# Patient Record
Sex: Male | Born: 1945
Health system: Southern US, Community
[De-identification: ages and names within clinical notes are randomized; demographics above are authoritative.]

## PROBLEM LIST (undated history)

## (undated) DIAGNOSIS — E785 Hyperlipidemia, unspecified: Secondary | ICD-10-CM

## (undated) DIAGNOSIS — W19XXXA Unspecified fall, initial encounter: Secondary | ICD-10-CM

## (undated) DIAGNOSIS — K922 Gastrointestinal hemorrhage, unspecified: Secondary | ICD-10-CM

## (undated) DIAGNOSIS — Z972 Presence of dental prosthetic device (complete) (partial): Secondary | ICD-10-CM

## (undated) DIAGNOSIS — J449 Chronic obstructive pulmonary disease, unspecified: Secondary | ICD-10-CM

## (undated) DIAGNOSIS — I5189 Other ill-defined heart diseases: Secondary | ICD-10-CM

## (undated) DIAGNOSIS — S2509XA Other specified injury of thoracic aorta, initial encounter: Secondary | ICD-10-CM

## (undated) DIAGNOSIS — K851 Biliary acute pancreatitis without necrosis or infection: Secondary | ICD-10-CM

## (undated) DIAGNOSIS — F329 Major depressive disorder, single episode, unspecified: Secondary | ICD-10-CM

## (undated) DIAGNOSIS — C4491 Basal cell carcinoma of skin, unspecified: Secondary | ICD-10-CM

## (undated) DIAGNOSIS — N529 Male erectile dysfunction, unspecified: Secondary | ICD-10-CM

## (undated) DIAGNOSIS — E669 Obesity, unspecified: Secondary | ICD-10-CM

## (undated) DIAGNOSIS — E119 Type 2 diabetes mellitus without complications: Secondary | ICD-10-CM

## (undated) DIAGNOSIS — I10 Essential (primary) hypertension: Secondary | ICD-10-CM

## (undated) DIAGNOSIS — S52132A Displaced fracture of neck of left radius, initial encounter for closed fracture: Secondary | ICD-10-CM

## (undated) DIAGNOSIS — I2510 Atherosclerotic heart disease of native coronary artery without angina pectoris: Secondary | ICD-10-CM

## (undated) DIAGNOSIS — I739 Peripheral vascular disease, unspecified: Secondary | ICD-10-CM

## (undated) DIAGNOSIS — S72002A Fracture of unspecified part of neck of left femur, initial encounter for closed fracture: Secondary | ICD-10-CM

## (undated) DIAGNOSIS — Z87448 Personal history of other diseases of urinary system: Secondary | ICD-10-CM

## (undated) DIAGNOSIS — K259 Gastric ulcer, unspecified as acute or chronic, without hemorrhage or perforation: Secondary | ICD-10-CM

## (undated) DIAGNOSIS — F32A Depression, unspecified: Secondary | ICD-10-CM

## (undated) HISTORY — DX: Basal cell carcinoma of skin, unspecified: C44.91

## (undated) HISTORY — DX: Gastrointestinal hemorrhage, unspecified: K92.2

## (undated) HISTORY — DX: Depression, unspecified: F32.A

## (undated) HISTORY — DX: Male erectile dysfunction, unspecified: N52.9

## (undated) HISTORY — DX: Obesity, unspecified: E66.9

## (undated) HISTORY — DX: Essential (primary) hypertension: I10

## (undated) HISTORY — DX: Peripheral vascular disease, unspecified: I73.9

## (undated) HISTORY — DX: Other specified injury of thoracic aorta, initial encounter: S25.09XA

## (undated) HISTORY — PX: CHOLECYSTECTOMY: SHX55

## (undated) HISTORY — PX: CARDIOVASCULAR SURGERY: SHX460

## (undated) HISTORY — DX: Biliary acute pancreatitis without necrosis or infection: K85.10

## (undated) HISTORY — DX: Displaced fracture of neck of left radius, initial encounter for closed fracture: S52.132A

## (undated) HISTORY — DX: Major depressive disorder, single episode, unspecified: F32.9

## (undated) HISTORY — DX: Hyperlipidemia, unspecified: E78.5

## (undated) HISTORY — PX: TRACHEOSTOMY: SUR1362

## (undated) HISTORY — DX: Fracture of unspecified part of neck of left femur, initial encounter for closed fracture: S72.002A

## (undated) HISTORY — DX: Unspecified fall, initial encounter: W19.XXXA

## (undated) HISTORY — DX: Other ill-defined heart diseases: I51.89

## (undated) HISTORY — DX: Personal history of other diseases of urinary system: Z87.448

## (undated) HISTORY — PX: KNEE SURGERY: SHX244

## (undated) HISTORY — DX: Type 2 diabetes mellitus without complications: E11.9

## (undated) HISTORY — DX: Atherosclerotic heart disease of native coronary artery without angina pectoris: I25.10

## (undated) HISTORY — PX: THORACOTOMY: SUR1349

---

## 1990-11-30 DIAGNOSIS — I2510 Atherosclerotic heart disease of native coronary artery without angina pectoris: Secondary | ICD-10-CM | POA: Diagnosis present

## 1990-11-30 HISTORY — DX: Atherosclerotic heart disease of native coronary artery without angina pectoris: I25.10

## 1991-08-31 HISTORY — PX: CARDIAC CATHETERIZATION: SHX172

## 2003-12-01 DIAGNOSIS — W19XXXA Unspecified fall, initial encounter: Secondary | ICD-10-CM

## 2003-12-01 DIAGNOSIS — S2509XA Other specified injury of thoracic aorta, initial encounter: Secondary | ICD-10-CM

## 2003-12-01 HISTORY — DX: Unspecified fall, initial encounter: W19.XXXA

## 2003-12-01 HISTORY — DX: Other specified injury of thoracic aorta, initial encounter: S25.09XA

## 2005-10-02 ENCOUNTER — Encounter: Payer: Self-pay | Admitting: Family Medicine

## 2005-10-07 ENCOUNTER — Encounter: Payer: Self-pay | Admitting: Family Medicine

## 2005-11-18 ENCOUNTER — Encounter: Payer: Self-pay | Admitting: Family Medicine

## 2008-05-12 ENCOUNTER — Other Ambulatory Visit: Payer: Self-pay

## 2008-05-12 ENCOUNTER — Inpatient Hospital Stay: Payer: Self-pay | Admitting: Vascular Surgery

## 2008-08-16 ENCOUNTER — Ambulatory Visit: Payer: Self-pay | Admitting: Family Medicine

## 2008-08-16 DIAGNOSIS — I2510 Atherosclerotic heart disease of native coronary artery without angina pectoris: Secondary | ICD-10-CM | POA: Insufficient documentation

## 2008-08-16 DIAGNOSIS — E785 Hyperlipidemia, unspecified: Secondary | ICD-10-CM | POA: Insufficient documentation

## 2008-08-16 DIAGNOSIS — F3340 Major depressive disorder, recurrent, in remission, unspecified: Secondary | ICD-10-CM | POA: Insufficient documentation

## 2008-08-16 DIAGNOSIS — F528 Other sexual dysfunction not due to a substance or known physiological condition: Secondary | ICD-10-CM | POA: Insufficient documentation

## 2008-08-16 DIAGNOSIS — I10 Essential (primary) hypertension: Secondary | ICD-10-CM | POA: Insufficient documentation

## 2008-08-17 LAB — CONVERTED CEMR LAB
ALT: 23 U/L
AST: 20 U/L
Albumin: 4.7 g/dL
Alkaline Phosphatase: 66 U/L
BUN: 13 mg/dL
Bilirubin, Direct: 0.2 mg/dL
CO2: 31 meq/L
Calcium: 9.8 mg/dL
Chloride: 102 meq/L
Cholesterol: 240 mg/dL
Creatinine, Ser: 1.1 mg/dL
Direct LDL: 181.7 mg/dL
GFR calc Af Amer: 88 mL/min
GFR calc non Af Amer: 72 mL/min
Glucose, Bld: 131 mg/dL — ABNORMAL HIGH
HDL: 40.8 mg/dL
PSA: 1.32 ng/mL
Potassium: 4.3 meq/L
Sodium: 139 meq/L
Total Bilirubin: 1.6 mg/dL — ABNORMAL HIGH
Total CHOL/HDL Ratio: 5.9
Total Protein: 7.8 g/dL
Triglycerides: 174 mg/dL — ABNORMAL HIGH
VLDL: 35 mg/dL

## 2008-09-14 ENCOUNTER — Telehealth (INDEPENDENT_AMBULATORY_CARE_PROVIDER_SITE_OTHER): Payer: Self-pay | Admitting: *Deleted

## 2010-01-23 ENCOUNTER — Telehealth (INDEPENDENT_AMBULATORY_CARE_PROVIDER_SITE_OTHER): Payer: Self-pay | Admitting: *Deleted

## 2010-02-07 ENCOUNTER — Ambulatory Visit: Payer: Self-pay | Admitting: Family Medicine

## 2010-02-10 LAB — CONVERTED CEMR LAB
ALT: 29 U/L
AST: 22 U/L
Albumin: 4.8 g/dL
Alkaline Phosphatase: 69 U/L
BUN: 18 mg/dL
Basophils Absolute: 0 10*3/uL
Basophils Relative: 0.7 %
Bilirubin, Direct: 0.1 mg/dL
CO2: 30 meq/L
Calcium: 9.8 mg/dL
Chloride: 106 meq/L
Cholesterol: 212 mg/dL — ABNORMAL HIGH
Creatinine, Ser: 1 mg/dL
Direct LDL: 160.4 mg/dL
Eosinophils Absolute: 0.1 10*3/uL
Eosinophils Relative: 2.5 %
GFR calc non Af Amer: 80.29 mL/min
Glucose, Bld: 168 mg/dL — ABNORMAL HIGH
HCT: 50.1 %
HDL: 37.6 mg/dL — ABNORMAL LOW
Hemoglobin: 17.1 g/dL — ABNORMAL HIGH
Lymphocytes Relative: 25.9 %
Lymphs Abs: 1.3 10*3/uL
MCHC: 34.2 g/dL
MCV: 89.5 fL
Monocytes Absolute: 0.5 10*3/uL
Monocytes Relative: 9.1 %
Neutro Abs: 3.2 10*3/uL
Neutrophils Relative %: 61.8 %
PSA: 1.17 ng/mL
Platelets: 207 10*3/uL
Potassium: 4.3 meq/L
RBC: 5.6 M/uL
RDW: 12.2 %
Sodium: 141 meq/L
Total Bilirubin: 1.2 mg/dL
Total CHOL/HDL Ratio: 6
Total Protein: 8 g/dL
Triglycerides: 92 mg/dL
VLDL: 18.4 mg/dL
WBC: 5.1 10*3/uL

## 2010-02-13 ENCOUNTER — Ambulatory Visit: Payer: Self-pay | Admitting: Family Medicine

## 2010-02-13 DIAGNOSIS — E1151 Type 2 diabetes mellitus with diabetic peripheral angiopathy without gangrene: Secondary | ICD-10-CM | POA: Insufficient documentation

## 2010-02-17 ENCOUNTER — Encounter: Payer: Self-pay | Admitting: Family Medicine

## 2010-02-20 ENCOUNTER — Encounter (INDEPENDENT_AMBULATORY_CARE_PROVIDER_SITE_OTHER): Payer: Self-pay | Admitting: *Deleted

## 2010-03-17 ENCOUNTER — Ambulatory Visit: Payer: Self-pay | Admitting: Family Medicine

## 2010-03-17 ENCOUNTER — Telehealth: Payer: Self-pay | Admitting: Family Medicine

## 2010-12-30 NOTE — Letter (Signed)
Summary: [facility]: Immunoassay Fecal Occult Blood (iFOB) Order Form  [facility]  [address] [place], [place] [address]   Phone: [phone]  Fax: [phone]      [facility]: Immunoassay Fecal Occult Blood (iFOB) Order Form   February 07, 2010 MRN: [id number]   [facility] Feb 15, 1946   Physicican Name:________Spencer Copland_________________  Diagnosis Code:________v76.49__________________      Hannah Beat MD

## 2010-12-30 NOTE — Assessment & Plan Note (Signed)
Summary: 30 MINUTE F/U PER DR. Nikelle Malatesta/NT   Vital Signs:  Patient profile:   65 year old male Height:      68.25 inches Weight:      230 pounds BMI:     34.84 Temp:     98 degrees F oral Pulse rate:   76 / minute Pulse rhythm:   regular BP sitting:   130 / 86 Cuff size:   regular  History of Present Illness: Chief complaint Diabetes.Heather M Woodard CMA  LDL, 160: on gem  DM: wife has type two diabetes. New onset DM, reviewed with him his recent labs.  Diabetes Management History:      He has not been enrolled in the "Diabetic Education Program".  He states understanding of dietary principles but he is not following the appropriate diet.  No sensory loss is reported.  Self foot exams are not being performed.  He is checking home blood sugars.  He says that he is not exercising regularly.        Hypoglycemic symptoms are not occurring.  No hyperglycemic symptoms are reported.        The following changes have been made to his treatment plan since last visit: diet changes, medication changes, and exercise program.  Treatment plan changes were initiated by patient and initiated by MD.    Current Problems (verified): 1)  Diabetes Mellitus, Type II  (ICD-250.00) 2)  Health Maintenance Exam  (ICD-V70.0) 3)  Special Screening Malignant Neoplasm of Prostate  (ICD-V76.44) 4)  Erectile Dysfunction  (ICD-302.72) 5)  Special Screening Malignant Neoplasm of Prostate  (ICD-V76.44) 6)  Encounter For Long-term Use of Other Medications  (ICD-V58.69) 7)  Family History Diabetes 1st Degree Relative  (ICD-V18.0) 8)  Hypertension  (ICD-401.9) 9)  Hyperlipidemia  (ICD-272.4) 10)  Depression  (ICD-311) 11)  Coronary Artery Disease  (ICD-414.00)  Allergies: 1)  ! Penicillin  Past History:  Past medical, surgical, family and social histories (including risk factors) reviewed, and no changes noted (except as noted below).  Past Medical History: Coronary artery disease, MI 1992, Acute anterior  MI, thrombolytic therapy. 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 Cholecystectomy, emergently Aorta, Cardiovascular Surgery with ruptured Aorta, Dr. Meyer Russel, [facility] R knee surgery s/p L Thoracotomy, thoracic aorta repair s/p Tracheostomy  Cardiac Catheterization, 08/1991, 50% mid-LAD stenosis with clot, 25-50% second marginal. Further stress tests, cardiolites neg. Stress Echo 09/2005, negative for ischemia [facility])   Dr. Corena Herter new partner did Gallbladder  Family History: Reviewed history from 08/16/2008 and no changes required. Family History Diabetes 1st degree relative Family History High cholesterol Family History Hypertension  Distanct CAD  Mom - 30, dementia Father, MI, 59  Brother, d/c 28, DM Brother, has had an MI  Social History: Reviewed history from 08/16/2008 and no changes required. 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 GI: No n/v/d Pulm: No SOB, cough, wheezing Interactive and getting along well at home.  Otherwise, ROS is as per the HPI.   Physical Exam  General:  Well-developed,well-nourished,in no acute distress; alert,appropriate and cooperative throughout examination Head:  Normocephalic and atraumatic without obvious abnormalities. No apparent alopecia or balding. Mouth:  Oral mucosa and oropharynx without lesions or exudates.   Teeth in good repair. Lungs:  Normal respiratory effort, chest expands symmetrically. Lungs are clear to auscultation, no crackles or wheezes. Heart:  Normal rate and regular rhythm. S1 and S2 normal without gallop, murmur, click, rub or other extra sounds. Psych:  Cognition and judgment appear intact. Alert and cooperative with normal attention span and concentration. No apparent delusions, illusions, hallucinations   Impression & Recommendations:  Problem # 1:  DIABETES MELLITUS, TYPE II (ICD-250.00) Assessment New New onset DM No symptoms Cont with Metformin, reviewed DM in detail  The following medications were removed from the medication list:    Lisinopril-hydrochlorothiazide 20-12.5 Mg Tabs (Lisinopril-hydrochlorothiazide) .[name] Kitchen... 1 by mouth daily His updated medication list for this problem includes:    Adprin B 325 Mg Tabs (Aspirin buf(cacarb-mgcarb-mgo)) .[name] Kitchen... Take one tablet daily    Lisinopril-hydrochlorothiazide 20-12.5 Mg Tabs (Lisinopril-hydrochlorothiazide) .[name] Kitchen... 2 tabs by mouth daily    Metformin Hcl 500 Mg Xr24h-tab (Metformin hcl) .[name] Kitchen... 1 by mouth daily  Problem # 2:  HYPERLIPIDEMIA (ICD-272.4) Assessment: Deteriorated not at goal, rechallenge with Pravachol  His updated medication list for this problem includes:    Gemfibrozil 600 Mg Tabs (Gemfibrozil) .[name] Kitchen... Take one tablet two times a day    Pravastatin Sodium 40 Mg Tabs (Pravastatin sodium) .[name] Kitchen... 1 by mouth at bedtime  Labs Reviewed: SGOT: 22 (02/07/2010)   SGPT: 29 (02/07/2010)   HDL:37.60 (02/07/2010), 40.8 (08/16/2008)  LDL:DEL (08/16/2008)  Chol:212 (02/07/2010), 240 (08/16/2008)  Trig:92.0 (02/07/2010), 174 (08/16/2008)  Complete Medication List: 1)  Citalopram Hydrobromide 20 Mg Tabs (Citalopram hydrobromide) .... Take one tablet daily 2)  Amlodipine Besylate 10 Mg Tabs (Amlodipine besylate) .... Once daily 3)  Adprin B 325 Mg Tabs (Aspirin buf(cacarb-mgcarb-mgo)) .... Take one tablet daily 4)  Levitra  20 Mg Tabs (Vardenafil hcl) .[name] Kitchen.. 1 by mouth 30 minutes before intercourse 5)  Gemfibrozil 600 Mg Tabs (Gemfibrozil) .... Take one tablet two times a day 6)  Lisinopril-hydrochlorothiazide 20-12.5 Mg Tabs (Lisinopril-hydrochlorothiazide) .... 2 tabs by mouth daily 7)  Pravastatin Sodium 40 Mg Tabs (Pravastatin sodium) .[name] Kitchen.. 1 by mouth at bedtime 8)  Metformin Hcl 500 Mg Xr24h-tab (Metformin hcl) .[name] Kitchen.. 1 by mouth daily  Diabetes Management Assessment/Plan:      The following lipid goals have been established for the patient: Total cholesterol goal of 200; LDL cholesterol goal of 100; HDL cholesterol goal of 40; Triglyceride goal of 200.    Patient Instructions: 1)  f/u 3 months, labs before 2)  code = 250.00 for all 3)  Hepatic Panel prior to visit ICD-9:  4)  Lipid panel prior to visit ICD-9 :  5)  HgBA1c prior to visit  ICD-9:  6)  Urine Microalbumin prior to visit ICD-9 :  Prescriptions: PRAVASTATIN SODIUM 40 MG TABS (PRAVASTATIN SODIUM) 1 by mouth at bedtime  #30 x 11   Entered and Authorized by:   Hannah Beat MD   Signed by:   Hannah Beat MD on 02/13/2010   Method used:   Electronically to        [facility]* (retail)       [address]  [place], [place]  [address]       Ph: [phone]       Fax: [phone]   RxID:   [id number] LISINOPRIL-HYDROCHLOROTHIAZIDE 20-12.5 MG TABS (LISINOPRIL-HYDROCHLOROTHIAZIDE) 2 tabs by mouth daily  #60 x 3   Entered and Authorized by:   Hannah Beat MD   Signed by:   Hannah Beat MD on 02/13/2010   Method used:   Print then Give to Patient   RxID:   [id number]   Current Allergies (reviewed today): ! PENICILLIN

## 2010-12-30 NOTE — Progress Notes (Signed)
  Phone Note From Other Clinic   Caller: [facility] Call For: [name] Summary of Call: ifob test never received, test canceled. Initial call taken by: Mills Koller,  March 17, 2010 2:28 PM

## 2010-12-30 NOTE — Consult Note (Signed)
 Summary: [facility] - ANNUAL EXAM / DR. ANDREW LAMB  [facility] - ANNUAL EXAM / DR. ANDREW LAMB   Imported By: Rock Nottingham 08/16/2008 16:34:19  _____________________________________________________________________  External Attachment:    Type:   Image     Comment:   External Document

## 2010-12-30 NOTE — Progress Notes (Signed)
Summary: refill request for norvasc denied- needs office visit  Phone Note Outgoing Call   Summary of Call: Refill request for norvasc denied to [facility] pharmacy, pt has not been seen in over a year. Initial call taken by: Lowella Petties CMA,  January 23, 2010 10:13 AM

## 2010-12-30 NOTE — Miscellaneous (Signed)
  Clinical Lists Changes        Diabetic Eye Exam Date:  02/20/2010 Diabetes Eye Exam Due:  1 yr

## 2010-12-30 NOTE — Letter (Signed)
[facility]   Imported By: Lanelle Bal 02/20/2010 12:50:11  _____________________________________________________________________  External Attachment:    Type:   Image     Comment:   External Document  Appended Document: [facility] document DM eye exam today  Appended Document: [facility] done and to repeat in 1 year

## 2010-12-30 NOTE — Assessment & Plan Note (Signed)
 Summary: TO EST/CPX/HEA   Vital Signs:  Patient Profile:   65 Years Old Male Height:     70 inches (177.80 cm) Weight:      541.38 pounds (246.08 kg) Temp:     98.3 degrees F (36.83 degrees C) oral Pulse rate:   60 / minute Pulse rhythm:   regular BP sitting:   150 / 90  (left arm) Cuff size:   large  Vitals Entered By: Roxanne Ruth (August 16, 2008 9:00 AM)                 Chief Complaint:  New pt/physical.  History of Present Illness: To establish Care today in our office. He has not had a physical in 2-3 years.  1. HTN: 150/90 today. Taking a full dose aspirin . medications are listed below. He has been on them since approximately mid 1990s. He reports compliance and no complications.  2. ED: patient does have difficulty keeping and maintaining an erection. He is not able to achieve penetration. He does occasionally have erections, but these are not maintained or satisfactory.  3. Hyperlipidemia, not currently on meds, statins have caused joint pains in the past.  He does not have a problem with his stream or urinary difficulties at this time.  4. Colon CA screening, No colonoscopy,  patient has not had one, and has declined in the past due to financial issues.  5. Screening, prostate CA: check PSA, no h/o elevations, no h/o CA or known BPH   Dr. Baruch from [facility] has been his prior physician.    Current Allergies: ! PENICILLIN  Past Medical History:    Coronary artery disease, ? MI 1992 - will review records, no stent, no CABG    Depression    Hyperlipidemia, stating with joint pains    Hypertension    Blood clot, main artery in heart, 1992, placed on heparin , etc.    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 Camellia Larsen, [facility]            Dr. 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   Eyes:     pupils equal, pupils round,  pupils reactive to light, pupils react to accomodation, no injection, no iris abnormalities, no optic disk abnormalities, and no nystagmus.   Ears:     External ear exam shows no significant lesions or deformities.  Otoscopic examination reveals clear canals, tympanic membranes are intact bilaterally without bulging, retraction, inflammation or discharge. Hearing is grossly normal bilaterally. Nose:     no external deformity.   Mouth:     Oral mucosa and oropharynx without lesions or exudates.  Teeth in good repair. Neck:     No deformities, masses, or tenderness noted. Lungs:     Normal respiratory effort, chest expands symmetrically. Lungs are clear to auscultation, no crackles or wheezes. Heart:     Normal rate and regular rhythm. S1 and S2 normal without gallop, murmur, click, rub or other extra sounds. Abdomen:     Bowel sounds positive,abdomen soft and non-tender without masses, organomegaly or hernias noted. Obesity. Rectal:     No external abnormalities noted. Normal sphincter tone. No rectal masses or tenderness. Genitalia:     Testes bilaterally descended without nodularity, tenderness or masses. No  scrotal masses or lesions. No penis lesions or urethral discharge. uncircumcised.   Prostate:     Prostate gland firm and smooth, no enlargement, nodularity, tenderness, mass, asymmetry or induration. Msk:     normal ROM.   Pulses:     R and Ldorsalis pedis and posterior tibial pulses are full and equal bilaterally Extremities:     No clubbing, cyanosis, edema, or deformity noted with normal full range of motion of all joints.   Neurologic:     alert & oriented X3 and gait normal.   Skin:     no rashes.   Cervical Nodes:     No lymphadenopathy noted Psych:     Cognition and judgment appear intact. Alert and cooperative with normal attention span and concentration. No apparent delusions, illusions, hallucinations    Impression & Recommendations:  Problem # 1:  HYPERTENSION (ICD-401.9) Assessment: New Increase Norvasc , recheck in 1 month.  I recommended a colonoscopy, but the patient declined.  His updated medication list for this problem includes:    Amlodipine  Besylate 10 Mg Tabs (Amlodipine  besylate) ..... Once daily    Hydrochlorothiazide  25 Mg Tabs (Hydrochlorothiazide ) .SABRA... Take one tablet daily   Problem # 2:  HYPERLIPIDEMIA (ICD-272.4) Assessment: New  Orders: Venipuncture (63584) TLB-Lipid Panel (80061-LIPID)   Problem # 3:  DEPRESSION (ICD-311) Assessment: New Stable  His updated medication list for this problem includes:    Citalopram  Hydrobromide 20 Mg Tabs (Citalopram  hydrobromide) .SABRA... Take one tablet daily   Problem # 4:  ERECTILE DYSFUNCTION (ICD-302.72) Assessment: New f/u 1 month  His updated medication list for this problem includes:    Viagra 50 Mg Tabs (Sildenafil citrate) .SABRA... 1 by mouth 1 hour prior to intercourse   Problem # 5:  SPECIAL SCREENING MALIGNANT NEOPLASM OF PROSTATE (ICD-V76.44) Assessment: New DRE WNL  Orders: Venipuncture (63584) TLB-PSA (Prostate Specific Antigen) (84153-PSA)   Problem # 6:  ENCOUNTER FOR  LONG-TERM USE OF OTHER MEDICATIONS (ICD-V58.69) Assessment: New  Orders: Venipuncture (63584) TLB-BMP (Basic Metabolic Panel-BMET) (80048-METABOL) TLB-Hepatic/Liver Function Pnl (80076-HEPATIC)   Complete Medication List: 1)  Citalopram  Hydrobromide 20 Mg Tabs (Citalopram  hydrobromide) .... Take one tablet daily 2)  Amlodipine  Besylate 10 Mg Tabs (Amlodipine  besylate) .... Once daily 3)  Hydrochlorothiazide  25 Mg Tabs (Hydrochlorothiazide ) .... Take one tablet daily 4)  Adprin B 325 Mg Tabs (Aspirin  buf(cacarb-mgcarb-mgo)) .... Take one tablet daily 5)  Viagra 50 Mg Tabs (Sildenafil citrate) .SABRA.. 1 by mouth 1 hour prior to intercourse  Other Orders: Flu Vaccine 60yrs + (09341) Admin 1st Vaccine (09528)   Patient Instructions: 1)  Blood pressure medicine changed and called in to [facility] 2)  Please schedule a follow-up appointment in 1 month. 3)  Go to lab for bloodwork   Prescriptions: VIAGRA 50 MG TABS (SILDENAFIL CITRATE) 1 by mouth 1 hour prior to intercourse  #30 x 5   Entered and Authorized by:   Jacques Schroeder MD   Signed by:   Jacques Schroeder MD on 08/16/2008   Method used:   Electronically to        [facility]* (retail)       [address]       [place], [place]  [address]       Ph: [phone]       Fax: [phone]   RxID:   [id number] AMLODIPINE  BESYLATE 10 MG  TABS (AMLODIPINE  BESYLATE) once daily  #30 x 6   Entered and Authorized by:   Jacques Schroeder MD   Signed by:   Jacques Schroeder MD on 08/16/2008   Method used:   Electronically to        [facility]* (retail)       [address]       [place], [place]  [address]       Ph: [phone]       Fax: [phone]   RxID:   [id number]  ]  Influenza Vaccine    Vaccine Type: Fluvax 3+    Site: right deltoid    Mfr: GlaxoSmithKline    Dose: 0.5 ml    Route: IM    Given by: Roxanne Ruth    Exp. Date: 05/29/2009    Lot #: [id number]     VIS given: 06/23/07 version given August 16, 2008.  Flu Vaccine Consent Questions    Do you have a history of severe allergic reactions to this vaccine? no    Any prior history of allergic reactions to egg and/or gelatin? no    Do you have a sensitivity to the preservative Thimersol? no    Do you have a past history of Guillan-Barre Syndrome? no    Do you currently have an acute febrile illness? no    Have you ever had a severe reaction to latex? no    Vaccine information given and explained to patient? yes Review of  Systems General: denies fatigue, malaise, fever, weight loss Eyes: denies blurring, diplopia, irritation, discharge Ear/Nose/Throat: denies ear pain or discharge, nasal obstruction or discharge, sore throat Cardiovascular: denies chest pain, palpitations, paroxysmal nocturnal dyspnea, orthopnea, edema Respiratory: denies coughing, wheezing, dyspnea, hemoptysis Gastrointestinal: denies abdominal pain, dysphagia, nausea, vomiting, diarrhea, constipation, melena, BRBPR Genitourinary: denies hematuria, frequency, urgency, dysuria, discharge, incontinence, c/o impotence Musculoskeletal: denies back pain, joint swelling, joint stiffness, joint pain Skin: denies rashes, itching, lumps, sores, lesions, color change Neurologic: denies syncope, seizures, transient paralysis, weakness, paresthesias Psychiatric: denies depression, anxiety, mental disturbance, difficulty sleeping, suicidal ideation, hallucinations, paranoia - depression, mood swings doing well. Endocrine: denies polyuria, polydipsia, polyphagia, weight change, heat or cold intolerance Heme/Lymphatic: denies easy or excessive bruising, history of blood transfusions, anemia, bleeding disorders, adenopathy, chills, sweats Allergic/Immunologic: denies urticaria, hay fever, frequent UTIs; denies HIV high risk behaviors

## 2010-12-30 NOTE — Progress Notes (Signed)
 Summary: zetia too expensive  Phone Note Call from Patient Call back at [phone]   Caller: Patient Call For: dr copland Summary of Call: Pt was prescribed zetia, he states this is too expensive, asks if something generic can be called to [facility] pharmacy. Initial call taken by: Mitzie Amabile,  September 14, 2008 9:14 AM  Follow-up for Phone Call        Call in for him  Gemfibrozil  600 mg, 1 by mouth two times a day, #60, 5 refills  We should see him back in 2 months to cbeck his liver and see if he is tolerating OK. Sorry it took a few days to get back to him. Follow-up by: Jacques Schroeder MD,  September 19, 2008 8:25 AM    New/Updated Medications: GEMFIBROZIL  600 MG TABS (GEMFIBROZIL ) take one tablet two times a day   Prescriptions: GEMFIBROZIL  600 MG TABS (GEMFIBROZIL ) take one tablet two times a day  #60 x 5   Entered by:   Roxanne Ruth   Authorized by:   Jacques Schroeder MD   Signed by:   Roxanne Ruth on 09/19/2008   Method used:   Telephoned to ...       [facility]* (retail)       [address]       [place], [place]  [address]       Ph: [phone]       Fax: [phone]   RxID:   [id number]   Current Allergies (reviewed today): ! PENICILLIN

## 2010-12-30 NOTE — Consult Note (Signed)
 Summary: [facility] - STRESS ECHOCARDIOGRAM / DR. ANDREW LAMB  [facility] - STRESS ECHOCARDIOGRAM / DR. ANDREW LAMB   Imported By: Rock Nottingham 08/16/2008 16:35:30  _____________________________________________________________________  External Attachment:    Type:   Image     Comment:   External Document
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
## 2010-12-30 NOTE — Consult Note (Signed)
 Summary: [facility] - RECHECK / DR. ANDREW LAMB  [facility] - RECHECK / DR. ANDREW LAMB   Imported By: Rock Nottingham 08/16/2008 16:36:28  _____________________________________________________________________  External Attachment:    Type:   Image     Comment:   External Document

## 2011-02-23 ENCOUNTER — Other Ambulatory Visit: Payer: Self-pay | Admitting: Family Medicine

## 2011-02-26 ENCOUNTER — Other Ambulatory Visit (INDEPENDENT_AMBULATORY_CARE_PROVIDER_SITE_OTHER): Payer: Self-pay | Admitting: Family Medicine

## 2011-02-26 ENCOUNTER — Other Ambulatory Visit: Payer: Self-pay | Admitting: Family Medicine

## 2011-02-26 DIAGNOSIS — E785 Hyperlipidemia, unspecified: Secondary | ICD-10-CM

## 2011-02-26 DIAGNOSIS — Z125 Encounter for screening for malignant neoplasm of prostate: Secondary | ICD-10-CM

## 2011-02-26 DIAGNOSIS — E119 Type 2 diabetes mellitus without complications: Secondary | ICD-10-CM

## 2011-02-26 DIAGNOSIS — Z79899 Other long term (current) drug therapy: Secondary | ICD-10-CM

## 2011-02-26 LAB — BASIC METABOLIC PANEL WITH GFR
BUN: 13 mg/dL (ref 6–23)
CO2: 27 meq/L (ref 19–32)
Calcium: 9.4 mg/dL (ref 8.4–10.5)
Chloride: 107 meq/L (ref 96–112)
Creatinine, Ser: 1.1 mg/dL (ref 0.4–1.5)
GFR: 74.81 mL/min
Glucose, Bld: 109 mg/dL — ABNORMAL HIGH (ref 70–99)
Potassium: 5.4 meq/L — ABNORMAL HIGH (ref 3.5–5.1)
Sodium: 139 meq/L (ref 135–145)

## 2011-02-26 LAB — CBC WITH DIFFERENTIAL/PLATELET
Basophils Absolute: 0.1 10*3/uL (ref 0.0–0.1)
Basophils Relative: 1 % (ref 0.0–3.0)
Eosinophils Absolute: 0.1 10*3/uL (ref 0.0–0.7)
Eosinophils Relative: 2.4 % (ref 0.0–5.0)
HCT: 45.5 % (ref 39.0–52.0)
Hemoglobin: 15.8 g/dL (ref 13.0–17.0)
Lymphocytes Relative: 28 % (ref 12.0–46.0)
Lymphs Abs: 1.7 10*3/uL (ref 0.7–4.0)
MCHC: 34.7 g/dL (ref 30.0–36.0)
MCV: 87.7 fl (ref 78.0–100.0)
Monocytes Absolute: 0.4 10*3/uL (ref 0.1–1.0)
Monocytes Relative: 7.2 % (ref 3.0–12.0)
Neutro Abs: 3.6 10*3/uL (ref 1.4–7.7)
Neutrophils Relative %: 61.4 % (ref 43.0–77.0)
Platelets: 209 10*3/uL (ref 150.0–400.0)
RBC: 5.19 Mil/uL (ref 4.22–5.81)
RDW: 13.5 % (ref 11.5–14.6)
WBC: 5.9 10*3/uL (ref 4.5–10.5)

## 2011-02-26 LAB — MICROALBUMIN / CREATININE URINE RATIO
Creatinine,U: 154.7 mg/dL
Microalb Creat Ratio: 3.7 mg/g (ref 0.0–30.0)

## 2011-02-26 LAB — LIPID PANEL
Cholesterol: 242 mg/dL — ABNORMAL HIGH (ref 0–200)
HDL: 40.8 mg/dL
Total CHOL/HDL Ratio: 6
Triglycerides: 119 mg/dL (ref 0.0–149.0)
VLDL: 23.8 mg/dL (ref 0.0–40.0)

## 2011-02-26 LAB — HEPATIC FUNCTION PANEL
ALT: 20 U/L (ref 0–53)
AST: 18 U/L (ref 0–37)
Albumin: 4.7 g/dL (ref 3.5–5.2)
Alkaline Phosphatase: 72 U/L (ref 39–117)
Bilirubin, Direct: 0.2 mg/dL (ref 0.0–0.3)
Total Bilirubin: 1.2 mg/dL (ref 0.3–1.2)
Total Protein: 7.3 g/dL (ref 6.0–8.3)

## 2011-02-26 LAB — HEMOGLOBIN A1C: Hgb A1c MFr Bld: 5.8 % (ref 4.6–6.5)

## 2011-02-26 LAB — LDL CHOLESTEROL, DIRECT: Direct LDL: 185.4 mg/dL

## 2011-02-26 LAB — PSA: PSA: 1.71 ng/mL (ref 0.10–4.00)

## 2011-03-03 ENCOUNTER — Encounter: Payer: Self-pay | Admitting: *Deleted

## 2011-03-04 ENCOUNTER — Ambulatory Visit (INDEPENDENT_AMBULATORY_CARE_PROVIDER_SITE_OTHER): Payer: Self-pay | Admitting: Family Medicine

## 2011-03-04 ENCOUNTER — Encounter: Payer: Self-pay | Admitting: Family Medicine

## 2011-03-04 VITALS — BP 150/80 | HR 72 | Temp 98.6°F | Ht 70.0 in | Wt 229.8 lb

## 2011-03-04 DIAGNOSIS — Z1211 Encounter for screening for malignant neoplasm of colon: Secondary | ICD-10-CM

## 2011-03-04 DIAGNOSIS — Z0000 Encounter for general adult medical examination without abnormal findings: Secondary | ICD-10-CM | POA: Insufficient documentation

## 2011-03-04 MED ORDER — VARDENAFIL HCL 20 MG PO TABS: 20.0000 mg | ORAL_TABLET | Freq: Every day | ORAL | Status: DC | PRN

## 2011-03-04 MED ORDER — CITALOPRAM HYDROBROMIDE 20 MG PO TABS: 20.0000 mg | ORAL_TABLET | Freq: Every day | ORAL | Status: DC

## 2011-03-04 MED ORDER — AMLODIPINE BESYLATE 10 MG PO TABS: 10.0000 mg | ORAL_TABLET | Freq: Every day | ORAL | Status: DC

## 2011-03-04 MED ORDER — METFORMIN HCL ER (OSM) 500 MG PO TB24: 500.0000 mg | ORAL_TABLET | Freq: Every day | ORAL | Status: DC

## 2011-03-04 MED ORDER — GEMFIBROZIL 600 MG PO TABS: 600.0000 mg | ORAL_TABLET | Freq: Two times a day (BID) | ORAL | Status: DC

## 2011-03-04 MED ORDER — PRAVASTATIN SODIUM 40 MG PO TABS: 40.0000 mg | ORAL_TABLET | Freq: Every day | ORAL | Status: DC

## 2011-03-04 MED ORDER — LISINOPRIL-HYDROCHLOROTHIAZIDE 20-12.5 MG PO TABS: 2.0000 | ORAL_TABLET | Freq: Every day | ORAL | Status: DC
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
## 2011-04-22 ENCOUNTER — Inpatient Hospital Stay: Payer: Self-pay | Admitting: Internal Medicine

## 2011-04-27 LAB — PATHOLOGY REPORT

## 2011-04-30 ENCOUNTER — Encounter: Payer: Self-pay | Admitting: Family Medicine

## 2011-04-30 ENCOUNTER — Ambulatory Visit (INDEPENDENT_AMBULATORY_CARE_PROVIDER_SITE_OTHER): Payer: Self-pay | Admitting: Family Medicine

## 2011-04-30 VITALS — BP 140/72 | HR 75 | Temp 97.7°F | Ht 70.0 in | Wt 228.8 lb

## 2011-04-30 DIAGNOSIS — E119 Type 2 diabetes mellitus without complications: Secondary | ICD-10-CM

## 2011-04-30 DIAGNOSIS — K264 Chronic or unspecified duodenal ulcer with hemorrhage: Secondary | ICD-10-CM

## 2011-04-30 DIAGNOSIS — D62 Acute posthemorrhagic anemia: Secondary | ICD-10-CM | POA: Insufficient documentation

## 2011-04-30 MED ORDER — LISINOPRIL 20 MG PO TABS: 20.0000 mg | ORAL_TABLET | Freq: Every day | ORAL | Status: DC

## 2011-04-30 NOTE — Patient Instructions (Signed)
DO NOT TAKE ANY ASPIRIN OR ANTI-INFLAMMATORIES (ADVIL, ALLEVE) AT ALL  Check your blood pressure every other day and write them down on a piece of paper.  Recheck in 1 month
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
## 2011-05-01 LAB — CBC WITH DIFFERENTIAL/PLATELET
Basophils Absolute: 0 10*3/uL (ref 0.0–0.1)
Basophils Relative: 0.3 % (ref 0.0–3.0)
Eosinophils Absolute: 0.4 10*3/uL (ref 0.0–0.7)
Eosinophils Relative: 6.3 % — ABNORMAL HIGH (ref 0.0–5.0)
HCT: 26.3 % — ABNORMAL LOW (ref 39.0–52.0)
Hemoglobin: 9.1 g/dL — ABNORMAL LOW (ref 13.0–17.0)
Lymphocytes Relative: 28.9 % (ref 12.0–46.0)
Lymphs Abs: 1.7 10*3/uL (ref 0.7–4.0)
MCHC: 34.6 g/dL (ref 30.0–36.0)
MCV: 89.6 fl (ref 78.0–100.0)
Monocytes Absolute: 0.3 10*3/uL (ref 0.1–1.0)
Monocytes Relative: 4.4 % (ref 3.0–12.0)
Neutro Abs: 3.5 10*3/uL (ref 1.4–7.7)
Neutrophils Relative %: 60.1 % (ref 43.0–77.0)
Platelets: 395 10*3/uL (ref 150.0–400.0)
RBC: 2.94 Mil/uL — ABNORMAL LOW (ref 4.22–5.81)
RDW: 16 % — ABNORMAL HIGH (ref 11.5–14.6)
WBC: 5.8 10*3/uL (ref 4.5–10.5)

## 2011-05-26 ENCOUNTER — Other Ambulatory Visit: Payer: Self-pay

## 2011-05-27 ENCOUNTER — Encounter: Payer: Self-pay | Admitting: Family Medicine

## 2011-05-27 ENCOUNTER — Ambulatory Visit (INDEPENDENT_AMBULATORY_CARE_PROVIDER_SITE_OTHER): Payer: Self-pay | Admitting: Family Medicine

## 2011-05-27 VITALS — BP 140/90 | HR 96 | Temp 98.6°F | Ht 68.5 in | Wt 226.8 lb

## 2011-05-27 DIAGNOSIS — D62 Acute posthemorrhagic anemia: Secondary | ICD-10-CM

## 2011-05-27 LAB — CBC WITH DIFFERENTIAL/PLATELET
Basophils Absolute: 0 10*3/uL (ref 0.0–0.1)
Basophils Relative: 0.4 % (ref 0.0–3.0)
Eosinophils Absolute: 0.3 10*3/uL (ref 0.0–0.7)
Eosinophils Relative: 6.1 % — ABNORMAL HIGH (ref 0.0–5.0)
HCT: 43.4 % (ref 39.0–52.0)
Hemoglobin: 14.9 g/dL (ref 13.0–17.0)
Lymphocytes Relative: 30.7 % (ref 12.0–46.0)
Lymphs Abs: 1.5 10*3/uL (ref 0.7–4.0)
MCHC: 34.3 g/dL (ref 30.0–36.0)
MCV: 88 fl (ref 78.0–100.0)
Monocytes Absolute: 0.4 10*3/uL (ref 0.1–1.0)
Monocytes Relative: 8.5 % (ref 3.0–12.0)
Neutro Abs: 2.6 10*3/uL (ref 1.4–7.7)
Neutrophils Relative %: 54.3 % (ref 43.0–77.0)
Platelets: 159 10*3/uL (ref 150.0–400.0)
RBC: 4.93 Mil/uL (ref 4.22–5.81)
RDW: 14.5 % (ref 11.5–14.6)
WBC: 4.8 10*3/uL (ref 4.5–10.5)

## 2011-05-27 MED ORDER — LISINOPRIL 40 MG PO TABS: 40.0000 mg | ORAL_TABLET | Freq: Every day | ORAL | Status: DC
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
## 2011-05-27 NOTE — Patient Instructions (Signed)
Fu 6 mo

## 2011-06-08 ENCOUNTER — Telehealth: Payer: Self-pay | Admitting: Radiology

## 2011-06-08 NOTE — Telephone Encounter (Signed)
noted 

## 2011-06-08 NOTE — Telephone Encounter (Signed)
FYI [facility] cancelled ifob, no sample received. Patient was notified and said he would send in on 04/02/11. No sample was ever recieved

## 2011-11-26 ENCOUNTER — Ambulatory Visit: Payer: Self-pay | Admitting: Family Medicine

## 2012-03-22 ENCOUNTER — Other Ambulatory Visit: Payer: Self-pay | Admitting: Family Medicine

## 2012-04-26 ENCOUNTER — Other Ambulatory Visit: Payer: Self-pay | Admitting: *Deleted

## 2012-04-26 MED ORDER — METFORMIN HCL ER 500 MG PO TB24: ORAL_TABLET | ORAL | Status: DC

## 2012-04-26 NOTE — Telephone Encounter (Signed)
Patient has appt  05-26-2012

## 2012-05-23 ENCOUNTER — Telehealth: Payer: Self-pay | Admitting: Radiology

## 2012-05-23 ENCOUNTER — Ambulatory Visit (INDEPENDENT_AMBULATORY_CARE_PROVIDER_SITE_OTHER): Payer: Medicare Other | Admitting: Family Medicine

## 2012-05-23 ENCOUNTER — Encounter: Payer: Self-pay | Admitting: Family Medicine

## 2012-05-23 ENCOUNTER — Telehealth: Payer: Self-pay | Admitting: Family Medicine

## 2012-05-23 VITALS — BP 150/80 | HR 69 | Temp 98.4°F | Ht 68.5 in | Wt 231.8 lb

## 2012-05-23 DIAGNOSIS — R0602 Shortness of breath: Secondary | ICD-10-CM

## 2012-05-23 DIAGNOSIS — I2510 Atherosclerotic heart disease of native coronary artery without angina pectoris: Secondary | ICD-10-CM

## 2012-05-23 DIAGNOSIS — D62 Acute posthemorrhagic anemia: Secondary | ICD-10-CM

## 2012-05-23 DIAGNOSIS — R079 Chest pain, unspecified: Secondary | ICD-10-CM

## 2012-05-23 DIAGNOSIS — E785 Hyperlipidemia, unspecified: Secondary | ICD-10-CM

## 2012-05-23 DIAGNOSIS — Z79899 Other long term (current) drug therapy: Secondary | ICD-10-CM

## 2012-05-23 DIAGNOSIS — E119 Type 2 diabetes mellitus without complications: Secondary | ICD-10-CM

## 2012-05-23 LAB — BASIC METABOLIC PANEL WITH GFR
BUN: 17 mg/dL (ref 6–23)
CO2: 24 meq/L (ref 19–32)
Calcium: 9.7 mg/dL (ref 8.4–10.5)
Chloride: 107 meq/L (ref 96–112)
Creatinine, Ser: 1.1 mg/dL (ref 0.4–1.5)
GFR: 69.93 mL/min
Glucose, Bld: 114 mg/dL — ABNORMAL HIGH (ref 70–99)
Potassium: 6.1 meq/L (ref 3.5–5.1)
Sodium: 139 meq/L (ref 135–145)

## 2012-05-23 LAB — CBC WITH DIFFERENTIAL/PLATELET
Basophils Absolute: 0.1 10*3/uL (ref 0.0–0.1)
Basophils Relative: 1 % (ref 0.0–3.0)
Eosinophils Absolute: 0.1 10*3/uL (ref 0.0–0.7)
Eosinophils Relative: 1.9 % (ref 0.0–5.0)
HCT: 46.2 % (ref 39.0–52.0)
Hemoglobin: 15.8 g/dL (ref 13.0–17.0)
Lymphocytes Relative: 25.9 % (ref 12.0–46.0)
Lymphs Abs: 1.6 10*3/uL (ref 0.7–4.0)
MCHC: 34.3 g/dL (ref 30.0–36.0)
MCV: 88.2 fl (ref 78.0–100.0)
Monocytes Absolute: 0.4 10*3/uL (ref 0.1–1.0)
Monocytes Relative: 7.3 % (ref 3.0–12.0)
Neutro Abs: 3.9 10*3/uL (ref 1.4–7.7)
Neutrophils Relative %: 63.9 % (ref 43.0–77.0)
Platelets: 190 10*3/uL (ref 150.0–400.0)
RBC: 5.24 Mil/uL (ref 4.22–5.81)
RDW: 13 % (ref 11.5–14.6)
WBC: 6.2 10*3/uL (ref 4.5–10.5)

## 2012-05-23 LAB — HEPATIC FUNCTION PANEL
ALT: 20 U/L (ref 0–53)
AST: 21 U/L (ref 0–37)
Albumin: 4.9 g/dL (ref 3.5–5.2)
Alkaline Phosphatase: 54 U/L (ref 39–117)
Bilirubin, Direct: 0 mg/dL (ref 0.0–0.3)
Total Bilirubin: 1.1 mg/dL (ref 0.3–1.2)
Total Protein: 7.8 g/dL (ref 6.0–8.3)

## 2012-05-23 LAB — LDL CHOLESTEROL, DIRECT: Direct LDL: 173.5 mg/dL

## 2012-05-23 LAB — MICROALBUMIN / CREATININE URINE RATIO
Creatinine,U: 37.1 mg/dL
Microalb Creat Ratio: 4.8 mg/g (ref 0.0–30.0)

## 2012-05-23 LAB — HEMOGLOBIN A1C: Hgb A1c MFr Bld: 5.9 % (ref 4.6–6.5)

## 2012-05-23 MED ORDER — ALBUTEROL SULFATE (5 MG/ML) 0.5% IN NEBU
2.5000 mg | INHALATION_SOLUTION | Freq: Once | RESPIRATORY_TRACT | Status: AC
  Administered 2012-05-23: 2.5 mg via RESPIRATORY_TRACT

## 2012-05-23 MED ORDER — METFORMIN HCL ER 500 MG PO TB24: ORAL_TABLET | ORAL | Status: DC

## 2012-05-23 NOTE — Telephone Encounter (Signed)
[facility] called in

## 2012-05-23 NOTE — Telephone Encounter (Signed)
Caller: [name]/Patient; PCP: Hannah Beat T.; CB#: [phone]; ; ; Call regarding SOB;   Since  April 2013  has noticed feeling SOB with exertion. Activities  walking  up hill , washing car.  Never has chest pain, nor pressure. No problems with usual ADL's.  Pt denies symptoms during call.  Appt sched for  06/13/2012,   but wonders if he needs to be seen sooner. RN advised to be seen within 24 hrs for evaluation of breathing problems occuring with activity and relieves witrh stopping  - NO appts with Dr Patsy Lager for today , nor 05/24/2012 . RN offered to schedule appt for 1100  with Dr Para March today but he wanted to come in the AM -- Appt scheduled with Dr. Dayton Martes at 1045  on  05/24/2012 .
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
## 2012-05-23 NOTE — Telephone Encounter (Signed)
Have him come in today - 15 min  Known well

## 2012-05-23 NOTE — Telephone Encounter (Signed)
PATIENT COMING IN TODAY

## 2012-05-23 NOTE — Telephone Encounter (Signed)
[facility] called critical results, K+ 6.1, no hemolysis

## 2012-05-23 NOTE — Patient Instructions (Addendum)
REFERRAL: GO THE THE FRONT ROOM AT THE ENTRANCE OF OUR CLINIC, NEAR CHECK IN. ASK FOR [name]. SHE WILL HELP YOU SET UP YOUR REFERRAL. DATE: TIME:  

## 2012-05-24 ENCOUNTER — Other Ambulatory Visit: Payer: Self-pay

## 2012-05-24 ENCOUNTER — Ambulatory Visit: Payer: Self-pay | Admitting: Family Medicine

## 2012-05-24 ENCOUNTER — Ambulatory Visit (INDEPENDENT_AMBULATORY_CARE_PROVIDER_SITE_OTHER): Payer: Medicare Other | Admitting: Cardiovascular Disease

## 2012-05-24 ENCOUNTER — Encounter: Payer: Self-pay | Admitting: Cardiovascular Disease

## 2012-05-24 VITALS — BP 138/82 | HR 62 | Ht 68.0 in | Wt 231.2 lb

## 2012-05-24 DIAGNOSIS — E785 Hyperlipidemia, unspecified: Secondary | ICD-10-CM

## 2012-05-24 DIAGNOSIS — R0600 Dyspnea, unspecified: Secondary | ICD-10-CM | POA: Insufficient documentation

## 2012-05-24 DIAGNOSIS — I10 Essential (primary) hypertension: Secondary | ICD-10-CM

## 2012-05-24 DIAGNOSIS — R0602 Shortness of breath: Secondary | ICD-10-CM

## 2012-05-24 NOTE — Assessment & Plan Note (Signed)
The patient complains of recent onset exertional dyspnea without chest pain. He has no symptoms or signs suggestive of heart failure. His baseline ECG does not show any significant ischemia. His labs were unremarkable except for hyperkalemia. His symptoms are worrisome for possible coronary ischemia especially with his known history of coronary artery disease and multiple risk factors. Thus, I recommend evaluation with a treadmill nuclear stress test. The imaging part as needed given his previous history of myocardial infarction. I will also obtain an echocardiogram to evaluate diastolic function and pulmonary pressure. He does have also symptoms of sleep apnea which likely is worth investigating after his cardiac workup. He will followup with me after his cardiac evaluation.

## 2012-05-24 NOTE — Assessment & Plan Note (Signed)
His blood pressure is reasonably controlled. If he continues to have hyperkalemia, lisinopril might need to be decreased or changed

## 2012-05-24 NOTE — Assessment & Plan Note (Signed)
Lab Results  Component Value Date   CHOL 242* 02/26/2011   HDL 40.80 02/26/2011   LDLDIRECT 173.5 05/23/2012   TRIG 119.0 02/26/2011   CHOLHDL 6 02/26/2011   Given his known history of coronary artery disease, target LDL is less than 70. I will discuss with him switching to a more potent statin upon followup.

## 2012-05-24 NOTE — Patient Instructions (Addendum)
Your physician has requested that you have an echocardiogram. Echocardiography is a painless test that uses sound waves to create images of your heart. It provides your doctor with information about the size and shape of your heart and how well your heart's chambers and valves are working. This procedure takes approximately one hour. There are no restrictions for this procedure.  Your physician has requested that you have en exercise stress myoview. For further information please visit [email or web address] Please follow instruction sheet, as given.  Follow up after tests

## 2012-05-24 NOTE — Progress Notes (Signed)
HPI  [name] is a 66 y.o. very pleasant male patient who was referred by Dr. Patsy Lager for evaluation of exertional dyspnea. The patient has known history of coronary artery disease status post myocardial infarction in 1992 which was treated with thrombolytics at [facility]. Cardiac catheterization at that time showed no obstructive disease. No cardiac events since that time. He has other chronic conditions that include diabetes, and a approximate 20-pack-year smoking history, and a history of obesity as well as hyperlipidemia. In 2005 he fell off a house in [place] with significant trauma and aortic tear. He was flown to [facility] where he underwent aortic repair and other surgeries. He was hospitalized for at least 3 weeks at that time. The patient is here today for evaluation of exertional dyspnea which started about 2 months ago. He describes the symptoms as mild overall without associated chest pain. They happen with walking up a hill.  He denies orthopnea, PND or lower extremity edema. He thinks that he is out of shape. He started doing more walking recently and noticed that his dyspnea started improving. He has no history of sleep apnea. However, he has been told by his wife on multiple occasions that he snores loud in his breathing stops as well.  Quit smoking about 20 years ago. Smoked about 20 years.  He's not been seen in approximately a year, and we do not have any recent baseline laboratories.   Allergies  Allergen Reactions  . Dye Fdc Red (Red Dye)   . Penicillins      Current Outpatient Prescriptions on File Prior to Visit  Medication Sig Dispense Refill  . citalopram (CELEXA) 20 MG tablet Take 1 tablet (20 mg total) by mouth daily.  30 tablet  11  . ferrous sulfate 325 (65 FE) MG tablet Take 325 mg by mouth 2 (two) times daily.        [name] Kitchen gemfibrozil (LOPID) 600 MG tablet Take 1 tablet (600 mg total) by mouth 2 (two) times daily.  60 tablet  11  . lisinopril  (PRINIVIL,ZESTRIL) 40 MG tablet Take 1 tablet (40 mg total) by mouth daily.  30 tablet  11  . metFORMIN (GLUCOPHAGE-XR) 500 MG 24 hr tablet TAKE 1 TABLET EVERY DAY  30 tablet  0  . pravastatin (PRAVACHOL) 40 MG tablet Take 1 tablet (40 mg total) by mouth at bedtime.  30 tablet  11  . ranitidine (ZANTAC) 150 MG tablet Take 150 mg by mouth 2 (two) times daily.        . vardenafil (LEVITRA) 20 MG tablet Take 1 tablet (20 mg total) by mouth daily as needed for erectile dysfunction. 1 by mouth 30 min before intercourse  10 tablet  11   Current Facility-Administered Medications on File Prior to Visit  Medication Dose Route Frequency Provider Last Rate Last Dose  . albuterol (PROVENTIL) (5 MG/ML) 0.5% nebulizer solution 2.5 mg  2.5 mg Nebulization Once Hannah Beat, MD   2.5 mg at 05/23/12 1206     Past Medical History  Diagnosis Date  . CAD (coronary artery disease)   . MI (myocardial infarction) 1992    Acute anterior MI, thrombolytic therapy  . Diabetes mellitus     Type II  . Depression   . Hyperlipidemia     Statin with joint pain   . Hypertension   . Peripheral vascular disease     Atherosclerotic:R renal artery stenosis  . Fall 2005    fell off house: torn  aorta, clavicle fracture, rib fracture, vertebral fractures, lung contusion, coma x 2 weeks  . Erectile dysfunction   . Aortic transection 2005    Traumatic after a fall from a second floor. s/p repair at [facility]  . Gallstone pancreatitis   . History of bleeding ulcers      Past Surgical History  Procedure Date  . Cholecystectomy   . Cardiovascular surgery     with ruptured Aorta, Dr. Meyer Russel, [facility]   . Knee surgery     Right   . Thoracotomy     thoracic aorta repair   . Tracheostomy     s/p reversal  . Cardiac catheterization 08/1991    50 % mid-Lad stenosis with clot, 25-505 second marginal     Family History  Problem Relation Age of Onset  . Dementia Mother 3  . Heart attack Father 76  . Diabetes Brother  23  . Heart attack Brother      History   Social History  . Marital Status: Married    Spouse Name: N/A    Number of Children: N/A  . 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 Smoking status: Former Smoker -- 1.0 packs/day    Types: Cigarettes  . Smokeless tobacco: Not on file   Comment: quit post MI  . Alcohol Use: Yes     occassionally  . Drug Use: No  . Sexually Active: Not on file   Other Topics Concern  . Not on file   Social History Narrative   No regular exercise      ROS Constitutional: Negative for fever, chills, diaphoresis, activity change, appetite change.  HENT: Negative for hearing loss, nosebleeds, congestion, sore throat, facial swelling, drooling, trouble swallowing, neck pain, voice change, sinus pressure and tinnitus.  Eyes: Negative for photophobia, pain, discharge and visual disturbance.  Respiratory: Negative for apnea, cough, chest tightness and wheezing.  Cardiovascular: Negative for chest pain, palpitations and leg swelling.  Gastrointestinal: Negative for nausea, vomiting, abdominal pain, diarrhea, constipation, blood in stool and abdominal distention.  Genitourinary: Negative for dysuria, urgency, frequency, hematuria and decreased urine volume.  Musculoskeletal: Negative for myalgias, back pain, joint swelling, arthralgias and gait problem.  Skin: Negative for color change, pallor, rash and wound.  Neurological: Negative for dizziness, tremors, seizures, syncope, speech difficulty, weakness, light-headedness, numbness and headaches.  Psychiatric/Behavioral: Negative for suicidal ideas, hallucinations, behavioral problems and agitation. The patient is not nervous/anxious.     PHYSICAL EXAM   BP 138/82  Pulse 62  Ht 5\' 8"  (1.727 m)  Wt 231 lb 4 oz (104.894 kg)  BMI 35.16 kg/m2 Constitutional: He is oriented to person, place, and time. He appears well-developed and  well-nourished. No distress.  HENT: No nasal discharge.  Head: Normocephalic and atraumatic.  Eyes: Pupils are equal and round. Right eye exhibits no discharge. Left eye exhibits no discharge.  Neck: Normal range of motion. Neck supple. No JVD present. No thyromegaly present.  Cardiovascular: Normal rate, regular rhythm, normal heart sounds and. Exam reveals no gallop and no friction rub. No murmur heard.  Pulmonary/Chest: Effort normal and breath sounds normal. No stridor. No respiratory distress. He has no wheezes. He has no rales. He exhibits no tenderness.  Abdominal: Soft. Bowel sounds are normal. He exhibits no distension. There is no tenderness. There is no rebound and no guarding.  Musculoskeletal: Normal range of motion. He exhibits no edema and no  tenderness.  Neurological: He is alert and oriented to person, place, and time. Coordination normal.  Skin: Skin is warm and dry. No rash noted. He is not diaphoretic. No erythema. No pallor.  Psychiatric: He has a normal mood and affect. His behavior is normal. Judgment and thought content normal.       EKG: Normal sinus rhythm. isolated Q wave in lead 3. No significant ST or T wave changes.   ASSESSMENT AND PLAN

## 2012-05-26 ENCOUNTER — Encounter: Payer: Self-pay | Admitting: Family Medicine

## 2012-06-06 ENCOUNTER — Other Ambulatory Visit (INDEPENDENT_AMBULATORY_CARE_PROVIDER_SITE_OTHER): Payer: Medicare Other

## 2012-06-06 DIAGNOSIS — E875 Hyperkalemia: Secondary | ICD-10-CM

## 2012-06-06 LAB — BASIC METABOLIC PANEL WITH GFR
BUN: 21 mg/dL (ref 6–23)
CO2: 25 meq/L (ref 19–32)
Calcium: 9.5 mg/dL (ref 8.4–10.5)
Chloride: 108 meq/L (ref 96–112)
Creatinine, Ser: 1.1 mg/dL (ref 0.4–1.5)
GFR: 73.71 mL/min
Glucose, Bld: 110 mg/dL — ABNORMAL HIGH (ref 70–99)
Potassium: 5.6 meq/L — ABNORMAL HIGH (ref 3.5–5.1)
Sodium: 141 meq/L (ref 135–145)

## 2012-06-07 ENCOUNTER — Encounter: Payer: Self-pay | Admitting: *Deleted

## 2012-06-07 ENCOUNTER — Ambulatory Visit: Payer: Self-pay | Admitting: Cardiovascular Disease

## 2012-06-13 ENCOUNTER — Encounter: Payer: Self-pay | Admitting: Family Medicine

## 2012-06-13 ENCOUNTER — Encounter: Payer: Self-pay | Admitting: Gastroenterology

## 2012-06-13 ENCOUNTER — Ambulatory Visit (INDEPENDENT_AMBULATORY_CARE_PROVIDER_SITE_OTHER): Payer: Medicare Other | Admitting: Family Medicine

## 2012-06-13 VITALS — BP 140/70 | HR 78 | Temp 98.2°F | Ht 68.0 in | Wt 232.2 lb

## 2012-06-13 DIAGNOSIS — Z1211 Encounter for screening for malignant neoplasm of colon: Secondary | ICD-10-CM

## 2012-06-13 DIAGNOSIS — E785 Hyperlipidemia, unspecified: Secondary | ICD-10-CM

## 2012-06-13 DIAGNOSIS — L570 Actinic keratosis: Secondary | ICD-10-CM

## 2012-06-13 DIAGNOSIS — E119 Type 2 diabetes mellitus without complications: Secondary | ICD-10-CM

## 2012-06-13 DIAGNOSIS — I10 Essential (primary) hypertension: Secondary | ICD-10-CM

## 2012-06-13 DIAGNOSIS — Z23 Encounter for immunization: Secondary | ICD-10-CM

## 2012-06-13 DIAGNOSIS — Z0000 Encounter for general adult medical examination without abnormal findings: Secondary | ICD-10-CM

## 2012-06-13 MED ORDER — ATORVASTATIN CALCIUM 40 MG PO TABS: 40.0000 mg | ORAL_TABLET | Freq: Every day | ORAL | Status: DC

## 2012-06-13 NOTE — Patient Instructions (Addendum)
REFERRAL: GO THE THE FRONT ROOM AT THE ENTRANCE OF OUR CLINIC, NEAR CHECK IN. ASK FOR MARION. SHE WILL HELP YOU SET UP YOUR REFERRAL. DATE: TIME:  

## 2012-06-13 NOTE — Progress Notes (Signed)
Nature conservation officer at [facility] [address] [place] [address] Phone: [phone] Fax: [phone]  Date:  06/13/2012   Name:  [name]   DOB:  1946/01/26   MRN:  [id number]  PCP:  Colton Beat, MD    Chief Complaint: Annual Exam   History of Present Illness:  [name] is a 66 y.o. very pleasant male patient who presents with the following:  Medicare physical, wellness:  Diabetes Mellitus: Tolerating Medications: yes Foot problems: none Hypoglycemia: none No nausea, vomitting, blurred vision, polyuria.  Lab Results  Component Value Date   HGBA1C 5.9 05/23/2012    Wt Readings from Last 3 Encounters:  06/13/12 232 lb 4 oz (105.348 kg)  05/24/12 231 lb 4 oz (104.894 kg)  05/23/12 231 lb 12 oz (105.121 kg)    Body mass index is 35.31 kg/(m^2).   HTN: Tolerating all medications without side effects Stable and at goal No CP, no sob. No HA.  BP Readings from Last 3 Encounters:  06/13/12 140/70  05/24/12 138/82  05/23/12 150/80    Basic Metabolic Panel:    Component Value Date/Time   NA 141 06/06/2012 1212   K 5.6* 06/06/2012 1212   CL 108 06/06/2012 1212   CO2 25 06/06/2012 1212   BUN 21 06/06/2012 1212   CREATININE 1.1 06/06/2012 1212   GLUCOSE 110* 06/06/2012 1212   CALCIUM 9.5 06/06/2012 1212   Lipids:Tolerating meds fine with no SE. LDL 173 Panel reviewed with patient.  Lipids:    Component Value Date/Time   CHOL 242* 02/26/2011 1052   TRIG 119.0 02/26/2011 1052   HDL 40.80 02/26/2011 1052   LDLDIRECT 173.5 05/23/2012 1215   VLDL 23.8 02/26/2011 1052   CHOLHDL 6 02/26/2011 1052    Lab Results  Component Value Date   ALT 20 05/23/2012   AST 21 05/23/2012   ALKPHOS 54 05/23/2012   BILITOT 1.1 05/23/2012     Preventative Health Maintenance Visit:  Health Maintenance Summary Reviewed and updated, unless pt declines services.  Tobacco History Reviewed. Alcohol: No concerns, no excessive use Exercise Habits: trying to start walking STD  concerns: no risk or activity to increase risk Drug Use: None Encouraged self-testicular check  Health Maintenance  Topic Date Due  . Foot Exam  05/23/1957  . Influenza Vaccine  08/30/2012  . Hemoglobin A1c  11/22/2012  . Urine Microalbumin  05/23/2013  . Ophthalmology Exam  06/13/2013  . Colonoscopy  03/03/2021  . Tetanus/tdap  03/03/2021  . Pneumococcal Polysaccharide Vaccine Age 58 And Over  Completed  . Zostavax  Addressed    Labs reviewed with the patient.  Results for orders placed in visit on 06/06/12  BASIC METABOLIC PANEL      Component Value Range   Sodium 141  135 - 145 mEq/L   Potassium 5.6 (*) 3.5 - 5.1 mEq/L   Chloride 108  96 - 112 mEq/L   CO2 25  19 - 32 mEq/L   Glucose, Bld 110 (*) 70 - 99 mg/dL   BUN 21  6 - 23 mg/dL   Creatinine, Ser 1.1  0.4 - 1.5 mg/dL   Calcium 9.5  8.4 - 86.5 mg/dL   GFR 78.46  >96.29 mL/min     Patient Active Problem List  Diagnosis  . DIABETES MELLITUS, TYPE II  . HYPERLIPIDEMIA  . ERECTILE DYSFUNCTION  . DEPRESSION  . HYPERTENSION  . CORONARY ARTERY DISEASE  . Routine general medical examination at a health care facility  .  Anemia associated with acute blood loss  . Duodenal ulcer with hemorrhage  . Shortness of breath    Past Medical History  Diagnosis Date  . CAD (coronary artery disease)   . MI (myocardial infarction) 1992    Acute anterior MI, thrombolytic therapy  . Diabetes mellitus     Type II  . Depression   . Hyperlipidemia     Statin with joint pain   . Hypertension   . Peripheral vascular disease     Atherosclerotic:R renal artery stenosis  . Fall 2005    fell off house: torn aorta, clavicle fracture, rib fracture, vertebral fractures, lung contusion, coma x 2 weeks  . Erectile dysfunction   . Aortic transection 2005    Traumatic after a fall from a second floor. s/p repair at [facility]  . Gallstone pancreatitis   . History of bleeding ulcers     Past Surgical History  Procedure Date  . Cholecystectomy     . Cardiovascular surgery     with ruptured Aorta, Dr. Meyer Rhodes, [facility]   . Knee surgery     Right   . Thoracotomy     thoracic aorta repair   . Tracheostomy     s/p reversal  . Cardiac catheterization 08/1991    50 % mid-Lad stenosis with clot, 25-505 second marginal    History  Substance Use Topics  . Smoking status: Former Smoker -- 1.0 packs/day    Types: Cigarettes  . Smokeless tobacco: Not on file   Comment: quit post MI  . Alcohol Use: Yes     occassionally    Family History  Problem Relation Age of Onset  . Dementia Mother 82  . Heart attack Father 49  . Diabetes Brother 62  . Heart attack Brother     Allergies  Allergen Reactions  . Dye Fdc Red (Red Dye)   . Penicillins     Medication list has been reviewed and updated.  Current Outpatient Prescriptions on File Prior to Visit  Medication Sig Dispense Refill  . aspirin 81 MG tablet Take 81 mg by mouth daily.      . citalopram (CELEXA) 20 MG tablet Take 1 tablet (20 mg total) by mouth daily.  30 tablet  11  . ferrous sulfate 325 (65 FE) MG tablet Take 325 mg by mouth 2 (two) times daily.        [name] Kitchen gemfibrozil (LOPID) 600 MG tablet Take 1 tablet (600 mg total) by mouth 2 (two) times daily.  60 tablet  11  . metFORMIN (GLUCOPHAGE-XR) 500 MG 24 hr tablet TAKE 1 TABLET EVERY DAY  30 tablet  0  . pravastatin (PRAVACHOL) 40 MG tablet Take 1 tablet (40 mg total) by mouth at bedtime.  30 tablet  11  . ranitidine (ZANTAC) 150 MG tablet Take 150 mg by mouth 2 (two) times daily.        . vardenafil (LEVITRA) 20 MG tablet Take 1 tablet (20 mg total) by mouth daily as needed for erectile dysfunction. 1 by mouth 30 min before intercourse  10 tablet  11  . lisinopril (PRINIVIL,ZESTRIL) 40 MG tablet Take 1 tablet (40 mg total) by mouth daily.  30 tablet  11    Review of Systems:   General: Denies fever, chills, sweats. No significant weight loss. Eyes: Denies blurring,significant itching ENT: Denies earache, sore  throat, and hoarseness. Cardiovascular: [name] to have some mild dyspnea on exertion. He reports that his recent stress test was negative, but  I do not have those results in his chart yet.  Respiratory: Denies cough, dyspnea at rest,wheeezing Breast: no concerns about lumps GI: Denies nausea, vomiting, diarrhea, constipation, change in bowel habits, abdominal pain, melena, hematochezia GU: Denies penile discharge, ED, urinary flow / outflow problems. No STD concerns. Musculoskeletal: Denies back pain, joint pain Derm: Multiple skin lesions  Neuro: Denies  paresthesias, frequent falls, frequent headaches Psych: Denies depression, anxiety Endocrine: Denies cold intolerance, heat intolerance, polydipsia Heme: Denies enlarged lymph nodes Allergy: No hayfever   Physical Examination: Filed Vitals:   06/13/12 1359  BP: 140/70  Pulse: 78  Temp: 98.2 F (36.8 C)   Filed Vitals:   06/13/12 1359  Height: 5\' 8"  (1.727 m)  Weight: 232 lb 4 oz (105.348 kg)   Body mass index is 35.31 kg/(m^2). Ideal Body Weight: Weight in (lb) to have BMI = 25: 164.1    Wt Readings from Last 3 Encounters:  06/13/12 232 lb 4 oz (105.348 kg)  05/24/12 231 lb 4 oz (104.894 kg)  05/23/12 231 lb 12 oz (105.121 kg)    GEN: well developed, well nourished, no acute distress Eyes: conjunctiva and lids normal, PERRLA, EOMI ENT: TM clear, nares clear, oral exam WNL Neck: supple, no lymphadenopathy, no thyromegaly, no JVD Pulm: clear to auscultation and percussion, respiratory effort normal CV: regular rate and rhythm, S1-S2, no murmur, rub or gallop, no bruits, peripheral pulses normal and symmetric, no cyanosis, clubbing, edema or varicosities Chest: no scars, masses GI: soft, non-tender; no hepatosplenomegaly, masses; active bowel sounds all quadrants GU: no hernia, testicular mass, penile discharge, or prostate enlargement Lymph: no cervical, axillary or inguinal adenopathy MSK: gait normal, muscle tone and  strength WNL, no joint swelling, effusions, discoloration, crepitus  SKIN: Multiple friable appearing skin lesions with crusting on his arms. Some on his neck and face Neuro: normal mental status, normal strength, sensation, and motion Psych: alert; oriented to person, place and time, normally interactive and not anxious or depressed in appearance.   Assessment and Plan:  1. Routine general medical examination at a health care facility    2. Actinic keratosis  Ambulatory referral to Dermatology  3. Special screening for malignant neoplasm of colon  Ambulatory referral to Gastroenterology  4. Immunization due  Pneumococcal polysaccharide vaccine 23-valent greater than or equal to 2yo subcutaneous/IM  5. HYPERLIPIDEMIA : increase from prava 40 to lipitor 40 mg, not at goal.   6. DIABETES MELLITUS, TYPE II, reviewed all labs   7. HYPERTENSION : has been stable     I have personally reviewed the Medicare Annual Wellness questionnaire and have noted 1. The patient's medical and social history 2. Their use of alcohol, tobacco or illicit drugs 3. Their current medications and supplements 4. The patient's functional ability including ADL's, fall risks, home safety risks and hearing or visual             impairment. 5. Diet and physical activities 6. Evidence for depression or mood disorders  The patients weight, height, BMI and visual acuity have been recorded in the chart I have made referrals, counseling and provided education to the patient based review of the above and I have provided the pt with a written personalized care plan for preventive services.  I have provided the patient with a copy of your personalized plan for preventive services. Instructed to take the time to review along with their updated medication list.   Orders Today:  Orders Placed This Encounter  Procedures  . Pneumococcal  polysaccharide vaccine 23-valent greater than or equal to 2yo subcutaneous/IM  . Ambulatory  referral to Gastroenterology    Referral Priority:  Routine    Referral Type:  Consultation    Referral Reason:  Specialty Services Required    Requested Specialty:  Gastroenterology    Number of Visits Requested:  1  . Ambulatory referral to Dermatology    Referral Priority:  Routine    Referral Type:  Consultation    Referral Reason:  Specialty Services Required    Referred to Provider:  Floyce Stakes, MD    Requested Specialty:  Dermatology    Number of Visits Requested:  1    Medications Today: (Includes new updates added during medication reconciliation) Meds ordered this encounter  Medications  . atorvastatin (LIPITOR) 40 MG tablet    Sig: Take 1 tablet (40 mg total) by mouth daily.    Dispense:  30 tablet    Refill:  11      Colton Beat, MD

## 2012-06-20 ENCOUNTER — Other Ambulatory Visit: Payer: Self-pay | Admitting: Cardiovascular Disease

## 2012-06-20 ENCOUNTER — Other Ambulatory Visit: Payer: Self-pay | Admitting: *Deleted

## 2012-06-20 ENCOUNTER — Other Ambulatory Visit: Payer: Self-pay

## 2012-06-20 ENCOUNTER — Other Ambulatory Visit (INDEPENDENT_AMBULATORY_CARE_PROVIDER_SITE_OTHER): Payer: Medicare Other

## 2012-06-20 DIAGNOSIS — R0602 Shortness of breath: Secondary | ICD-10-CM

## 2012-06-20 DIAGNOSIS — R0609 Other forms of dyspnea: Secondary | ICD-10-CM

## 2012-06-20 DIAGNOSIS — R0989 Other specified symptoms and signs involving the circulatory and respiratory systems: Secondary | ICD-10-CM

## 2012-06-20 MED ORDER — LISINOPRIL 40 MG PO TABS: 40.0000 mg | ORAL_TABLET | Freq: Every day | ORAL | Status: DC

## 2012-06-23 ENCOUNTER — Ambulatory Visit (INDEPENDENT_AMBULATORY_CARE_PROVIDER_SITE_OTHER): Payer: Medicare Other | Admitting: Cardiovascular Disease

## 2012-06-23 ENCOUNTER — Encounter: Payer: Self-pay | Admitting: Cardiovascular Disease

## 2012-06-23 VITALS — BP 140/80 | HR 60 | Ht 69.0 in | Wt 230.2 lb

## 2012-06-23 DIAGNOSIS — I2510 Atherosclerotic heart disease of native coronary artery without angina pectoris: Secondary | ICD-10-CM

## 2012-06-23 DIAGNOSIS — E785 Hyperlipidemia, unspecified: Secondary | ICD-10-CM

## 2012-06-23 DIAGNOSIS — I10 Essential (primary) hypertension: Secondary | ICD-10-CM

## 2012-06-23 NOTE — Patient Instructions (Addendum)
Your heart tests were fine.  Start an exercise program.  Follow up in 1 year.

## 2012-06-24 ENCOUNTER — Other Ambulatory Visit: Payer: Self-pay | Admitting: Family Medicine

## 2012-06-24 ENCOUNTER — Ambulatory Visit: Payer: Medicare Other | Admitting: Cardiovascular Disease

## 2012-06-27 NOTE — Assessment & Plan Note (Signed)
Lab Results  Component Value Date   CHOL 242* 02/26/2011   HDL 40.80 02/26/2011   LDLDIRECT 173.5 05/23/2012   TRIG 119.0 02/26/2011   CHOLHDL 6 02/26/2011   his LDL was significantly elevated. However, it appears that he was switched to atorvastatin that time. He will be undergoing a repeat lipid profile in the near future. I recommend a target LDL of less than 70 if possible with current dose of atorvastatin.

## 2012-06-27 NOTE — Assessment & Plan Note (Signed)
The patient's recent cardiac workup was unremarkable. Stress test showed no evidence of ischemia or prior infarcts. Echocardiogram showed normal LV systolic function. Continue medical therapy. Suspect that his dyspnea is likely multifactorial due to some element of physical deconditioning and previous tobacco use.

## 2012-06-27 NOTE — Assessment & Plan Note (Signed)
His blood pressure is reasonably controlled. 

## 2012-06-27 NOTE — Progress Notes (Signed)
HPI  [name] is a 66 y.o. very pleasant male patient who is here today for a followup visit regarding exertional dyspnea. The patient has known history of coronary artery disease status post myocardial infarction in 1992 which was treated with thrombolytics at [facility]. Cardiac catheterization at that time showed no obstructive disease. No cardiac events since that time. He has other chronic conditions that include diabetes, and a approximate 20-pack-year smoking history, and a history of obesity as well as hyperlipidemia.  In 2005 he fell off a house in [place] with significant trauma and aortic tear. He was flown to [facility] where he underwent aortic repair and other surgeries. He was hospitalized for at least 3 weeks at that time.  Quit smoking about 20 years ago. Smoked about 20 years.  He underwent cardiac evaluation which included an echocardiogram. This showed normal LV systolic function with mild grade 1 diastolic dysfunction and no significant valvular abnormalities. He underwent a treadmill nuclear stress test which showed no evidence of ischemia or infarcts with normal ejection fraction.   Allergies  Allergen Reactions  . Dye Fdc Red (Red Dye)   . Penicillins      Current Outpatient Prescriptions on File Prior to Visit  Medication Sig Dispense Refill  . aspirin 81 MG tablet Take 81 mg by mouth daily.      [name] Kitchen atorvastatin (LIPITOR) 40 MG tablet Take 1 tablet (40 mg total) by mouth daily.  30 tablet  11  . citalopram (CELEXA) 20 MG tablet Take 1 tablet (20 mg total) by mouth daily.  30 tablet  11  . lisinopril (PRINIVIL,ZESTRIL) 40 MG tablet Take 1 tablet (40 mg total) by mouth daily.  30 tablet  11  . ranitidine (ZANTAC) 150 MG tablet Take 150 mg by mouth 2 (two) times daily.        . vardenafil (LEVITRA) 20 MG tablet Take 1 tablet (20 mg total) by mouth daily as needed for erectile dysfunction. 1 by mouth 30 min before intercourse  10 tablet  11  . metFORMIN  (GLUCOPHAGE-XR) 500 MG 24 hr tablet TAKE 1 TABLET EVERY DAY  30 tablet  5     Past Medical History  Diagnosis Date  . CAD (coronary artery disease)   . MI (myocardial infarction) 1992    Acute anterior MI, thrombolytic therapy  . Diabetes mellitus     Type II  . Depression   . Hyperlipidemia     Statin with joint pain   . Hypertension   . Peripheral vascular disease     Atherosclerotic:R renal artery stenosis  . Fall 2005    fell off house: torn aorta, clavicle fracture, rib fracture, vertebral fractures, lung contusion, coma x 2 weeks  . Erectile dysfunction   . Aortic transection 2005    Traumatic after a fall from a second floor. s/p repair at [facility]  . Gallstone pancreatitis   . History of bleeding ulcers      Past Surgical History  Procedure Date  . Cholecystectomy   . Cardiovascular surgery     with ruptured Aorta, Dr. Meyer Russel, [facility]   . Knee surgery     Right   . Thoracotomy     thoracic aorta repair   . Tracheostomy     s/p reversal  . Cardiac catheterization 08/1991    50 % mid-Lad stenosis with clot, 25-505 second marginal     Family History  Problem Relation Age of Onset  . Dementia Mother  88  . Heart attack Father 67  . Diabetes Brother 4  . Heart attack Brother      History   Social History  . Marital Status: Married    Spouse Name: N/A    Number of Children: N/A  . 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 Smoking status: Former Smoker -- 1.0 packs/day    Types: Cigarettes  . Smokeless tobacco: Not on file   Comment: quit post MI  . Alcohol Use: Yes     occassionally  . Drug Use: No  . Sexually Active: Not on file   Other Topics Concern  . Not on file   Social History Narrative   No regular exercise      PHYSICAL EXAM   BP 140/80  Pulse 60  Ht 5\' 9"  (1.753 m)  Wt 230 lb 4 oz (104.441 kg)  BMI 34.00 kg/m2  Constitutional: He is oriented to person, place,  and time. He appears well-developed and well-nourished. No distress.  HENT: No nasal discharge.  Head: Normocephalic and atraumatic.  Eyes: Pupils are equal and round. Right eye exhibits no discharge. Left eye exhibits no discharge.  Neck: Normal range of motion. Neck supple. No JVD present. No thyromegaly present.  Cardiovascular: Normal rate, regular rhythm, normal heart sounds and. Exam reveals no gallop and no friction rub. No murmur heard.  Pulmonary/Chest: Effort normal and breath sounds normal. No stridor. No respiratory distress. He has no wheezes. He has no rales. He exhibits no tenderness.  Abdominal: Soft. Bowel sounds are normal. He exhibits no distension. There is no tenderness. There is no rebound and no guarding.  Musculoskeletal: Normal range of motion. He exhibits no edema and no tenderness.  Neurological: He is alert and oriented to person, place, and time. Coordination normal.  Skin: Skin is warm and dry. No rash noted. He is not diaphoretic. No erythema. No pallor.  Psychiatric: He has a normal mood and affect. His behavior is normal. Judgment and thought content normal.        ASSESSMENT AND PLAN

## 2012-07-06 ENCOUNTER — Ambulatory Visit (AMBULATORY_SURGERY_CENTER): Payer: Medicare Other | Admitting: *Deleted

## 2012-07-06 VITALS — Ht 70.0 in | Wt 231.1 lb

## 2012-07-06 DIAGNOSIS — Z1211 Encounter for screening for malignant neoplasm of colon: Secondary | ICD-10-CM

## 2012-07-06 MED ORDER — MOVIPREP 100 G PO SOLR: ORAL | Status: DC

## 2012-07-07 ENCOUNTER — Encounter: Payer: Self-pay | Admitting: Gastroenterology

## 2012-07-20 ENCOUNTER — Encounter: Payer: Self-pay | Admitting: Gastroenterology

## 2012-07-20 ENCOUNTER — Ambulatory Visit (AMBULATORY_SURGERY_CENTER): Payer: Medicare Other | Admitting: Gastroenterology

## 2012-07-20 VITALS — BP 170/95 | HR 77 | Temp 98.9°F | Resp 22 | Ht 70.0 in | Wt 231.0 lb

## 2012-07-20 DIAGNOSIS — Z1211 Encounter for screening for malignant neoplasm of colon: Secondary | ICD-10-CM

## 2012-07-20 MED ORDER — SODIUM CHLORIDE 0.9 % IV SOLN: 500.0000 mL | INTRAVENOUS | Status: DC

## 2012-07-20 NOTE — Op Note (Signed)
[facility] [address] [place], [address]   COLONOSCOPY PROCEDURE REPORT  PATIENT: Colton, Rhodes  MR#: [id number] BIRTHDATE: 05/23/1946 , 66  yrs. old GENDER: Male ENDOSCOPIST: Mardella Layman, MD, Clementeen Graham REFERRED BY:  Juleen China, M.D. PROCEDURE DATE:  07/20/2012 PROCEDURE:   Colonoscopy, screening ASA CLASS:   Class II INDICATIONS:average risk patient for colon cancer. MEDICATIONS: propofol (Diprivan) 200mg  IV  DESCRIPTION OF PROCEDURE:   After the risks and benefits and of the procedure were explained, informed consent was obtained.  A digital rectal exam revealed no abnormalities of the rectum.    The LB [id number]-[id number]  endoscope was introduced through the anus and advanced to the cecum, which was identified by both the appendix and ileocecal valve .  The quality of the prep was excellent, using MoviPrep .  The instrument was then slowly withdrawn as the colon was fully examined.     COLON FINDINGS: Mild diverticulosis was noted in the descending colon and sigmoid colon.   The colonic mucosa appeared normal.   No polyps or cancer.     Retroflexed views revealed no abnormalities. The scope was then withdrawn from the patient and the procedure completed.  COMPLICATIONS: There were no complications. ENDOSCOPIC IMPRESSION: 1.   Mild diverticulosis was noted in the descending colon and sigmoid colon 2.   The colonic mucosa appeared normal 3.   No polyps or cancer  RECOMMENDATIONS: 1.  High fiber diet. 2.  Repeat Colonscopy in 10 years.   REPEAT EXAM:  cc:  _______________________________ eSignedMardella Layman, MD, [facility] 07/20/2012 11:25 AM

## 2012-07-20 NOTE — Patient Instructions (Signed)
YOU HAD AN ENDOSCOPIC PROCEDURE TODAY AT THE [facility]: Refer to the procedure report that was given to you for any specific questions about what was found during the examination.  If the procedure report does not answer your questions, please call your gastroenterologist to clarify.  If you requested that your care partner not be given the details of your procedure findings, then the procedure report has been included in a sealed envelope for you to review at your convenience later.  YOU SHOULD EXPECT: Some feelings of bloating in the abdomen. Passage of more gas than usual.  Walking can help get rid of the air that was put into your GI tract during the procedure and reduce the bloating. If you had a lower endoscopy (such as a colonoscopy or flexible sigmoidoscopy) you may notice spotting of blood in your stool or on the toilet paper. If you underwent a bowel prep for your procedure, then you may not have a normal bowel movement for a few days.  DIET: Your first meal following the procedure should be a light meal and then it is ok to progress to your normal diet.  A half-sandwich or bowl of soup is an example of a good first meal.  Heavy or fried foods are harder to digest and may make you feel nauseous or bloated.  Likewise meals heavy in dairy and vegetables can cause extra gas to form and this can also increase the bloating.  Drink plenty of fluids but you should avoid alcoholic beverages for 24 hours.  ACTIVITY: Your care partner should take you home directly after the procedure.  You should plan to take it easy, moving slowly for the rest of the day.  You can resume normal activity the day after the procedure however you should NOT DRIVE or use heavy machinery for 24 hours (because of the sedation medicines used during the test).    SYMPTOMS TO REPORT IMMEDIATELY: A gastroenterologist can be reached at any hour.  During normal business hours, 8:30 AM to 5:00 PM Monday through Friday,  call [phone].  After hours and on weekends, please call the GI answering service at [phone] who will take a message and have the physician on call contact you.   Following lower endoscopy (colonoscopy or flexible sigmoidoscopy):  Excessive amounts of blood in the stool  Significant tenderness or worsening of abdominal pains  Swelling of the abdomen that is new, acute  Fever of 100F or higher  Following upper endoscopy (EGD)  Vomiting of blood or coffee ground material  New chest pain or pain under the shoulder blades  Painful or persistently difficult swallowing  New shortness of breath  Fever of 100F or higher  Black, tarry-looking stools  FOLLOW UP: If any biopsies were taken you will be contacted by phone or by letter within the next 1-3 weeks.  Call your gastroenterologist if you have not heard about the biopsies in 3 weeks.  Our staff will call the home number listed on your records the next business day following your procedure to check on you and address any questions or concerns that you may have at that time regarding the information given to you following your procedure. This is a courtesy call and so if there is no answer at the home number and we have not heard from you through the emergency physician on call, we will assume that you have returned to your regular daily activities without incident.  SIGNATURES/CONFIDENTIALITY: You and/or your care   partner have signed paperwork which will be entered into your electronic medical record.  These signatures attest to the fact that that the information above on your After Visit Summary has been reviewed and is understood.  Full responsibility of the confidentiality of this discharge information lies with you and/or your care-partner.  

## 2012-07-20 NOTE — Progress Notes (Signed)
Patient did not experience any of the following events: a burn prior to discharge; a fall within the facility; wrong site/side/patient/procedure/implant event; or a hospital transfer or hospital admission upon discharge from the facility. (G8907) Patient did not have preoperative order for IV antibiotic SSI prophylaxis. (G8918)  

## 2012-07-21 ENCOUNTER — Telehealth: Payer: Self-pay | Admitting: *Deleted

## 2012-07-21 NOTE — Telephone Encounter (Signed)
  Follow up Call-  Call back number 07/20/2012  Post procedure Call Back phone  # [phone] hm  Permission to leave phone message Yes     Patient questions:  Do you have a fever, pain , or abdominal swelling? no Pain Score  0 *  Have you tolerated food without any problems? yes  Have you been able to return to your normal activities? yes  Do you have any questions about your discharge instructions: Diet   no Medications  no Follow up visit  no  Do you have questions or concerns about your Care? no  Actions: * If pain score is 4 or above: No action needed, pain <4.

## 2012-07-25 ENCOUNTER — Encounter: Payer: Self-pay | Admitting: Family Medicine

## 2012-07-25 ENCOUNTER — Ambulatory Visit (INDEPENDENT_AMBULATORY_CARE_PROVIDER_SITE_OTHER): Payer: Medicare Other | Admitting: Family Medicine

## 2012-07-25 VITALS — BP 146/80 | HR 92 | Temp 98.9°F | Wt 228.0 lb

## 2012-07-25 DIAGNOSIS — J029 Acute pharyngitis, unspecified: Secondary | ICD-10-CM

## 2012-07-25 DIAGNOSIS — J069 Acute upper respiratory infection, unspecified: Secondary | ICD-10-CM | POA: Insufficient documentation

## 2012-07-25 LAB — POCT RAPID STREP A (OFFICE): Rapid Strep A Screen: NEGATIVE

## 2012-07-25 MED ORDER — BENZONATATE 200 MG PO CAPS: 200.0000 mg | ORAL_CAPSULE | Freq: Three times a day (TID) | ORAL | Status: AC | PRN

## 2012-07-25 NOTE — Patient Instructions (Addendum)
Take tessalon for cough.  Drink plenty of fluids, take tylenol as needed, and gargle with warm salt water for your throat.  This should gradually improve.  Take care.  Let us know if you have other concerns.

## 2012-07-25 NOTE — Progress Notes (Signed)
1 week of ST.  Since then some facial pressure.  No fevers.  Voice change noted by patient.  Still stuffy. Cough, some sputum.  Does have some post nasal gtt.  Cough is worse at night.  He's not better or worse over the last few days overall. Cough drops help some, temporarily.  Sugar has been controlled.    Meds, vitals, and allergies reviewed.   ROS: See HPI.  Otherwise, noncontributory.  GEN: nad, alert and oriented HEENT: mucous membranes moist, tm w/o erythema, nasal exam w/o erythema, clear discharge noted,  OP with cobblestoning NECK: supple w/o LA CV: rrr.   PULM: ctab, no inc wob EXT: no edema SKIN: no acute rash

## 2012-07-25 NOTE — Assessment & Plan Note (Signed)
Likely viral.  Supportive tx.  Nontoxic, ddx d/ wpt.  He understood.  See instructions.

## 2012-08-08 ENCOUNTER — Other Ambulatory Visit: Payer: Self-pay

## 2012-08-08 NOTE — Telephone Encounter (Signed)
Pt wanting to get glucose test strips and lancets; pt has always paid out of pocket. None on med list. Pt does not know what glucose meter he has. Pt will ck and call back. Pt not sure when he will call back; will wait for the call.

## 2012-09-20 ENCOUNTER — Telehealth: Payer: Self-pay | Admitting: *Deleted

## 2012-09-20 NOTE — Telephone Encounter (Signed)
Patient left message that he needs glucose meter and strips, didn't leave what kind, uses [facility] pharmacy

## 2012-09-21 NOTE — Telephone Encounter (Signed)
Spoke to patient and was advised that he was told just get the office to send a generic script over. Patient is to contact the pharmacy and have them let us know which one they have that we need to order. Patient stated that he will have th pharmacy contact the office.

## 2012-10-04 ENCOUNTER — Ambulatory Visit (INDEPENDENT_AMBULATORY_CARE_PROVIDER_SITE_OTHER): Payer: Medicare Other

## 2012-10-04 DIAGNOSIS — Z23 Encounter for immunization: Secondary | ICD-10-CM

## 2012-10-05 ENCOUNTER — Other Ambulatory Visit: Payer: Self-pay | Admitting: *Deleted

## 2012-10-05 MED ORDER — GLUCOSE BLOOD VI STRP: ORAL_STRIP | Status: DC

## 2012-11-24 ENCOUNTER — Ambulatory Visit: Payer: Medicare Other | Admitting: Family Medicine

## 2012-11-24 ENCOUNTER — Encounter: Payer: Self-pay | Admitting: Family Medicine

## 2012-11-24 ENCOUNTER — Ambulatory Visit (INDEPENDENT_AMBULATORY_CARE_PROVIDER_SITE_OTHER): Payer: Medicare Other | Admitting: Family Medicine

## 2012-11-24 VITALS — BP 140/88 | HR 79 | Temp 98.1°F | Ht 70.0 in | Wt 236.8 lb

## 2012-11-24 DIAGNOSIS — N481 Balanitis: Secondary | ICD-10-CM

## 2012-11-24 DIAGNOSIS — N476 Balanoposthitis: Secondary | ICD-10-CM

## 2012-11-24 DIAGNOSIS — L259 Unspecified contact dermatitis, unspecified cause: Secondary | ICD-10-CM

## 2012-11-24 DIAGNOSIS — L239 Allergic contact dermatitis, unspecified cause: Secondary | ICD-10-CM

## 2012-11-24 DIAGNOSIS — R21 Rash and other nonspecific skin eruption: Secondary | ICD-10-CM

## 2012-11-24 MED ORDER — DEXAMETHASONE SODIUM PHOSPHATE 10 MG/ML IJ SOLN
10.0000 mg | Freq: Once | INTRAMUSCULAR | Status: AC
  Administered 2012-11-24: 10 mg via INTRAMUSCULAR
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
## 2012-11-24 NOTE — Patient Instructions (Addendum)
Lotrimin, twice a day to the head of penis, pull skin back and then pull back over

## 2013-02-16 ENCOUNTER — Other Ambulatory Visit: Payer: Self-pay | Admitting: *Deleted

## 2013-02-16 MED ORDER — METFORMIN HCL ER 500 MG PO TB24: ORAL_TABLET | ORAL | Status: DC

## 2013-04-12 ENCOUNTER — Other Ambulatory Visit: Payer: Self-pay | Admitting: *Deleted

## 2013-04-12 MED ORDER — CITALOPRAM HYDROBROMIDE 20 MG PO TABS: 20.0000 mg | ORAL_TABLET | Freq: Every day | ORAL | Status: DC

## 2013-06-08 ENCOUNTER — Other Ambulatory Visit: Payer: Self-pay

## 2013-06-14 ENCOUNTER — Other Ambulatory Visit: Payer: Self-pay

## 2013-06-14 MED ORDER — CITALOPRAM HYDROBROMIDE 20 MG PO TABS: 20.0000 mg | ORAL_TABLET | Freq: Every day | ORAL | Status: DC

## 2013-06-14 NOTE — Telephone Encounter (Signed)
Pt left v/m requesting refill citalopram to [facility] pharmacy. Last filled on 04/12/13 with note must be seen prior to more refills. Pt scheduled CPX 07/20/13.Please advise.

## 2013-07-13 ENCOUNTER — Other Ambulatory Visit (INDEPENDENT_AMBULATORY_CARE_PROVIDER_SITE_OTHER): Payer: Medicare Other

## 2013-07-13 DIAGNOSIS — I25810 Atherosclerosis of coronary artery bypass graft(s) without angina pectoris: Secondary | ICD-10-CM

## 2013-07-13 DIAGNOSIS — I10 Essential (primary) hypertension: Secondary | ICD-10-CM

## 2013-07-13 DIAGNOSIS — Z125 Encounter for screening for malignant neoplasm of prostate: Secondary | ICD-10-CM

## 2013-07-13 DIAGNOSIS — E7800 Pure hypercholesterolemia, unspecified: Secondary | ICD-10-CM

## 2013-07-13 DIAGNOSIS — E119 Type 2 diabetes mellitus without complications: Secondary | ICD-10-CM

## 2013-07-13 LAB — CBC WITH DIFFERENTIAL/PLATELET
Basophils Absolute: 0 10*3/uL (ref 0.0–0.1)
Basophils Relative: 0.7 % (ref 0.0–3.0)
Eosinophils Absolute: 0.2 10*3/uL (ref 0.0–0.7)
Eosinophils Relative: 3.7 % (ref 0.0–5.0)
HCT: 43.1 % (ref 39.0–52.0)
Hemoglobin: 14.7 g/dL (ref 13.0–17.0)
Lymphocytes Relative: 31 % (ref 12.0–46.0)
Lymphs Abs: 1.9 10*3/uL (ref 0.7–4.0)
MCHC: 34.2 g/dL (ref 30.0–36.0)
MCV: 86.7 fl (ref 78.0–100.0)
Monocytes Absolute: 0.4 10*3/uL (ref 0.1–1.0)
Monocytes Relative: 7.1 % (ref 3.0–12.0)
Neutro Abs: 3.5 10*3/uL (ref 1.4–7.7)
Neutrophils Relative %: 57.5 % (ref 43.0–77.0)
Platelets: 178 10*3/uL (ref 150.0–400.0)
RBC: 4.97 Mil/uL (ref 4.22–5.81)
RDW: 13.6 % (ref 11.5–14.6)
WBC: 6.1 10*3/uL (ref 4.5–10.5)

## 2013-07-13 LAB — COMPREHENSIVE METABOLIC PANEL WITH GFR
ALT: 26 U/L (ref 0–53)
AST: 22 U/L (ref 0–37)
Albumin: 4.7 g/dL (ref 3.5–5.2)
Alkaline Phosphatase: 64 U/L (ref 39–117)
BUN: 15 mg/dL (ref 6–23)
CO2: 28 meq/L (ref 19–32)
Calcium: 9.5 mg/dL (ref 8.4–10.5)
Chloride: 105 meq/L (ref 96–112)
Creatinine, Ser: 1 mg/dL (ref 0.4–1.5)
GFR: 76.76 mL/min
Glucose, Bld: 120 mg/dL — ABNORMAL HIGH (ref 70–99)
Potassium: 5.5 meq/L — ABNORMAL HIGH (ref 3.5–5.1)
Sodium: 136 meq/L (ref 135–145)
Total Bilirubin: 1.1 mg/dL (ref 0.3–1.2)
Total Protein: 7.2 g/dL (ref 6.0–8.3)

## 2013-07-13 LAB — TSH: TSH: 0.74 u[IU]/mL (ref 0.35–5.50)

## 2013-07-13 LAB — LIPID PANEL
Cholesterol: 148 mg/dL (ref 0–200)
HDL: 34.2 mg/dL — ABNORMAL LOW
LDL Cholesterol: 83 mg/dL (ref 0–99)
Total CHOL/HDL Ratio: 4
Triglycerides: 154 mg/dL — ABNORMAL HIGH (ref 0.0–149.0)
VLDL: 30.8 mg/dL (ref 0.0–40.0)

## 2013-07-13 LAB — PSA: PSA: 1.14 ng/mL (ref 0.10–4.00)

## 2013-07-13 LAB — MICROALBUMIN / CREATININE URINE RATIO
Creatinine,U: 91.7 mg/dL
Microalb, Ur: 1.4 mg/dL (ref 0.0–1.9)

## 2013-07-13 LAB — HEMOGLOBIN A1C: Hgb A1c MFr Bld: 6.4 % (ref 4.6–6.5)

## 2013-07-13 NOTE — Addendum Note (Signed)
Addended by: Alvina Chou on: 07/13/2013 02:11 PM   Modules accepted: Orders

## 2013-07-20 ENCOUNTER — Encounter: Payer: Self-pay | Admitting: Family Medicine

## 2013-07-20 ENCOUNTER — Ambulatory Visit (INDEPENDENT_AMBULATORY_CARE_PROVIDER_SITE_OTHER): Payer: Medicare Other | Admitting: Family Medicine

## 2013-07-20 VITALS — BP 142/72 | HR 86 | Temp 98.2°F | Ht 68.25 in | Wt 235.0 lb

## 2013-07-20 DIAGNOSIS — E119 Type 2 diabetes mellitus without complications: Secondary | ICD-10-CM

## 2013-07-20 DIAGNOSIS — I219 Acute myocardial infarction, unspecified: Secondary | ICD-10-CM

## 2013-07-20 DIAGNOSIS — S2509XD Other specified injury of thoracic aorta, subsequent encounter: Secondary | ICD-10-CM

## 2013-07-20 DIAGNOSIS — Z0000 Encounter for general adult medical examination without abnormal findings: Secondary | ICD-10-CM

## 2013-07-20 DIAGNOSIS — E785 Hyperlipidemia, unspecified: Secondary | ICD-10-CM

## 2013-07-20 DIAGNOSIS — I10 Essential (primary) hypertension: Secondary | ICD-10-CM

## 2013-07-20 DIAGNOSIS — Z5189 Encounter for other specified aftercare: Secondary | ICD-10-CM

## 2013-07-20 DIAGNOSIS — I2510 Atherosclerotic heart disease of native coronary artery without angina pectoris: Secondary | ICD-10-CM

## 2013-07-20 DIAGNOSIS — F528 Other sexual dysfunction not due to a substance or known physiological condition: Secondary | ICD-10-CM

## 2013-07-20 DIAGNOSIS — I739 Peripheral vascular disease, unspecified: Secondary | ICD-10-CM

## 2013-07-20 MED ORDER — CITALOPRAM HYDROBROMIDE 20 MG PO TABS: 20.0000 mg | ORAL_TABLET | Freq: Every day | ORAL | Status: DC

## 2013-07-20 MED ORDER — METFORMIN HCL ER 500 MG PO TB24: ORAL_TABLET | ORAL | Status: DC

## 2013-07-20 MED ORDER — LISINOPRIL 40 MG PO TABS: 40.0000 mg | ORAL_TABLET | Freq: Every day | ORAL | Status: DC

## 2013-07-20 MED ORDER — ATORVASTATIN CALCIUM 40 MG PO TABS: 40.0000 mg | ORAL_TABLET | Freq: Every day | ORAL | Status: DC

## 2013-07-20 MED ORDER — VARDENAFIL HCL 20 MG PO TABS: 20.0000 mg | ORAL_TABLET | Freq: Every day | ORAL | Status: DC | PRN

## 2013-07-20 NOTE — Progress Notes (Signed)
[facility] [address] [place] [address] Phone: [phone] Fax: [phone]  Date:  07/20/2013   Name:  [name]   DOB:  [birth year]   MRN:  [id number] Gender: male Age: 67 y.o.  Primary Physician:  Hannah Beat, MD  Evaluating MD: Hannah Beat, MD   Chief Complaint: Medicare wellness visit   History of Present Illness:  [name] is a 67 y.o. pleasant patient who presents with the following:  Medicare wellness:  Eye Exam Foot exam Zostavax (declines for now) utd on eye exam  Preventative Health Maintenance Visit:  Health Maintenance Summary Reviewed and updated, unless pt declines services.  Tobacco History Reviewed. Alcohol: No concerns, no excessive use Exercise Habits: Some activity, rec at least 30 mins 5 times a week STD concerns: no risk or activity to increase risk Drug Use: None Encouraged self-testicular check  Health Maintenance  Topic Date Due  . Influenza Vaccine  07/31/2013  . Hemoglobin A1c  01/13/2014  . Urine Microalbumin  07/13/2014  . Foot Exam  07/21/2014  . Ophthalmology Exam  07/21/2014  . Tetanus/tdap  03/03/2021  . Colonoscopy  07/20/2022  . Pneumococcal Polysaccharide Vaccine Age 49 And Over  Completed  . Zostavax  Addressed    Labs reviewed with the patient.  Results for orders placed in visit on 07/13/13  LIPID PANEL      Result Value Range   Cholesterol 148  0 - 200 mg/dL   Triglycerides 865.7 (*) 0.0 - 149.0 mg/dL   HDL 84.69 (*) >62.95 mg/dL   VLDL 28.4  0.0 - 13.2 mg/dL   LDL Cholesterol 83  0 - 99 mg/dL   Total CHOL/HDL Ratio 4    HEMOGLOBIN G4W      Result Value Range   Hemoglobin A1C 6.4  4.6 - 6.5 %  COMPREHENSIVE METABOLIC PANEL      Result Value Range   Sodium 136  135 - 145 mEq/L   Potassium 5.5 (*) 3.5 - 5.1 mEq/L   Chloride 105  96 - 112 mEq/L   CO2 28  19 - 32 mEq/L   Glucose, Bld 120 (*) 70 - 99 mg/dL   BUN 15  6 - 23 mg/dL   Creatinine, Ser 1.0  0.4 - 1.5  mg/dL   Total Bilirubin 1.1  0.3 - 1.2 mg/dL   Alkaline Phosphatase 64  39 - 117 U/L   AST 22  0 - 37 U/L   ALT 26  0 - 53 U/L   Total Protein 7.2  6.0 - 8.3 g/dL   Albumin 4.7  3.5 - 5.2 g/dL   Calcium 9.5  8.4 - 10.2 mg/dL   GFR 72.53  >66.44 mL/min  TSH      Result Value Range   TSH 0.74  0.35 - 5.50 uIU/mL  PSA      Result Value Range   PSA 1.14  0.10 - 4.00 ng/mL  CBC WITH DIFFERENTIAL      Result Value Range   WBC 6.1  4.5 - 10.5 K/uL   RBC 4.97  4.22 - 5.81 Mil/uL   Hemoglobin 14.7  13.0 - 17.0 g/dL   HCT 03.4  74.2 - 59.5 %   MCV 86.7  78.0 - 100.0 fl   MCHC 34.2  30.0 - 36.0 g/dL   RDW 63.8  75.6 - 43.3 %   Platelets 178.0  150.0 - 400.0 K/uL   Neutrophils Relative % 57.5  43.0 - 77.0 %  Lymphocytes Relative 31.0  12.0 - 46.0 %   Monocytes Relative 7.1  3.0 - 12.0 %   Eosinophils Relative 3.7  0.0 - 5.0 %   Basophils Relative 0.7  0.0 - 3.0 %   Neutro Abs 3.5  1.4 - 7.7 K/uL   Lymphs Abs 1.9  0.7 - 4.0 K/uL   Monocytes Absolute 0.4  0.1 - 1.0 K/uL   Eosinophils Absolute 0.2  0.0 - 0.7 K/uL   Basophils Absolute 0.0  0.0 - 0.1 K/uL  MICROALBUMIN / CREATININE URINE RATIO      Result Value Range   Microalb, Ur 1.4  0.0 - 1.9 mg/dL   Creatinine,U 16.1     Microalb Creat Ratio 1.5  0.0 - 30.0 mg/g   Diabetes Mellitus: Tolerating Medications: yes Compliance with diet: fair Exercise: fair Avg blood sugars at home: 90-130 Foot problems: none Hypoglycemia: none No nausea, vomitting, blurred vision, polyuria.  Lab Results  Component Value Date   HGBA1C 6.4 07/13/2013    Wt Readings from Last 3 Encounters:  07/20/13 235 lb (106.595 kg)  11/24/12 236 lb 12 oz (107.389 kg)  07/25/12 228 lb (103.42 kg)   HTN: Tolerating all medications without side effects Stable and at goal No CP, no sob. No HA.  BP Readings from Last 3 Encounters:  07/20/13 142/72  11/24/12 140/88  07/25/12 146/80    Basic Metabolic Panel:    Component Value Date/Time   NA 136  07/13/2013 1200   K 5.5* 07/13/2013 1200   CL 105 07/13/2013 1200   CO2 28 07/13/2013 1200   BUN 15 07/13/2013 1200   CREATININE 1.0 07/13/2013 1200   GLUCOSE 120* 07/13/2013 1200   CALCIUM 9.5 07/13/2013 1200     Lipids: Doing well, stable. Tolerating meds fine with no SE. Panel reviewed with patient.  Lipids:    Component Value Date/Time   CHOL 148 07/13/2013 1200   TRIG 154.0* 07/13/2013 1200   HDL 34.20* 07/13/2013 1200   LDLDIRECT 173.5 05/23/2012 1215   VLDL 30.8 07/13/2013 1200   CHOLHDL 4 07/13/2013 1200    Lab Results  Component Value Date   ALT 26 07/13/2013   AST 22 07/13/2013   ALKPHOS 64 07/13/2013   BILITOT 1.1 07/13/2013    Levitra is working well for ED  Multiple skin lesions taken off 6 mo ago at dermatologist  Body mass index is 35.45 kg/(m^2).   Patient Active Problem List   Diagnosis Date Noted  . Anemia associated with acute blood loss 04/30/2011  . Duodenal ulcer with hemorrhage 04/30/2011  . DIABETES MELLITUS, TYPE II 02/13/2010  . HYPERLIPIDEMIA 08/16/2008  . ERECTILE DYSFUNCTION 08/16/2008  . DEPRESSION 08/16/2008  . HYPERTENSION 08/16/2008  . CORONARY ARTERY DISEASE 08/16/2008    Past Medical History  Diagnosis Date  . CAD (coronary artery disease)   . MI (myocardial infarction) 1992    Acute anterior MI, thrombolytic therapy  . Diabetes mellitus     Type II  . Depression   . Hyperlipidemia     Statin with joint pain   . Hypertension   . Peripheral vascular disease     Atherosclerotic:R renal artery stenosis  . Fall 2005    fell off house: torn aorta, clavicle fracture, rib fracture, vertebral fractures, lung contusion, coma x 2 weeks  . Erectile dysfunction   . Aortic transection 2005    Traumatic after a fall from a second floor. s/p repair at [facility]  . Gallstone pancreatitis   .  History of bleeding ulcers     Past Surgical History  Procedure Laterality Date  . Cholecystectomy    . Cardiovascular surgery      with ruptured Aorta, Dr.  Meyer Russel, [facility]   . Knee surgery      Right   . Thoracotomy      thoracic aorta repair   . Tracheostomy      s/p reversal  . Cardiac catheterization  08/1991    50 % mid-Lad stenosis with clot, 25-505 second marginal    History   Social History  . Marital Status: Married    Spouse Name: N/A    Number of Children: N/A  . Years of Education: N/A   Occupational History  . Build in [place]    Social History Main Topics  . Smoking status: Former Smoker -- 1.00 packs/day    Types: Cigarettes  . Smokeless tobacco: Never Used     Comment: quit post MI  . Alcohol Use: Yes     Comment: occassionally  . Drug Use: No  . Sexual Activity: Not on file   Other Topics Concern  . Not on file   Social History Narrative   No regular exercise     Family History  Problem Relation Age of Onset  . Dementia Mother 85  . Heart attack Father 64  . Diabetes Brother 18  . Heart attack Brother   . Colon cancer Neg Hx   . Stomach cancer Neg Hx   . Esophageal cancer Neg Hx   . Rectal cancer Neg Hx     Allergies  Allergen Reactions  . Dye Fdc Red [Red Dye] Rash  . Penicillins Rash    Medication list has been reviewed and updated.  Outpatient Prescriptions Prior to Visit  Medication Sig Dispense Refill  . aspirin 81 MG tablet Take 81 mg by mouth daily.      [name] Kitchen atorvastatin (LIPITOR) 40 MG tablet Take 1 tablet (40 mg total) by mouth daily.  30 tablet  11  . citalopram (CELEXA) 20 MG tablet Take 1 tablet (20 mg total) by mouth daily.  30 tablet  2  . glucose blood test strip Use as instructed  100 each  12  . lisinopril (PRINIVIL,ZESTRIL) 40 MG tablet Take 1 tablet (40 mg total) by mouth daily.  30 tablet  11  . metFORMIN (GLUCOPHAGE-XR) 500 MG 24 hr tablet TAKE 1 TABLET EVERY DAY  30 tablet  5  . vardenafil (LEVITRA) 20 MG tablet Take 1 tablet (20 mg total) by mouth daily as needed for erectile dysfunction. 1 by mouth 30 min before intercourse  10 tablet  11  . ranitidine  (ZANTAC) 150 MG tablet Take 150 mg by mouth 2 (two) times daily.         No facility-administered medications prior to visit.    Review of Systems:   General: Denies fever, chills, sweats. No significant weight loss. Eyes: Denies blurring,significant itching ENT: Denies earache, sore throat, and hoarseness. Cardiovascular: Denies chest pains, palpitations, dyspnea on exertion Respiratory: Denies cough, dyspnea at rest,wheeezing Breast: no concerns about lumps GI: Denies nausea, vomiting, diarrhea, constipation, change in bowel habits, abdominal pain, melena, hematochezia GU: Denies penile discharge, some ED, no urinary flow / outflow problems. No STD concerns. Musculoskeletal: Denies back pain, joint pain Derm: multiple skin lesions and skin damage Neuro: Denies  paresthesias, frequent falls, frequent headaches Psych: Denies depression, anxiety Endocrine: Denies cold intolerance, heat intolerance, polydipsia Heme: Denies enlarged lymph nodes Allergy:  No hayfever   Physical Examination: BP 142/72  Pulse 86  Temp(Src) 98.2 F (36.8 C)  Ht 5' 8.25" (1.734 m)  Wt 235 lb (106.595 kg)  BMI 35.45 kg/m2  Ideal Body Weight: Weight in (lb) to have BMI = 25: 165.3   Wt Readings from Last 3 Encounters:  07/20/13 235 lb (106.595 kg)  11/24/12 236 lb 12 oz (107.389 kg)  07/25/12 228 lb (103.42 kg)    GEN: well developed, well nourished, no acute distress Eyes: conjunctiva and lids normal, PERRLA, EOMI ENT: TM clear, nares clear, oral exam WNL Neck: supple, no lymphadenopathy, no thyromegaly, no JVD Pulm: clear to auscultation and percussion, respiratory effort normal CV: regular rate and rhythm, S1-S2, no murmur, rub or gallop, no bruits, peripheral pulses normal and symmetric, no cyanosis, clubbing, edema or varicosities Chest: no scars, masses GI: soft, non-tender; no hepatosplenomegaly, masses; active bowel sounds all quadrants GU: no hernia, testicular mass, penile discharge,  or prostate enlargement Lymph: no cervical, axillary or inguinal adenopathy MSK: gait normal, muscle tone and strength WNL, no joint swelling, effusions, discoloration, crepitus  SKIN: GREAT DEAL OF SKIN DAMAGE, ARMS, FACE Neuro: normal mental status, normal strength, sensation, and motion Psych: alert; oriented to person, place and time, normally interactive and not anxious or depressed in appearance.   Assessment and Plan:  Routine general medical examination at a health care facility  DIABETES MELLITUS, TYPE II: stable, refill meds. Doing well with regards to his DM. Wrote out a script for new meter, strips, and lancets. Bid checking as needed  HYPERLIPIDEMIA, stable, refill meds  ERECTILE DYSFUNCTION: stable, refill levitra  HYPERTENSION: relatively stable  CORONARY ARTERY DISEASE  I have personally reviewed the Medicare Annual Wellness questionnaire and have noted 1. The patient's medical and social history 2. Their use of alcohol, tobacco or illicit drugs 3. Their current medications and supplements 4. The patient's functional ability including ADL's, fall risks, home safety risks and hearing or visual             impairment. 5. Diet and physical activities 6. Evidence for depression or mood disorders  The patients weight, height, BMI and visual acuity have been recorded in the chart I have made referrals, counseling and provided education to the patient based review of the above and I have provided the pt with a written personalized care plan for preventive services.  I have provided the patient with a copy of your personalized plan for preventive services. Instructed to take the time to review along with their updated medication list.   Orders Today:  No orders of the defined types were placed in this encounter.    Updated Medication List: (Includes new medications, updates to list, dose adjustments) Meds ordered this encounter  Medications  . atorvastatin (LIPITOR)  40 MG tablet    Sig: Take 1 tablet (40 mg total) by mouth daily.    Dispense:  30 tablet    Refill:  11  . citalopram (CELEXA) 20 MG tablet    Sig: Take 1 tablet (20 mg total) by mouth daily.    Dispense:  30 tablet    Refill:  11  . lisinopril (PRINIVIL,ZESTRIL) 40 MG tablet    Sig: Take 1 tablet (40 mg total) by mouth daily.    Dispense:  30 tablet    Refill:  11  . metFORMIN (GLUCOPHAGE-XR) 500 MG 24 hr tablet    Sig: TAKE 1 TABLET EVERY DAY    Dispense:  30 tablet  Refill:  11  . vardenafil (LEVITRA) 20 MG tablet    Sig: Take 1 tablet (20 mg total) by mouth daily as needed for erectile dysfunction. 1 by mouth 30 min before intercourse    Dispense:  10 tablet    Refill:  11    Medications Discontinued: Medications Discontinued During This Encounter  Medication Reason  . ranitidine (ZANTAC) 150 MG tablet Error  . atorvastatin (LIPITOR) 40 MG tablet Reorder  . citalopram (CELEXA) 20 MG tablet Reorder  . lisinopril (PRINIVIL,ZESTRIL) 40 MG tablet Reorder  . metFORMIN (GLUCOPHAGE-XR) 500 MG 24 hr tablet Reorder  . vardenafil (LEVITRA) 20 MG tablet Reorder      Signed, Karleen Hampshire T. Marchele Decock, MD 07/20/2013 2:54 PM

## 2013-07-20 NOTE — Patient Instructions (Signed)
zostavax (SHINGLES VACCINE) -- go to one of the pharmacies that has a sign that says "Shingles Vaccines" here. You can go there and they will check with your insurance and they can give it to you there. 

## 2013-07-21 ENCOUNTER — Encounter: Payer: Self-pay | Admitting: Family Medicine

## 2013-07-21 DIAGNOSIS — S2509XA Other specified injury of thoracic aorta, initial encounter: Secondary | ICD-10-CM | POA: Insufficient documentation

## 2013-07-21 DIAGNOSIS — I739 Peripheral vascular disease, unspecified: Secondary | ICD-10-CM | POA: Insufficient documentation

## 2013-07-21 DIAGNOSIS — I252 Old myocardial infarction: Secondary | ICD-10-CM | POA: Insufficient documentation

## 2013-08-31 ENCOUNTER — Ambulatory Visit: Payer: Medicare Other | Admitting: Cardiovascular Disease

## 2013-10-05 ENCOUNTER — Other Ambulatory Visit: Payer: Self-pay

## 2013-10-11 ENCOUNTER — Ambulatory Visit (INDEPENDENT_AMBULATORY_CARE_PROVIDER_SITE_OTHER): Payer: Medicare Other

## 2013-10-11 DIAGNOSIS — Z23 Encounter for immunization: Secondary | ICD-10-CM

## 2014-03-08 ENCOUNTER — Other Ambulatory Visit: Payer: Self-pay

## 2014-07-25 ENCOUNTER — Other Ambulatory Visit: Payer: Self-pay | Admitting: Family Medicine

## 2014-08-01 ENCOUNTER — Other Ambulatory Visit: Payer: Self-pay | Admitting: Family Medicine

## 2014-08-09 ENCOUNTER — Inpatient Hospital Stay: Payer: Self-pay | Admitting: Internal Medicine

## 2014-08-09 DIAGNOSIS — S52132A Displaced fracture of neck of left radius, initial encounter for closed fracture: Secondary | ICD-10-CM

## 2014-08-09 DIAGNOSIS — Z87448 Personal history of other diseases of urinary system: Secondary | ICD-10-CM

## 2014-08-09 DIAGNOSIS — S72002A Fracture of unspecified part of neck of left femur, initial encounter for closed fracture: Secondary | ICD-10-CM

## 2014-08-09 HISTORY — DX: Personal history of other diseases of urinary system: Z87.448

## 2014-08-09 HISTORY — DX: Displaced fracture of neck of left radius, initial encounter for closed fracture: S52.132A

## 2014-08-09 HISTORY — DX: Fracture of unspecified part of neck of left femur, initial encounter for closed fracture: S72.002A

## 2014-08-09 LAB — CBC WITH DIFFERENTIAL/PLATELET
Basophil #: 0.1 10*3/uL
Basophil %: 0.7 %
Eosinophil #: 0.1 10*3/uL
Eosinophil %: 1.1 %
HCT: 43.6 %
HGB: 14.9 g/dL
Lymphocyte %: 11.6 %
Lymphs Abs: 1 10*3/uL
MCH: 30.1 pg
MCHC: 34.2 g/dL
MCV: 88 fL
Monocyte #: 0.4 10*3/uL
Monocyte %: 5.3 %
Neutrophil #: 6.9 10*3/uL — ABNORMAL HIGH
Neutrophil %: 81.3 %
Platelet: 168 10*3/uL
RBC: 4.95 x10 6/mm 3
RDW: 13.4 %
WBC: 8.4 10*3/uL

## 2014-08-09 LAB — COMPREHENSIVE METABOLIC PANEL WITH GFR
Albumin: 4.2 g/dL
Alkaline Phosphatase: 75 U/L
Anion Gap: 5 — ABNORMAL LOW
BUN: 21 mg/dL — ABNORMAL HIGH
Bilirubin,Total: 0.7 mg/dL
Calcium, Total: 8.7 mg/dL
Chloride: 110 mmol/L — ABNORMAL HIGH
Co2: 24 mmol/L
Creatinine: 1.33 mg/dL — ABNORMAL HIGH
EGFR (African American): >60
EGFR (Non-African Amer.): 55 — ABNORMAL LOW
Glucose: 147 mg/dL — ABNORMAL HIGH
Osmolality: 283
Potassium: 5.5 mmol/L — ABNORMAL HIGH
SGOT(AST): 28 U/L
SGPT (ALT): 37 U/L
Sodium: 139 mmol/L
Total Protein: 7.5 g/dL

## 2014-08-09 LAB — POTASSIUM: Potassium: 6.3 mmol/L — ABNORMAL HIGH

## 2014-08-10 LAB — LIPID PANEL
Cholesterol: 120 mg/dL
HDL Cholesterol: 29 mg/dL — ABNORMAL LOW
Ldl Cholesterol, Calc: 58 mg/dL
Triglycerides: 164 mg/dL
VLDL Cholesterol, Calc: 33 mg/dL

## 2014-08-10 LAB — BASIC METABOLIC PANEL WITH GFR
Anion Gap: 8
Anion Gap: 7
Anion Gap: 8
BUN: 32 mg/dL — ABNORMAL HIGH
BUN: 35 mg/dL — ABNORMAL HIGH
BUN: 31 mg/dL — ABNORMAL HIGH
Calcium, Total: 8.4 mg/dL — ABNORMAL LOW
Calcium, Total: 7.9 mg/dL — ABNORMAL LOW
Calcium, Total: 8.2 mg/dL — ABNORMAL LOW
Chloride: 107 mmol/L
Chloride: 112 mmol/L — ABNORMAL HIGH
Chloride: 110 mmol/L — ABNORMAL HIGH
Co2: 23 mmol/L
Co2: 22 mmol/L
Co2: 22 mmol/L
Creatinine: 2.78 mg/dL — ABNORMAL HIGH
Creatinine: 3.55 mg/dL — ABNORMAL HIGH
Creatinine: 4.11 mg/dL — ABNORMAL HIGH
EGFR (African American): 26 — ABNORMAL LOW
EGFR (African American): 19 — ABNORMAL LOW
EGFR (African American): 16 — ABNORMAL LOW
EGFR (Non-African Amer.): 23 — ABNORMAL LOW
EGFR (Non-African Amer.): 17 — ABNORMAL LOW
EGFR (Non-African Amer.): 14 — ABNORMAL LOW
Glucose: 154 mg/dL — ABNORMAL HIGH
Glucose: 141 mg/dL — ABNORMAL HIGH
Glucose: 121 mg/dL — ABNORMAL HIGH
Osmolality: 286
Osmolality: 292
Osmolality: 287
Potassium: 6 mmol/L — ABNORMAL HIGH
Potassium: 6.1 mmol/L — ABNORMAL HIGH
Potassium: 5.6 mmol/L — ABNORMAL HIGH
Sodium: 138 mmol/L
Sodium: 141 mmol/L
Sodium: 140 mmol/L

## 2014-08-10 LAB — RENAL FUNCTION PANEL
Albumin: 3.7 g/dL
Anion Gap: 9
BUN: 38 mg/dL — ABNORMAL HIGH
Calcium, Total: 8 mg/dL — ABNORMAL LOW
Chloride: 111 mmol/L — ABNORMAL HIGH
Co2: 20 mmol/L — ABNORMAL LOW
Creatinine: 3.56 mg/dL — ABNORMAL HIGH
EGFR (African American): 19 — ABNORMAL LOW
EGFR (Non-African Amer.): 17 — ABNORMAL LOW
Glucose: 120 mg/dL — ABNORMAL HIGH
Osmolality: 290
Phosphorus: 5.4 mg/dL — ABNORMAL HIGH
Potassium: 5 mmol/L
Sodium: 140 mmol/L

## 2014-08-10 LAB — HEPATIC FUNCTION PANEL A (ARMC)
Albumin: 4.1 g/dL
Alkaline Phosphatase: 71 U/L
Bilirubin, Direct: 0.1 mg/dL
Bilirubin,Total: 0.7 mg/dL
SGOT(AST): 18 U/L
SGPT (ALT): 32 U/L
Total Protein: 6.9 g/dL

## 2014-08-10 LAB — CBC WITH DIFFERENTIAL/PLATELET
Basophil #: 0.1 10*3/uL
Basophil %: 0.5 %
Eosinophil #: 0.1 10*3/uL
Eosinophil %: 0.6 %
HCT: 41.5 %
HGB: 13.5 g/dL
Lymphocyte %: 12.4 %
Lymphs Abs: 1.3 10*3/uL
MCH: 29.5 pg
MCHC: 32.6 g/dL
MCV: 91 fL
Monocyte #: 1.1 10*3/uL — ABNORMAL HIGH
Monocyte %: 10.1 %
Neutrophil #: 8.1 10*3/uL — ABNORMAL HIGH
Neutrophil %: 76.4 %
Platelet: 180 10*3/uL
RBC: 4.59 x10 6/mm 3
RDW: 13.7 %
WBC: 10.5 10*3/uL

## 2014-08-10 LAB — URINALYSIS, COMPLETE
Bacteria: NONE SEEN
Bilirubin,UR: NEGATIVE
Blood: NEGATIVE
Glucose,UR: NEGATIVE mg/dL
Hyaline Cast: 1
Ketone: NEGATIVE
Leukocyte Esterase: NEGATIVE
Nitrite: NEGATIVE
Ph: 5
Protein: NEGATIVE
RBC,UR: 1 /HPF
Specific Gravity: 1.014
Squamous Epithelial: NONE SEEN
WBC UR: <1 /HPF

## 2014-08-10 LAB — CK: CK, Total: 255 U/L

## 2014-08-11 LAB — BASIC METABOLIC PANEL WITH GFR
Anion Gap: 10
Anion Gap: 6 — ABNORMAL LOW
BUN: 41 mg/dL — ABNORMAL HIGH
BUN: 27 mg/dL — ABNORMAL HIGH
Calcium, Total: 7.9 mg/dL — ABNORMAL LOW
Calcium, Total: 7.6 mg/dL — ABNORMAL LOW
Chloride: 107 mmol/L
Chloride: 106 mmol/L
Co2: 25 mmol/L
Co2: 27 mmol/L
Creatinine: 2.92 mg/dL — ABNORMAL HIGH
Creatinine: 1.97 mg/dL — ABNORMAL HIGH
EGFR (African American): 25 — ABNORMAL LOW
EGFR (African American): 40 — ABNORMAL LOW
EGFR (Non-African Amer.): 21 — ABNORMAL LOW
EGFR (Non-African Amer.): 34 — ABNORMAL LOW
Glucose: 141 mg/dL — ABNORMAL HIGH
Glucose: 125 mg/dL — ABNORMAL HIGH
Osmolality: 296
Osmolality: 284
Potassium: 4.8 mmol/L
Potassium: 4.3 mmol/L
Sodium: 142 mmol/L
Sodium: 139 mmol/L

## 2014-08-12 DIAGNOSIS — I517 Cardiomegaly: Secondary | ICD-10-CM

## 2014-08-12 HISTORY — PX: ORIF HIP FRACTURE: SHX2125

## 2014-08-12 LAB — BASIC METABOLIC PANEL WITH GFR
Anion Gap: 6 — ABNORMAL LOW
BUN: 38 mg/dL — ABNORMAL HIGH
Calcium, Total: 7.5 mg/dL — ABNORMAL LOW
Chloride: 106 mmol/L
Co2: 28 mmol/L
Creatinine: 2.19 mg/dL — ABNORMAL HIGH
EGFR (African American): 35 — ABNORMAL LOW
EGFR (Non-African Amer.): 30 — ABNORMAL LOW
Glucose: 132 mg/dL — ABNORMAL HIGH
Osmolality: 290
Potassium: 4.2 mmol/L
Sodium: 140 mmol/L

## 2014-08-12 LAB — CBC WITH DIFFERENTIAL/PLATELET
Basophil #: 0 10*3/uL
Basophil %: 0.2 %
Eosinophil #: 0.2 10*3/uL
Eosinophil %: 1.4 %
HCT: 34.4 % — ABNORMAL LOW
HGB: 11.7 g/dL — ABNORMAL LOW
Lymphocyte %: 7.4 %
Lymphs Abs: 1 10*3/uL
MCH: 30.6 pg
MCHC: 34.1 g/dL
MCV: 90 fL
Monocyte #: 0.9 10*3/uL
Monocyte %: 6.9 %
Neutrophil #: 11.6 10*3/uL — ABNORMAL HIGH
Neutrophil %: 84.1 %
Platelet: 106 10*3/uL — ABNORMAL LOW
RBC: 3.83 x10 6/mm 3 — ABNORMAL LOW
RDW: 13.2 %
WBC: 13.8 10*3/uL — ABNORMAL HIGH

## 2014-08-13 LAB — BASIC METABOLIC PANEL WITH GFR
Anion Gap: 10
BUN: 30 mg/dL — ABNORMAL HIGH
Calcium, Total: 7.4 mg/dL — ABNORMAL LOW
Chloride: 105 mmol/L
Co2: 27 mmol/L
Creatinine: 1.67 mg/dL — ABNORMAL HIGH
EGFR (African American): 48 — ABNORMAL LOW
EGFR (Non-African Amer.): 42 — ABNORMAL LOW
Glucose: 132 mg/dL — ABNORMAL HIGH
Osmolality: 291
Potassium: 3.9 mmol/L
Sodium: 142 mmol/L

## 2014-08-13 LAB — CBC WITH DIFFERENTIAL/PLATELET
Basophil #: 0 10*3/uL
Basophil %: 0.2 %
Eosinophil #: 0.2 10*3/uL
Eosinophil %: 2.1 %
HCT: 32.8 % — ABNORMAL LOW
HGB: 11.3 g/dL — ABNORMAL LOW
Lymphocyte %: 6.4 %
Lymphs Abs: 0.7 10*3/uL — ABNORMAL LOW
MCH: 30.7 pg
MCHC: 34.3 g/dL
MCV: 89 fL
Monocyte #: 0.8 10*3/uL
Monocyte %: 6.6 %
Neutrophil #: 9.7 10*3/uL — ABNORMAL HIGH
Neutrophil %: 84.7 %
Platelet: 110 10*3/uL — ABNORMAL LOW
RBC: 3.67 x10 6/mm 3 — ABNORMAL LOW
RDW: 13.3 %
WBC: 11.4 10*3/uL — ABNORMAL HIGH

## 2014-08-14 ENCOUNTER — Encounter: Payer: Self-pay | Admitting: Internal Medicine

## 2014-08-14 LAB — CBC WITH DIFFERENTIAL/PLATELET
Basophil #: 0 10*3/uL
Basophil %: 0.2 %
Eosinophil #: 0.4 10*3/uL
Eosinophil %: 3.6 %
HCT: 27 % — ABNORMAL LOW
HGB: 9.2 g/dL — ABNORMAL LOW
Lymphocyte %: 5.8 %
Lymphs Abs: 0.6 10*3/uL — ABNORMAL LOW
MCH: 30.3 pg
MCHC: 34.2 g/dL
MCV: 89 fL
Monocyte #: 0.6 10*3/uL
Monocyte %: 6.5 %
Neutrophil #: 8.1 10*3/uL — ABNORMAL HIGH
Neutrophil %: 83.9 %
Platelet: 108 10*3/uL — ABNORMAL LOW
RBC: 3.04 x10 6/mm 3 — ABNORMAL LOW
RDW: 13.1 %
WBC: 9.7 10*3/uL

## 2014-08-14 LAB — BASIC METABOLIC PANEL WITH GFR
Anion Gap: 5 — ABNORMAL LOW
BUN: 29 mg/dL — ABNORMAL HIGH
Calcium, Total: 7.2 mg/dL — ABNORMAL LOW
Chloride: 103 mmol/L
Co2: 30 mmol/L
Creatinine: 1.54 mg/dL — ABNORMAL HIGH
EGFR (African American): 53 — ABNORMAL LOW
EGFR (Non-African Amer.): 46 — ABNORMAL LOW
Glucose: 128 mg/dL — ABNORMAL HIGH
Osmolality: 283
Potassium: 3.2 mmol/L — ABNORMAL LOW
Sodium: 138 mmol/L

## 2014-08-14 LAB — UR PROT ELECTROPHORESIS, URINE RANDOM

## 2014-08-15 LAB — CBC WITH DIFFERENTIAL/PLATELET
Basophil #: 0 10*3/uL
Basophil %: 0.1 %
Eosinophil #: 0 10*3/uL
Eosinophil %: 0.5 %
HCT: 27 % — ABNORMAL LOW
HGB: 9.3 g/dL — ABNORMAL LOW
Lymphocyte %: 5.4 %
Lymphs Abs: 0.5 10*3/uL — ABNORMAL LOW
MCH: 30.5 pg
MCHC: 34.3 g/dL
MCV: 89 fL
Monocyte #: 0.6 10*3/uL
Monocyte %: 7.2 %
Neutrophil #: 7.3 10*3/uL — ABNORMAL HIGH
Neutrophil %: 86.8 %
Platelet: 146 10*3/uL — ABNORMAL LOW
RBC: 3.03 x10 6/mm 3 — ABNORMAL LOW
RDW: 12.9 %
WBC: 8.4 10*3/uL

## 2014-08-15 LAB — BASIC METABOLIC PANEL WITH GFR
Anion Gap: 5 — ABNORMAL LOW
BUN: 23 mg/dL — ABNORMAL HIGH
Calcium, Total: 7.9 mg/dL — ABNORMAL LOW
Chloride: 105 mmol/L
Co2: 31 mmol/L
Creatinine: 1.28 mg/dL
EGFR (African American): >60
EGFR (Non-African Amer.): 58 — ABNORMAL LOW
Glucose: 157 mg/dL — ABNORMAL HIGH
Osmolality: 288
Potassium: 3.5 mmol/L
Sodium: 141 mmol/L

## 2014-08-15 LAB — PROTEIN ELECTROPHORESIS(ARMC)

## 2014-08-21 LAB — COMPREHENSIVE METABOLIC PANEL WITH GFR
Albumin: 3.1 g/dL — ABNORMAL LOW
Alkaline Phosphatase: 69 U/L
Anion Gap: 6 — ABNORMAL LOW
BUN: 15 mg/dL
Bilirubin,Total: 1 mg/dL
Calcium, Total: 8 mg/dL — ABNORMAL LOW
Chloride: 107 mmol/L
Co2: 27 mmol/L
Creatinine: 1.06 mg/dL
EGFR (African American): >60
EGFR (Non-African Amer.): >60
Glucose: 127 mg/dL — ABNORMAL HIGH
Osmolality: 282
Potassium: 4.3 mmol/L
SGOT(AST): 39 U/L — ABNORMAL HIGH
SGPT (ALT): 49 U/L
Sodium: 140 mmol/L
Total Protein: 5.8 g/dL — ABNORMAL LOW

## 2014-08-21 LAB — CBC WITH DIFFERENTIAL/PLATELET
Basophil #: 0 10*3/uL
Basophil %: 0.4 %
Eosinophil #: 0.3 10*3/uL
Eosinophil %: 2.9 %
HCT: 31.2 % — ABNORMAL LOW
HGB: 10.2 g/dL — ABNORMAL LOW
Lymphocyte %: 11.6 %
Lymphs Abs: 1.2 10*3/uL
MCH: 30.1 pg
MCHC: 32.7 g/dL
MCV: 92 fL
Monocyte #: 0.4 10*3/uL
Monocyte %: 4 %
Neutrophil #: 8.4 10*3/uL — ABNORMAL HIGH
Neutrophil %: 81.1 %
Platelet: 348 10*3/uL
RBC: 3.4 x10 6/mm 3 — ABNORMAL LOW
RDW: 14 %
WBC: 10.4 10*3/uL

## 2014-08-23 ENCOUNTER — Telehealth: Payer: Self-pay | Admitting: Family Medicine

## 2014-08-23 DIAGNOSIS — R0600 Dyspnea, unspecified: Secondary | ICD-10-CM

## 2014-08-23 DIAGNOSIS — J441 Chronic obstructive pulmonary disease with (acute) exacerbation: Secondary | ICD-10-CM

## 2014-08-23 DIAGNOSIS — Z87891 Personal history of nicotine dependence: Secondary | ICD-10-CM

## 2014-08-23 DIAGNOSIS — J438 Other emphysema: Secondary | ICD-10-CM

## 2014-08-23 LAB — CBC WITH DIFFERENTIAL/PLATELET
Basophil #: 0 10*3/uL
Basophil %: 0.5 %
Eosinophil #: 0.3 10*3/uL
Eosinophil %: 4.1 %
HCT: 31.5 % — ABNORMAL LOW
HGB: 10.6 g/dL — ABNORMAL LOW
Lymphocyte %: 11.7 %
Lymphs Abs: 1 10*3/uL
MCH: 31 pg
MCHC: 33.7 g/dL
MCV: 92 fL
Monocyte #: 0.5 10*3/uL
Monocyte %: 5.6 %
Neutrophil #: 6.5 10*3/uL
Neutrophil %: 78.1 %
Platelet: 328 10*3/uL
RBC: 3.42 x10 6/mm 3 — ABNORMAL LOW
RDW: 14.3 %
WBC: 8.4 10*3/uL

## 2014-08-23 LAB — COMPREHENSIVE METABOLIC PANEL WITH GFR
Albumin: 3.2 g/dL — ABNORMAL LOW
Alkaline Phosphatase: 73 U/L
Anion Gap: 6 — ABNORMAL LOW
BUN: 14 mg/dL
Bilirubin,Total: 0.9 mg/dL
Calcium, Total: 8.2 mg/dL — ABNORMAL LOW
Chloride: 109 mmol/L — ABNORMAL HIGH
Co2: 25 mmol/L
Creatinine: 0.99 mg/dL
EGFR (African American): >60
EGFR (Non-African Amer.): >60
Glucose: 129 mg/dL — ABNORMAL HIGH
Osmolality: 282
Potassium: 4.7 mmol/L
SGOT(AST): 27 U/L
SGPT (ALT): 41 U/L
Sodium: 140 mmol/L
Total Protein: 6 g/dL — ABNORMAL LOW

## 2014-08-23 NOTE — Telephone Encounter (Signed)
Pt's wife calling stating pt is now at [facility] for broken hip that happened 08/09/2014. Pt was moved to [place] on 08/15/2014. Pt's wife states pt needs a referral to a Pulmonlogist. Pt having SOB daily, some better. [place] suggested a referral. Pt;s wife is overwhelmed and feels pt is slowly going down hill. Pt has suffered panic attacks. Please advise. Thank you

## 2014-08-23 NOTE — Telephone Encounter (Signed)
I had a long conversation with the patient's wife. He fell and sustained a hip fracture. While hospitalized, he developed acute renal failure and had to go on dialysis for 2 days. Since then his creatinine has improved, she reports that his creatinine is at approximately 2.  He also has had very significant shortness of breath, difficulty breathing, and he is now requiring 3 inhalers, and he is also on oxygen. No these are baseline.  The staff at his nursing home is requested that I make a pulmonology consult for outpatient Pulmonology.

## 2014-08-30 ENCOUNTER — Encounter: Payer: Self-pay | Admitting: Internal Medicine

## 2014-09-05 ENCOUNTER — Other Ambulatory Visit: Payer: Commercial Managed Care - HMO

## 2014-09-06 ENCOUNTER — Other Ambulatory Visit: Payer: Medicare Other

## 2014-09-06 ENCOUNTER — Telehealth: Payer: Self-pay | Admitting: *Deleted

## 2014-09-06 MED ORDER — TRAMADOL HCL 50 MG PO TABS: 50.0000 mg | ORAL_TABLET | Freq: Four times a day (QID) | ORAL | Status: DC | PRN

## 2014-09-06 NOTE — Telephone Encounter (Signed)
Tramadol 50 mg, 1 po q 6 hours prn pain, #50, 2 refills   Also, can you convert his upcoming CPX appt to a 30 minute hospital follow-up appointment. There is no [place] I can do all in 1 office visit. Recent complex hospitalization. We will need [facility] records, too.

## 2014-09-06 NOTE — Telephone Encounter (Signed)
Tramaol called in to [facility].  Appointment changed to Hospital Follow Up.  Records requested from [facility].

## 2014-09-06 NOTE — Telephone Encounter (Signed)
Received fax from [facility] stating patient was released form rehab and is requesting refill for Tramadol 50 mg.  Not on current medication list. Last office visit 07/20/2013.  Ok to refill?

## 2014-09-11 ENCOUNTER — Telehealth: Payer: Self-pay | Admitting: Family Medicine

## 2014-09-11 NOTE — Telephone Encounter (Signed)
Manus Rudd (pt's physical therapist) at [facility] would like to see if you could order L elbow x-ray for pt? 6 weeks ago pt fell and broke left hip and that is what she is primarily helping him with but [place] is complaining of sever left elbow pain. Pt is coming in tomorrow 09/12/14 for annual cpe.  Caryl Pina call back # [phone]

## 2014-09-12 ENCOUNTER — Encounter: Payer: Self-pay | Admitting: Family Medicine

## 2014-09-12 ENCOUNTER — Encounter: Payer: Self-pay | Admitting: Pulmonary Disease

## 2014-09-12 ENCOUNTER — Telehealth: Payer: Self-pay

## 2014-09-12 ENCOUNTER — Ambulatory Visit (INDEPENDENT_AMBULATORY_CARE_PROVIDER_SITE_OTHER): Payer: Commercial Managed Care - HMO | Admitting: Family Medicine

## 2014-09-12 ENCOUNTER — Ambulatory Visit (INDEPENDENT_AMBULATORY_CARE_PROVIDER_SITE_OTHER): Payer: Commercial Managed Care - HMO | Admitting: Pulmonary Disease

## 2014-09-12 ENCOUNTER — Ambulatory Visit (INDEPENDENT_AMBULATORY_CARE_PROVIDER_SITE_OTHER)
Admission: RE | Admit: 2014-09-12 | Discharge: 2014-09-12 | Disposition: A | Discharge status: Home / Self Care | Payer: Commercial Managed Care - HMO | Source: Ambulatory Visit | Attending: Family Medicine | Admitting: Family Medicine

## 2014-09-12 VITALS — BP 126/64 | HR 87 | Ht 69.0 in | Wt 216.0 lb

## 2014-09-12 VITALS — BP 112/62 | HR 83 | Temp 97.9°F | Ht 69.0 in | Wt 216.0 lb

## 2014-09-12 DIAGNOSIS — S72002A Fracture of unspecified part of neck of left femur, initial encounter for closed fracture: Secondary | ICD-10-CM

## 2014-09-12 DIAGNOSIS — R0600 Dyspnea, unspecified: Secondary | ICD-10-CM

## 2014-09-12 DIAGNOSIS — M25522 Pain in left elbow: Secondary | ICD-10-CM

## 2014-09-12 DIAGNOSIS — D509 Iron deficiency anemia, unspecified: Secondary | ICD-10-CM

## 2014-09-12 DIAGNOSIS — J432 Centrilobular emphysema: Secondary | ICD-10-CM | POA: Insufficient documentation

## 2014-09-12 DIAGNOSIS — N170 Acute kidney failure with tubular necrosis: Secondary | ICD-10-CM

## 2014-09-12 DIAGNOSIS — S52132A Displaced fracture of neck of left radius, initial encounter for closed fracture: Secondary | ICD-10-CM

## 2014-09-12 DIAGNOSIS — R29898 Other symptoms and signs involving the musculoskeletal system: Secondary | ICD-10-CM | POA: Insufficient documentation

## 2014-09-12 DIAGNOSIS — R0602 Shortness of breath: Secondary | ICD-10-CM

## 2014-09-12 DIAGNOSIS — K529 Noninfective gastroenteritis and colitis, unspecified: Secondary | ICD-10-CM

## 2014-09-12 DIAGNOSIS — S72142A Displaced intertrochanteric fracture of left femur, initial encounter for closed fracture: Secondary | ICD-10-CM

## 2014-09-12 MED ORDER — TRIAMCINOLONE ACETONIDE 0.1 % EX CREA: 1.0000 "application " | TOPICAL_CREAM | Freq: Two times a day (BID) | CUTANEOUS | Status: DC

## 2014-09-12 NOTE — Assessment & Plan Note (Signed)
I have been asked to evaluate [name] for shortness of breath. Though he smoked one and a half packs of cigarettes daily for 20 years he has never been told that he had a lung condition. Today on ambulation his oxygenation was normal, on physical exam his lung exam is normal, and a recent chest x-ray only showed some atelectasis and likely interstitial edema in the setting of renal failure. So at this point I do not see clear evidence of lung disease.  I'm greatly encouraged by the fact that his shortness of breath has improved significantly since he has lost much weight in the last month. His wife says that he is no longer snoring and he is getting around much better.  I suspect that his shortness of breath, which is largely resolved, was due to obesity and deconditioning.  Plan:  -we will complete the pulmonary workup with pulmonary function testing I suspect that will be normal -Continue aggressive physical therapy and attempts at rehabilitation -Followup as needed

## 2014-09-12 NOTE — Patient Instructions (Signed)
We will schedule a lung function test at [facility] If that test is abnormal we will see you again

## 2014-09-12 NOTE — Progress Notes (Signed)
Subjective:    Patient ID: [name], male    DOB: 11-11-1946, 69 y.o.   MRN: [id number]  HPI  [name] is here to see me because of shortness of breath.  He fell and broke his hip recently and when he was in the hospital he had worsening dyspnea.  He was short of breath for about 3-4 months prior to the hip surgery.  He used to weigh more than the does now and they always attributed his dyspnea to deconditioning and obesity.   In the last month he has lost 30-40 pounds with the last hospitalization and his breathing on exertion has imrpoved significnatly.  He no longer snores. Apparently during his hospitalization he had acute on chronic kidney failure and he was dialyzed twice during the hospitalization.  His kidney function recovered.  He has not followed up with nephrology since his hospitalization.  Apparently his kidney function has returned to baseline.   He says in the last week or two he has not had much dyspnea.  He has been walking with his son for exercise in the afternoons.  He has minimal dyspnea with this activity.   He smoked in years past, smoked 1.5 ppd for 20 years.   In 2005 he fell off a roof in 2005 and had a ruptured aorta requiring grafting.   Maintained on blood thinner postoperatively until he had a minor lower gi bleed at his skilled nursing facility and this was held. Though he has hip pain his leg swelling that was present when he had renal failure has completely resolved.  Past Medical History  Diagnosis Date  . CAD (coronary artery disease)   . MI (myocardial infarction) 1992    Acute anterior MI, thrombolytic therapy  . Diabetes mellitus     Type II  . Depression   . Hyperlipidemia     Statin with joint pain   . Hypertension   . Peripheral vascular disease     Atherosclerotic:R renal artery stenosis  . Fall 2005    fell off house: torn aorta, clavicle fracture, rib fracture, vertebral fractures, lung contusion, coma x 2 weeks  . Erectile dysfunction     . Aortic transection 2005    Traumatic after a fall from a second floor. s/p repair at [facility]  . Gallstone pancreatitis   . History of bleeding ulcers   . Basal cell carcinoma      Family History  Problem Relation Age of Onset  . Dementia Mother 45  . Heart attack Father 72  . Diabetes Brother 45  . Heart attack Brother   . Colon cancer Neg Hx   . Stomach cancer Neg Hx   . Esophageal cancer Neg Hx   . Rectal cancer Neg Hx      History   Social History  . Marital Status: Married    Spouse Name: N/A    Number of Children: N/A  . Years of Education: N/A   Occupational History  . 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 Smoking status: Former Smoker -- 1.50 packs/day for 20 years    Types: Cigarettes    Quit date: 09/13/1991  . Smokeless tobacco: Never Used     Comment: quit post MI  . Alcohol Use: Yes     Comment: occassionally  . Drug Use: No  . Sexual Activity: Not on file   Other Topics Concern  . Not on file   Social History Narrative  No regular exercise      Allergies  Allergen Reactions  . Lovenox [Enoxaparin Sodium]     Rash, blackness at injection site  . Dye Fdc Red [Red Dye] Rash  . Penicillins Rash     Outpatient Prescriptions Prior to Visit  Medication Sig Dispense Refill  . atorvastatin (LIPITOR) 40 MG tablet TAKE 1 TABLET EVERY DAY  30 tablet  1  . lisinopril (PRINIVIL,ZESTRIL) 40 MG tablet TAKE 1 TABLET EVERY DAY  30 tablet  1  . metFORMIN (GLUCOPHAGE-XR) 500 MG 24 hr tablet TAKE 1 TABLET EVERY DAY  30 tablet  1  . traMADol (ULTRAM) 50 MG tablet Take 1 tablet (50 mg total) by mouth every 6 (six) hours as needed.  50 tablet  2  . vardenafil (LEVITRA) 20 MG tablet Take 1 tablet (20 mg total) by mouth daily as needed for erectile dysfunction. 1 by mouth 30 min before intercourse  10 tablet  11  . citalopram (CELEXA) 20 MG tablet TAKE 1 TABLET EVERY DAY  30 tablet  2  . aspirin 81 MG tablet Take 81 mg by mouth daily.      [name] Kitchen glucose  blood test strip Use as instructed  100 each  12   No facility-administered medications prior to visit.      Review of Systems  Constitutional: Negative for fever and unexpected weight change.  HENT: Positive for sinus pressure. Negative for congestion, dental problem, ear pain, nosebleeds, postnasal drip, rhinorrhea, sneezing, sore throat and trouble swallowing.   Eyes: Negative for redness and itching.  Respiratory: Positive for shortness of breath. Negative for cough, chest tightness and wheezing.   Cardiovascular: Negative for palpitations and leg swelling.  Gastrointestinal: Negative for nausea and vomiting.  Genitourinary: Negative for dysuria.  Musculoskeletal: Negative for joint swelling.  Skin: Negative for rash.  Neurological: Negative for headaches.  Hematological: Does not bruise/bleed easily.  Psychiatric/Behavioral: Negative for dysphoric mood. The patient is not nervous/anxious.        Objective:   Physical Exam Filed Vitals:   09/12/14 1023  BP: 126/64  Pulse: 87  Height: 5\' 9"  (1.753 m)  Weight: 216 lb (97.977 kg)  SpO2: 96%  RA  Gen: Chronically ill appearing, no acute distress HEENT: NCAT, PERRL, EOMi, OP clear, neck supple without masses PULM: CTA B CV: RRR, no mgr, no JVD AB: BS+, soft, nontender, no hsm Ext: warm, no edema, no clubbing, no cyanosis Derm: no rash or skin breakdown Neuro: A&Ox4, CN II-XII intact, strength 5/5 upper extremities bilaterally and right lower extremity, he does have hip flexor weakness due to pain in the left leg  September 2015 chest x-ray reviewed> HD catheter in place, atelectasis and interstitial edema     Assessment & Plan:   Severe muscle deconditioning I have been asked to evaluate [name] for shortness of breath. Though he smoked one and a half packs of cigarettes daily for 20 years he has never been told that he had a lung condition. Today on ambulation his oxygenation was normal, on physical exam his lung exam  is normal, and a recent chest x-ray only showed some atelectasis and likely interstitial edema in the setting of renal failure. So at this point I do not see clear evidence of lung disease.  I'm greatly encouraged by the fact that his shortness of breath has improved significantly since he has lost much weight in the last month. His wife says that he is no longer snoring and he is getting  around much better.  I suspect that his shortness of breath, which is largely resolved, was due to obesity and deconditioning.  Plan:  -we will complete the pulmonary workup with pulmonary function testing I suspect that will be normal -Continue aggressive physical therapy and attempts at rehabilitation -Followup as needed  Dyspnea As above   Updated Medication List Outpatient Encounter Prescriptions as of 09/12/2014  Medication Sig  . amLODipine (NORVASC) 5 MG tablet Take 5 mg by mouth daily.  [name] Kitchen atorvastatin (LIPITOR) 40 MG tablet TAKE 1 TABLET EVERY DAY  . citalopram (CELEXA) 20 MG tablet .5 tablet daily  . Fluticasone-Salmeterol (ADVAIR) 250-50 MCG/DOSE AEPB Inhale 1 puff into the lungs 2 (two) times daily.  [name] Kitchen lisinopril (PRINIVIL,ZESTRIL) 40 MG tablet TAKE 1 TABLET EVERY DAY  . metFORMIN (GLUCOPHAGE-XR) 500 MG 24 hr tablet TAKE 1 TABLET EVERY DAY  . traMADol (ULTRAM) 50 MG tablet Take 1 tablet (50 mg total) by mouth every 6 (six) hours as needed.  . vardenafil (LEVITRA) 20 MG tablet Take 1 tablet (20 mg total) by mouth daily as needed for erectile dysfunction. 1 by mouth 30 min before intercourse  . [DISCONTINUED] citalopram (CELEXA) 20 MG tablet TAKE 1 TABLET EVERY DAY  . [DISCONTINUED] aspirin 81 MG tablet Take 81 mg by mouth daily.  . [DISCONTINUED] glucose blood test strip Use as instructed

## 2014-09-12 NOTE — Progress Notes (Signed)
Dr. Frederico Hamman T. Shaan Rhoads, MD, [facility] [place], [address] Phone: [phone] Fax: [phone]  09/12/2014  Patient: [name], MRN: [id number], DOB: [birth year], 68 y.o.  Primary Physician:  [name], MD  Chief Complaint: Hospitalization Follow-up  Subjective:   [name] is a 68 y.o. pleasant patient who presents with the following:  Complex hospitalization: [place]  Date of Admission: 08/09/2014 Date of Discharge: 08/15/2014  Consultants: Ortho: Dr. Marry Guan Nephrology: Dr. Candiss Norse and Dr. Juleen China Derm: Dr. Evorn Gong Vascular: Dr. Franchot Gallo for dialysis catheter placement  The patient fell while cutting a tree in his driveway and sustained a left hip fracture on the day of admission, and he at the same time went into acute renal failure due to ATN with Cr > 4 and hyperkalemia and oliguria in the hospital. He had an emergency dialysis catheter placed and required 2 days of dialysis in the hospital. Nephrology followed throughout and Cr was 1.28 at the time of discharge.   He was taken to the OR on 9/13 for ORIF of L hip fracture by Dr. Marry Guan.   The patient also complains to me of persistent L elbow pain since his fall. After d/c from inpatient therapy at [facility], his outpatient PT called me yesterday and asked to check his LEFT elbow. He has been ambulating with his walker and using L elbow with pain and some loss of supination and pronation.  He has had some post-operative anemia, Hgb 9.3 at time of discharge.  He also had colitis found on CT of abd / pelvis on admission and was placed on Cipro and Flagyl in the hospital.  Additionally, he also developed a rash, and dermatology was consulted. TAC ointment was used with improvement over time. Similar to a rash he had years ago.   Since d/c he has done well in [place] at rehab with some shortness of breath and left elbow pain ongoing. Creatinine continued to  improve, most recently 0.99. Hip rehab has continued to progress, and he is now outpatient PT and at home with supportive wife.    Patient Active Problem List   Diagnosis Date Noted  . Radial neck fracture 09/14/2014  . Fracture of femoral neck, left 09/14/2014  . Severe muscle deconditioning 09/12/2014  . Dyspnea 09/12/2014  . Aortic transection   . Peripheral vascular disease   . MI (myocardial infarction)   . Anemia associated with acute blood loss 04/30/2011  . Duodenal ulcer with hemorrhage 04/30/2011  . DIABETES MELLITUS, TYPE II 02/13/2010  . HYPERLIPIDEMIA 08/16/2008  . ERECTILE DYSFUNCTION 08/16/2008  . DEPRESSION 08/16/2008  . HYPERTENSION 08/16/2008  . CORONARY ARTERY DISEASE 08/16/2008   Past Medical History  Diagnosis Date  . CAD (coronary artery disease)   . MI (myocardial infarction) 1992    Acute anterior MI, thrombolytic therapy  . Diabetes mellitus     Type II  . Depression   . Hyperlipidemia     Statin with joint pain   . Hypertension   . Peripheral vascular disease     Atherosclerotic:R renal artery stenosis  . Fall 2005    fell off house: torn aorta, clavicle fracture, rib fracture, vertebral fractures, lung contusion, coma x 2 weeks  . Erectile dysfunction   . Aortic transection 2005    Traumatic after a fall from a second floor. s/p repair at [facility]  . Gallstone pancreatitis   . History of bleeding ulcers   . Basal  cell carcinoma   . Fracture of femoral neck, left 08/09/2014    ORIF, Dr. Marry Guan  . Fracture of radial neck, left, closed 08/09/2014  . History of ATN 08/09/2014    [facility], 2 days of dialysis (ARF)   Past Surgical History  Procedure Laterality Date  . Cholecystectomy    . Cardiovascular surgery      with ruptured Aorta, Dr. Camila Li, [facility]   . Knee surgery      Right   . Thoracotomy      thoracic aorta repair   . Tracheostomy      s/p reversal  . Cardiac catheterization  08/1991    50 % mid-Lad stenosis with clot, 25-505 second  marginal  . Orif hip fracture Left 08/12/2014    Dr. Marry Guan   History   Social History  . Marital Status: Married    Spouse Name: N/A    Number of Children: N/A  . Years of Education: N/A   Occupational History  . [place] in [place] History Main Topics  . Smoking status: Former Smoker -- 1.50 packs/day for 20 years    Types: Cigarettes    Quit date: 09/13/1991  . Smokeless tobacco: Never Used     Comment: quit post MI  . Alcohol Use: Yes     Comment: occassionally  . Drug Use: No  . Sexual Activity: Not on file   Other Topics Concern  . Not on file   Social History Narrative   No regular exercise    Family History  Problem Relation Age of Onset  . Dementia Mother 80  . Heart attack Father 35  . Diabetes Brother 66  . Heart attack Brother   . Colon cancer Neg Hx   . Stomach cancer Neg Hx   . Esophageal cancer Neg Hx   . Rectal cancer Neg Hx    Allergies  Allergen Reactions  . Lovenox [Enoxaparin Sodium]     Rash, blackness at injection site  . Dye Fdc Red [Red Dye] Rash  . Penicillins Rash   Medication list has been reviewed and updated.   GEN: complex hopsitalization, 30-40 pound weight loss GI: No n/v/d, Pulm: SOB improving Otherwise, the pertinent positives and negatives are listed above and in the HPI, otherwise a full review of systems has been reviewed and is negative unless noted positive.   Objective:   BP 112/62  Pulse 83  Temp(Src) 97.9 F (36.6 C) (Oral)  Ht 5\' 9"  (1.753 m)  Wt 216 lb (97.977 kg)  BMI 31.88 kg/m2   GEN: WDWN, NAD, Non-toxic, A & O x 3 HEENT: Atraumatic, Normocephalic. Neck supple. No masses, No LAD. Ears and Nose: No external deformity. CV: RRR, No M/G/R. No JVD. No thrill. No extra heart sounds. PULM: CTA B, no wheezes, crackles, rhonchi. No retractions. No resp. distress. No accessory muscle use. EXTR: No c/c/e NEURO using walker PSYCH: Normally interactive. Conversant. Not depressed or anxious  appearing.  Calm demeanor.   L elbow: NT along humerus, and ulna. Mild loss of ext and flexion, approx 20% loss of supination and pronation. Point of maximal tenderness that the patient points to is at the radial head.  Laboratory and Imaging Data:  Dg Elbow Complete Left  09/13/2014   CLINICAL DATA:  Left elbow pain after fall 6 weeks ago  EXAM: LEFT ELBOW - COMPLETE 3+ VIEW  COMPARISON:  None.  FINDINGS: There is a nondisplaced fracture of the left radial neck without  apparent articular surface involvement. There is no dislocation. There is no other fracture. There is a small joint effusion.  IMPRESSION: Nondisplaced fracture of the left radial neck without apparent articular surface involvement. No dislocation.   Electronically Signed   By: Kathreen Devoid   On: 09/13/2014 15:31    NOTE: image taken 09/12/2014 at time of office encounter, but our software had trouble transferring images to the radiologists' program until 09/13/2014.  The radiological images were independently reviewed by myself in the office and results were reviewed with the patient. My independent interpretation of images:  There is a non-displaced radial neck fracture on the left without extension into the joint. No dislocation. Appropriate near-anatomical position. Electronically Signed  By: [name], MD On: 09/12/2014  Results for orders placed in visit on 09/12/14  CBC WITH DIFFERENTIAL      Result Value Ref Range   WBC 7.0  4.0 - 10.5 K/uL   RBC 4.15 (*) 4.22 - 5.81 Mil/uL   Hemoglobin 12.5 (*) 13.0 - 17.0 g/dL   HCT 37.5 (*) 39.0 - 52.0 %   MCV 90.2  78.0 - 100.0 fl   MCHC 33.3  30.0 - 36.0 g/dL   RDW 14.4  11.5 - 15.5 %   Platelets 218.0  150.0 - 400.0 K/uL   Neutrophils Relative % 60.0  43.0 - 77.0 %   Lymphocytes Relative 25.9  12.0 - 46.0 %   Monocytes Relative 6.7  3.0 - 12.0 %   Eosinophils Relative 6.8 (*) 0.0 - 5.0 %   Basophils Relative 0.6  0.0 - 3.0 %   Neutro Abs 4.2  1.4 - 7.7 K/uL   Lymphs  Abs 1.8  0.7 - 4.0 K/uL   Monocytes Absolute 0.5  0.1 - 1.0 K/uL   Eosinophils Absolute 0.5  0.0 - 0.7 K/uL   Basophils Absolute 0.0  0.0 - 0.1 K/uL  BASIC METABOLIC PANEL      Result Value Ref Range   Sodium 138  135 - 145 mEq/L   Potassium 5.2 (*) 3.5 - 5.1 mEq/L   Chloride 104  96 - 112 mEq/L   CO2 25  19 - 32 mEq/L   Glucose, Bld 123 (*) 70 - 99 mg/dL   BUN 25 (*) 6 - 23 mg/dL   Creatinine, Ser 1.7 (*) 0.4 - 1.5 mg/dL   Calcium 9.8  8.4 - 10.5 mg/dL   GFR 42.90 (*) >60.00 mL/min     Assessment and Plan:   Acute renal failure with tubular necrosis - Plan: Basic metabolic panel: check BMP, increase in Cr to 1.7. I will call patient and need to repeat labs in about 1 week.  Intertrochanteric fracture of left hip, closed, initial encounter - Plan: Ambulatory referral to Orthopedic Surgery: Defer to Dr. Marry Guan, we confirmed follow-up which is in 2 weeks.  Left elbow pain - Plan: DG Elbow Complete Left: persistent elbow pain with DOI 08/09/2014, previously unknown non-displaced radial neck fracture. I tried to call Dr. Marry Guan myself, and he was not in the office. I spoke to his long-time PA, Vance Peper, about the case. He recommended that I place the patient in a posterior splint, have him use a platform walker, and they will assess at his follow-up in 2 weeks.   Anemia, iron deficiency - Plan: CBC with Differential. Hgb improving  Radial neck fracture, left, closed, initial encounter:  The patient was placed in a posterior splint on the LEFT without difficulty and given a sling.  Fracture of femoral neck, left, closed, initial encounter  Colitis: resolved.  >40 minutes spent in face to face time with patient, >50% spent in counselling or coordination of care: complex medical case. More than 60 pages of hospital documentation reviewed face to face with the patient and his wife in exam room going over the details of the story. Additionally, I called the managing orthopedic group on the  phone myself to discuss management and follow-up.   Follow-up: with ongoing renal question, may need earlier reassessment.  New Prescriptions   TRIAMCINOLONE CREAM (KENALOG) 0.1 %    Apply 1 application topically 2 (two) times daily.   Orders Placed This Encounter  Procedures  . DG Elbow Complete Left  . CBC with Differential  . Basic metabolic panel  . Ambulatory referral to Orthopedic Surgery    Signed,  Frederico Hamman T. Tresea Heine, MD   Patient's Medications  New Prescriptions   TRIAMCINOLONE CREAM (KENALOG) 0.1 %    Apply 1 application topically 2 (two) times daily.  Previous Medications   AMLODIPINE (NORVASC) 5 MG TABLET    Take 5 mg by mouth daily.   ATORVASTATIN (LIPITOR) 40 MG TABLET    TAKE 1 TABLET EVERY DAY   CITALOPRAM (CELEXA) 20 MG TABLET    .5 tablet daily   LISINOPRIL (PRINIVIL,ZESTRIL) 40 MG TABLET    TAKE 1 TABLET EVERY DAY   METFORMIN (GLUCOPHAGE-XR) 500 MG 24 HR TABLET    TAKE 1 TABLET EVERY DAY   TRAMADOL (ULTRAM) 50 MG TABLET    Take 1 tablet (50 mg total) by mouth every 6 (six) hours as needed.  Modified Medications   No medications on file  Discontinued Medications   FLUTICASONE-SALMETEROL (ADVAIR) 250-50 MCG/DOSE AEPB    Inhale 1 puff into the lungs 2 (two) times daily.   VARDENAFIL (LEVITRA) 20 MG TABLET    Take 1 tablet (20 mg total) by mouth daily as needed for erectile dysfunction. 1 by mouth 30 min before intercourse

## 2014-09-12 NOTE — Telephone Encounter (Signed)
i am happy to do this on his hospital follow-up

## 2014-09-12 NOTE — Progress Notes (Signed)
Pre visit review using our clinic review tool, if applicable. No additional management support is needed unless otherwise documented below in the visit note. 

## 2014-09-12 NOTE — Patient Instructions (Signed)

## 2014-09-12 NOTE — Assessment & Plan Note (Signed)
As above.

## 2014-09-12 NOTE — Telephone Encounter (Signed)
Per BQ, pt also needs an ono on his 30 in addition to his other recs reviewed at his ov today.  LMTCB X1 to make pt aware.  ono already ordered.

## 2014-09-12 NOTE — Telephone Encounter (Signed)
Pt returned call & will cb tomorrow.  Satira Anis

## 2014-09-13 LAB — BASIC METABOLIC PANEL WITH GFR
BUN: 25 mg/dL — ABNORMAL HIGH (ref 6–23)
CO2: 25 meq/L (ref 19–32)
Calcium: 9.8 mg/dL (ref 8.4–10.5)
Chloride: 104 meq/L (ref 96–112)
Creatinine, Ser: 1.7 mg/dL — ABNORMAL HIGH (ref 0.4–1.5)
GFR: 42.9 mL/min — ABNORMAL LOW
Glucose, Bld: 123 mg/dL — ABNORMAL HIGH (ref 70–99)
Potassium: 5.2 meq/L — ABNORMAL HIGH (ref 3.5–5.1)
Sodium: 138 meq/L (ref 135–145)

## 2014-09-13 LAB — CBC WITH DIFFERENTIAL/PLATELET
Basophils Absolute: 0 10*3/uL (ref 0.0–0.1)
Basophils Relative: 0.6 % (ref 0.0–3.0)
Eosinophils Absolute: 0.5 10*3/uL (ref 0.0–0.7)
Eosinophils Relative: 6.8 % — ABNORMAL HIGH (ref 0.0–5.0)
HCT: 37.5 % — ABNORMAL LOW (ref 39.0–52.0)
Hemoglobin: 12.5 g/dL — ABNORMAL LOW (ref 13.0–17.0)
Lymphocytes Relative: 25.9 % (ref 12.0–46.0)
Lymphs Abs: 1.8 10*3/uL (ref 0.7–4.0)
MCHC: 33.3 g/dL (ref 30.0–36.0)
MCV: 90.2 fl (ref 78.0–100.0)
Monocytes Absolute: 0.5 10*3/uL (ref 0.1–1.0)
Monocytes Relative: 6.7 % (ref 3.0–12.0)
Neutro Abs: 4.2 10*3/uL (ref 1.4–7.7)
Neutrophils Relative %: 60 % (ref 43.0–77.0)
Platelets: 218 10*3/uL (ref 150.0–400.0)
RBC: 4.15 Mil/uL — ABNORMAL LOW (ref 4.22–5.81)
RDW: 14.4 % (ref 11.5–15.5)
WBC: 7 10*3/uL (ref 4.0–10.5)

## 2014-09-13 NOTE — Telephone Encounter (Signed)
Yes of course

## 2014-09-13 NOTE — Telephone Encounter (Signed)
Please call [name] and let her know that [name] has a radial neck fracture. He is in a posterior splint now and will follow-up with Dr. Marry Guan.  Thanks so much for your input.

## 2014-09-13 NOTE — Telephone Encounter (Signed)
Caryl Pina, Physical Therapist at [facility] notified by telephone as instructed by Dr. Lorelei Pont.

## 2014-09-13 NOTE — Telephone Encounter (Signed)
Pt's spouse returned call Spoke with Mrs Jaquith, she reports that after pt left appt yesterday w/ BQ they discovered that pt's left arm was bothering him because it in fact is broken.  So pt now has a broken left arm AND hip.  Their son is trying to work with PT/OT to see if pt can receive his therapies all on one day since it is so difficult for him to be mobile.  Spouse is asking if there is any [place] that the ONO can be delayed for 2-3 weeks (this is the time-frame she was given for pt's arm).  Dr Lake Bells please advise, thank you.

## 2014-09-14 ENCOUNTER — Encounter: Payer: Self-pay | Admitting: Family Medicine

## 2014-09-14 DIAGNOSIS — S52133A Displaced fracture of neck of unspecified radius, initial encounter for closed fracture: Secondary | ICD-10-CM | POA: Insufficient documentation

## 2014-09-14 DIAGNOSIS — S72002A Fracture of unspecified part of neck of left femur, initial encounter for closed fracture: Secondary | ICD-10-CM | POA: Insufficient documentation

## 2014-09-17 ENCOUNTER — Ambulatory Visit: Payer: Self-pay | Admitting: Pulmonary Disease

## 2014-09-17 LAB — PULMONARY FUNCTION TEST

## 2014-09-26 ENCOUNTER — Other Ambulatory Visit: Payer: Self-pay | Admitting: Family Medicine

## 2014-09-26 ENCOUNTER — Ambulatory Visit (INDEPENDENT_AMBULATORY_CARE_PROVIDER_SITE_OTHER): Payer: Commercial Managed Care - HMO | Admitting: Family Medicine

## 2014-09-26 ENCOUNTER — Encounter: Payer: Self-pay | Admitting: Family Medicine

## 2014-09-26 ENCOUNTER — Ambulatory Visit (INDEPENDENT_AMBULATORY_CARE_PROVIDER_SITE_OTHER)
Admission: RE | Admit: 2014-09-26 | Discharge: 2014-09-26 | Disposition: A | Discharge status: Home / Self Care | Payer: Commercial Managed Care - HMO | Source: Ambulatory Visit | Attending: Family Medicine | Admitting: Family Medicine

## 2014-09-26 VITALS — BP 104/54 | HR 92 | Temp 98.1°F | Ht 69.0 in | Wt 216.5 lb

## 2014-09-26 DIAGNOSIS — I2510 Atherosclerotic heart disease of native coronary artery without angina pectoris: Secondary | ICD-10-CM

## 2014-09-26 DIAGNOSIS — S72002D Fracture of unspecified part of neck of left femur, subsequent encounter for closed fracture with routine healing: Secondary | ICD-10-CM

## 2014-09-26 DIAGNOSIS — R29898 Other symptoms and signs involving the musculoskeletal system: Secondary | ICD-10-CM

## 2014-09-26 DIAGNOSIS — I10 Essential (primary) hypertension: Secondary | ICD-10-CM

## 2014-09-26 DIAGNOSIS — S52131D Displaced fracture of neck of right radius, subsequent encounter for closed fracture with routine healing: Secondary | ICD-10-CM

## 2014-09-26 DIAGNOSIS — S52132D Displaced fracture of neck of left radius, subsequent encounter for closed fracture with routine healing: Secondary | ICD-10-CM

## 2014-09-26 DIAGNOSIS — E1159 Type 2 diabetes mellitus with other circulatory complications: Secondary | ICD-10-CM

## 2014-09-26 DIAGNOSIS — I739 Peripheral vascular disease, unspecified: Secondary | ICD-10-CM

## 2014-09-26 DIAGNOSIS — D62 Acute posthemorrhagic anemia: Secondary | ICD-10-CM

## 2014-09-26 DIAGNOSIS — R0600 Dyspnea, unspecified: Secondary | ICD-10-CM

## 2014-09-26 DIAGNOSIS — E785 Hyperlipidemia, unspecified: Secondary | ICD-10-CM

## 2014-09-26 DIAGNOSIS — I252 Old myocardial infarction: Secondary | ICD-10-CM

## 2014-09-26 DIAGNOSIS — E1151 Type 2 diabetes mellitus with diabetic peripheral angiopathy without gangrene: Secondary | ICD-10-CM

## 2014-09-26 LAB — BASIC METABOLIC PANEL WITH GFR
BUN: 19 mg/dL (ref 6–23)
CO2: 23 meq/L (ref 19–32)
Calcium: 9.7 mg/dL (ref 8.4–10.5)
Chloride: 104 meq/L (ref 96–112)
Creatinine, Ser: 1.2 mg/dL (ref 0.4–1.5)
GFR: 62.91 mL/min
Glucose, Bld: 97 mg/dL (ref 70–99)
Potassium: 5.5 meq/L — ABNORMAL HIGH (ref 3.5–5.1)
Sodium: 137 meq/L (ref 135–145)

## 2014-09-26 NOTE — Progress Notes (Signed)
Pre visit review using our clinic review tool, if applicable. No additional management support is needed unless otherwise documented below in the visit note. 

## 2014-09-26 NOTE — Progress Notes (Signed)
Dr. Frederico Hamman T. Lakia Gritton, MD, [facility] [place], [address] Phone: [phone] Fax: [phone]  09/26/2014  Patient: [name], MRN: [id number], DOB: [birth year], 68 y.o.  Primary Physician:  [name], MD  Chief Complaint: Follow-up  Subjective:   [name] is a 68 y.o. very pleasant male patient who presents with the following:  F/u OV from 2 weeks ago: the details of the patient's hospitalization or in that office note.  DOI 08/09/2014 Importantly, the patient is status post left-sided femoral neck fracture with ORIF treated by Dr. Marry Guan.  Last week, the patient and his wife were worried about his left sided  Elbow pain that had been ongoing since his initial fall.  At that time, we found that he had a nondisplaced radial neck fracture.  He was placed in a posterior splint and a sling without difficulty.  Repeat BMP. His creatinine also elevated again to 1.7.  In his BUNs and was slightly  Elevated.  This is as opposed to his most recent creatinine of 0.9.  BP high when on and at [facility] and laced on Norvasc.  Yesterday was at PT. Doing some PT yesterday. Then went to the next one and got dizzy. Sweating and pale at that time.   HR, Sugar. These were normal. BP was 112/60.  Baby coated aspirin.  D/c Norvasc.   F/u LEFT elbow fracture: radial neck. This is been feeling good and having somewhat less pain.   Past Medical History, Surgical History, Social History, Family History, Problem List, Medications, and Allergies have been reviewed and updated if relevant.   GEN: No acute illnesses, no fevers, chills. GI: No n/v/d, eating normally Pulm: No SOB Interactive and getting along well at home.  Otherwise, ROS is as per the HPI.  Objective:   BP 104/54  Pulse 92  Temp(Src) 98.1 F (36.7 C) (Oral)  Ht 5\' 9"  (1.753 m)  Wt 216 lb 8 oz (98.204 kg)  BMI 31.96 kg/m2  GEN: WDWN, NAD, Non-toxic,  A & O x 3 HEENT: Atraumatic, Normocephalic. Neck supple. No masses, No LAD. Ears and Nose: No external deformity. CV: RRR, No M/G/R. No JVD. No thrill. No extra heart sounds. PULM: CTA B, no wheezes, crackles, rhonchi. No retractions. No resp. distress. No accessory muscle use. EXTR: No c/c/e NEURO Normal gait.  PSYCH: Normally interactive. Conversant. Not depressed or anxious appearing.  Calm demeanor.   Left elbow: Decreased pain on palpation.  There is some stiffness, and he still lacks terminal extension as well as terminal pronation and supination.  Laboratory and Imaging Data: Results for orders placed in visit on 59/16/38  BASIC METABOLIC PANEL      Result Value Ref Range   Sodium 137  135 - 145 mEq/L   Potassium 5.5 (*) 3.5 - 5.1 mEq/L   Chloride 104  96 - 112 mEq/L   CO2 23  19 - 32 mEq/L   Glucose, Bld 97  70 - 99 mg/dL   BUN 19  6 - 23 mg/dL   Creatinine, Ser 1.2  0.4 - 1.5 mg/dL   Calcium 9.7  8.4 - 10.5 mg/dL   GFR 62.91  >60.00 mL/min    Dg Elbow Complete Left  09/26/2014   CLINICAL DATA:  Left radial neck fracture, subsequent encounter  EXAM: LEFT ELBOW - COMPLETE 3+ VIEW  COMPARISON:  09/12/2014  FINDINGS: Four views of left elbow submitted. Small joint effusion. Again  noted nondisplaced fracture of left radial neck with some healing response noted.  IMPRESSION: Abdomen noted nondisplaced fracture of the left radial neck with some healing response noted. Alignment is preserved.   Electronically Signed   By: Lahoma Crocker M.D.   On: 09/26/2014 15:22   Dg Elbow Complete Left  09/13/2014   CLINICAL DATA:  Left elbow pain after fall 6 weeks ago  EXAM: LEFT ELBOW - COMPLETE 3+ VIEW  COMPARISON:  None.  FINDINGS: There is a nondisplaced fracture of the left radial neck without apparent articular surface involvement. There is no dislocation. There is no other fracture. There is a small joint effusion.  IMPRESSION: Nondisplaced fracture of the left radial neck without apparent  articular surface involvement. No dislocation.   Electronically Signed   By: Kathreen Devoid   On: 09/13/2014 15:31    Assessment and Plan:   Radial neck fracture, right, closed, with routine healing, subsequent encounter - Plan: CANCELED: DG Elbow Complete Left  DM (diabetes mellitus) type II controlled peripheral vascular disorder - Plan: Basic metabolic panel  Coronary artery disease involving native coronary artery of native heart without angina pectoris  Anemia associated with acute blood loss  Dyspnea  Hyperlipidemia LDL goal <70  Essential hypertension  Fracture of femoral neck, left, closed, with routine healing, subsequent encounter  History of MI (myocardial infarction)  Peripheral vascular disease  Severe muscle deconditioning  With recent change from ICD-9 coding to ICD-10 coding, I took this time to go over the patient's problem list and clarify diagnoses with appropriate ICD-10 codes.  Elbow fracture is stable.  Evidence of good healing.  Discontinue posterior splint.  Discontinue sling.  Begin routine motion, flexion, extension, rotational movements.  Follow-up in 3 weeks to reassess. I had arranged for the patient to follow-up with the treating orthopedic surgeon in the case of his hip fracture, but the patient elected to not follow-up with him regarding his elbow.  And he returned to me today.  Stable, and should do well with conservative management.  I reviewed with him that he may not achieve baseline full range of motion.  If he is still having difficulty, we can have hand therapy get involved.  Blood pressure is slightly low.  Presyncopal episode.  Discontinue Norvasc.  Follow-up: Return in about 3 weeks (around 10/17/2014).  New Prescriptions   No medications on file   Orders Placed This Encounter  Procedures  . Basic metabolic panel    Signed,  Frederico Hamman T. Copper Basnett, MD   Patient's Medications  New Prescriptions   No medications on file  Previous  Medications   ATORVASTATIN (LIPITOR) 40 MG TABLET    TAKE 1 TABLET EVERY DAY   CITALOPRAM (CELEXA) 20 MG TABLET    .5 tablet daily   LISINOPRIL (PRINIVIL,ZESTRIL) 40 MG TABLET    TAKE 1 TABLET EVERY DAY   METFORMIN (GLUCOPHAGE-XR) 500 MG 24 HR TABLET    TAKE 1 TABLET EVERY DAY   TRAMADOL (ULTRAM) 50 MG TABLET    Take 1 tablet (50 mg total) by mouth every 6 (six) hours as needed.   TRIAMCINOLONE CREAM (KENALOG) 0.1 %    Apply 1 application topically 2 (two) times daily.  Modified Medications   No medications on file  Discontinued Medications   AMLODIPINE (NORVASC) 5 MG TABLET    Take 5 mg by mouth daily.

## 2014-09-30 ENCOUNTER — Encounter: Payer: Self-pay | Admitting: Internal Medicine

## 2014-10-02 ENCOUNTER — Telehealth: Payer: Self-pay | Admitting: *Deleted

## 2014-10-02 MED ORDER — LISINOPRIL 10 MG PO TABS: 10.0000 mg | ORAL_TABLET | Freq: Every day | ORAL | Status: DC

## 2014-10-02 MED ORDER — AMLODIPINE BESYLATE 5 MG PO TABS: 5.0000 mg | ORAL_TABLET | Freq: Every day | ORAL | Status: DC

## 2014-10-02 NOTE — Telephone Encounter (Signed)
Mr. Loewe notified as instructed by telephone.  Prescriptions sent to [facility].

## 2014-10-02 NOTE — Telephone Encounter (Signed)
-----   Message from [name], MD sent at 10/02/2014  6:01 PM EST ----- Call:  Kidney function back down to perfectly normal.  Potassium is a little high. I think we should lower his lisinopril dose - that probably is doing it.   D/c lisinopril 40 mg  Change to lisinopril 10 mg, 1 po daily. Add Norvasc 5 mg, 1 po daily  For both:  Electronically prescribe to the pharmacy of their choice. (May call in if pharmacy does not participate in electronic prescriptions) Call in #30, 11 refills. OR if they prefer a 90 day supply, #90 with 3 refills is OK, too  We can double check potassium level at his elbow follow-up

## 2014-10-17 ENCOUNTER — Other Ambulatory Visit: Payer: Self-pay | Admitting: Family Medicine

## 2014-10-17 ENCOUNTER — Ambulatory Visit (INDEPENDENT_AMBULATORY_CARE_PROVIDER_SITE_OTHER): Payer: Commercial Managed Care - HMO | Admitting: Family Medicine

## 2014-10-17 ENCOUNTER — Ambulatory Visit (INDEPENDENT_AMBULATORY_CARE_PROVIDER_SITE_OTHER)
Admission: RE | Admit: 2014-10-17 | Discharge: 2014-10-17 | Disposition: A | Discharge status: Home / Self Care | Payer: Commercial Managed Care - HMO | Source: Ambulatory Visit | Attending: Family Medicine | Admitting: Family Medicine

## 2014-10-17 ENCOUNTER — Encounter: Payer: Self-pay | Admitting: Family Medicine

## 2014-10-17 VITALS — BP 120/64 | HR 92 | Temp 98.4°F | Ht 69.0 in | Wt 212.5 lb

## 2014-10-17 DIAGNOSIS — S52132D Displaced fracture of neck of left radius, subsequent encounter for closed fracture with routine healing: Secondary | ICD-10-CM

## 2014-10-17 DIAGNOSIS — S72002D Fracture of unspecified part of neck of left femur, subsequent encounter for closed fracture with routine healing: Secondary | ICD-10-CM

## 2014-10-17 DIAGNOSIS — E785 Hyperlipidemia, unspecified: Secondary | ICD-10-CM

## 2014-10-17 NOTE — Telephone Encounter (Signed)
[name] has appointment today with Dr. Lorelei Pont.  Last lipid & A1c in chart 07/13/2013.  Ok to refill?

## 2014-10-17 NOTE — Progress Notes (Signed)
Pre visit review using our clinic review tool, if applicable. No additional management support is needed unless otherwise documented below in the visit note. 

## 2014-10-17 NOTE — Progress Notes (Signed)
Dr. Frederico Hamman T. Trachelle Low, MD, [facility] [place], [address] Phone: [phone] Fax: [phone]  10/17/2014  Patient: [name], MRN: [id number], DOB: [birth year], 68 y.o.  Primary Physician:  [name], MD  Chief Complaint: Follow-up  Subjective:   [name] is a 68 y.o. very pleasant male patient who presents with the following:  F/u radial neck fx right: He is now well over 2 months after his initial injury on August 09, 2014.  The patient fractured his radial neck at the same time when he fell and had his femoral neck fracture.  He is post femoral neck fracture fixation.  Initially, I picked up the patient's LEFT radial neck fracture on his hospital follow-up.  We placed him in a posterior splint for 2 weeks, then reassessed.  He is doing quite well and he has had excellent healing and excellent bony callus formation.  His pain is down dramatically.  Danice Goltz [facility] [phone]  10/17/2014 Last OV with [name], MD   F/u OV from 2 weeks ago: the details of the patient's hospitalization or in that office note.  DOI 08/09/2014 Importantly, the patient is status post left-sided femoral neck fracture with ORIF treated by Dr. Marry Guan.  Last week, the patient and his wife were worried about his left sided  Elbow pain that had been ongoing since his initial fall.  At that time, we found that he had a nondisplaced radial neck fracture.  He was placed in a posterior splint and a sling without difficulty.  Repeat BMP. His creatinine also elevated again to 1.7.  In his BUNs and was slightly  Elevated.  This is as opposed to his most recent creatinine of 0.9.  BP high when on and at [facility] and laced on Norvasc.  Yesterday was at PT. Doing some PT yesterday. Then went to the next one and got dizzy. Sweating and pale at that time.   HR, Sugar. These were normal. BP was 112/60.  Baby coated aspirin.  D/c  Norvasc.   F/u LEFT elbow fracture: radial neck. This is been feeling good and having somewhat less pain.   Past Medical History, Surgical History, Social History, Family History, Problem List, Medications, and Allergies have been reviewed and updated if relevant.   GEN: No acute illnesses, no fevers, chills. GI: No n/v/d, eating normally Pulm: No SOB Interactive and getting along well at home.  Otherwise, ROS is as per the HPI.  Objective:   BP 120/64 mmHg  Pulse 92  Temp(Src) 98.4 F (36.9 C) (Oral)  Ht 5\' 9"  (1.753 m)  Wt 212 lb 8 oz (96.389 kg)  BMI 31.37 kg/m2  GEN: WDWN, NAD, Non-toxic, A & O x 3 HEENT: Atraumatic, Normocephalic. Neck supple. No masses, No LAD. Ears and Nose: No external deformity. CV: RRR, No M/G/R. No JVD. No thrill. No extra heart sounds. PULM: CTA B, no wheezes, crackles, rhonchi. No retractions. No resp. distress. No accessory muscle use. EXTR: No c/c/e NEURO Normal gait.  PSYCH: Normally interactive. Conversant. Not depressed or anxious appearing.  Calm demeanor.   Left elbow: The patient now essentially has minimal to no pain on palpation.  He has full extension and full flexion as well as essentially full pronation and supination.  He has had some wasting of his biceps and triceps on the LEFT side.  Laboratory and Imaging Data: Results for orders placed or performed in visit on 09/26/14  Basic metabolic panel  Result Value Ref Range   Sodium 137 135 - 145 mEq/L   Potassium 5.5 (H) 3.5 - 5.1 mEq/L   Chloride 104 96 - 112 mEq/L   CO2 23 19 - 32 mEq/L   Glucose, Bld 97 70 - 99 mg/dL   BUN 19 6 - 23 mg/dL   Creatinine, Ser 1.2 0.4 - 1.5 mg/dL   Calcium 9.7 8.4 - 10.5 mg/dL   GFR 62.91 >60.00 mL/min    Dg Elbow Complete Left  10/17/2014   CLINICAL DATA:  Subsequent in count or for left radial head fracture  EXAM: LEFT ELBOW - COMPLETE 3+ VIEW  COMPARISON:  Previous study of September 26, 2014  FINDINGS: Again demonstrated is the impacted  slightly displaced radial head fracture. There is periosteal reaction. The fracture line remains visible but is less distinct today. A small posterior fat pad sign visible consistent with a small residual effusion. The distal humerus as well as the olecranon are intact.  IMPRESSION: There is ongoing healing of the impacted left radial head fracture but the fracture line remains visible. Continued follow-up imaging is recommended.   Electronically Signed   By: David  Martinique   On: 10/17/2014 16:21   Dg Elbow Complete Left  09/26/2014   CLINICAL DATA:  Left radial neck fracture, subsequent encounter  EXAM: LEFT ELBOW - COMPLETE 3+ VIEW  COMPARISON:  09/12/2014  FINDINGS: Four views of left elbow submitted. Small joint effusion. Again noted nondisplaced fracture of left radial neck with some healing response noted.  IMPRESSION: Abdomen noted nondisplaced fracture of the left radial neck with some healing response noted. Alignment is preserved.   Electronically Signed   By: Lahoma Crocker M.D.   On: 09/26/2014 15:22     Assessment and Plan:   Fracture of radial neck, left, closed, with routine healing, subsequent encounter  Hyperlipidemia LDL goal <70  Fracture of femoral neck, left, closed, with routine healing, subsequent encounter - Plan: DG Elbow Complete Left  The patient has done dramatically well.  There is excellent bony callus formation and excellent signs of healing.  Given his delayed immobilization, he may have somewhat delayed healing.  Clinically, he is doing well and his motion is as good as it possibly could be.  His activity is limited now from his hip fracture standpoint also.  Very well-educated patient, he is going to call me if he has any change at all in his symptoms. I think he can f/u prn and cont PT.  Follow-up: 6 mo  New Prescriptions   No medications on file   Orders Placed This Encounter  Procedures  . DG Elbow Complete Left    Signed,  Heron Pitcock T. Bently Wyss,  MD   Patient's Medications  New Prescriptions   No medications on file  Previous Medications   AMLODIPINE (NORVASC) 5 MG TABLET    Take 1 tablet (5 mg total) by mouth daily.   CITALOPRAM (CELEXA) 20 MG TABLET    .5 tablet daily   LISINOPRIL (PRINIVIL,ZESTRIL) 10 MG TABLET    Take 1 tablet (10 mg total) by mouth daily.   TRAMADOL (ULTRAM) 50 MG TABLET    Take 1 tablet (50 mg total) by mouth every 6 (six) hours as needed.   TRIAMCINOLONE CREAM (KENALOG) 0.1 %    Apply 1 application topically 2 (two) times daily.  Modified Medications   Modified Medication Previous Medication   ATORVASTATIN (LIPITOR) 40 MG TABLET atorvastatin (LIPITOR) 40 MG tablet  TAKE 1 TABLET EVERY DAY    TAKE 1 TABLET EVERY DAY   METFORMIN (GLUCOPHAGE-XR) 500 MG 24 HR TABLET metFORMIN (GLUCOPHAGE-XR) 500 MG 24 hr tablet      TAKE 1 TABLET EVERY DAY    TAKE 1 TABLET EVERY DAY  Discontinued Medications   No medications on file

## 2014-10-22 ENCOUNTER — Telehealth: Payer: Self-pay

## 2014-10-22 NOTE — Telephone Encounter (Signed)
Pt aware of results and recs.  Scheduled for 12/04/14 in BT. Nothing further needed.

## 2014-10-22 NOTE — Telephone Encounter (Signed)
-----   Message from Juanito Doom, MD sent at 10/22/2014  3:07 AM EST ----- A, Please let him know that his PFT showed mild COPD which does not need to be treated unless he is experiencing shortness of breath.  We can talk more about this on the next visit. Thanks B

## 2014-10-30 ENCOUNTER — Encounter: Payer: Self-pay | Admitting: Internal Medicine

## 2014-11-30 ENCOUNTER — Encounter: Payer: Self-pay | Admitting: Internal Medicine

## 2014-12-04 ENCOUNTER — Ambulatory Visit: Payer: Commercial Managed Care - HMO | Admitting: Pulmonary Disease

## 2015-02-02 ENCOUNTER — Emergency Department: Payer: Self-pay | Admitting: Emergency Medicine

## 2015-02-11 ENCOUNTER — Encounter: Payer: Self-pay | Admitting: Family Medicine

## 2015-02-11 ENCOUNTER — Ambulatory Visit (INDEPENDENT_AMBULATORY_CARE_PROVIDER_SITE_OTHER): Payer: PPO | Admitting: Family Medicine

## 2015-02-11 VITALS — BP 120/70 | HR 68 | Temp 98.3°F | Ht 69.0 in | Wt 222.8 lb

## 2015-02-11 DIAGNOSIS — T18128A Food in esophagus causing other injury, initial encounter: Secondary | ICD-10-CM

## 2015-02-11 DIAGNOSIS — K222 Esophageal obstruction: Secondary | ICD-10-CM

## 2015-02-11 DIAGNOSIS — R0789 Other chest pain: Secondary | ICD-10-CM

## 2015-02-11 NOTE — Progress Notes (Signed)
   Dr. Frederico Hamman T. Shon Mansouri, MD, [facility] [place], [address] Phone: [phone] Fax: [phone]  02/11/2015  Patient: [name], MRN: [id number], DOB: 07-19-1946, 69 y.o.  Primary Physician:  [name], MD  Chief Complaint: Hospitalization Follow-up  Subjective:   [name] is a 69 y.o. very pleasant male patient who presents with the following:  02/03/2015 ER visit at [facility] with chest pain from getting piece of steak caught in his chest.  All [facility] records reviewed.  Normal EKG, neg troponin and normal BNP.  Piece of steak felt like it would not go down and was hurting in his chest. Was throwing up some liquid.  No recent dyspnea or chest pain.  Reviewed 2013 stress myoview and echo. H/o MI > 10 years ago   Past Medical History, Surgical History, Social History, Family History, Problem List, Medications, and Allergies have been reviewed and updated if relevant.   GEN: No acute illnesses, no fevers, chills. GI: as above Pulm: No SOB Interactive and getting along well at home.  Otherwise, ROS is as per the HPI.  Objective:   BP 120/70 mmHg  Pulse 68  Temp(Src) 98.3 F (36.8 C) (Oral)  Ht 5\' 9"  (1.753 m)  Wt 222 lb 12 oz (101.039 kg)  BMI 32.88 kg/m2  GEN: WDWN, NAD, Non-toxic, A & O x 3 HEENT: Atraumatic, Normocephalic. Neck supple. No masses, No LAD. Ears and Nose: No external deformity. CV: RRR, No M/G/R. No JVD. No thrill. No extra heart sounds. PULM: CTA B, no wheezes, crackles, rhonchi. No retractions. No resp. distress. No accessory muscle use. EXTR: No c/c/e NEURO Normal gait.  PSYCH: Normally interactive. Conversant. Not depressed or anxious appearing.  Calm demeanor.   Laboratory and Imaging Data: Scanned [facility] notes  Assessment and Plan:   Obstruction of esophagus due to food impaction  Other chest pain  Almost certainly non-cardiac and from steak caught in throat, resolved.   If recurrence happens, then call and EGD good idea.   Signed,  Maud Deed. Chastelyn Athens, MD   Patient's Medications  New Prescriptions   No medications on file  Previous Medications   AMLODIPINE (NORVASC) 5 MG TABLET    Take 1 tablet (5 mg total) by mouth daily.   ASPIRIN 81 MG TABLET    Take 81 mg by mouth daily.   ATORVASTATIN (LIPITOR) 40 MG TABLET    TAKE 1 TABLET EVERY DAY   CITALOPRAM (CELEXA) 20 MG TABLET    .5 tablet daily   LISINOPRIL (PRINIVIL,ZESTRIL) 10 MG TABLET    Take 1 tablet (10 mg total) by mouth daily.   METFORMIN (GLUCOPHAGE-XR) 500 MG 24 HR TABLET    TAKE 1 TABLET EVERY DAY  Modified Medications   No medications on file  Discontinued Medications   TRAMADOL (ULTRAM) 50 MG TABLET    Take 1 tablet (50 mg total) by mouth every 6 (six) hours as needed.   TRIAMCINOLONE CREAM (KENALOG) 0.1 %    Apply 1 application topically 2 (two) times daily.

## 2015-02-11 NOTE — Progress Notes (Signed)
Pre visit review using our clinic review tool, if applicable. No additional management support is needed unless otherwise documented below in the visit note. 

## 2015-02-18 ENCOUNTER — Other Ambulatory Visit: Payer: Self-pay | Admitting: Family Medicine

## 2015-03-15 ENCOUNTER — Encounter: Payer: Self-pay | Admitting: Pulmonary Disease

## 2015-03-23 NOTE — Consult Note (Signed)
General Aspect Hyperkalemia with acute renal failure   Present Illness The patient is a 69 year old male patient with hypertension, diabetes, who fell while he was cutting down the limbs of a tree yesterday. He fell down on the concrete part of his driveway and suffered hip pain and hip fracture.  The patient denies any chest pain or trouble breathing. He claims to be in good health and denies any other complaints. The patient has left femur fracture on the radiographs.  On further work up he is found to have severe hyperkalemia and acute renal failure.  He therefore will require treatment of these abnormalities before he can undergo surgery     PAST MEDICAL HISTORY:  Significant for hypertension and diabetes.  History of coronary artery disease, MI in 1992 and the patient had some clot busters, but he did not have any stent.  MEDICATIONS: Aspirin 81 mg daily, atorvastatin 40 mg daily, Celexa 20 mg daily, lisinopril 40 mg p.o. daily, metformin XR 500 mg p.o. daily.    Penicillin: Rash  Lovenox: Bleeding  Morphine: Hives, Rash  Case History:  Family History Non-Contributory   Social History negative tobacco, negative ETOH, negative Illicit drugs   Review of Systems:  Fever/Chills No   Cough No   Sputum No   Abdominal Pain No   Diarrhea No   Constipation No   Nausea/Vomiting No   SOB/DOE No   Chest Pain No   Telemetry Reviewed NSR   Dysuria No   Tolerating PT No   Tolerating Diet Yes   Physical Exam:  GEN well developed, well nourished, no acute distress   HEENT hearing intact to voice, moist oral mucosa   NECK supple  trachea midline   RESP normal resp effort  postive use of accessory muscles   CARD regular rate  no JVD   ABD denies tenderness  soft   EXTR palpable DP pulses left leg in traction   SKIN normal to palpation, No rashes   NEURO cranial nerves intact, follows commands   PSYCH alert, A+O to time, place, person, good insight    Nursing/Ancillary Notes: **Vital Signs.:   11-Sep-15 05:07  Vital Signs Type Routine  Temperature Temperature (F) 98.3  Celsius 36.8  Temperature Source oral  Pulse Pulse 73  Respirations Respirations 18  Systolic BP Systolic BP 94  Diastolic BP (mmHg) Diastolic BP (mmHg) 60  Mean BP 71  Pulse Ox % Pulse Ox % 95  Pulse Ox Activity Level  At rest  Oxygen Delivery Room Air/ 21 %   Routine Chem:  11-Sep-15 05:16   Glucose, Serum  154  BUN  32  Creatinine (comp)  2.78  Sodium, Serum 138  Potassium, Serum  6.0  Chloride, Serum 107  CO2, Serum 23  Calcium (Total), Serum  8.4  Anion Gap 8  Osmolality (calc) 286  eGFR (African American)  26  eGFR (Non-African American)  23 (eGFR values <77m/min/1.73 m2 may be an indication of chronic kidney disease (CKD). Calculated eGFR is useful in patients with stable renal function. The eGFR calculation will not be reliable in acutely ill patients when serum creatinine is changing rapidly. It is not useful in  patients on dialysis. The eGFR calculation may not be applicable to patients at the low and high extremes of body sizes, pregnant women, and vegetarians.)  Routine Hem:  11-Sep-15 05:16   WBC (CBC) 10.5  RBC (CBC) 4.59  Hemoglobin (CBC) 13.5  Hematocrit (CBC) 41.5  Platelet Count (CBC) 180  MCV 91  MCH 29.5  MCHC 32.6  RDW 13.7  Neutrophil % 76.4  Lymphocyte % 12.4  Monocyte % 10.1  Eosinophil % 0.6  Basophil % 0.5  Neutrophil #  8.1  Lymphocyte # 1.3  Monocyte #  1.1  Eosinophil # 0.1  Basophil # 0.1 (Result(s) reported on 10 Aug 2014 at [facility].)   XRay:    10-Sep-15 16:25, Hip Left Complete  Hip Left Complete   REASON FOR EXAM:    fall  COMMENTS:       PROCEDURE: DXR - DXR HIP LEFT COMPLETE  - Aug 09 2014  4:25PM     CLINICAL DATA:  Fall.  Left hip pain.    EXAM:  LEFT HIP - COMPLETE 2+ VIEW    COMPARISON:  CT of the abdomen and pelvis 04/22/2011    FINDINGS:  A comminuted intratrochanteric fracture  is present in the proximal  left femur without significant displacement. The visualized pelvis  is intact.     IMPRESSION:  1. Comminuted intratrochanteric fracture of the proximal left femur.      Electronically Signed    By: Lawrence Santiago M.D.    On: 08/09/2014 16:30         Verified By: Resa Miner. MATTERN, M.D.,    Impression 1.  Hyperkalemia with abnormal renal function.  He will need dialysis now to fix his K+ temporary cath will be placed.  He will be followed to see if he need permanent dialysis as we have no prior knowledge fo renal disease and he may recover. 2.  Hypertension. kidney function is abnormal BP adequate renal function abnormal hold lisinopril for now 3.  Diabetes mellitus type 2. The patient is on metformin. Kidney functionis abnormal and he will requir surgery  treat with sliding scale  4.  History of depression. Continue Celexa 20 mg daily.  5.  Hyperlipidemia. Continue atorvastatin and aspirin.  6. Gastrointestinal prophylaxis with proton pump inhibitors and deep vein thrombosis prophylaxis with Lovenox.   Plan level 3 consult   Electronic Signatures: Hortencia Pilar (MD)  (Signed 27-Sep-15 12:17)  Authored: General Aspect/Present Illness, Allergies, History and Physical Exam, Vital Signs, Labs, Radiology, Impression/Plan   Last Updated: 27-Sep-15 12:17 by Hortencia Pilar (MD)

## 2015-03-23 NOTE — Consult Note (Signed)
Brief Consult Note: Diagnosis: Left intertrochanteric femur fracture; contusion left elbow.   Patient was seen by consultant.   Comments: Recommend ORIF of left intertrochanteric femur fracture  The risks and benefits of surgical intervention were discussed in detail with the patient and his wife. They expressed understanding of the risks and benefits and agreed with plans for surgery.  Surgical site signed as per "right site surgery" protocol.  Electronic Signatures: Dereck Leep (MD)  (Signed 10-Sep-15 20:17)  Authored: Brief Consult Note   Last Updated: 10-Sep-15 20:17 by Dereck Leep (MD)

## 2015-03-23 NOTE — Op Note (Signed)
PATIENT NAME:  [name], [name] MR#:  [id number] DATE OF BIRTH:  March 14, 1946  DATE OF PROCEDURE:  08/10/2014  PREOPERATIVE DIAGNOSES:  1. Acute renal insufficiency.  2. Left hip fracture.   POSTOPERATIVE DIAGNOSES:  1. Acute renal insufficiency.  2. Left hip fracture.   PROCEDURE PERFORMED: Insertion of right IJ Trialysis temporary catheter.   SURGEON: Hortencia Pilar, MD.   DESCRIPTION OF PROCEDURE: The patient is in the intensive care unit. He is positioned supine and his right neck is prepped and draped in a sterile fashion. Ultrasound is placed in a sterile sleeve. Appropriate timeout is called.   Neck is then evaluated with the ultrasound. Jugular vein is identified. It is echolucent and compressible indicating patency, images recorded for the permanent record. Under real-time visualization a micropuncture needle is inserted into the jugular vein and micro wire is advanced without difficulty. This is after 1% lidocaine has been infiltrated in the soft tissues. Micro sheath is then inserted, J-wire, counterincision is made with an 11 blade scalpel. Dilator is passed over the J-wire and subsequently the Trialysis catheter is advanced without difficulty. All 3 lumens aspirate easily and flush well. The catheter is secured to the skin with 2-0 nylon and a sterile dressing including a Biopatch is applied. The patient tolerated the procedure well and there were no immediate complications.     ____________________________ Katha Cabal, MD ggs:bu D: 08/10/2014 21:16:14 ET T: 08/10/2014 21:22:07 ET JOB#: [id number]  cc: Katha Cabal, MD, <Dictator> Katha Cabal MD ELECTRONICALLY SIGNED 09/17/2014 20:42

## 2015-03-23 NOTE — Consult Note (Signed)
General Aspect acute on chronic renal failure left hip fracture   Present Illness The patient is a 69 year old male patient with hypertension, diabetes, had a fall while he was cutting down the limbs of a tree earlier this day. He fell on the driveway and suffered hip pain and hip fracture.  The patient denies any chest pain or trouble breathing. Recently, he has been in good health and denies any other complaints specifically he denies any known renal problems.  The patient has left femur fracture on the radiographs.  He is found to have renal disease associated with hyperkalemia and will requir HD urgently  PAST MEDICAL HISTORY:   1.  hypertension 2.  diabetes. 3.  coronary artery disease, MI in 1992 4.  Peptic ulcer disease 5.  depression   Home Medications: Medication Instructions Status  citalopram 20 mg oral tablet 1  orally once a day  Active  metformin 500 mg oral tablet, extended release 1  orally once a day  Active  Aspir 81 81 mg oral tablet 1 tab(s) orally once a day Active  lisinopril 40 mg oral tablet 1 tab(s) orally once a day Active  atorvastatin 40 mg oral tablet 1 tab(s) orally once a day (at bedtime) Active    Penicillin: Rash  Lovenox: Bleeding  Morphine: Hives, Rash  Case History:  Family History Non-Contributory   Social History positive tobacco (Greater than 1 year), positive ETOH, negative Illicit drugs   Review of Systems:  Fever/Chills No   Cough No   Sputum No   Abdominal Pain No   Diarrhea No   Constipation No   Nausea/Vomiting No   SOB/DOE No   Chest Pain No   Telemetry Reviewed NSR   Dysuria No   Physical Exam:  GEN well developed, well nourished   HEENT hearing intact to voice, moist oral mucosa   NECK supple  trachea midline   RESP normal resp effort  no use of accessory muscles   CARD regular rate  no JVD   ABD denies tenderness  soft   EXTR left leg in traction, very painful with movement   SKIN No rashes, No  ulcers, skin turgor good   NEURO cranial nerves intact, follows commands   PSYCH alert, A+O to time, place, person, good insight   Nursing/Ancillary Notes: **Vital Signs.:   11-Sep-15 13:35  Vital Signs Type Q 4hr  Temperature Temperature (F) 97.5  Celsius 36.3  Temperature Source oral  Pulse Pulse 65  Respirations Respirations 18  Systolic BP Systolic BP 195  Diastolic BP (mmHg) Diastolic BP (mmHg) 63  Mean BP 76  Pulse Ox % Pulse Ox % 95  Pulse Ox Activity Level  At rest  Oxygen Delivery Room Air/ 21 %   Hepatic:  11-Sep-15 12:11   Albumin, Serum 4.1  Bilirubin, Total 0.7  Bilirubin, Direct 0.1 (Result(s) reported on 10 Aug 2014 at 02:58PM.)  Alkaline Phosphatase 71 (46-116 NOTE: New Reference Range 06/19/14)  SGPT (ALT) 32 (14-63 NOTE: New Reference Range 06/19/14)  SGOT (AST) 18  Total Protein, Serum 6.9    21:28   Albumin, Serum 3.7  Routine Chem:  11-Sep-15 05:16   Glucose, Serum  154  BUN  32  Creatinine (comp)  2.78  Sodium, Serum 138  Potassium, Serum  6.0  Chloride, Serum 107  CO2, Serum 23  Calcium (Total), Serum  8.4  Anion Gap 8  Osmolality (calc) 286  eGFR (African American)  26  eGFR (Non-African American)  23 (eGFR values <22m/min/1.73 m2 may be an indication of chronic kidney disease (CKD). Calculated eGFR is useful in patients with stable renal function. The eGFR calculation will not be reliable in acutely ill patients when serum creatinine is changing rapidly. It is not useful in  patients on dialysis. The eGFR calculation may not be applicable to patients at the low and high extremes of body sizes, pregnant women, and vegetarians.)    12:11   Glucose, Serum  141  BUN  35  Creatinine (comp)  3.55  Sodium, Serum 141  Potassium, Serum  6.1  Chloride, Serum  112  CO2, Serum 22  Calcium (Total), Serum  7.9  Anion Gap 7  Osmolality (calc) 292  eGFR (African American)  19  eGFR (Non-African American)  17 (eGFR values <651mmin/1.73  m2 may be an indication of chronic kidney disease (CKD). Calculated eGFR is useful in patients with stable renal function. The eGFR calculation will not be reliable in acutely ill patients when serum creatinine is changing rapidly. It is not useful in  patients on dialysis. The eGFR calculation may not be applicable to patients at the low and high extremes of body sizes, pregnant women, and vegetarians.)    18:21   Glucose, Serum  121  BUN  31  Creatinine (comp)  4.11  Sodium, Serum 140  Potassium, Serum  5.6  Chloride, Serum  110  CO2, Serum 22  Calcium (Total), Serum  8.2  Anion Gap 8  Osmolality (calc) 287  eGFR (African American)  16  eGFR (Non-African American)  14 (eGFR values <6019min/1.73 m2 may be an indication of chronic kidney disease (CKD). Calculated eGFR is useful in patients with stable renal function. The eGFR calculation will not be reliable in acutely ill patients when serum creatinine is changing rapidly. It is not useful in  patients on dialysis. The eGFR calculation may not be applicable to patients at the low and high extremes of body sizes, pregnant women, and vegetarians.)    21:28   Glucose, Serum  120  BUN  38  Creatinine (comp)  3.56  Sodium, Serum 140  Potassium, Serum 5.0  Chloride, Serum  111  CO2, Serum  20  Calcium (Total), Serum  8.0  Anion Gap 9  Osmolality (calc) 290  eGFR (African American)  19  eGFR (Non-African American)  17 (eGFR values <51m40mn/1.73 m2 may be an indication of chronic kidney disease (CKD). Calculated eGFR is useful in patients with stable renal function. The eGFR calculation will not be reliable in acutely ill patients when serum creatinine is changing rapidly. It is not useful in  patients on dialysis. The eGFR calculation may not be applicable to patients at the low and high extremes of body sizes, pregnant women, and vegetarians.)  Cholesterol, Serum 120  Triglycerides, Serum 164  HDL (INHOUSE)  29  VLDL  Cholesterol Calculated 33  LDL Cholesterol Calculated 58 (Result(s) reported on 10 Aug 2014 at 10:59PM.)  Phosphorus, Serum  5.4  Cardiac:  11-Sep-15 12:11   CK, Total 255 (39-308 NOTE: NEW REFERENCE RANGE  01/01/2014)  Routine UA:  11-Sep-15 02:07   Color (UA) Yellow  Clarity (UA) Clear  Glucose (UA) Negative  Bilirubin (UA) Negative  Ketones (UA) Negative  Specific Gravity (UA) 1.014  Blood (UA) Negative  pH (UA) 5.0  Protein (UA) Negative  Nitrite (UA) Negative  Leukocyte Esterase (UA) Negative (Result(s) reported on 10 Aug 2014 at 05:56AM.)  RBC (UA) 1 /HPF  WBC (UA) <1 /HPF  Bacteria (UA) NONE SEEN  Epithelial Cells (UA) NONE SEEN  Mucous (UA) PRESENT  Hyaline Cast (UA) 1 /LPF (Result(s) reported on 10 Aug 2014 at 05:56AM.)  Routine Hem:  11-Sep-15 05:16   WBC (CBC) 10.5  RBC (CBC) 4.59  Hemoglobin (CBC) 13.5  Hematocrit (CBC) 41.5  Platelet Count (CBC) 180  MCV 91  MCH 29.5  MCHC 32.6  RDW 13.7  Neutrophil % 76.4  Lymphocyte % 12.4  Monocyte % 10.1  Eosinophil % 0.6  Basophil % 0.5  Neutrophil #  8.1  Lymphocyte # 1.3  Monocyte #  1.1  Eosinophil # 0.1  Basophil # 0.1 (Result(s) reported on 10 Aug 2014 at 06:07AM.)   XRay:    10-Sep-15 16:25, Hip Left Complete  Hip Left Complete   REASON FOR EXAM:    fall  COMMENTS:       PROCEDURE: DXR - DXR HIP LEFT COMPLETE  - Aug 09 2014  4:25PM     CLINICAL DATA:  Fall.  Left hip pain.    EXAM:  LEFT HIP - COMPLETE 2+ VIEW    COMPARISON:  CT of the abdomen and pelvis 04/22/2011    FINDINGS:  A comminuted intratrochanteric fracture is present in the proximal  left femur without significant displacement. The visualized pelvis  is intact.     IMPRESSION:  1. Comminuted intratrochanteric fracture of the proximal left femur.      Electronically Signed    By: Lawrence Santiago M.D.    On: 08/09/2014 16:30         Verified By: Resa Miner. MATTERN, M.D.,    Impression 1.  Acute on chronic renal  disease new finding: nephrology on consult, given the elevated K+ and the need for hip repair I will place a temporary dialysis catheter.  further assessment of his need for dialysis access will be made as he progresses 2.  Hyperkalemia  will need acute dialysis 3.  Left hip fracture  plan per Dr Marry Guan surgery on hold whil issues 1&2 are addressed  4.  History of depression. Continue Celexa 20 mg daily.  5.  Hyperlipidemia. Continue atorvastatin and aspirin. 6.  Hypertension. The patient's blood pressure is controlled, so he can continue his lisinopril.  7.  Peptic ulcer disease  initialte PPI therapy   Plan level 4 consult   Electronic Signatures: Hortencia Pilar (MD)  (Signed 12-Sep-15 15:55)  Authored: General Aspect/Present Illness, Home Medications, Allergies, History and Physical Exam, Vital Signs, Labs, Radiology, Impression/Plan   Last Updated: 12-Sep-15 15:55 by Hortencia Pilar (MD)

## 2015-03-23 NOTE — H&P (Signed)
PATIENT NAME:  [name], [name] MR#:  [id number] DATE OF BIRTH:  01-Jan-1946  DATE OF ADMISSION:  08/09/2014   PRIMARY CARE PHYSICIAN:  Dr. Edilia Bo.  EMERGENCY ROOM PHYSICIAN:  Loura Pardon.     CHIEF COMPLAINT:  Left hip pain.   HISTORY OF PRESENT ILLNESS: A 69 year old male patient with hypertension, diabetes, had a fall while he was cutting down the limbs of a tree this afternoon. He fell down on the concrete part of his driveway and suffered hip pain and hip fracture.  The patient denies any chest pain or trouble breathing. Recently, he has been in good health and denies any other complaints. The patient has left femur fracture on the radiographs and we are admitting the patient. Dr. Marry Guan will be consulted.     PAST MEDICAL HISTORY:  Significant for hypertension and diabetes.  History of coronary artery disease, MI in 1992 and the patient had some clot busters, but he did not have any stent.    ALLERGIES:   LOVENOX, MORPHINE, PENICILLIN.  SOCIAL HISTORY:  No smoking. No drinking. Lives with wife.   PAST SURGICAL HISTORY:peptic ulcer surgery  MEDICATIONS: Aspirin 81 mg daily, atorvastatin 40 mg daily, Celexa 20 mg daily, lisinopril 40 mg p.o. daily, metformin XR 500 mg p.o. daily.   FAMILY HISTORY: No hypertension or diabetes.   REVIEW OF SYSTEMS: CONSTITUTIONAL: Has no fever. No fatigue.  EYES: No blurred vision.  ENT: No tinnitus. No ear pain. No epistaxis. No difficulty swallowing.  GENITOURINARY:   No dysuria.  ENDOCRINE: No polyuria or polydipsia.  HEMATOLOGIC:  No anemia. INTEGUMENTARY:  No skin rashes. MUSCULOSKELETAL: Complains of left hip pain.  NEUROLOGIC: No numbness or weakness.  PSYCHIATRIC: No anxiety or insomnia.   PHYSICAL EXAMINATION: VITAL SIGNS: Temperature 97.7, heart rate 63, blood pressure 140/68, sats 97% on room air.  GENERAL: Alert, awake, oriented. A 69 year old male not in distress, answering questions appropriately.  HEAD: Normocephalic, atraumatic.   EYES: Pupils equal, reacting to light. No conjunctival pallor. No scleral icterus.  NOSE: No nasal lesions.  No drainage.  MOUTH: No lesions. No exudates.  NECK: Supple. No JVD. No carotid bruit. Normal range of motion. RESPIRATORY:  Good respiratory effort.  Clear to auscultation. No wheeze. No rales.  CARDIOVASCULAR: S1, S2 regular. No murmurs. PMI not displaced.  GASTROINTESTINAL: Abdomen is soft, nontender, nondistended. Bowel sounds present. No organomegaly. No hernias.  MUSCULOSKELETAL: Complains of left hip pain and left leg externally rotated. Tenderness is present in the left hip.  SKIN: Inspection is normal.  LYMPH NODES: No lymphadenopathy.  NEUROLOGIC: Cranial nerves II through XII intact. Power 5/5 in upper and lower. Sensation intact. Deep tendon reflexes 2+ bilaterally.  PSYCHIATRIC: Mood and affect within normal limits.   LABORATORY DATA:  The patient's left hip x-ray shows a comminuted intertrochanteric fracture of proximal left femur. Shoulder x-ray shows negative fracture for the left shoulder. Chest x-ray shows cardiomegaly with mild pulmonary vascular congestion. Low lung volumes.   WBC 8.4, hemoglobin 14.9, hematocrit 43.6, platelets 168.   The patient's EKG shows normal sinus rhythm with no ST-T changes, 73 beats per minute.   The patient's electrolytes are still pending.   ASSESSMENT AND PLAN:  1.  The patient is a 69 year old male with hypertension, diabetes, with left hip fracture. The patient is at moderate risk for surgery due to hypertension, diabetes, but that can be performed. The patient's EKG is unremarkable and CBC is unremarkable. Had normal stress test, so the patient is at moderate  risk for surgery with hypertension, diabetes, but can be done. Admit him to medical service. Start him on pain medications, IV fluids and DVT prophylaxis. Orthopedic consult with Dr. Marry Guan for possible surgery tomorrow.  2.  Hypertension. The patient's blood pressure is  controlled, so he can continue his lisinopril.  3.  Diabetes mellitus type 2. The patient is on metformin. Once we check the kidney function, we are going to resume the metformin.  4.  History of depression. Continue Celexa 20 mg daily.  5.  Hyperlipidemia. Continue atorvastatin and aspirin.  6. Gastrointestinal prophylaxis with proton pump inhibitors and deep vein thrombosis prophylaxis with Lovenox.   Discussed this plan with the patient's wife and daughter.   TIME SPENT: 60 minutes.    ____________________________ Epifanio Lesches, MD sk:dmm D: 08/09/2014 18:14:13 ET T: 08/09/2014 20:59:04 ET JOB#: [id number]  cc: Epifanio Lesches, MD, <Dictator> Epifanio Lesches MD ELECTRONICALLY SIGNED 09/08/2014 22:55

## 2015-03-23 NOTE — H&P (Signed)
PATIENT NAME:  [name], [name] MR#:  [id number] DATE OF BIRTH:  [birth year]  DATE OF ADMISSION:  08/09/2014  ADDENDUM  The patient's chem-7 is now available. It shows sodium 139, potassium 5.5, chloride 110, bicarb 24, BUN 21, creatinine 1.33, glucose 147.    The patient's hyperkalemia is very mild. We can recheck it tonight in like around 2 hours and see how it is, and we will give a dose of Kayexalate, and otherwise rest of the labs look fine.      ____________________________ Epifanio Lesches, MD sk:dmm D: 08/09/2014 18:29:06 ET T: 08/09/2014 21:23:56 ET JOB#: [id number]  cc: Epifanio Lesches, MD, <Dictator> Epifanio Lesches MD ELECTRONICALLY SIGNED 09/08/2014 22:56

## 2015-03-23 NOTE — Discharge Summary (Signed)
PATIENT NAME:  [name], [name] MR#:  [id number] DATE OF BIRTH:  11/16/1946  DATE OF ADMISSION:  08/09/2014 DATE OF DISCHARGE:  08/15/2014   ADMITTING PHYSICIAN: Epifanio Lesches, MD  DISCHARGING PHYSICIAN: Gladstone Lighter, MD  PRIMARY CARE PHYSICIAN: Dr. Lorelei Pont.  CONSULTATIONS IN THE HOSPITAL:  1. Orthopedic consultation by Dr. Marry Guan.  2. Nephrology consultation by Dr. Murlean Iba and Dr. Juleen China.  3. Vascular consultation by Dr. Delana Meyer for emergency dialysis catheter placement.  4. Dermatology consultation by Dr. Kirkland Hun.   DISCHARGE DIAGNOSES:  1. Fall and left hip fracture requiring operative reduction and internal fixation on 08/12/2014.  2. Acute renal failure due to acute tubular necrosis, requiring temporary hemodialysis for 2 days in the hospital.  3. Acute on chronic postoperative anemia, not requiring any transfusion.  4. Hypertension.  5. Acute exanthematous pustulosis rash on the back and also on the legs.  6. Diabetes mellitus.  7. Depression.  8. Hyperlipidemia.  9. Acute colitis. 10. Hypoxia secondary to atelectasis.   DISCHARGE HOME MEDICATIONS:  1. Aspirin 81 mg p.o. daily.  2. Atorvastatin 40 mg p.o. at bedtime.  3. Celexa 10 mg p.o. daily.  4. Oxycodone 5 mg 1 to 2 tablets q.4 hours p.r.n. for severe pain.  5. Tramadol 50 mg 1 to 2 tablets q.4 hours p.r.n. for mild to moderate pain.  6. Tylenol 500 mg p.o. q.4 hours as needed for fever.  7. Magnesium hydroxide 30 mL q.12 hours as needed for constipation. 8. Bisacodyl 10 mg rectal suppository once a day as needed for constipation.  9. Docusate/senna 50 mg/8.6 mg 1 tablet p.o. b.i.d.  10. Protonix 40 mg p.o. b.i.d.  11. Hydroxyzine 25 mg q.8 hours p.r.n. for itching.  12. Flagyl 500 mg p.o. q.8 hours for 4 more days.  13. Prednisone taper pack over 5 days.  14. Combivent Respimat 1 puff 4 times a day for 1 week.  15. Triamcinolone topical 0.5% cream apply topically to affected rash on the back  twice a day for 7 days.   DISCHARGE OXYGEN: 1 liter as needed.   DISCHARGE DIET: Low-sodium and ADA 1800-calorie diet.   DISCHARGE ACTIVITY: As tolerated.    FOLLOWUP INSTRUCTIONS:  1. Orthopedic followup in 1 to 2 weeks.  2. Physical therapy.  3. Dermatology followup in 2 to 3 weeks.  4. PCP followup in 1 to 2 weeks. 5. Also, the patient will need DVT prophylaxis as recommended by orthopedics and waiting for their recommendations at this time, which will be added to the discharge medication list today prior to discharge.   LABORATORIES AND IMAGING STUDIES PRIOR TO DISCHARGE:  WBC 8.4, hemoglobin 9.3, hematocrit 27.0, platelet count 146.  Sodium 141, potassium 3.5, chloride 105, bicarbonate 31, BUN 23, creatinine 1.28, glucose 157 and calcium of 7.9.  Echo Doppler showing normal LV ejection fraction, EF of 55% to 60%, impaired relaxation pattern of LV diastolic filling, concentric LVH noted.  Chest x-ray from 08/12/2014 showing mild bibasilar opacities, likely atelectasis. No pleural effusion or pulmonary edema seen.  CT of the abdomen and pelvis without contrast on admission showing regional area of colitis involving the descending colon, colonic diverticulosis, with no evidence of diverticulitis. Nonobstructive renal calculus in left kidney, intertrochanteric fracture of left hip, normal kidneys otherwise and atherosclerosis and 3-vessel coronary artery disease noted.  Urine protein electrophoresis showing elevated total protein, but no M-spikes.  Hepatitis antibody test is negative for core antibody.  LDL cholesterol 58, HDL 29, total cholesterol 120, triglycerides of  164. PTH is slightly elevated at 179.  Hepatitis panel is negative.  ANCA is negative.  Urine eosinophils are negative as well.  C3, C4 complement levels are not decreased and within normal limits.  Urinalysis negative for any infection.   BRIEF HOSPITAL COURSE: [name] is a 69 year old male with past medical  history significant for hypertension, diabetes, hyperlipidemia, coronary artery disease, who comes to the hospital after he fell while cutting a tree in his driveway. He had a left hip fracture.   1. Fall and left hip fracture. Though he was admitted on 08/09/2014, he had acute renal failure and hyperkalemia, so surgery was not done until the patient was stabilized. He finally had his surgery done by Dr. Marry Guan on 08/12/2014. Postoperatively, the patient has recovered very well. Working with physical therapy, who has recommended rehab. He is on pain medications p.r.n. for pain and will be going to [facility] today. He was getting subcutaneous heparin for DVT prophylaxis here and will need some Lovenox or heparin for DVT prophylaxis at the rehab, which will be added on by orthopedics prior to discharge today. We have left a message for them.  2. Acute renal failure secondary to acute tubular necrosis. Admission creatinine was 1.3 and then increased to greater than 4.1 with hyperkalemia with oliguria here in the hospital by day 2 and day 3 of admission. The patient had an emergency dialysis catheter put in and had dialysis done on September 11th and also September 12th by nephrology. After that, his urine output has improved, and his creatinine started coming down. All his tests for myeloma and fatty emboli or interstitial nephritis came back negative, so it could be just acute tubular necrosis. His renal function is normalized, and urine output is normal. Appreciate nephrology consult, and he will have his dialysis catheter taken out prior to discharge.  3. Postoperative atelectasis resulting in hypoxia, requiring as high as 4 liters of oxygen, and also some scattered wheeze with remote history of smoking. He was placed on a prednisone taper and also Combivent Respimat. Saturation is improved now, currently is 93% on room air at rest. On exertion, he might drop a little bit, so he is being discharged on 1  liter oxygen as needed on exertion.  4. Acute on chronic postoperative anemia after surgery. Hemoglobin dropped to 9, but stayed stable. Has not required any transfusion.  5. Acute confluent macular rash on his back, spreading into the groin and also the back of his thighs and legs. The patient had a fall several years ago and had much worse rash than this from head to toe, according to wife. He usually clears up the rash by himself. Seen also by dermatologist and had a punch biopsy done. Results are pending at this time. It is not itching, so as per dermatology recommendations, we are doing triamcinolone ointment for now, and he will follow up with dermatology in 2 weeks. He might go through an itching phase in a week or two, according to wife, based on his previous rash. Will do Atarax p.r.n. for then. The rash  was present even before the admission and might have worsened a little bit after the antibiotics.  6. Acute colitis, never been symptomatic except low-grade fever on admission. He has been on Cipro and Flagyl. Cipro is being discontinued because of possibility that it might worsen his rash. Flagyl will continue for another 4 more days, then he can stop it.  7. Hypertension. Was only taking  lisinopril at home prior to admission. Due to renal failure, that was stopped, and blood pressure has been doing okay here, so he is being discharged without any medicine, just a low-salt diet. Norvasc can be added.  8. Diabetes mellitus, again just on metformin at home. Sugars were very well controlled. Metformin stopped due to renal failure again in the hospital.  9. His course has been otherwise uneventful in the hospital.   DISCHARGE CONDITION: Stable.   DISCHARGE DISPOSITION: To [facility].   CODE STATUS: Full code.   TIME SPENT ON DISCHARGE: 40 minutes.    ____________________________ Gladstone Lighter, MD rk:lb D: 08/15/2014 09:33:17 ET T: 08/15/2014 10:26:08  ET JOB#: [id number]  cc: Gladstone Lighter, MD, <Dictator> Dr. Denton Lank A. Dasher, MD Laurice Record. Holley Bouche., MD  Gladstone Lighter MD ELECTRONICALLY SIGNED 08/30/2014 9:50

## 2015-03-23 NOTE — Op Note (Signed)
PATIENT NAME:  [name], [name] MR#:  [id number] DATE OF BIRTH:  1946/09/27  DATE OF PROCEDURE:  08/12/2014  PREOPERATIVE DIAGNOSIS: Left intertrochanteric femur fracture.   POSTOPERATIVE DIAGNOSIS: Left intertrochanteric femur fracture.    PROCEDURE PERFORMED: Open reduction, internal fixation of left intertrochanteric femur fracture.   SURGEON: Laurice Record. Holley Bouche., MD  ANESTHESIA: General.   ESTIMATED BLOOD LOSS: 100 mL.   FLUIDS REPLACED: 200 mL of crystalloid.   DRAINS: None.   IMPLANTS UTILIZED: Synthes 130-degree 11-mm trochanteric fixation nail, 062-IR helical blade, 38 x 5-mm locking screw.   INDICATIONS FOR SURGERY: The patient is a 69 year old male who fell while attempting to assist with downing a tree and landed on his left hip and side. X-rays demonstrated a displaced left intertrochanteric femur fracture. Surgery was initially delayed due to hyperkalemia as well as acute renal failure. The patient's status stabilized. It was felt that he was appropriate for surgical intervention. Risks and benefits of surgical intervention were discussed with the patient. He expressed his understanding of the risks, benefits, and agreed with plans for surgical intervention.   PROCEDURE IN DETAIL: The patient was brought to the operating room and after adequate general endotracheal anesthesia was achieved, the patient was placed on the fracture table. Traction was applied to the left lower extremity and all bony prominences were well padded. A provisional reduction was performed and position confirmed in both AP and lateral planes using the C-arm. The patient's left hip and leg were cleaned and prepped with alcohol and DuraPrep and draped in the usual sterile fashion. A "timeout" was performed as per usual protocol.   A lateral longitudinal incision was made extending from the tip of the greater trochanter proximally. Fascia was incised in line and the abductor muscles were split in line. Dissection  was carried down to the proximal tip of the greater trochanter. A distally threaded guidepin was inserted through the trochanter and into the intramedullary canal. Position was confirmed in AP and lateral planes using the C-arm. A step drill was used to enlarge the entry site. A 130-degree 11-mm trochanteric fixation nail was advanced over the guidewire and good position was noted. A second stab incision was made, and a tissue protector was inserted through the outrigger device and advanced to the lateral cortex of the proximal tibia. A distally threaded guidepin was inserted in the femoral neck and head. Again, good position was noted in both AP and lateral planes using the C-arm. Measurements were obtained and it was felt that a 485-IO helical blade was appropriate.   The lateral cortex was opened. A cannulated reamer was then advanced over the guidewire with a setting of 105-mm depth. A 270-JJ helical blade was then advanced over the guidewire. Good purchase was appreciated. Locking mechanism was engaged proximally. Next, a tissue protector was inserted through the outrigger device and advanced to the lateral cortex distally. Distal fixation was obtained using a 38 x 5-mm locking screw. Outrigger device was removed. Good position of the implants and good reduction were appreciated in both AP and lateral planes using the C-arm.   The wounds were irrigated with copious amounts of normal saline with antibiotic solution. Good hemostasis was noted. Fascia was reapproximated using interrupted sutures of #1 Vicryl. The subcutaneous tissue was approximated in layers using first #0 Vicryl followed by 2-0 Vicryl. Skin was closed with skin staples. A sterile dressing was applied.   The patient tolerated the procedure well. He was transported to the recovery room in stable  condition.     ____________________________ Laurice Record. Holley Bouche., MD jph:ah D: 08/12/2014 14:23:59 ET T: 08/12/2014 14:59:04  ET JOB#: [id number]  cc: Laurice Record. Holley Bouche., MD, <Dictator> JAMES P Holley Bouche MD ELECTRONICALLY SIGNED 08/13/2014 11:21

## 2015-05-22 ENCOUNTER — Telehealth: Payer: Self-pay

## 2015-05-22 NOTE — Telephone Encounter (Signed)
Diabetic Bundle. Left voicemail advising pt her A1C blood test is due. Pt advised to contact PCP's office to schedule.  

## 2015-07-01 ENCOUNTER — Ambulatory Visit (INDEPENDENT_AMBULATORY_CARE_PROVIDER_SITE_OTHER): Payer: PPO | Admitting: Family Medicine

## 2015-07-01 ENCOUNTER — Ambulatory Visit (INDEPENDENT_AMBULATORY_CARE_PROVIDER_SITE_OTHER)
Admission: RE | Admit: 2015-07-01 | Discharge: 2015-07-01 | Disposition: A | Discharge status: Home / Self Care | Payer: PPO | Source: Ambulatory Visit | Attending: Family Medicine | Admitting: Family Medicine

## 2015-07-01 ENCOUNTER — Encounter: Payer: Self-pay | Admitting: Family Medicine

## 2015-07-01 VITALS — BP 130/70 | HR 75 | Temp 98.5°F | Ht 73.5 in | Wt 227.0 lb

## 2015-07-01 DIAGNOSIS — S61219A Laceration without foreign body of unspecified finger without damage to nail, initial encounter: Secondary | ICD-10-CM | POA: Diagnosis not present

## 2015-07-01 DIAGNOSIS — M79644 Pain in right finger(s): Secondary | ICD-10-CM | POA: Diagnosis not present

## 2015-07-01 DIAGNOSIS — M79641 Pain in right hand: Secondary | ICD-10-CM

## 2015-07-01 DIAGNOSIS — Z23 Encounter for immunization: Secondary | ICD-10-CM | POA: Diagnosis not present

## 2015-07-01 MED ORDER — CLINDAMYCIN HCL 300 MG PO CAPS: 300.0000 mg | ORAL_CAPSULE | Freq: Four times a day (QID) | ORAL | Status: DC

## 2015-07-01 MED ORDER — HYDROCODONE-ACETAMINOPHEN 5-325 MG PO TABS: 1.0000 | ORAL_TABLET | Freq: Four times a day (QID) | ORAL | Status: DC | PRN

## 2015-07-01 NOTE — Progress Notes (Signed)
Dr. Frederico Hamman T. Memorie Yokoyama, MD, [facility] [place], [address] Phone: [phone] Fax: [phone]  07/01/2015  Patient: [name], MRN: [id number], DOB: 1946/10/11, 69 y.o.  Primary Physician:  [name], MD  Chief Complaint: Hand Injury  Subjective:   [name] is a 69 y.o. very pleasant male patient who presents with the following:  Dropped an air compressor on his right last Friday. Since then, it has been quite swollen and painful to move his fingers. The dorsum of his hand is also quite tender and swollen. There is minimal bruising.  Badly swollen 3rd finger and R hand  Also on this finger about one week ago he cut his finger with a piece of metal in his shop. He has been trying to keep this covered with a Band-Aid and there is a small bit of ooze coming out of it.  Past Medical History, Surgical History, Social History, Family History, Problem List, Medications, and Allergies have been reviewed and updated if relevant.  Patient Active Problem List   Diagnosis Date Noted  . Radial neck fracture 09/14/2014  . Fracture of femoral neck, left 09/14/2014  . Severe muscle deconditioning 09/12/2014  . Dyspnea 09/12/2014  . Aortic transection   . Peripheral vascular disease   . History of MI (myocardial infarction)   . Anemia associated with acute blood loss 04/30/2011  . Duodenal ulcer with hemorrhage 04/30/2011  . DM (diabetes mellitus) type II controlled peripheral vascular disorder 02/13/2010  . Hyperlipidemia LDL goal <70 08/16/2008  . ERECTILE DYSFUNCTION 08/16/2008  . DEPRESSION 08/16/2008  . Essential hypertension 08/16/2008  . Coronary artery disease involving native coronary artery without angina pectoris 08/16/2008    Past Medical History  Diagnosis Date  . CAD (coronary artery disease)   . MI (myocardial infarction) 1992    Acute anterior MI, thrombolytic therapy  . Diabetes mellitus    Type II  . Depression   . Hyperlipidemia     Statin with joint pain   . Hypertension   . Peripheral vascular disease     Atherosclerotic:R renal artery stenosis  . Fall 2005    fell off house: torn aorta, clavicle fracture, rib fracture, vertebral fractures, lung contusion, coma x 2 weeks  . Erectile dysfunction   . Aortic transection 2005    Traumatic after a fall from a second floor. s/p repair [facility]  . Gallstone pancreatitis   . History of bleeding ulcers   . Basal cell carcinoma   . Fracture of femoral neck, left 08/09/2014    [facility], Dr. Marry Guan  . Fracture of radial neck, left, closed 08/09/2014  . History of ATN 08/09/2014    [facility], 2 days of dialysis ([facility]F)    Past Surgical History  Procedure Laterality Date  . Cholecystectomy    . Cardiovascular surgery      with ruptured Aorta, Dr. Camila Li, [facility]   . Knee surgery      Right   . Thoracotomy      thoracic aorta repair   . Tracheostomy      s/p reversal  . Cardiac catheterization  08/1991    50 % mid-Lad stenosis with clot, 25-505 second marginal  . Orif hip fracture Left 08/12/2014    Dr. Marry Guan    History   Social History  . Marital Status: Married    Spouse Name: N/A  . Number of Children: N/A  . Years of Education:  N/A   Occupational History  . [employer] History Main Topics  . Smoking status: Former Smoker -- 1.50 packs/day for 20 years    Types: Cigarettes    Quit date: 09/13/1991  . Smokeless tobacco: Never Used     Comment: quit post MI  . Alcohol Use: Yes     Comment: occassionally  . Drug Use: No  . Sexual Activity: Not on file   Other Topics Concern  . Not on file   Social History Narrative   No regular exercise     Family History  Problem Relation Age of Onset  . Dementia Mother 46  . Heart attack Father 58  . Diabetes Brother 64  . Heart attack Brother   . Colon cancer Neg Hx   . Stomach cancer Neg Hx   . Esophageal cancer Neg Hx   . Rectal cancer Neg Hx       Allergies  Allergen Reactions  . Dye Fdc Red [Red Dye] Rash  . Lovenox [Enoxaparin Sodium]     Rash, blackness at injection site. Multifactorial and multiple other drugs used at the same time.  [name] Kitchen Penicillins Rash    Medication list reviewed and updated in full in [place].  GEN: No fevers, chills. Nontoxic. Primarily MSK c/o today. MSK: Detailed in the HPI GI: tolerating PO intake without difficulty Neuro: No numbness, parasthesias, or tingling associated. Otherwise the pertinent positives of the ROS are noted above.   Objective:   BP 130/70 mmHg  Pulse 75  Temp(Src) 98.5 F (36.9 C) (Oral)  Ht 6' 1.5" (1.867 m)  Wt 227 lb (102.967 kg)  BMI 29.54 kg/m2   GEN: WDWN, NAD, Non-toxic, Alert & Oriented x 3 HEENT: Atraumatic, Normocephalic.  Ears and Nose: No external deformity. EXTR: No clubbing/cyanosis/edema NEURO: Normal gait.  PSYCH: Normally interactive. Conversant. Not depressed or anxious appearing.  Calm demeanor.    Right hand: Good range of motion at the wrist itself and nontender at the distal radius and ulna. There is diffuse swelling throughout the dorsum of the patient's hand and specifically the third digit is quite swollen. It is tender to palpate throughout basically all of the third digit, more proximally.  On the volar aspect of the proximal phalanx there is a laceration and there is a small amount of yellowish material that is coming out of this. This is wet. Covered by Band-Aid.  Radiology: Dg Hand Complete Right  07/01/2015   CLINICAL DATA:  Right middle finger pain after air compress fell on at 06/28/2015 Initial encounter.  EXAM: RIGHT HAND - COMPLETE 3+ VIEW  COMPARISON:  None.  FINDINGS: The study is limited: the carpus is excluded from view in the AP projection and there is motion blurring in the lateral projection.  Fusiform soft tissue swelling of the middle finger without underlying fracture or dislocation.  IMPRESSION: 1. Middle finger  swelling without fracture. 2. Limited visualization of the carpus.   Electronically Signed   By: Monte Fantasia M.D.   On: 07/01/2015 15:54   Dg Finger Middle Right  07/01/2015   CLINICAL DATA:  Pain following air compressed are falling on finger 3 days prior  EXAM: RIGHT THIRD FINGER 2+V  COMPARISON:  None.  FINDINGS: Frontal, oblique, and lateral views obtained. There is soft tissue swelling over the PIP joint there is no demonstrable fracture or dislocation. Joint spaces appear intact. No erosive change.  IMPRESSION: Soft tissue swelling over the PIP joint  dorsally. No fracture or dislocation. No appreciable arthropathy.   Electronically Signed   By: Lowella Grip III M.D.   On: 07/01/2015 15:53     Assessment and Plan:   Right hand pain - Plan: DG Hand Complete Right  Pain of right middle finger - Plan: DG Finger Middle Right  Finger laceration, initial encounter - Plan: Td vaccine greater than or equal to 7yo preservative free IM  Need for prophylactic vaccination with tetanus-diphtheria (TD) - Plan: Td vaccine greater than or equal to 7yo preservative free IM  Trauma of the right hand and finger, third, with extensive bone bruise and extensive soft tissue bruising. I agree, I see no occult fractures on plain radiographs.  Elevate the hand, ice as needed.  Given the appearance of the laceration on the volar aspect of the patient's third finger, I am going to place him on some anabiotic. It does not appear to be warm or grossly infected, but given that pain in and around that digit, and inexact exam. He knows it forever he gets a fever ordered this digit becomes red or hot or warm or worsens then he is to let us know ASAP.  New Prescriptions   CLINDAMYCIN (CLEOCIN) 300 MG CAPSULE    Take 1 capsule (300 mg total) by mouth 4 (four) times daily.   HYDROCODONE-ACETAMINOPHEN (NORCO/VICODIN) 5-325 MG PER TABLET    Take 1 tablet by mouth every 6 (six) hours as needed for moderate pain.    Orders Placed This Encounter  Procedures  . DG Finger Middle Right  . DG Hand Complete Right  . Td vaccine greater than or equal to 7yo preservative free IM    Signed,  Joleena Weisenburger T. Rodney Yera, MD   Patient's Medications  New Prescriptions   CLINDAMYCIN (CLEOCIN) 300 MG CAPSULE    Take 1 capsule (300 mg total) by mouth 4 (four) times daily.   HYDROCODONE-ACETAMINOPHEN (NORCO/VICODIN) 5-325 MG PER TABLET    Take 1 tablet by mouth every 6 (six) hours as needed for moderate pain.  Previous Medications   AMLODIPINE (NORVASC) 5 MG TABLET    Take 1 tablet (5 mg total) by mouth daily.   ASPIRIN 81 MG TABLET    Take 81 mg by mouth daily.   ATORVASTATIN (LIPITOR) 40 MG TABLET    TAKE 1 TABLET EVERY DAY   CITALOPRAM (CELEXA) 20 MG TABLET    TAKE 1 TABLET EVERY DAY   LISINOPRIL (PRINIVIL,ZESTRIL) 10 MG TABLET    Take 1 tablet (10 mg total) by mouth daily.   METFORMIN (GLUCOPHAGE-XR) 500 MG 24 HR TABLET    TAKE 1 TABLET EVERY DAY  Modified Medications   No medications on file  Discontinued Medications   No medications on file

## 2015-07-01 NOTE — Progress Notes (Signed)
Pre visit review using our clinic review tool, if applicable. No additional management support is needed unless otherwise documented below in the visit note. 

## 2015-07-05 ENCOUNTER — Telehealth: Payer: Self-pay

## 2015-07-05 MED ORDER — SULFAMETHOXAZOLE-TRIMETHOPRIM 800-160 MG PO TABS: 2.0000 | ORAL_TABLET | Freq: Two times a day (BID) | ORAL | Status: DC

## 2015-07-05 NOTE — Telephone Encounter (Signed)
D/c clindaymycin  Take benadryl 1-2 tablets every 6 hours until improving. Likely allergic reaction  Change to septra ds, 2 po bid, #24 (6 days worth)

## 2015-07-05 NOTE — Telephone Encounter (Signed)
Pt left v/m; pt seen 07/01/15; pt taking clindamycin and has rash on upper body; pt has stopped med and wants to know what needs to do. Pt request cb.[place].

## 2015-07-05 NOTE — Telephone Encounter (Signed)
[name] notified as instructed by telephone.  Septra DS prescription sent in to [facility].

## 2015-07-05 NOTE — Telephone Encounter (Signed)
Pt daughter in law called to check on the status of this request. Please call pt at [phone].

## 2015-10-07 ENCOUNTER — Other Ambulatory Visit: Payer: Self-pay | Admitting: Family Medicine

## 2015-10-07 NOTE — Telephone Encounter (Signed)
Please call and schedule CPE with fasting labs prior for Dr. Copland.  

## 2015-10-17 ENCOUNTER — Other Ambulatory Visit: Payer: Self-pay | Admitting: Family Medicine

## 2015-10-17 NOTE — Telephone Encounter (Signed)
Please call and schedule [employer] [facility] with fasting labs for Dr. Lorelei Pont.

## 2015-10-18 ENCOUNTER — Ambulatory Visit (INDEPENDENT_AMBULATORY_CARE_PROVIDER_SITE_OTHER): Payer: PPO

## 2015-10-18 DIAGNOSIS — Z23 Encounter for immunization: Secondary | ICD-10-CM

## 2015-11-11 ENCOUNTER — Other Ambulatory Visit (INDEPENDENT_AMBULATORY_CARE_PROVIDER_SITE_OTHER): Payer: PPO

## 2015-11-11 DIAGNOSIS — E119 Type 2 diabetes mellitus without complications: Secondary | ICD-10-CM | POA: Diagnosis not present

## 2015-11-11 DIAGNOSIS — Z79899 Other long term (current) drug therapy: Secondary | ICD-10-CM | POA: Diagnosis not present

## 2015-11-11 DIAGNOSIS — E785 Hyperlipidemia, unspecified: Secondary | ICD-10-CM

## 2015-11-11 DIAGNOSIS — Z125 Encounter for screening for malignant neoplasm of prostate: Secondary | ICD-10-CM | POA: Diagnosis not present

## 2015-11-11 LAB — CBC WITH DIFFERENTIAL/PLATELET
Basophils Absolute: 0 10*3/uL (ref 0.0–0.1)
Basophils Relative: 0.9 % (ref 0.0–3.0)
Eosinophils Absolute: 0.2 10*3/uL (ref 0.0–0.7)
Eosinophils Relative: 3.8 % (ref 0.0–5.0)
HCT: 45.2 % (ref 39.0–52.0)
Hemoglobin: 15.5 g/dL (ref 13.0–17.0)
Lymphocytes Relative: 28 % (ref 12.0–46.0)
Lymphs Abs: 1.5 10*3/uL (ref 0.7–4.0)
MCHC: 34.3 g/dL (ref 30.0–36.0)
MCV: 87.5 fl (ref 78.0–100.0)
Monocytes Absolute: 0.4 10*3/uL (ref 0.1–1.0)
Monocytes Relative: 8 % (ref 3.0–12.0)
Neutro Abs: 3.2 10*3/uL (ref 1.4–7.7)
Neutrophils Relative %: 59.3 % (ref 43.0–77.0)
Platelets: 183 10*3/uL (ref 150.0–400.0)
RBC: 5.17 Mil/uL (ref 4.22–5.81)
RDW: 13 % (ref 11.5–15.5)
WBC: 5.4 10*3/uL (ref 4.0–10.5)

## 2015-11-11 LAB — HEPATIC FUNCTION PANEL
ALT: 23 U/L (ref 0–53)
AST: 16 U/L (ref 0–37)
Albumin: 4.7 g/dL (ref 3.5–5.2)
Alkaline Phosphatase: 86 U/L (ref 39–117)
Bilirubin, Direct: 0.2 mg/dL (ref 0.0–0.3)
Total Bilirubin: 1.1 mg/dL (ref 0.2–1.2)
Total Protein: 7 g/dL (ref 6.0–8.3)

## 2015-11-11 LAB — BASIC METABOLIC PANEL WITH GFR
BUN: 17 mg/dL (ref 6–23)
CO2: 26 meq/L (ref 19–32)
Calcium: 9.5 mg/dL (ref 8.4–10.5)
Chloride: 105 meq/L (ref 96–112)
Creatinine, Ser: 1.11 mg/dL (ref 0.40–1.50)
GFR: 69.92 mL/min
Glucose, Bld: 128 mg/dL — ABNORMAL HIGH (ref 70–99)
Potassium: 5.1 meq/L (ref 3.5–5.1)
Sodium: 138 meq/L (ref 135–145)

## 2015-11-11 LAB — LIPID PANEL
Cholesterol: 133 mg/dL (ref 0–200)
HDL: 36.8 mg/dL — ABNORMAL LOW
LDL Cholesterol: 75 mg/dL (ref 0–99)
NonHDL: 96.16
Total CHOL/HDL Ratio: 4
Triglycerides: 108 mg/dL (ref 0.0–149.0)
VLDL: 21.6 mg/dL (ref 0.0–40.0)

## 2015-11-11 LAB — PSA, MEDICARE: PSA: 1.64 ng/mL (ref 0.10–4.00)

## 2015-11-11 LAB — HEMOGLOBIN A1C: Hgb A1c MFr Bld: 6.1 % (ref 4.6–6.5)

## 2015-11-14 LAB — MICROALBUMIN, URINE: Microalb, Ur: 3.3 mg/dL

## 2015-11-18 ENCOUNTER — Encounter: Payer: Self-pay | Admitting: Family Medicine

## 2015-11-18 ENCOUNTER — Ambulatory Visit (INDEPENDENT_AMBULATORY_CARE_PROVIDER_SITE_OTHER): Payer: PPO | Admitting: Family Medicine

## 2015-11-18 VITALS — BP 140/64 | HR 75 | Temp 97.6°F | Ht 68.0 in | Wt 228.0 lb

## 2015-11-18 DIAGNOSIS — I739 Peripheral vascular disease, unspecified: Secondary | ICD-10-CM

## 2015-11-18 DIAGNOSIS — I2510 Atherosclerotic heart disease of native coronary artery without angina pectoris: Secondary | ICD-10-CM | POA: Diagnosis not present

## 2015-11-18 DIAGNOSIS — Z0000 Encounter for general adult medical examination without abnormal findings: Secondary | ICD-10-CM | POA: Diagnosis not present

## 2015-11-18 DIAGNOSIS — E1151 Type 2 diabetes mellitus with diabetic peripheral angiopathy without gangrene: Secondary | ICD-10-CM

## 2015-11-18 DIAGNOSIS — F3340 Major depressive disorder, recurrent, in remission, unspecified: Secondary | ICD-10-CM

## 2015-11-18 DIAGNOSIS — I252 Old myocardial infarction: Secondary | ICD-10-CM | POA: Diagnosis not present

## 2015-11-18 DIAGNOSIS — Z23 Encounter for immunization: Secondary | ICD-10-CM | POA: Diagnosis not present

## 2015-11-18 MED ORDER — METFORMIN HCL ER 500 MG PO TB24: 500.0000 mg | ORAL_TABLET | Freq: Every day | ORAL | Status: DC

## 2015-11-18 MED ORDER — AMLODIPINE BESYLATE 5 MG PO TABS: 5.0000 mg | ORAL_TABLET | Freq: Every day | ORAL | Status: DC

## 2015-11-18 MED ORDER — ATORVASTATIN CALCIUM 40 MG PO TABS: 40.0000 mg | ORAL_TABLET | Freq: Every day | ORAL | Status: DC

## 2015-11-18 MED ORDER — LISINOPRIL 10 MG PO TABS: 10.0000 mg | ORAL_TABLET | Freq: Every day | ORAL | Status: DC

## 2015-11-18 MED ORDER — CITALOPRAM HYDROBROMIDE 20 MG PO TABS: 20.0000 mg | ORAL_TABLET | Freq: Every day | ORAL | Status: DC

## 2015-11-18 NOTE — Progress Notes (Signed)
Dr. Frederico Hamman T. Jozef Eisenbeis, MD, [facility] [place], [address] Phone: [phone] Fax: [phone]  11/18/2015  Patient: [name], MRN: [id number], DOB: Dec 17, 1945, 69 y.o.  Primary Physician:  [name], MD   Chief Complaint  Patient presents with  . Annual Exam    Medicare Wellness   Subjective:   [name] is a 69 y.o. pleasant patient who presents for a medicare wellness examination:  Preventative Health Maintenance Visit:  Health Maintenance Summary Reviewed and updated, unless pt declines services.  Tobacco History Reviewed. Alcohol: No concerns, no excessive use Exercise Habits: rare activity, rec at least 30 mins 5 times a week STD concerns: no risk or activity to increase risk Drug Use: None Encouraged self-testicular check  Health Maintenance  Topic Date Due  . Hepatitis C Screening  1946/07/20  . FOOT EXAM  07/21/2014  . OPHTHALMOLOGY EXAM  07/21/2014  . HEMOGLOBIN A1C  05/11/2016  . INFLUENZA VACCINE  06/30/2016  . COLONOSCOPY  07/20/2022  . TETANUS/TDAP  06/30/2025  . ZOSTAVAX  Addressed  . PNA vac Low Risk Adult  Completed    Immunization History  Administered Date(s) Administered  . Influenza Split 10/04/2012  . Influenza Whole 08/16/2008, 08/01/2014  . Influenza,inj,Quad PF,36+ Mos 10/11/2013, 10/18/2015  . Pneumococcal Conjugate-13 11/18/2015  . Pneumococcal Polysaccharide-23 06/13/2012  . Td 07/01/2015    Patient Active Problem List   Diagnosis Date Noted  . Radial neck fracture 09/14/2014  . Fracture of femoral neck, left ([place]) 09/14/2014  . Aortic transection   . Peripheral vascular disease ([place])   . History of MI (myocardial infarction)   . Duodenal ulcer with hemorrhage 04/30/2011  . DM (diabetes mellitus) type II controlled peripheral vascular disorder ([place]) 02/13/2010  . Hyperlipidemia LDL goal <70 08/16/2008  . ERECTILE DYSFUNCTION 08/16/2008  . Major  depressive disorder, recurrent, in remission ([place]) 08/16/2008  . Essential hypertension 08/16/2008  . Coronary artery disease involving native coronary artery without angina pectoris 08/16/2008   Past Medical History  Diagnosis Date  . CAD (coronary artery disease)   . MI (myocardial infarction) ([place]) 1992    Acute anterior MI, thrombolytic therapy  . Diabetes mellitus     Type II  . Depression   . Hyperlipidemia     Statin with joint pain   . Hypertension   . Peripheral vascular disease ([place])     Atherosclerotic:R renal artery stenosis  . Fall 2005    fell off house: torn aorta, clavicle fracture, rib fracture, vertebral fractures, lung contusion, coma x 2 weeks  . Erectile dysfunction   . Aortic transection 2005    Traumatic after a fall from a second floor. s/p repair at [facility]  . Gallstone pancreatitis   . History of bleeding ulcers   . Basal cell carcinoma   . Fracture of femoral neck, left ([place]) 08/09/2014    ORIF, Dr. Marry Guan  . Fracture of radial neck, left, closed 08/09/2014  . History of ATN 08/09/2014    [facility], 2 days of dialysis (ARF)   Past Surgical History  Procedure Laterality Date  . Cholecystectomy    . Cardiovascular surgery      with ruptured Aorta, Dr. Camila Li, [facility]   . Knee surgery      Right   . Thoracotomy      thoracic aorta repair   . Tracheostomy      s/p reversal  . Cardiac catheterization  08/1991  50 % mid-Lad stenosis with clot, 25-505 second marginal  . Orif hip fracture Left 08/12/2014    Dr. Marry Guan   Social History   Social History  . Marital Status: Married    Spouse Name: N/A  . Number of Children: N/A  . Years of Education: N/A   Occupational History  . 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 Smoking status: Former Smoker -- 1.50 packs/day for 20 years    Types: Cigarettes    Quit date: 09/13/1991  . Smokeless tobacco: Never Used     Comment: quit post MI  . Alcohol Use: Yes     Comment: occassionally  .  Drug Use: No  . Sexual Activity: Not on file   Other Topics Concern  . Not on file   Social History Narrative   No regular exercise    Family History  Problem Relation Age of Onset  . Dementia Mother 3  . Heart attack Father 2  . Diabetes Brother 36  . Heart attack Brother   . Colon cancer Neg Hx   . Stomach cancer Neg Hx   . Esophageal cancer Neg Hx   . Rectal cancer Neg Hx    Allergies  Allergen Reactions  . Clindamycin/Lincomycin Rash  . Dye Fdc Red [Red Dye] Rash  . Lovenox [Enoxaparin Sodium]     Rash, blackness at injection site. Multifactorial and multiple other drugs used at the same time.  [name] Kitchen Penicillins Rash    Medication list has been reviewed and updated.   General: Denies fever, chills, sweats. No significant weight loss. Eyes: Denies blurring,significant itching ENT: Denies earache, sore throat, and hoarseness. Cardiovascular: Denies chest pains, palpitations, dyspnea on exertion Respiratory: Denies cough, dyspnea at rest,wheeezing Breast: no concerns about lumps GI: Denies nausea, vomiting, diarrhea, constipation, change in bowel habits, abdominal pain, melena, hematochezia GU: Denies penile discharge, ED, urinary flow / outflow problems. No STD concerns. Musculoskeletal: Denies back pain, joint pain Derm: Denies rash, itching Neuro: Denies  paresthesias, frequent falls, frequent headaches Psych: Denies depression, anxiety Endocrine: Denies cold intolerance, heat intolerance, polydipsia Heme: Denies enlarged lymph nodes Allergy: No hayfever  Objective:   BP 140/64 mmHg  Pulse 75  Temp(Src) 97.6 F (36.4 C) (Oral)  Ht 5' 8"  (1.727 m)  Wt 228 lb (103.42 kg)  BMI 34.68 kg/m2  The patient completed a fall screen and PHQ-2 and PHQ-9 if necessary, which is documented in the EHR. The CMA/LPN/RN who assisted the patient verbally completed with them and documented results in [place].   Hearing Screening   Method: Audiometry   125Hz   250Hz  500Hz  1000Hz  2000Hz  4000Hz  8000Hz   Right ear:   20 20 20  0   Left ear:   20 25 20  40     Visual Acuity Screening   Right eye Left eye Both eyes  Without correction:     With correction: 20/30 20/40 20/40     GEN: well developed, well nourished, no acute distress Eyes: conjunctiva and lids normal, PERRLA, EOMI ENT: TM clear, nares clear, oral exam WNL Neck: supple, no lymphadenopathy, no thyromegaly, no JVD Pulm: clear to auscultation and percussion, respiratory effort normal CV: regular rate and rhythm, S1-S2, no murmur, rub or gallop, no bruits, peripheral pulses normal and symmetric, no cyanosis, clubbing, edema or varicosities GI: soft, non-tender; no hepatosplenomegaly, masses; active bowel sounds all quadrants GU: no hernia, testicular mass, penile discharge Lymph: no cervical, axillary or inguinal adenopathy MSK: gait normal,  muscle tone and strength WNL, no joint swelling, effusions, discoloration, crepitus  SKIN: clear, good turgor, color WNL, no rashes, lesions, or ulcerations Neuro: normal mental status, normal strength, sensation, and motion Psych: alert; oriented to person, place and time, normally interactive and not anxious or depressed in appearance.  All labs reviewed with patient.  Lipids:    Component Value Date/Time   CHOL 133 11/11/2015 0908   CHOL 120 08/10/2014 2128   TRIG 108.0 11/11/2015 0908   TRIG 164 08/10/2014 2128   HDL 36.80* 11/11/2015 0908   HDL 29* 08/10/2014 2128   LDLDIRECT 173.5 05/23/2012 1215   VLDL 21.6 11/11/2015 0908   VLDL 33 08/10/2014 2128   CHOLHDL 4 11/11/2015 0908   CBC: CBC Latest Ref Rng 11/11/2015 09/12/2014 08/23/2014  WBC 4.0 - 10.5 K/uL 5.4 7.0 8.4  Hemoglobin 13.0 - 17.0 g/dL 15.5 12.5(L) 10.6(L)  Hematocrit 39.0 - 52.0 % 45.2 37.5(L) 31.5(L)  Platelets 150.0 - 400.0 K/uL 183.0 218.0 992    Basic Metabolic Panel:    Component Value Date/Time   NA 138 11/11/2015 0908   NA 140 08/23/2014 0619   K 5.1  11/11/2015 0908   K 4.7 08/23/2014 0619   CL 105 11/11/2015 0908   CL 109* 08/23/2014 0619   CO2 26 11/11/2015 0908   CO2 25 08/23/2014 0619   BUN 17 11/11/2015 0908   BUN 14 08/23/2014 0619   CREATININE 1.11 11/11/2015 0908   CREATININE 0.99 08/23/2014 0619   GLUCOSE 128* 11/11/2015 0908   GLUCOSE 129* 08/23/2014 0619   CALCIUM 9.5 11/11/2015 0908   CALCIUM 8.2* 08/23/2014 0619   Lab Results  Component Value Date   HGBA1C 6.1 11/11/2015    Hepatic Function Latest Ref Rng 11/11/2015 08/23/2014 08/21/2014  Total Protein 6.0 - 8.3 g/dL 7.0 6.0(L) 5.8(L)  Albumin 3.5 - 5.2 g/dL 4.7 3.2(L) 3.1(L)  AST 0 - 37 U/L 16 27 39(H)  ALT 0 - 53 U/L 23 41 49  Alk Phosphatase 39 - 117 U/L 86 73 69  Total Bilirubin 0.2 - 1.2 mg/dL 1.1 0.9 1.0  Bilirubin, Direct 0.0 - 0.3 mg/dL 0.2 - -    Lab Results  Component Value Date   TSH 0.74 07/13/2013   Lab Results  Component Value Date   PSA 1.64 11/11/2015   PSA 1.14 07/13/2013   PSA 1.71 02/26/2011    Assessment and Plan:   Healthcare maintenance  Coronary artery disease involving native coronary artery of native heart without angina pectoris  DM (diabetes mellitus) type II controlled peripheral vascular disorder (HCC)  History of MI (myocardial infarction)  Peripheral vascular disease (HCC)  Major depressive disorder, recurrent, in remission ([place])  Basically doing well, all chronic conditions stable Work on [employer]  Health Maintenance Exam: The patient's preventative maintenance and recommended screening tests for an annual wellness exam were reviewed in full today. Brought up to date unless services declined.  Counselled on the importance of diet, exercise, and its role in overall health and mortality. The patient's FH and SH was reviewed, including their home life, tobacco status, and drug and alcohol status.  I have personally reviewed the Medicare Annual Wellness questionnaire and have noted 1. The patient's medical and  social history 2. Their use of alcohol, tobacco or illicit drugs 3. Their current medications and supplements 4. The patient's functional ability including ADL's, fall risks, home safety risks and hearing or visual             impairment. 5.  Diet and physical activities 6. Evidence for depression or mood disorders 7. Reviewed Updated provider list, see scanned forms and [facility] Snapshot.   The patients weight, height, BMI and visual acuity have been recorded in the chart I have made referrals, counseling and provided education to the patient based review of the above and I have provided the pt with a written personalized care plan for preventive services.  I have provided the patient with a copy of your personalized plan for preventive services. Instructed to take the time to review along with their updated medication list.  Follow-up: No Follow-up on file. Or follow-up in 1 year for complete physical examination  New Prescriptions   No medications on file   Modified Medications   Modified Medication Previous Medication   AMLODIPINE (NORVASC) 5 MG TABLET amLODipine (NORVASC) 5 MG tablet      Take 1 tablet (5 mg total) by mouth daily.    TAKE 1 TABLET EVERY DAY   ATORVASTATIN (LIPITOR) 40 MG TABLET atorvastatin (LIPITOR) 40 MG tablet      Take 1 tablet (40 mg total) by mouth daily.    TAKE 1 TABLET EVERY DAY   CITALOPRAM (CELEXA) 20 MG TABLET citalopram (CELEXA) 20 MG tablet      Take 1 tablet (20 mg total) by mouth daily.    TAKE 1 TABLET EVERY DAY   LISINOPRIL (PRINIVIL,ZESTRIL) 10 MG TABLET lisinopril (PRINIVIL,ZESTRIL) 10 MG tablet      Take 1 tablet (10 mg total) by mouth daily.    TAKE 1 TABLET EVERY DAY   METFORMIN (GLUCOPHAGE-XR) 500 MG 24 HR TABLET metFORMIN (GLUCOPHAGE-XR) 500 MG 24 hr tablet      Take 1 tablet (500 mg total) by mouth daily.    TAKE 1 TABLET EVERY DAY   Orders Placed This Encounter  Procedures  . Pneumococcal conjugate vaccine 13-valent IM     Signed,  Chinenye Katzenberger T. Ezell Melikian, MD   Patient's Medications  New Prescriptions   No medications on file  Previous Medications   ASPIRIN 81 MG TABLET    Take 81 mg by mouth daily.  Modified Medications   Modified Medication Previous Medication   AMLODIPINE (NORVASC) 5 MG TABLET amLODipine (NORVASC) 5 MG tablet      Take 1 tablet (5 mg total) by mouth daily.    TAKE 1 TABLET EVERY DAY   ATORVASTATIN (LIPITOR) 40 MG TABLET atorvastatin (LIPITOR) 40 MG tablet      Take 1 tablet (40 mg total) by mouth daily.    TAKE 1 TABLET EVERY DAY   CITALOPRAM (CELEXA) 20 MG TABLET citalopram (CELEXA) 20 MG tablet      Take 1 tablet (20 mg total) by mouth daily.    TAKE 1 TABLET EVERY DAY   LISINOPRIL (PRINIVIL,ZESTRIL) 10 MG TABLET lisinopril (PRINIVIL,ZESTRIL) 10 MG tablet      Take 1 tablet (10 mg total) by mouth daily.    TAKE 1 TABLET EVERY DAY   METFORMIN (GLUCOPHAGE-XR) 500 MG 24 HR TABLET metFORMIN (GLUCOPHAGE-XR) 500 MG 24 hr tablet      Take 1 tablet (500 mg total) by mouth daily.    TAKE 1 TABLET EVERY DAY  Discontinued Medications   CLINDAMYCIN (CLEOCIN) 300 MG CAPSULE    Take 1 capsule (300 mg total) by mouth 4 (four) times daily.   HYDROCODONE-ACETAMINOPHEN (NORCO/VICODIN) 5-325 MG PER TABLET    Take 1 tablet by mouth every 6 (six) hours as needed for moderate pain.   SULFAMETHOXAZOLE-TRIMETHOPRIM (BACTRIM DS,SEPTRA  DS) 800-160 MG PER TABLET    Take 2 tablets by mouth 2 (two) times daily.

## 2015-11-18 NOTE — Progress Notes (Signed)
Pre visit review using our clinic review tool, if applicable. No additional management support is needed unless otherwise documented below in the visit note. 

## 2016-03-19 DIAGNOSIS — H2513 Age-related nuclear cataract, bilateral: Secondary | ICD-10-CM | POA: Diagnosis not present

## 2016-03-19 LAB — HM DIABETES EYE EXAM

## 2016-08-24 ENCOUNTER — Encounter: Payer: Self-pay | Admitting: Family Medicine

## 2016-08-24 ENCOUNTER — Ambulatory Visit (INDEPENDENT_AMBULATORY_CARE_PROVIDER_SITE_OTHER): Payer: PPO | Admitting: Family Medicine

## 2016-08-24 VITALS — BP 130/80 | HR 72 | Temp 98.3°F | Ht 68.0 in | Wt 236.2 lb

## 2016-08-24 DIAGNOSIS — I2510 Atherosclerotic heart disease of native coronary artery without angina pectoris: Secondary | ICD-10-CM

## 2016-08-24 DIAGNOSIS — I10 Essential (primary) hypertension: Secondary | ICD-10-CM

## 2016-08-24 DIAGNOSIS — R5383 Other fatigue: Secondary | ICD-10-CM

## 2016-08-24 DIAGNOSIS — E785 Hyperlipidemia, unspecified: Secondary | ICD-10-CM | POA: Diagnosis not present

## 2016-08-24 DIAGNOSIS — Z23 Encounter for immunization: Secondary | ICD-10-CM

## 2016-08-24 DIAGNOSIS — R0609 Other forms of dyspnea: Secondary | ICD-10-CM | POA: Diagnosis not present

## 2016-08-24 DIAGNOSIS — I252 Old myocardial infarction: Secondary | ICD-10-CM

## 2016-08-24 DIAGNOSIS — M791 Myalgia: Secondary | ICD-10-CM

## 2016-08-24 DIAGNOSIS — E1151 Type 2 diabetes mellitus with diabetic peripheral angiopathy without gangrene: Secondary | ICD-10-CM

## 2016-08-24 DIAGNOSIS — M609 Myositis, unspecified: Secondary | ICD-10-CM

## 2016-08-24 DIAGNOSIS — IMO0001 Reserved for inherently not codable concepts without codable children: Secondary | ICD-10-CM

## 2016-08-24 LAB — BASIC METABOLIC PANEL WITH GFR
BUN: 16 mg/dL (ref 6–23)
CO2: 26 meq/L (ref 19–32)
Calcium: 9.4 mg/dL (ref 8.4–10.5)
Chloride: 104 meq/L (ref 96–112)
Creatinine, Ser: 1.02 mg/dL (ref 0.40–1.50)
GFR: 76.91 mL/min
Glucose, Bld: 134 mg/dL — ABNORMAL HIGH (ref 70–99)
Potassium: 5 meq/L (ref 3.5–5.1)
Sodium: 139 meq/L (ref 135–145)

## 2016-08-24 LAB — HEPATIC FUNCTION PANEL
ALT: 26 U/L (ref 0–53)
AST: 18 U/L (ref 0–37)
Albumin: 4.6 g/dL (ref 3.5–5.2)
Alkaline Phosphatase: 81 U/L (ref 39–117)
Bilirubin, Direct: 0.2 mg/dL (ref 0.0–0.3)
Total Bilirubin: 0.9 mg/dL (ref 0.2–1.2)
Total Protein: 7.4 g/dL (ref 6.0–8.3)

## 2016-08-24 LAB — VITAMIN B12: Vitamin B-12: 453 pg/mL (ref 211–911)

## 2016-08-24 LAB — LIPID PANEL
Cholesterol: 159 mg/dL (ref 0–200)
HDL: 43.9 mg/dL
LDL Cholesterol: 85 mg/dL (ref 0–99)
NonHDL: 114.76
Total CHOL/HDL Ratio: 4
Triglycerides: 147 mg/dL (ref 0.0–149.0)
VLDL: 29.4 mg/dL (ref 0.0–40.0)

## 2016-08-24 LAB — CBC WITH DIFFERENTIAL/PLATELET
Basophils Absolute: 0 10*3/uL (ref 0.0–0.1)
Basophils Relative: 0.6 % (ref 0.0–3.0)
Eosinophils Absolute: 0.2 10*3/uL (ref 0.0–0.7)
Eosinophils Relative: 3.1 % (ref 0.0–5.0)
HCT: 45.1 % (ref 39.0–52.0)
Hemoglobin: 15.5 g/dL (ref 13.0–17.0)
Lymphocytes Relative: 20.9 % (ref 12.0–46.0)
Lymphs Abs: 1.5 10*3/uL (ref 0.7–4.0)
MCHC: 34.4 g/dL (ref 30.0–36.0)
MCV: 88 fl (ref 78.0–100.0)
Monocytes Absolute: 0.5 10*3/uL (ref 0.1–1.0)
Monocytes Relative: 6.7 % (ref 3.0–12.0)
Neutro Abs: 4.9 10*3/uL (ref 1.4–7.7)
Neutrophils Relative %: 68.7 % (ref 43.0–77.0)
Platelets: 199 10*3/uL (ref 150.0–400.0)
RBC: 5.13 Mil/uL (ref 4.22–5.81)
RDW: 13.4 % (ref 11.5–15.5)
WBC: 7 10*3/uL (ref 4.0–10.5)

## 2016-08-24 LAB — TSH: TSH: 1.22 u[IU]/mL (ref 0.35–4.50)

## 2016-08-24 LAB — HEMOGLOBIN A1C: Hgb A1c MFr Bld: 6.4 % (ref 4.6–6.5)

## 2016-08-24 LAB — CK: Total CK: 57 U/L (ref 7–232)

## 2016-08-24 LAB — VITAMIN D 25 HYDROXY (VIT D DEFICIENCY, FRACTURES): VITD: 25.08 ng/mL — ABNORMAL LOW (ref 30.00–100.00)

## 2016-08-24 NOTE — Patient Instructions (Signed)

## 2016-08-24 NOTE — Progress Notes (Signed)
Pre visit review using our clinic review tool, if applicable. No additional management support is needed unless otherwise documented below in the visit note. 

## 2016-08-24 NOTE — Progress Notes (Signed)
Dr. Frederico Hamman T. Zeferino Mounts, MD, [facility] Medicine [place], [address] Phone: [phone] Fax: [phone]  08/24/2016  Patient: [name], MRN: [id number], DOB: [birth year], 70 y.o.  Primary Physician:  [name], MD   Chief Complaint  Patient presents with  . Headache    x 2 to 3 weeks  . Anxiety  . Shortness of Breath    on excertion  . Fatigue  . Muscle Aches   Subjective:   [name] is a 70 y.o. very pleasant male patient who presents with the following:  Patient presents with multiple complaints.  Notable h/o prior MI in 1992 with CAD and has not seen cardiology in several years. He has been having worsening SOB with exertion over the last month.  No chest pain, no arm pain.  Combination of everything - muscles and no energy.  Getting some SOB - able to go up or down steps, but anything more than this, will get some SOB.  H/o MI  Headaches - allergies?  Getting upset more easily. More irritable - but later says not much different than prior years - on Celexa.   DM has been stable  Past Medical History, Surgical History, Social History, Family History, Problem List, Medications, and Allergies have been reviewed and updated if relevant.  Patient Active Problem List   Diagnosis Date Noted  . Radial neck fracture 09/14/2014  . Fracture of femoral neck, left ([place]) 09/14/2014  . Aortic transection   . Peripheral vascular disease ([place])   . History of MI (myocardial infarction)   . Duodenal ulcer with hemorrhage 04/30/2011  . DM (diabetes mellitus) type II controlled peripheral vascular disorder ([place]) 02/13/2010  . Hyperlipidemia LDL goal <70 08/16/2008  . ERECTILE DYSFUNCTION 08/16/2008  . Major depressive disorder, recurrent, in remission ([place]) 08/16/2008  . Essential hypertension 08/16/2008  . Coronary artery disease involving native coronary artery without angina pectoris 08/16/2008    Past  Medical History:  Diagnosis Date  . Aortic transection 2005   Traumatic after a fall from a second floor. [facility]  . Basal cell carcinoma   . CAD (coronary artery disease)   . Depression   . Diabetes mellitus    Type II  . Erectile dysfunction   . Fall 2005   fell off house: torn aorta, clavicle fracture, rib fracture, vertebral fractures, lung contusion, coma x 2 weeks  . Fracture of femoral neck, left ([place]) 08/09/2014   ORIF, Dr. Marry Guan  . Fracture of radial neck, left, closed 08/09/2014  . Gallstone pancreatitis   . History of ATN 08/09/2014   [facility], 2 days of dialysis (ARF)  . History of bleeding ulcers   . Hyperlipidemia    Statin with joint pain   . Hypertension   . MI (myocardial infarction) ([place]) 1992   Acute anterior MI, thrombolytic therapy  . Peripheral vascular disease [facility])    Atherosclerotic:R renal artery stenosis    Past Surgical History:  Procedure Laterality Date  . CARDIAC CATHETERIZATION  08/1991   50 % mid-Lad stenosis with clot, 25-505 second marginal  . CARDIOVASCULAR SURGERY     with ruptured Aorta, Dr. Camila Li, [facility]   . CHOLECYSTECTOMY    . KNEE SURGERY     Right   . ORIF HIP FRACTURE Left 08/12/2014   Dr. Marry Guan  . THORACOTOMY     thoracic aorta repair   . TRACHEOSTOMY     s/p  reversal    Social History   Social History  . Marital status: Married    Spouse name: N/A  . Number of children: N/A  . Years of education: N/A   Occupational History  . 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 Smoking status: Former Smoker    Packs/day: 1.50    Years: 20.00    Types: Cigarettes    Quit date: 09/13/1991  . Smokeless tobacco: Never Used     Comment: quit post MI  . Alcohol use Yes     Comment: occassionally  . Drug use: No  . Sexual activity: Not on file   Other Topics Concern  . Not on file   Social History Narrative   No regular exercise     Family History  Problem Relation Age of Onset  .  Dementia Mother 33  . Heart attack Father 80  . Diabetes Brother 76  . Heart attack Brother   . Colon cancer Neg Hx   . Stomach cancer Neg Hx   . Esophageal cancer Neg Hx   . Rectal cancer Neg Hx     Allergies  Allergen Reactions  . Clindamycin/Lincomycin Rash  . Dye Fdc Red [Red Dye] Rash  . Lovenox [Enoxaparin Sodium]     Rash, blackness at injection site. Multifactorial and multiple other drugs used at the same time.  [name] Kitchen Penicillins Rash    Medication list reviewed and updated in full in [place].   GEN: No acute illnesses, no fevers, chills. GI: No n/v/d, eating normally Pulm: SOB with exertion Interactive and getting along well at home.  Otherwise, ROS is as per the HPI.  Objective:   BP 130/80   Pulse 72   Temp 98.3 F (36.8 C) (Oral)   Ht 5\' 8"  (1.727 m)   Wt 236 lb 4 oz (107.2 kg)   BMI 35.92 kg/m   GEN: WDWN, NAD, Non-toxic, A & O x 3 HEENT: Atraumatic, Normocephalic. Neck supple. No masses, No LAD. Ears and Nose: No external deformity. CV: RRR, No M/G/R. No JVD. No thrill. No extra heart sounds. PULM: CTA B, no wheezes, crackles, rhonchi. No retractions. No resp. distress. No accessory muscle use. EXTR: No c/c/e NEURO Normal gait.  PSYCH: Normally interactive. Conversant. Not depressed or anxious appearing.  Calm demeanor.   Laboratory and Imaging Data:  Assessment and Plan:   Dyspnea on exertion - Plan: Ambulatory referral to Cardiology, EKG 12-Lead  Coronary artery disease involving native coronary artery of native heart without angina pectoris - Plan: Ambulatory referral to Cardiology  History of MI (myocardial infarction) - Plan: Ambulatory referral to Cardiology  DM (diabetes mellitus) type II controlled peripheral vascular disorder ([place]) - Plan: Basic metabolic panel, Hemoglobin A1c  Essential hypertension  Hyperlipidemia LDL goal <70 - Plan: Lipid panel  Other fatigue - Plan: Vitamin B12, CBC with Differential/Platelet, Hepatic  function panel, TSH, VITAMIN D 25 Hydroxy (Vit-D Deficiency, Fractures)  Myalgia and myositis - Plan: CK  Need for prophylactic vaccination and inoculation against influenza - Plan: Flu Vaccine QUAD 36+ mos IM  EKG: Normal sinus rhythm. Normal axis, normal R wave progression, No acute ST elevation or depression.   DOE in a patient with known CAD - consult Cardiology. EKG reassuring and no active chest pain.  Fatigue, muscle aches - check labs today. F/u dm labs  Follow-up: No Follow-up on file.  Orders Placed This Encounter  Procedures  . Flu Vaccine QUAD 36+  mos IM  . Vitamin B12  . Basic metabolic panel  . CBC with Differential/Platelet  . Hepatic function panel  . CK  . Lipid panel  . Hemoglobin A1c  . TSH  . VITAMIN D 25 Hydroxy (Vit-D Deficiency, Fractures)  . Ambulatory referral to Cardiology  . EKG 12-Lead    Signed,  Starlynn Klinkner T. Larrie Fraizer, MD   Patient's Medications  New Prescriptions   No medications on file  Previous Medications   AMLODIPINE (NORVASC) 5 MG TABLET    Take 1 tablet (5 mg total) by mouth daily.   ASPIRIN 81 MG TABLET    Take 81 mg by mouth daily.   ATORVASTATIN (LIPITOR) 40 MG TABLET    Take 1 tablet (40 mg total) by mouth daily.   CITALOPRAM (CELEXA) 20 MG TABLET    Take 1 tablet (20 mg total) by mouth daily.   LISINOPRIL (PRINIVIL,ZESTRIL) 10 MG TABLET    Take 1 tablet (10 mg total) by mouth daily.   METFORMIN (GLUCOPHAGE-XR) 500 MG 24 HR TABLET    Take 1 tablet (500 mg total) by mouth daily.  Modified Medications   No medications on file  Discontinued Medications   No medications on file

## 2016-08-26 ENCOUNTER — Ambulatory Visit (INDEPENDENT_AMBULATORY_CARE_PROVIDER_SITE_OTHER): Payer: PPO | Admitting: Cardiology

## 2016-08-26 ENCOUNTER — Encounter: Payer: Self-pay | Admitting: Cardiology

## 2016-08-26 VITALS — BP 142/72 | HR 72 | Ht 68.0 in | Wt 236.5 lb

## 2016-08-26 DIAGNOSIS — I10 Essential (primary) hypertension: Secondary | ICD-10-CM | POA: Diagnosis not present

## 2016-08-26 DIAGNOSIS — R0602 Shortness of breath: Secondary | ICD-10-CM

## 2016-08-26 DIAGNOSIS — I2510 Atherosclerotic heart disease of native coronary artery without angina pectoris: Secondary | ICD-10-CM | POA: Diagnosis not present
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
## 2016-08-26 NOTE — Patient Instructions (Addendum)
Testing/Procedures: Your physician has requested that you have an echocardiogram. Echocardiography is a painless test that uses sound waves to create images of your heart. It provides your doctor with information about the size and shape of your heart and how well your heart's chambers and valves are working. This procedure takes approximately one hour. There are no restrictions for this procedure.  [place]  Your caregiver has ordered a Stress Test with nuclear imaging. The purpose of this test is to evaluate the blood supply to your heart muscle. This procedure is referred to as a "Non-Invasive Stress Test." This is because other than having an IV started in your vein, nothing is inserted or "invades" your body. Cardiac stress tests are done to find areas of poor blood flow to the heart by determining the extent of coronary artery disease (CAD).    Please note: these test may take anywhere between 2-4 hours to complete  PLEASE REPORT TO [place] AT THE FIRST DESK WILL DIRECT YOU WHERE TO GO  Date of Procedure:__Wednesday September 09, 2016 at 07:30AM___  Arrival Time for Procedure:_Arrive at 07:15AM to register___  Instructions regarding medication:   __X__ : Hold diabetes medication Metformin (Glucophage) the morning of procedure   PLEASE NOTIFY THE OFFICE AT LEAST 24 HOURS IN ADVANCE IF YOU ARE UNABLE TO KEEP YOUR APPOINTMENT.  [phone] AND  PLEASE NOTIFY NUCLEAR MEDICINE AT [facility] AT LEAST 24 HOURS IN ADVANCE IF YOU ARE UNABLE TO KEEP YOUR APPOINTMENT. [phone]  How to prepare for your Myoview test:  1. Do not eat or drink after midnight 2. No caffeine for 24 hours prior to test 3. No smoking 24 hours prior to test. 4. Your medication may be taken with water.  If your doctor stopped a medication because of this test, do not take that medication. 5. Ladies, please do not wear dresses.  Skirts or pants are appropriate. Please wear a short  sleeve shirt. 6. No perfume, cologne or lotion. 7. Wear comfortable walking shoes. No heels!     Follow-Up: Your physician recommends that you schedule a follow-up appointment after testing with Dr. Yvone Neu.  It was a pleasure seeing you today here in the office. Please do not hesitate to give [place] a call back if you have any further questions. [place], BSN    Echocardiogram An echocardiogram, or echocardiography, uses sound waves (ultrasound) to produce an image of your heart. The echocardiogram is simple, painless, obtained within a short period of time, and offers valuable information to your health care provider. The images from an echocardiogram can provide information such as:  Evidence of coronary artery disease (CAD).  Heart size.  Heart muscle function.  Heart valve function.  Aneurysm detection.  Evidence of a past heart attack.  Fluid buildup around the heart.  Heart muscle thickening.  Assess heart valve function. LET [facility] CARE PROVIDER KNOW ABOUT:  Any allergies you have.  All medicines you are taking, including vitamins, herbs, eye drops, creams, and over-the-counter medicines.  Previous problems you or members of your family have had with the use of anesthetics.  Any blood disorders you have.  Previous surgeries you have had.  Medical conditions you have.  Possibility of pregnancy, if this applies. BEFORE THE PROCEDURE  No special preparation is needed. Eat and drink normally.  PROCEDURE   In order to produce an image of your heart, gel will be applied to your chest and a wand-like  tool (transducer) will be moved over your chest. The gel will help transmit the sound waves from the transducer. The sound waves will harmlessly bounce off your heart to allow the heart images to be captured in real-time motion. These images will then be recorded.  You may need an IV to receive a medicine that improves the quality of the  pictures. AFTER THE PROCEDURE You may return to your normal schedule including diet, activities, and medicines, unless your health care provider tells you otherwise.   This information is not intended to replace advice given to you by your health care provider. Make sure you discuss any questions you have with your health care provider.   Document Released: 11/13/2000 Document Revised: 12/07/2014 Document Reviewed: 07/24/2013 Elsevier Interactive Patient Education 2016 Elsevier Inc.   Cardiac Nuclear Scanning A cardiac nuclear scan is used to check your heart for problems, such as the following:  A portion of the heart is not getting enough blood.  Part of the heart muscle has died, which happens with a heart attack.  The heart wall is not working normally.  In this test, a radioactive dye (tracer) is injected into your bloodstream. After the tracer has traveled to your heart, a scanning device is used to measure how much of the tracer is absorbed by or distributed to various areas of your heart. [facility] PROVIDER KNOW ABOUT:  Any allergies you have.  All medicines you are taking, including vitamins, herbs, eye drops, creams, and over-the-counter medicines.  Previous problems you or members of your family have had with the use of anesthetics.  Any blood disorders you have.  Previous surgeries you have had.  Medical conditions you have.  RISKS AND COMPLICATIONS Generally, this is a safe procedure. However, as with any procedure, problems can occur. Possible problems include:   Serious chest pain.  Rapid heartbeat.  Sensation of warmth in your chest. This usually passes quickly. BEFORE THE PROCEDURE Ask your health care provider about changing or stopping your regular medicines. PROCEDURE This procedure is usually done at a hospital and takes 2-4 hours.  An IV tube is inserted into one of your veins.  Your health care provider will inject a small amount of  radioactive tracer through the tube.  You will then wait for 20-40 minutes while the tracer travels through your bloodstream.  You will lie down on an exam table so images of your heart can be taken. Images will be taken for about 15-20 minutes.  You will exercise on a treadmill or stationary bike. While you exercise, your heart activity will be monitored with an electrocardiogram (ECG), and your blood pressure will be checked.  If you are unable to exercise, you may be given a medicine to make your heart beat faster.  When blood flow to your heart has peaked, tracer will again be injected through the IV tube.  After 20-40 minutes, you will get back on the exam table and have more images taken of your heart.  When the procedure is over, your IV tube will be removed. AFTER THE PROCEDURE  You will likely be able to leave shortly after the test. Unless your health care provider tells you otherwise, you may return to your normal schedule, including diet, activities, and medicines.  Make sure you find out how and when you will get your test results.   This information is not intended to replace advice given to you by your health care provider. Make sure you discuss  any questions you have with your health care provider.   Document Released: 12/11/2004 Document Revised: 11/21/2013 Document Reviewed: 10/25/2013 Elsevier Interactive Patient Education [employer].

## 2016-09-09 ENCOUNTER — Encounter
Admission: RE | Admit: 2016-09-09 | Discharge: 2016-09-09 | Disposition: A | Discharge status: Home / Self Care | Payer: PPO | Source: Ambulatory Visit | Attending: Cardiology | Admitting: Cardiology

## 2016-09-09 DIAGNOSIS — I2510 Atherosclerotic heart disease of native coronary artery without angina pectoris: Secondary | ICD-10-CM | POA: Diagnosis not present

## 2016-09-09 DIAGNOSIS — I10 Essential (primary) hypertension: Secondary | ICD-10-CM | POA: Insufficient documentation

## 2016-09-09 LAB — NM MYOCAR MULTI W/SPECT W/WALL MOTION / EF
Estimated workload: 7 METS
Exercise duration (min): 6 min
Exercise duration (sec): 1 s
LV dias vol: 52 mL (ref 62–150)
LV sys vol: 20 mL
Peak HR: 133 {beats}/min
Percent HR: 88 %
Rest HR: 68 {beats}/min
SDS: 0
SRS: 2
SSS: 0
TID: 0.72

## 2016-09-09 MED ORDER — TECHNETIUM TC 99M TETROFOSMIN IV KIT
13.0000 | PACK | Freq: Once | INTRAVENOUS | Status: AC | PRN
  Administered 2016-09-09: 12.45 via INTRAVENOUS

## 2016-09-09 MED ORDER — TECHNETIUM TC 99M TETROFOSMIN IV KIT
32.4250 | PACK | Freq: Once | INTRAVENOUS | Status: AC | PRN
  Administered 2016-09-09: 32.425 via INTRAVENOUS

## 2016-09-10 ENCOUNTER — Ambulatory Visit (INDEPENDENT_AMBULATORY_CARE_PROVIDER_SITE_OTHER): Payer: PPO

## 2016-09-10 ENCOUNTER — Other Ambulatory Visit: Payer: Self-pay

## 2016-09-10 DIAGNOSIS — I2510 Atherosclerotic heart disease of native coronary artery without angina pectoris: Secondary | ICD-10-CM

## 2016-09-10 DIAGNOSIS — I10 Essential (primary) hypertension: Secondary | ICD-10-CM | POA: Diagnosis not present

## 2016-09-10 NOTE — Progress Notes (Unsigned)
[name]

## 2016-09-17 ENCOUNTER — Ambulatory Visit (INDEPENDENT_AMBULATORY_CARE_PROVIDER_SITE_OTHER): Payer: PPO | Admitting: Cardiology

## 2016-09-17 ENCOUNTER — Encounter: Payer: Self-pay | Admitting: Cardiology

## 2016-09-17 VITALS — BP 156/78 | HR 75 | Ht 68.0 in | Wt 235.5 lb

## 2016-09-17 DIAGNOSIS — R0602 Shortness of breath: Secondary | ICD-10-CM

## 2016-09-17 DIAGNOSIS — I10 Essential (primary) hypertension: Secondary | ICD-10-CM | POA: Diagnosis not present

## 2016-09-17 DIAGNOSIS — I2510 Atherosclerotic heart disease of native coronary artery without angina pectoris: Secondary | ICD-10-CM

## 2016-09-17 DIAGNOSIS — E785 Hyperlipidemia, unspecified: Secondary | ICD-10-CM | POA: Diagnosis not present

## 2016-09-17 MED ORDER — AMLODIPINE BESYLATE 2.5 MG PO TABS: 7.5000 mg | ORAL_TABLET | Freq: Every day | ORAL | 6 refills | Status: DC

## 2016-09-17 NOTE — Patient Instructions (Signed)
Medication Instructions:  Your physician has recommended you make the following change in your medication:  1. You can take Amlodipine 5 mg 1 & 1/2 tablet daily until your current bottle is empty. 2. THEN START Amlodipine 2.5 mg Three tablets daily  Follow-Up: Your physician recommends that you schedule a follow-up appointment in: 4-6 weeks with Dr. Yvone Neu.   It was a pleasure seeing you today here in the office. Please do not hesitate to give [place] a call back if you have any further questions. [place], BSN

## 2016-09-17 NOTE — Progress Notes (Signed)
Cardiology Office Note   Date:  09/17/2016   ID:  Rajon Neitzke Hosking, DOB 09/13/1946, MRN [id number]  Referring Doctor:  [name], MD   Cardiologist:   Wende Bushy, MD   Reason for consultation:  Chief Complaint  Patient presents with  . other    F/u echo and myoview. Meds reviewed verbally with pt.      History of Present Illness: [name] is a 70 y.o. male who presents for Follow-up after testing.  Patient has a history of CAD status post MI in the 1990s treated with thrombolytics, and transferred to [facility]. Per medical records, there was no obstructive disease seen on a heart catheterization. He last saw Dr. Cindee Lame in 2013.  He is referred again to our office by his PCP for complaints of shortness of breath. this has been going on for several months. This is mainly occurring when he lifts anything heavy. He works in Architect and builds houses. He lifts 40-50 pound bricks and blocks and would. Most of the time, when he does this, he developed the shortness of breath. He denies chest pain or chest tightness. He also reports development of shortness of breath that is related to anything stressful. The shortness of breath is moderate intensity, lasting a few minutes, and eventually resolving.  Since last visit, he has remained physically active. He remembers one episode of significant shortness of breath while lifting moving sheets there are use in [employer]. He denied chest pain at that time. He really has not had any chest pain recently.  Patient denies PND, orthopnea, edema. No abdominal pain. No bleeding.   ROS:  Please see the history of present illness. Aside from mentioned under HPI, all other systems are reviewed and negative.     Past Medical History:  Diagnosis Date  . Aortic transection 2005   Traumatic after a fall from a second floor. s/p repair at [facility]  . Basal cell carcinoma   . CAD (coronary artery disease)   . Depression   . Diabetes mellitus      Type II  . Erectile dysfunction   . Fall 2005   fell off house: torn aorta, clavicle fracture, rib fracture, vertebral fractures, lung contusion, coma x 2 weeks  . Fracture of femoral neck, left ([place]) 08/09/2014   ORIF, Dr. Marry Guan  . Fracture of radial neck, left, closed 08/09/2014  . Gallstone pancreatitis   . History of ATN 08/09/2014   [facility], 2 days of dialysis (ARF)  . History of bleeding ulcers   . Hyperlipidemia    Statin with joint pain   . Hypertension   . MI (myocardial infarction) 1992   Acute anterior MI, thrombolytic therapy  . Peripheral vascular disease [facility])    Atherosclerotic:R renal artery stenosis    Past Surgical History:  Procedure Laterality Date  . CARDIAC CATHETERIZATION  08/1991   50 % mid-Lad stenosis with clot, 25-505 second marginal  . CARDIOVASCULAR SURGERY     with ruptured Aorta, Dr. Camila Li, [facility]   . CHOLECYSTECTOMY    . KNEE SURGERY     Right   . ORIF HIP FRACTURE Left 08/12/2014   Dr. Marry Guan  . THORACOTOMY     thoracic aorta repair   . TRACHEOSTOMY     s/p reversal     reports that he quit smoking about 25 years ago. His smoking use included Cigarettes. He has a 30.00 pack-year smoking history. He has never used smokeless tobacco. He reports that  he drinks alcohol. He reports that he does not use drugs.   family history includes Dementia (age of onset: 42) in his mother; Diabetes (age of onset: 53) in his brother; Heart attack in his brother; Heart attack (age of onset: 39) in his father.   Outpatient Medications Prior to Visit  Medication Sig Dispense Refill  . aspirin 81 MG tablet Take 81 mg by mouth daily.    [name] Kitchen atorvastatin (LIPITOR) 40 MG tablet Take 1 tablet (40 mg total) by mouth daily. 90 tablet 3  . citalopram (CELEXA) 20 MG tablet Take 1 tablet (20 mg total) by mouth daily. 90 tablet 3  . lisinopril (PRINIVIL,ZESTRIL) 10 MG tablet Take 1 tablet (10 mg total) by mouth daily. 90 tablet 3  . metFORMIN (GLUCOPHAGE-XR) 500 MG 24 hr  tablet Take 1 tablet (500 mg total) by mouth daily. 90 tablet 3  . amLODipine (NORVASC) 5 MG tablet Take 1 tablet (5 mg total) by mouth daily. 90 tablet 3   No facility-administered medications prior to visit.      Allergies: Clindamycin/lincomycin; Dye fdc red [red dye]; Lovenox [enoxaparin sodium]; and Penicillins    PHYSICAL EXAM: VS:  BP (!) 156/78 (BP Location: Left Arm, Patient Position: Sitting, Cuff Size: Normal)   Pulse 75   Ht 5\' 8"  (1.727 m)   Wt 235 lb 8 oz (106.8 kg)   BMI 35.81 kg/m  , Body mass index is 35.81 kg/m. Wt Readings from Last 3 Encounters:  09/17/16 235 lb 8 oz (106.8 kg)  08/26/16 236 lb 8 oz (107.3 kg)  08/24/16 236 lb 4 oz (107.2 kg)    GENERAL:  well developed, well nourished, obese, not in acute distress HEENT: normocephalic, pink conjunctivae, anicteric sclerae, no xanthelasma, normal dentition, oropharynx clear NECK:  no neck vein engorgement, JVP normal, no hepatojugular reflux, carotid upstroke brisk and symmetric, no bruit, no thyromegaly, no lymphadenopathy LUNGS:  good respiratory effort, clear to auscultation bilaterally CV:  PMI not displaced, no thrills, no lifts, S1 and S2 within normal limits, no palpable S3 or S4, no murmurs, no rubs, no gallops ABD:  Soft, nontender, nondistended, normoactive bowel sounds, no abdominal aortic bruit, no hepatomegaly, no splenomegaly MS: nontender back, no kyphosis, no scoliosis, no joint deformities EXT:  2+ DP/PT pulses, no edema, no varicosities, no cyanosis, no clubbing SKIN: warm, nondiaphoretic, normal turgor, no ulcers NEUROPSYCH: alert, oriented to person, place, and time, sensory/motor grossly intact, normal mood, appropriate affect  Recent Labs: 08/24/2016: ALT 26; BUN 16; Creatinine, Ser 1.02; Hemoglobin 15.5; Platelets 199.0; Potassium 5.0; Sodium 139; TSH 1.22   Lipid Panel    Component Value Date/Time   CHOL 159 08/24/2016 0922   CHOL 120 08/10/2014 2128   TRIG 147.0 08/24/2016 0922    TRIG 164 08/10/2014 2128   HDL 43.90 08/24/2016 0922   HDL 29 (L) 08/10/2014 2128   CHOLHDL 4 08/24/2016 0922   VLDL 29.4 08/24/2016 0922   VLDL 33 08/10/2014 2128   LDLCALC 85 08/24/2016 0922   LDLCALC 58 08/10/2014 2128   LDLDIRECT 173.5 05/23/2012 1215     Other studies Reviewed:  EKG:  The ekg from EKG from 08/26/2016 was personally reviewed by me and it revealed sinus rhythm, first-degree AV block. Ventricular rate 71 BPM. PR interval 210 ms.  Additional studies/ records that were reviewed personally reviewed by me today include:  Echo 06/20/2012: Left ventricle: The cavity size was normal. There was mild concentric hypertrophy. Systolic function was normal. The estimated ejection fraction  was in the range of 55% to 65%. Wall motion was normal; there were no regional wall motion abnormalities. Doppler parameters are consistent with abnormal left ventricular relaxation (grade 1 diastolic Dysfunction).  Nuclear stress test 06/07/2012: IMPRESSION:   1.     Fair exercise tolerance with no arrhythmia or ischemia.  2.     LV function normal.  3.     No evidence of infarction or ischemia.   Echo 09/10/2016: Left ventricle: The cavity size was normal. Wall thickness was   normal. Systolic function was normal. The estimated ejection   fraction was in the range of 55% to 60%. Wall motion was normal;   there were no regional wall motion abnormalities. Doppler   parameters are consistent with abnormal left ventricular   relaxation (grade 1 diastolic dysfunction). - Left atrium: The atrium was mildly dilated. - Pulmonary arteries: Systolic pressure was within the normal   range.  Exercise nuclear stress test 09/09/2016: Exercise myocardial perfusion imaging study with no significant  ischemia Normal wall motion, EF estimated at 57% No EKG changes concerning for ischemia at peak stress or in recovery. Target heart rate achieved Hypertensive with exercise Low risk  scan   ASSESSMENT AND PLAN: CAD status post MI, 1992 treated with thrombolytics at [facility] Per medical records, cardiac catheterization at that time showed no obstructive disease Shortness of breath  Findings of stress test and echo discussed with patient. LVEF normal. No evidence of ischemia on stress test. Likelihood of clinically significant CAD is low. We discussed that working on blood pressure control may help with his shortness of breath. At the same time, I agree with the patient regarding following up with PCP and determining need for pulmonary workup. In the meantime, continue medical therapy: Aspirin, statin therapy, ACE inhibitor. Hold off on beta blocker due to first-degree AV block.  First-degree AV block  Hypertension We will increase his amlodipine to a total of 7.5 mg once a day. Continue monitoring BP. Blood pressure log to be brought Visit. Continue current medical therapy and lifestyle changes.  Hyperlipidemia LDL goal less than 70. Patient on statin therapy. PCP managing labs.  Current medicines are reviewed at length with the patient today.  The patient does not have concerns regarding medicines.  Labs/ tests ordered today include:  No orders of the defined types were placed in this encounter.   I had a lengthy and detailed discussion with the patient regarding diagnoses, prognosis, diagnostic options, treatment options , and side effects of medications.   I counseled the patient on importance of lifestyle modification including heart healthy diet, regular physical activity .   Disposition:   FU with undersigned in 4-6 weeks.  Signed, Wende Bushy, MD  09/17/2016 12:21 PM    [place]  This note was generated in part with voice recognition software and I apologize for any typographical errors that were not detected and corrected.

## 2016-10-13 ENCOUNTER — Other Ambulatory Visit: Payer: Self-pay | Admitting: Family Medicine

## 2016-10-14 ENCOUNTER — Ambulatory Visit (INDEPENDENT_AMBULATORY_CARE_PROVIDER_SITE_OTHER): Payer: PPO | Admitting: Pulmonary Disease

## 2016-10-14 ENCOUNTER — Ambulatory Visit (INDEPENDENT_AMBULATORY_CARE_PROVIDER_SITE_OTHER)
Admission: RE | Admit: 2016-10-14 | Discharge: 2016-10-14 | Disposition: A | Discharge status: Home / Self Care | Payer: PPO | Source: Ambulatory Visit | Attending: Pulmonary Disease | Admitting: Pulmonary Disease

## 2016-10-14 ENCOUNTER — Other Ambulatory Visit: Payer: Self-pay | Admitting: Pulmonary Disease

## 2016-10-14 ENCOUNTER — Encounter: Payer: Self-pay | Admitting: Pulmonary Disease

## 2016-10-14 ENCOUNTER — Other Ambulatory Visit (INDEPENDENT_AMBULATORY_CARE_PROVIDER_SITE_OTHER): Payer: PPO

## 2016-10-14 VITALS — BP 136/76 | HR 71 | Ht 68.0 in | Wt 235.0 lb

## 2016-10-14 DIAGNOSIS — R0602 Shortness of breath: Secondary | ICD-10-CM

## 2016-10-14 DIAGNOSIS — R0600 Dyspnea, unspecified: Secondary | ICD-10-CM

## 2016-10-14 LAB — CBC WITH DIFFERENTIAL/PLATELET
Basophils Absolute: 0 10*3/uL (ref 0.0–0.1)
Basophils Relative: 0.7 % (ref 0.0–3.0)
Eosinophils Absolute: 0.2 10*3/uL (ref 0.0–0.7)
Eosinophils Relative: 2.9 % (ref 0.0–5.0)
HCT: 44.6 % (ref 39.0–52.0)
Hemoglobin: 15.3 g/dL (ref 13.0–17.0)
Lymphocytes Relative: 22.4 % (ref 12.0–46.0)
Lymphs Abs: 1.4 10*3/uL (ref 0.7–4.0)
MCHC: 34.3 g/dL (ref 30.0–36.0)
MCV: 88.3 fl (ref 78.0–100.0)
Monocytes Absolute: 0.5 10*3/uL (ref 0.1–1.0)
Monocytes Relative: 8.3 % (ref 3.0–12.0)
Neutro Abs: 4.1 10*3/uL (ref 1.4–7.7)
Neutrophils Relative %: 65.7 % (ref 43.0–77.0)
Platelets: 238 10*3/uL (ref 150.0–400.0)
RBC: 5.05 Mil/uL (ref 4.22–5.81)
RDW: 13.3 % (ref 11.5–15.5)
WBC: 6.2 10*3/uL (ref 4.0–10.5)

## 2016-10-14 NOTE — Assessment & Plan Note (Signed)
[name] is back to see me again 2 years after her last visit once again for an assessment of dyspnea. Back in 2015 his dyspnea resolved with 35 pounds of weight loss and exercise. However, though his weight is back up by about 20 pounds he said a fairly abrupt onset of significant dyspnea over the last 2 months. He's had a good cardiology evaluation which is been unrevealing.  I worry about the possibility of an underlying interstitial lung disease or perhaps a culmination of subtle airflow obstruction or emphysema in combination with deconditioning and obesity causing symptoms. At this time it's not really clear.  Plan: Ambulatory oximetry monitoring Repeat CBC Chest x-ray Pulmonic function testing Consider CT chest depending on result of his lung function testing Follow-up 2 weeks

## 2016-10-14 NOTE — Progress Notes (Signed)
Subjective:    Patient ID: [name], male    DOB: 01-26-1946, 70 y.o.   MRN: [id number]  Synopsis: First referred in 2015 for dyspnea.   HPI Chief Complaint  Patient presents with  . Follow-up    last seen 08/2014- c/o worsening SOB X2 months.  Also notes chonic sinus congestion.      [name] is accompanied by his son today who has noticed dyspnea. > the dyspnea has progressed relatively quickly over the last two months > any minimal activity will make him dyspneic > just walking up a slight incline will make him dyspneic > his son notes that he mouth breathes a lot > it takes him about 2-3 minutes to catch his breath > he had a cough this spring/summer which was dry and since then he is dyspneic > he saw cardiology about this and was told that his stress test and echo were normal > no chest pain > no leg swelling > no dyspnea at rest or lying flat > no clear environmental triggers > no changes in home > he only feels dyspneic, no chest pressure or wheezing or rattling > he also notes feeling tired and he feels like his muscles are weaker than before, but his son feels like his strength is there > he hasn't had done any exercise   Past Medical History:  Diagnosis Date  . Aortic transection 2005   Traumatic after a fall from a second floor. s/p repair at [facility]  . Basal cell carcinoma   . CAD (coronary artery disease)   . Depression   . Diabetes mellitus    Type II  . Erectile dysfunction   . Fall 2005   fell off house: torn aorta, clavicle fracture, rib fracture, vertebral fractures, lung contusion, coma x 2 weeks  . Fracture of femoral neck, left ([place]) 08/09/2014   ORIF, Dr. Marry Guan  . Fracture of radial neck, left, closed 08/09/2014  . Gallstone pancreatitis   . History of ATN 08/09/2014   [facility], 2 days of dialysis (ARF)  . History of bleeding ulcers   . Hyperlipidemia    Statin with joint pain   . Hypertension   . MI (myocardial infarction) 1992   Acute anterior  MI, thrombolytic therapy  . Peripheral vascular disease ([place])    Atherosclerotic:R renal artery stenosis      Review of Systems  Constitutional: Negative for chills, fatigue and fever.  HENT: Negative for postnasal drip, rhinorrhea and sinus pain.   Respiratory: Positive for shortness of breath. Negative for chest tightness and wheezing.   Cardiovascular: Negative for chest pain, palpitations and leg swelling.       Objective:   Physical Exam Vitals:   10/14/16 0908  BP: 136/76  Pulse: 71  SpO2: 97%  Weight: 235 lb (106.6 kg)  Height: 5\' 8"  (1.727 m)   Gen: well appearing, no acute distress HENT: NCAT, OP clear, neck supple without masses Eyes: PERRL, EOMi Lymph: no cervical lymphadenopathy PULM: CTA B CV: RRR, no mgr, no JVD GI: BS+, soft, nontender, no hsm Derm: no rash or skin breakdown MSK: normal bulk and tone Neuro: A&Ox4, CN II-XII intact, strength 5/5 in all 4 extremities Psyche: normal mood and affect   CBC    Component Value Date/Time   WBC 7.0 08/24/2016 0922   RBC 5.13 08/24/2016 0922   HGB 15.5 08/24/2016 0922   HGB 10.6 (L) 08/23/2014 0619   HCT 45.1 08/24/2016 0922   HCT 31.5 (  L) 08/23/2014 0619   PLT 199.0 08/24/2016 0922   PLT 328 08/23/2014 0619   MCV 88.0 08/24/2016 0922   MCV 92 08/23/2014 0619   MCH 31.0 08/23/2014 0619   MCHC 34.4 08/24/2016 0922   RDW 13.4 08/24/2016 0922   RDW 14.3 08/23/2014 0619   LYMPHSABS 1.5 08/24/2016 0922   LYMPHSABS 1.0 08/23/2014 0619   MONOABS 0.5 08/24/2016 0922   MONOABS 0.5 08/23/2014 0619   EOSABS 0.2 08/24/2016 0922   EOSABS 0.3 08/23/2014 0619   BASOSABS 0.0 08/24/2016 0922   BASOSABS 0.0 08/23/2014 [id number]   Records from cardiology reviewed where his dyspnea was evaluated with an echocardiogram and nuclear stress test Echocardiogram from October 2017 showed normal LVEF and normal estimated PA pressure     Assessment & Plan:  Dyspnea Mr. Alveta Heimlich is back to see me again 2 years after her last visit  once again for an assessment of dyspnea. Back in 2015 his dyspnea resolved with 35 pounds of weight loss and exercise. However, though his weight is back up by about 20 pounds he said a fairly abrupt onset of significant dyspnea over the last 2 months. He's had a good cardiology evaluation which is been unrevealing.  I worry about the possibility of an underlying interstitial lung disease or perhaps a culmination of subtle airflow obstruction or emphysema in combination with deconditioning and obesity causing symptoms. At this time it's not really clear.  Plan: Ambulatory oximetry monitoring Repeat CBC Chest x-ray Pulmonic function testing Consider CT chest depending on result of his lung function testing Follow-up 2 weeks    Current Outpatient Prescriptions:  .  amLODipine (NORVASC) 2.5 MG tablet, Take 3 tablets (7.5 mg total) by mouth daily., Disp: 90 tablet, Rfl: 6 .  aspirin 81 MG tablet, Take 81 mg by mouth daily., Disp: , Rfl:  .  atorvastatin (LIPITOR) 40 MG tablet, Take 1 tablet (40 mg total) by mouth daily., Disp: 90 tablet, Rfl: 3 .  citalopram (CELEXA) 20 MG tablet, Take 1 tablet (20 mg total) by mouth daily., Disp: 90 tablet, Rfl: 3 .  lisinopril (PRINIVIL,ZESTRIL) 10 MG tablet, TAKE ONE TABLET EVERY DAY, Disp: 90 tablet, Rfl: 3 .  metFORMIN (GLUCOPHAGE-XR) 500 MG 24 hr tablet, Take 1 tablet (500 mg total) by mouth daily., Disp: 90 tablet, Rfl: 3

## 2016-10-14 NOTE — Patient Instructions (Signed)
We will arrange for a lung function test We'll call you with the results of today's chest x-ray and CBC Depending on the results of the lung function test we may need to get a CAT scan We will see you back in a couple weeks to go over these results

## 2016-10-21 ENCOUNTER — Other Ambulatory Visit: Payer: Self-pay | Admitting: Family Medicine

## 2016-10-28 ENCOUNTER — Ambulatory Visit: Payer: PPO | Admitting: Cardiology

## 2016-10-29 ENCOUNTER — Ambulatory Visit: Payer: PPO | Attending: Pulmonary Disease

## 2016-10-29 DIAGNOSIS — R0602 Shortness of breath: Secondary | ICD-10-CM | POA: Diagnosis present

## 2016-10-29 MED ORDER — ALBUTEROL SULFATE (2.5 MG/3ML) 0.083% IN NEBU
2.5000 mg | INHALATION_SOLUTION | Freq: Once | RESPIRATORY_TRACT | Status: AC
  Administered 2016-10-29: 2.5 mg via RESPIRATORY_TRACT
  Filled 2016-10-29: qty 3

## 2016-11-09 ENCOUNTER — Encounter: Payer: Self-pay | Admitting: Pulmonary Disease

## 2016-11-09 NOTE — Telephone Encounter (Signed)
BQ  Please advise- please see pt. email below  [name]  to Juanito Doom, MD       11/09/16 11:18 AM  Any information on breathing test I should be concerned with   This encounter is not signed. The conversation may still be ongoing.

## 2016-12-23 ENCOUNTER — Other Ambulatory Visit: Payer: Self-pay | Admitting: Family Medicine

## 2017-01-27 ENCOUNTER — Ambulatory Visit (INDEPENDENT_AMBULATORY_CARE_PROVIDER_SITE_OTHER): Payer: PPO | Admitting: Family Medicine

## 2017-01-27 ENCOUNTER — Encounter: Payer: Self-pay | Admitting: Family Medicine

## 2017-01-27 VITALS — BP 140/70 | HR 71 | Temp 98.4°F | Ht 68.0 in | Wt 234.0 lb

## 2017-01-27 DIAGNOSIS — I10 Essential (primary) hypertension: Secondary | ICD-10-CM

## 2017-01-27 DIAGNOSIS — E785 Hyperlipidemia, unspecified: Secondary | ICD-10-CM

## 2017-01-27 DIAGNOSIS — Z79899 Other long term (current) drug therapy: Secondary | ICD-10-CM | POA: Diagnosis not present

## 2017-01-27 DIAGNOSIS — Z1159 Encounter for screening for other viral diseases: Secondary | ICD-10-CM | POA: Diagnosis not present

## 2017-01-27 DIAGNOSIS — E1151 Type 2 diabetes mellitus with diabetic peripheral angiopathy without gangrene: Secondary | ICD-10-CM | POA: Diagnosis not present

## 2017-01-27 DIAGNOSIS — Z0000 Encounter for general adult medical examination without abnormal findings: Secondary | ICD-10-CM

## 2017-01-27 DIAGNOSIS — N400 Enlarged prostate without lower urinary tract symptoms: Secondary | ICD-10-CM | POA: Diagnosis not present

## 2017-01-27 LAB — CBC WITH DIFFERENTIAL/PLATELET
Basophils Absolute: 0.1 10*3/uL (ref 0.0–0.1)
Basophils Relative: 1.2 % (ref 0.0–3.0)
Eosinophils Absolute: 0.3 10*3/uL (ref 0.0–0.7)
Eosinophils Relative: 3.9 % (ref 0.0–5.0)
HCT: 43.8 % (ref 39.0–52.0)
Hemoglobin: 15.2 g/dL (ref 13.0–17.0)
Lymphocytes Relative: 21.7 % (ref 12.0–46.0)
Lymphs Abs: 1.4 10*3/uL (ref 0.7–4.0)
MCHC: 34.8 g/dL (ref 30.0–36.0)
MCV: 86.5 fl (ref 78.0–100.0)
Monocytes Absolute: 0.5 10*3/uL (ref 0.1–1.0)
Monocytes Relative: 7.1 % (ref 3.0–12.0)
Neutro Abs: 4.3 10*3/uL (ref 1.4–7.7)
Neutrophils Relative %: 66.1 % (ref 43.0–77.0)
Platelets: 197 10*3/uL (ref 150.0–400.0)
RBC: 5.06 Mil/uL (ref 4.22–5.81)
RDW: 13.2 % (ref 11.5–15.5)
WBC: 6.5 10*3/uL (ref 4.0–10.5)

## 2017-01-27 LAB — HEPATIC FUNCTION PANEL
ALT: 22 U/L (ref 0–53)
AST: 16 U/L (ref 0–37)
Albumin: 4.9 g/dL (ref 3.5–5.2)
Alkaline Phosphatase: 78 U/L (ref 39–117)
Bilirubin, Direct: 0.2 mg/dL (ref 0.0–0.3)
Total Bilirubin: 1.1 mg/dL (ref 0.2–1.2)
Total Protein: 7.4 g/dL (ref 6.0–8.3)

## 2017-01-27 LAB — LIPID PANEL
Cholesterol: 139 mg/dL (ref 0–200)
HDL: 43.2 mg/dL
LDL Cholesterol: 73 mg/dL (ref 0–99)
NonHDL: 96.23
Total CHOL/HDL Ratio: 3
Triglycerides: 114 mg/dL (ref 0.0–149.0)
VLDL: 22.8 mg/dL (ref 0.0–40.0)

## 2017-01-27 LAB — BASIC METABOLIC PANEL WITH GFR
BUN: 16 mg/dL (ref 6–23)
CO2: 27 meq/L (ref 19–32)
Calcium: 9.9 mg/dL (ref 8.4–10.5)
Chloride: 106 meq/L (ref 96–112)
Creatinine, Ser: 1.05 mg/dL (ref 0.40–1.50)
GFR: 74.29 mL/min
Glucose, Bld: 126 mg/dL — ABNORMAL HIGH (ref 70–99)
Potassium: 5.1 meq/L (ref 3.5–5.1)
Sodium: 140 meq/L (ref 135–145)

## 2017-01-27 LAB — PSA: PSA: 1.81 ng/mL (ref 0.10–4.00)

## 2017-01-27 LAB — HEMOGLOBIN A1C: Hgb A1c MFr Bld: 6.7 % — ABNORMAL HIGH (ref 4.6–6.5)

## 2017-01-27 NOTE — Progress Notes (Signed)
Dr. Frederico Hamman T. Tiyana Galla, MD, [facility] [place], [address] Phone: [phone] Fax: [phone]  01/27/2017  Patient: [name], MRN: [id number], DOB: 08/02/1946, 71 y.o.  Primary Physician:  [name], MD   Chief Complaint  Patient presents with  . Medicare Wellness   Subjective:   [name] is a 71 y.o. pleasant patient who presents for a medicare wellness examination:  Preventative Health Maintenance Visit:  Health Maintenance Summary Reviewed and updated, unless pt declines services.  Tobacco History Reviewed. Alcohol: No concerns, no excessive use Exercise Habits: Some activity, rec at least 30 mins 5 times a week STD concerns: no risk or activity to increase risk Drug Use: None Encouraged self-testicular check  Breathing -  ? Interstitial fibrosis. Dg Chest 2 View  Result Date: 10/14/2016 CLINICAL DATA:  Two months of shortness of breath. History of previous MI, aortic transsection. EXAM: CHEST  2 VIEW COMPARISON:  PA and lateral chest x-ray dated February 02, 2015 FINDINGS: The lungs are mildly hyperinflated. There is no focal infiltrate. The interstitial markings are coarse but stable. There is no alveolar infiltrate. There is no pleural effusion. The heart and pulmonary vascularity are normal. There is calcification in the wall of the aortic arch. There is multilevel degenerative disc disease of the thoracic spine. IMPRESSION: COPD with mild interstitial fibrotic changes not greatly changed from the previous study. No pneumonia nor CHF. Thoracic aortic atherosclerosis. Electronically Signed   By: David  Martinique M.D.   On: 10/14/2016 11:13   Shortness of breath remains his primary issue and complaint. Cardiac workup was completely normal including a normal stress test.  Ongoing pulmonary investigation.  Health Maintenance  Topic Date Due  . Hepatitis C Screening  05/10/1946  . FOOT EXAM   07/21/2014  . HEMOGLOBIN A1C  02/21/2017  . OPHTHALMOLOGY EXAM  03/19/2017  . COLONOSCOPY  07/20/2022  . TETANUS/TDAP  06/30/2025  . INFLUENZA VACCINE  Addressed  . PNA vac Low Risk Adult  Completed    Immunization History  Administered Date(s) Administered  . Influenza Split 10/04/2012  . Influenza Whole 08/16/2008, 08/01/2014  . Influenza,inj,Quad PF,36+ Mos 10/11/2013, 10/18/2015, 08/24/2016  . Pneumococcal Conjugate-13 11/18/2015  . Pneumococcal Polysaccharide-23 06/13/2012  . Td 07/01/2015    Patient Active Problem List   Diagnosis Date Noted  . Radial neck fracture 09/14/2014  . Fracture of femoral neck, left ([place]) 09/14/2014  . Dyspnea 09/12/2014  . Aortic transection   . Peripheral vascular disease ([place])   . History of MI (myocardial infarction)   . Duodenal ulcer with hemorrhage 04/30/2011  . DM (diabetes mellitus) type II controlled peripheral vascular disorder ([place]) 02/13/2010  . Hyperlipidemia LDL goal <70 08/16/2008  . ERECTILE DYSFUNCTION 08/16/2008  . Major depressive disorder, recurrent, in remission ([place]) 08/16/2008  . Essential hypertension 08/16/2008  . Coronary artery disease involving native coronary artery without angina pectoris 08/16/2008   Past Medical History:  Diagnosis Date  . Aortic transection 2005   Traumatic after a fall from a second floor. s/p repair at [facility]  . Basal cell carcinoma   . CAD (coronary artery disease)   . Depression   . Diabetes mellitus    Type II  . Erectile dysfunction   . Fall 2005   fell off house: torn aorta, clavicle fracture, rib fracture, vertebral fractures, lung contusion, coma x 2 weeks  . Fracture of femoral neck, left ([place]) 08/09/2014   ORIF, Dr. Marry Guan  .  Fracture of radial neck, left, closed 08/09/2014  . Gallstone pancreatitis   . History of ATN 08/09/2014   [facility], 2 days of dialysis (ARF)  . History of bleeding ulcers   . Hyperlipidemia    Statin with joint pain   . Hypertension   . MI (myocardial  infarction) 1992   Acute anterior MI, thrombolytic therapy  . Peripheral vascular disease [facility])    Atherosclerotic:R renal artery stenosis   Past Surgical History:  Procedure Laterality Date  . CARDIAC CATHETERIZATION  08/1991   50 % mid-Lad stenosis with clot, 25-505 second marginal  . CARDIOVASCULAR SURGERY     with ruptured Aorta, Dr. Camila Li, [facility]   . CHOLECYSTECTOMY    . KNEE SURGERY     Right   . ORIF HIP FRACTURE Left 08/12/2014   Dr. Marry Guan  . THORACOTOMY     thoracic aorta repair   . TRACHEOSTOMY     s/p reversal   Social History   Social History  . Marital status: Married    Spouse name: N/A  . Number of children: N/A  . Years of education: N/A   Occupational History  . [employer] in [place] Topics  . Smoking status: Former Smoker    Packs/day: 1.50    Years: 20.00    Types: Cigarettes    Quit date: 09/13/1991  . Smokeless tobacco: Never Used     Comment: quit post MI  . Alcohol use Yes     Comment: occassionally  . Drug use: No  . Sexual activity: Not on file   Other Topics Concern  . Not on file   Social History Narrative   No regular exercise    Family History  Problem Relation Age of Onset  . Dementia Mother 31  . Heart attack Father 38  . Diabetes Brother 68  . Heart attack Brother   . Colon cancer Neg Hx   . Stomach cancer Neg Hx   . Esophageal cancer Neg Hx   . Rectal cancer Neg Hx    Allergies  Allergen Reactions  . Clindamycin/Lincomycin Rash  . Dye Fdc Red [Red Dye] Rash  . Lovenox [Enoxaparin Sodium]     Rash, blackness at injection site. Multifactorial and multiple other drugs used at the same time.  [name] Kitchen Penicillins Rash    Medication list has been reviewed and updated.   General: Denies fever, chills, sweats. No significant weight loss. Eyes: Denies blurring,significant itching ENT: Denies earache, sore throat, and hoarseness. Cardiovascular: Denies chest pains, palpitations, dyspnea  on exertion Respiratory: SOB AND SOB WITH EXERTION Breast: no concerns about lumps GI: Denies nausea, vomiting, diarrhea, constipation, change in bowel habits, abdominal pain, melena, hematochezia GU: Denies penile discharge, ED, urinary flow / outflow problems. No STD concerns. Musculoskeletal: Denies back pain, joint pain Derm: Denies rash, itching Neuro: Denies  paresthesias, frequent falls, frequent headaches Psych: Denies depression, anxiety Endocrine: Denies cold intolerance, heat intolerance, polydipsia Heme: Denies enlarged lymph nodes Allergy: No hayfever  Objective:   BP 140/70   Pulse 71   Temp 98.4 F (36.9 C) (Oral)   Ht 5' 8"  (1.727 m)   Wt 234 lb (106.1 kg)   BMI 35.58 kg/m   The patient completed a fall screen and PHQ-2 and PHQ-9 if necessary, which is documented in the EHR. The CMA/LPN/RN who assisted the patient verbally completed with them and documented results in [place].   Hearing Screening   Method:  Audiometry   125Hz  250Hz  500Hz  1000Hz  2000Hz  3000Hz  4000Hz  6000Hz  8000Hz   Right ear:   20 40 20  0    Left ear:   25 40 20  40    Vision Screening Comments: Wears Glasses.  Eye Exam 02/2016  GEN: well developed, well nourished, no acute distress Eyes: conjunctiva and lids normal, PERRLA, EOMI ENT: TM clear, nares clear, oral exam WNL Neck: supple, no lymphadenopathy, no thyromegaly, no JVD Pulm: clear to auscultation and percussion, respiratory effort normal CV: regular rate and rhythm, S1-S2, no murmur, rub or gallop, no bruits, peripheral pulses normal and symmetric, no cyanosis, clubbing, edema or varicosities GI: soft, non-tender; no hepatosplenomegaly, masses; active bowel sounds all quadrants GU: no hernia, testicular mass, penile discharge Lymph: no cervical, axillary or inguinal adenopathy MSK: gait normal, muscle tone and strength WNL, no joint swelling, effusions, discoloration, crepitus  SKIN: clear, good turgor, color WNL, no  rashes, lesions, or ulcerations Neuro: normal mental status, normal strength, sensation, and motion Psych: alert; oriented to person, place and time, normally interactive and not anxious or depressed in appearance.  All labs reviewed with patient.  Lipids:    Component Value Date/Time   CHOL 139 01/27/2017 0851   CHOL 120 08/10/2014 2128   TRIG 114.0 01/27/2017 0851   TRIG 164 08/10/2014 2128   HDL 43.20 01/27/2017 0851   HDL 29 (L) 08/10/2014 2128   LDLDIRECT 173.5 05/23/2012 1215   VLDL 22.8 01/27/2017 0851   VLDL 33 08/10/2014 2128   CHOLHDL 3 01/27/2017 0851   CBC: CBC Latest Ref Rng & Units 01/27/2017 10/14/2016 08/24/2016  WBC 4.0 - 10.5 K/uL 6.5 6.2 7.0  Hemoglobin 13.0 - 17.0 g/dL 15.2 15.3 15.5  Hematocrit 39.0 - 52.0 % 43.8 44.6 45.1  Platelets 150.0 - 400.0 K/uL 197.0 238.0 381.0    Basic Metabolic Panel:    Component Value Date/Time   NA 140 01/27/2017 0851   NA 140 08/23/2014 0619   K 5.1 01/27/2017 0851   K 4.7 08/23/2014 0619   CL 106 01/27/2017 0851   CL 109 (H) 08/23/2014 0619   CO2 27 01/27/2017 0851   CO2 25 08/23/2014 0619   BUN 16 01/27/2017 0851   BUN 14 08/23/2014 0619   CREATININE 1.05 01/27/2017 0851   CREATININE 0.99 08/23/2014 0619   GLUCOSE 126 (H) 01/27/2017 0851   GLUCOSE 129 (H) 08/23/2014 0619   CALCIUM 9.9 01/27/2017 0851   CALCIUM 8.2 (L) 08/23/2014 0619   Hepatic Function Latest Ref Rng & Units 01/27/2017 08/24/2016 11/11/2015  Total Protein 6.0 - 8.3 g/dL 7.4 7.4 7.0  Albumin 3.5 - 5.2 g/dL 4.9 4.6 4.7  AST 0 - 37 U/L 16 18 16   ALT 0 - 53 U/L 22 26 23   Alk Phosphatase 39 - 117 U/L 78 81 86  Total Bilirubin 0.2 - 1.2 mg/dL 1.1 0.9 1.1  Bilirubin, Direct 0.0 - 0.3 mg/dL 0.2 0.2 0.2    Lab Results  Component Value Date   TSH 1.22 08/24/2016   Lab Results  Component Value Date   PSA 1.81 01/27/2017   PSA 1.64 11/11/2015   PSA 1.14 07/13/2013    Assessment and Plan:   Healthcare maintenance  DM (diabetes mellitus)  type II controlled peripheral vascular disorder ([place]) - Plan: Basic metabolic panel, Hemoglobin A1c  Hyperlipidemia LDL goal <70 - Plan: Lipid panel  Essential hypertension  BPH without urinary obstruction - Plan: PSA  Need for hepatitis C screening test - Plan: Hepatitis C  antibody  Encounter for long-term current use of medication - Plan: CBC with Differential/Platelet, Hepatic function panel  COPD with interstitial fibrosis on x-ray. Ongoing shortness of breath. I'll contacting pulmonology for further assistance.  Health Maintenance Exam: The patient's preventative maintenance and recommended screening tests for an annual wellness exam were reviewed in full today. Brought up to date unless services declined.  Counselled on the importance of diet, exercise, and its role in overall health and mortality. The patient's FH and SH was reviewed, including their home life, tobacco status, and drug and alcohol status.  Follow-up in 1 year for physical exam or additional follow-up below.  I have personally reviewed the Medicare Annual Wellness questionnaire and have noted 1. The patient's medical and social history 2. Their use of alcohol, tobacco or illicit drugs 3. Their current medications and supplements 4. The patient's functional ability including ADL's, fall risks, home safety risks and hearing or visual             impairment. 5. Diet and physical activities 6. Evidence for depression or mood disorders 7. Reviewed Updated provider list, see scanned forms and [facility] Snapshot.   The patients weight, height, BMI and visual acuity have been recorded in the chart I have made referrals, counseling and provided education to the patient based review of the above and I have provided the pt with a written personalized care plan for preventive services.  I have provided the patient with a copy of your personalized plan for preventive services. Instructed to take the time to review along with  their updated medication list.  Follow-up: No Follow-up on file. Or follow-up in 1 year if not noted.  There are no discontinued medications. Orders Placed This Encounter  Procedures  . Hepatitis C antibody  . Basic metabolic panel  . CBC with Differential/Platelet  . Hepatic function panel  . Lipid panel  . Hemoglobin A1c  . PSA    Signed,  Frederico Hamman T. Tyrik Stetzer, MD   Allergies as of 01/27/2017      Reactions   Clindamycin/lincomycin Rash   Dye Fdc Red [red Dye] Rash   Lovenox [enoxaparin Sodium]    Rash, blackness at injection site. Multifactorial and multiple other drugs used at the same time.   Penicillins Rash      Medication List       Accurate as of 01/27/17  5:39 PM. Always use your most recent med list.          amLODipine 2.5 MG tablet Commonly known as:  NORVASC Take 3 tablets (7.5 mg total) by mouth daily.   aspirin 81 MG tablet Take 81 mg by mouth daily.   atorvastatin 40 MG tablet Commonly known as:  LIPITOR TAKE ONE TABLET EVERY DAY   citalopram 20 MG tablet Commonly known as:  CELEXA TAKE ONE TABLET EVERY DAY   lisinopril 10 MG tablet Commonly known as:  PRINIVIL,ZESTRIL TAKE ONE TABLET EVERY DAY   metFORMIN 500 MG 24 hr tablet Commonly known as:  GLUCOPHAGE-XR TAKE ONE TABLET EVERY DAY

## 2017-01-27 NOTE — Progress Notes (Signed)
Pre visit review using our clinic review tool, if applicable. No additional management support is needed unless otherwise documented below in the visit note. 

## 2017-01-28 LAB — HEPATITIS C ANTIBODY: HCV Ab: NEGATIVE

## 2017-01-29 ENCOUNTER — Other Ambulatory Visit: Payer: Self-pay

## 2017-01-29 DIAGNOSIS — R0600 Dyspnea, unspecified: Secondary | ICD-10-CM

## 2017-02-09 ENCOUNTER — Ambulatory Visit: Payer: PPO

## 2017-02-10 ENCOUNTER — Ambulatory Visit: Payer: PPO | Admitting: Pulmonary Disease

## 2017-02-19 ENCOUNTER — Ambulatory Visit
Admission: RE | Admit: 2017-02-19 | Discharge: 2017-02-19 | Disposition: A | Discharge status: Home / Self Care | Payer: PPO | Source: Ambulatory Visit | Attending: Pulmonary Disease | Admitting: Pulmonary Disease

## 2017-02-19 DIAGNOSIS — I2510 Atherosclerotic heart disease of native coronary artery without angina pectoris: Secondary | ICD-10-CM | POA: Diagnosis not present

## 2017-02-19 DIAGNOSIS — R0600 Dyspnea, unspecified: Secondary | ICD-10-CM | POA: Diagnosis present

## 2017-02-19 DIAGNOSIS — J439 Emphysema, unspecified: Secondary | ICD-10-CM | POA: Insufficient documentation

## 2017-02-19 DIAGNOSIS — I700 Atherosclerosis of aorta: Secondary | ICD-10-CM | POA: Insufficient documentation

## 2017-02-19 DIAGNOSIS — J432 Centrilobular emphysema: Secondary | ICD-10-CM | POA: Diagnosis not present

## 2017-02-23 NOTE — Progress Notes (Signed)
Spoke with patient and informed him of results. Pt asked when upcoming appointment was and was told it was April 9th at 3pm. Pt did not have any additional questions. Nothing further is needed.

## 2017-03-08 ENCOUNTER — Encounter: Payer: Self-pay | Admitting: Pulmonary Disease

## 2017-03-08 ENCOUNTER — Ambulatory Visit (INDEPENDENT_AMBULATORY_CARE_PROVIDER_SITE_OTHER): Payer: PPO | Admitting: Pulmonary Disease

## 2017-03-08 DIAGNOSIS — J432 Centrilobular emphysema: Secondary | ICD-10-CM

## 2017-03-08 MED ORDER — UMECLIDINIUM-VILANTEROL 62.5-25 MCG/INH IN AEPB: 1.0000 | INHALATION_SPRAY | Freq: Every day | RESPIRATORY_TRACT | 0 refills | Status: DC

## 2017-03-08 NOTE — Assessment & Plan Note (Signed)
Mr. Penza has an interesting finding of only mild airflow obstruction but moderate to severe upper lobe predominant centrilobular emphysema on his CT chest.  I believe that the CT scan findings are the cause of his dyspnea.  Fortunately, he is not the type of patient with COPD who has recurrent exacerbations as these have a worse prognosis.  I'm hopeful that with the addition of bronchodilator treatment we can see improvement in symptoms.  Plan: Start Anoro, samples given 1 puff daily Follow-up with our nurse practitioner in 2 weeks to see how he's doing on this medication and make adjustments as necessary If no improvement with regular exercise and long-acting bronchodilator treatment consider referral to [facility] for lung volume reduction surgery versus bronchoscopy guided lung volume reduction

## 2017-03-08 NOTE — Patient Instructions (Signed)
Taken Anoro puff daily no matter how you feel Exercise regularly Follow-up with our nurse practitioner in 2 weeks to see how you're doing with the new medicine

## 2017-03-08 NOTE — Progress Notes (Signed)
Subjective:    Patient ID: [name], male    DOB: 1945/12/08, 71 y.o.   MRN: [id number]  Synopsis: First referred in 2015 for dyspnea, found to have mild airflow obstruction but moderate to persistent upper lobe predominant centrilobular emphysema. He smoked for 20 years, quit in 1992 after smoking 2 ppd.  His son worked at the pulmonary rehab facility at [facility] for several years.   HPI Chief Complaint  Patient presents with  . Follow-up    go over CT results    Colton says he has dyspnea when he exerts himself and he has to rest for a few minutes before it gets better.  He says that he is not coughing or has any chest congestion.  No inhaler use.    Past Medical History:  Diagnosis Date  . Aortic transection 2005   Traumatic after a fall from a second floor. s/p repair at [facility]  . Basal cell carcinoma   . CAD (coronary artery disease)   . Depression   . Diabetes mellitus    Type II  . Erectile dysfunction   . Fall 2005   fell off house: torn aorta, clavicle fracture, rib fracture, vertebral fractures, lung contusion, coma x 2 weeks  . Fracture of femoral neck, left ([place]) 08/09/2014   ORIF, Dr. Marry Guan  . Fracture of radial neck, left, closed 08/09/2014  . Gallstone pancreatitis   . History of ATN 08/09/2014   [facility], 2 days of dialysis (ARF)  . History of bleeding ulcers   . Hyperlipidemia    Statin with joint pain   . Hypertension   . MI (myocardial infarction) 1992   Acute anterior MI, thrombolytic therapy  . Peripheral vascular disease ([place])    Atherosclerotic:R renal artery stenosis      Review of Systems  Constitutional: Negative for chills, fatigue and fever.  HENT: Negative for postnasal drip, rhinorrhea and sinus pain.   Respiratory: Positive for shortness of breath. Negative for chest tightness and wheezing.   Cardiovascular: Negative for chest pain, palpitations and leg swelling.       Objective:   Physical Exam Vitals:   03/08/17 1451  BP: 128/70    Pulse: 81  SpO2: 93%  Weight: 233 lb (105.7 kg)  Height: 5\' 8"  (1.727 m)   Gen: well appearing HENT: OP clear, TM's clear, neck supple PULM: Poor air movement, normal percussion CV: RRR, no mgr, trace edema GI: BS+, soft, nontender Derm: no cyanosis or rash Psyche: normal mood and affect    CBC    Component Value Date/Time   WBC 6.5 01/27/2017 0851   RBC 5.06 01/27/2017 0851   HGB 15.2 01/27/2017 0851   HGB 10.6 (L) 08/23/2014 0619   HCT 43.8 01/27/2017 0851   HCT 31.5 (L) 08/23/2014 0619   PLT 197.0 01/27/2017 0851   PLT 328 08/23/2014 0619   MCV 86.5 01/27/2017 0851   MCV 92 08/23/2014 0619   MCH 31.0 08/23/2014 0619   MCHC 34.8 01/27/2017 0851   RDW 13.2 01/27/2017 0851   RDW 14.3 08/23/2014 0619   LYMPHSABS 1.4 01/27/2017 0851   LYMPHSABS 1.0 08/23/2014 0619   MONOABS 0.5 01/27/2017 0851   MONOABS 0.5 08/23/2014 0619   EOSABS 0.3 01/27/2017 0851   EOSABS 0.3 08/23/2014 0619   BASOSABS 0.1 01/27/2017 0851   BASOSABS 0.0 08/23/2014 0619   Imaging: 2017 nuclear stress test unremarkable 01/2017 HRCT> moderate to severe centrilobular emphysema, no ILD, three vessel CAD noted, mild calcification aortic  valve Images independently reviewed 03/08/2017  Echocardiogram  October 2017 normal LVEF and normal estimated PA pressure  PFT: 09/2016 Ratio 64%, FEV1 2.96 L 106% predicted, FVC 4.61 L 113% predicted, total lung capacity 7.55 125% predicted, residual volume 124% predicted, DLCO 24.3 mL/m/mm of mercury 79% predicted      Assessment & Plan:  Centrilobular emphysema [facility]) [name] has an interesting finding of only mild airflow obstruction but moderate to severe upper lobe predominant centrilobular emphysema on his CT chest.  I believe that the CT scan findings are the cause of his dyspnea.  Fortunately, he is not the type of patient with COPD who has recurrent exacerbations as these have a worse prognosis.  I'm hopeful that with the addition of bronchodilator  treatment we can see improvement in symptoms.  Plan: Start Anoro, samples given 1 puff daily Follow-up with our nurse practitioner in 2 weeks to see how he's doing on this medication and make adjustments as necessary If no improvement with regular exercise and long-acting bronchodilator treatment consider referral to [facility] for lung volume reduction surgery versus bronchoscopy guided lung volume reduction    Current Outpatient Prescriptions:  .  aspirin 81 MG tablet, Take 81 mg by mouth daily., Disp: , Rfl:  .  atorvastatin (LIPITOR) 40 MG tablet, TAKE ONE TABLET EVERY DAY, Disp: 90 tablet, Rfl: 0 .  citalopram (CELEXA) 20 MG tablet, TAKE ONE TABLET EVERY DAY, Disp: 90 tablet, Rfl: 0 .  lisinopril (PRINIVIL,ZESTRIL) 10 MG tablet, TAKE ONE TABLET EVERY DAY, Disp: 90 tablet, Rfl: 3 .  metFORMIN (GLUCOPHAGE-XR) 500 MG 24 hr tablet, TAKE ONE TABLET EVERY DAY, Disp: 90 tablet, Rfl: 0 .  amLODipine (NORVASC) 2.5 MG tablet, Take 3 tablets (7.5 mg total) by mouth daily., Disp: 90 tablet, Rfl: 6 .  umeclidinium-vilanterol (ANORO ELLIPTA) 62.5-25 MCG/INH AEPB, Inhale 1 puff into the lungs daily., Disp: 2 each, Rfl: 0

## 2017-03-22 ENCOUNTER — Ambulatory Visit (INDEPENDENT_AMBULATORY_CARE_PROVIDER_SITE_OTHER): Payer: PPO | Admitting: Adult Health

## 2017-03-22 ENCOUNTER — Encounter: Payer: Self-pay | Admitting: Adult Health

## 2017-03-22 DIAGNOSIS — J432 Centrilobular emphysema: Secondary | ICD-10-CM | POA: Diagnosis not present

## 2017-03-22 MED ORDER — TIOTROPIUM BROMIDE-OLODATEROL 2.5-2.5 MCG/ACT IN AERS: 2.0000 | INHALATION_SPRAY | Freq: Every day | RESPIRATORY_TRACT | 0 refills | Status: AC

## 2017-03-22 MED ORDER — TIOTROPIUM BROMIDE-OLODATEROL 2.5-2.5 MCG/ACT IN AERS: 2.0000 | INHALATION_SPRAY | Freq: Every day | RESPIRATORY_TRACT | 5 refills | Status: DC

## 2017-03-22 NOTE — Progress Notes (Signed)
@Patient  ID: [name], male    DOB: May 07, 1946, 71 y.o.   MRN: [id number]  No chief complaint on file.   Referring provider: [name], MD  HPI: 71 yo male Mild COPD w/ moderate to severe Emphysema on CT chest   TEST  Imaging: 2017 nuclear stress test unremarkable 01/2017 HRCT> moderate to severe centrilobular emphysema, no ILD, three vessel CAD noted, mild calcification aortic valve Images independently reviewed 03/08/2017  Echocardiogram  October 2017 normal LVEF and normal estimated PA pressure  PFT: 09/2016 Ratio 64%, FEV1 2.96 L 106% predicted, FVC 4.61 L 113% predicted, total lung capacity 7.55 125% predicted, residual volume 124% predicted, DLCO 24.3 mL/m/mm of mercury 79% predicted   03/22/2017 Follow up : COPD  Patient returns for a two-week follow-up. Patient has mild COPD and was started on ANORO last ov to help with symptom management .  Feels the [facility] helped . Unfortunately his insurance will not cover this . His formulary will cover [place] .  He still works, does light physical labor. Walks 2 miles on flat surface.  Inclines cause some dyspnea.  Denies cough or wheezing .  PVX and Prevnar are utd.      Allergies  Allergen Reactions  . Clindamycin/Lincomycin Rash  . Dye Fdc Red [Red Dye] Rash  . Lovenox [Enoxaparin Sodium]     Rash, blackness at injection site. Multifactorial and multiple other drugs used at the same time.  [name] Kitchen Penicillins Rash    Immunization History  Administered Date(s) Administered  . Influenza Split 10/04/2012  . Influenza Whole 08/16/2008, 08/01/2014  . Influenza,inj,Quad PF,36+ Mos 10/11/2013, 10/18/2015, 08/24/2016  . Pneumococcal Conjugate-13 11/18/2015  . Pneumococcal Polysaccharide-23 06/13/2012  . Td 07/01/2015    Past Medical History:  Diagnosis Date  . Aortic transection 2005   Traumatic after a fall from a second floor. s/p repair at [facility]  . Basal cell carcinoma   . CAD (coronary artery disease)   .  Depression   . Diabetes mellitus    Type II  . Erectile dysfunction   . Fall 2005   fell off house: torn aorta, clavicle fracture, rib fracture, vertebral fractures, lung contusion, coma x 2 weeks  . Fracture of femoral neck, left ([place]) 08/09/2014   ORIF, Dr. Marry Guan  . Fracture of radial neck, left, closed 08/09/2014  . Gallstone pancreatitis   . History of ATN 08/09/2014   [facility], 2 days of dialysis (ARF)  . History of bleeding ulcers   . Hyperlipidemia    Statin with joint pain   . Hypertension   . MI (myocardial infarction) ([place]) 1992   Acute anterior MI, thrombolytic therapy  . Peripheral vascular disease ([place])    Atherosclerotic:R renal artery stenosis    Tobacco History: History  Smoking Status  . Former Smoker  . Packs/day: 1.50  . Years: 20.00  . Types: Cigarettes  . Quit date: 09/13/1991  Smokeless Tobacco  . Never Used    Comment: quit post MI   Counseling given: Not Answered   Outpatient Encounter Prescriptions as of 03/22/2017  Medication Sig  . aspirin 81 MG tablet Take 81 mg by mouth daily.  [name] Kitchen atorvastatin (LIPITOR) 40 MG tablet TAKE ONE TABLET EVERY DAY  . citalopram (CELEXA) 20 MG tablet TAKE ONE TABLET EVERY DAY  . lisinopril (PRINIVIL,ZESTRIL) 10 MG tablet TAKE ONE TABLET EVERY DAY  . metFORMIN (GLUCOPHAGE-XR) 500 MG 24 hr tablet TAKE ONE TABLET EVERY DAY  . umeclidinium-vilanterol (ANORO ELLIPTA) 62.5-25 MCG/INH AEPB Inhale  1 puff into the lungs daily.  [name] Kitchen amLODipine (NORVASC) 2.5 MG tablet Take 3 tablets (7.5 mg total) by mouth daily.   No facility-administered encounter medications on file as of 03/22/2017.      Review of Systems  Constitutional:   No  weight loss, night sweats,  Fevers, chills,  +fatigue, or  lassitude.  HEENT:   No headaches,  Difficulty swallowing,  Tooth/dental problems, or  Sore throat,                No sneezing, itching, ear ache, nasal congestion, post nasal drip,   CV:  No chest pain,  Orthopnea, PND, swelling in lower  extremities, anasarca, dizziness, palpitations, syncope.   GI  No heartburn, indigestion, abdominal pain, nausea, vomiting, diarrhea, change in bowel habits, loss of appetite, bloody stools.   Resp: No shortness of breath with exertion or at rest.  No excess mucus, no productive cough,  No non-productive cough,  No coughing up of blood.  No change in color of mucus.  No wheezing.  No chest wall deformity  Skin: no rash or lesions.  GU: no dysuria, change in color of urine, no urgency or frequency.  No flank pain, no hematuria   MS:  No joint pain or swelling.  No decreased range of motion.  No back pain.    Physical Exam  BP (!) 142/76 (BP Location: Left Arm, Cuff Size: Normal)   Pulse 71   Ht 5\' 8"  (1.727 m)   Wt 234 lb (106.1 kg)   SpO2 94%   BMI 35.58 kg/m   GEN: A/Ox3; pleasant , NAD, elderly    HEENT:  /AT,  EACs-clear, TMs-wnl, NOSE-clear, THROAT-clear, no lesions, no postnasal drip or exudate noted.   NECK:  Supple w/ fair ROM; no JVD; normal carotid impulses w/o bruits; no thyromegaly or nodules palpated; no lymphadenopathy.    RESP  Decreased BS in bases  no accessory muscle use, no dullness to percussion  CARD:  RRR, no m/r/g, no peripheral edema, pulses intact, no cyanosis or clubbing.  GI:   Soft & nt; nml bowel sounds; no organomegaly or masses detected.   Musco: Warm bil, no deformities or joint swelling noted.   Neuro: alert, no focal deficits noted.    Skin: Warm, no lesions or rashes    Lab Results:  CBC   BNP No results found for: BNP  ProBNP No results found for: PROBNP  Imaging: No results found.   Assessment & Plan:   No problem-specific Assessment & Plan notes found for this encounter.     Rexene Edison, NP 03/22/2017

## 2017-03-22 NOTE — Addendum Note (Signed)
Addended by: Parke Poisson E on: 03/22/2017 03:50 PM   Modules accepted: Orders

## 2017-03-22 NOTE — Addendum Note (Signed)
Addended by: Parke Poisson E on: 03/22/2017 04:26 PM   Modules accepted: Orders

## 2017-03-22 NOTE — Assessment & Plan Note (Signed)
Improved sx control on LAMA/LABA  Try Stiolto in place of [facility] - insurance coverage  Plan  Patient Instructions  Change ANORO to Stiolto 2 puffs daily .  Continue activity /exercise as tolerated.  Follow up with Dr. Lake Bells in 3-4 months and As needed

## 2017-03-22 NOTE — Patient Instructions (Addendum)
Change ANORO to Stiolto 2 puffs daily .  Continue activity /exercise as tolerated.  Follow up with Dr. Lake Bells in 3-4 months and As needed

## 2017-03-22 NOTE — Progress Notes (Signed)
Patient seen in the office today and instructed on use of Stiolto Respimat.  Patient expressed understanding and demonstrated technique. Parke Poisson, [facility] 03/22/17

## 2017-04-05 NOTE — Progress Notes (Signed)
Reviewed, agree 

## 2017-04-19 ENCOUNTER — Other Ambulatory Visit: Payer: Self-pay | Admitting: Family Medicine

## 2017-04-19 NOTE — Telephone Encounter (Signed)
[name](DPR signed) request refill [facility]/ last annual 01/27/17.advised refilled per protocol and [name] will ck with pharmacy.

## 2017-04-24 ENCOUNTER — Other Ambulatory Visit: Payer: Self-pay | Admitting: Family Medicine

## 2017-04-24 MED ORDER — ATORVASTATIN CALCIUM 40 MG PO TABS: 40.0000 mg | ORAL_TABLET | Freq: Every day | ORAL | 2 refills | Status: DC

## 2017-05-02 ENCOUNTER — Inpatient Hospital Stay
Admission: EM | Admit: 2017-05-02 | Discharge: 2017-05-08 | DRG: 377 | Disposition: A | Discharge status: Home Health | Payer: PPO | Attending: Internal Medicine | Admitting: Internal Medicine

## 2017-05-02 ENCOUNTER — Encounter: Payer: Self-pay | Admitting: Emergency Medicine

## 2017-05-02 ENCOUNTER — Emergency Department: Payer: PPO

## 2017-05-02 DIAGNOSIS — Z85828 Personal history of other malignant neoplasm of skin: Secondary | ICD-10-CM

## 2017-05-02 DIAGNOSIS — R042 Hemoptysis: Secondary | ICD-10-CM | POA: Diagnosis not present

## 2017-05-02 DIAGNOSIS — F329 Major depressive disorder, single episode, unspecified: Secondary | ICD-10-CM | POA: Diagnosis present

## 2017-05-02 DIAGNOSIS — E669 Obesity, unspecified: Secondary | ICD-10-CM | POA: Diagnosis present

## 2017-05-02 DIAGNOSIS — E785 Hyperlipidemia, unspecified: Secondary | ICD-10-CM | POA: Diagnosis present

## 2017-05-02 DIAGNOSIS — I2510 Atherosclerotic heart disease of native coronary artery without angina pectoris: Secondary | ICD-10-CM | POA: Diagnosis present

## 2017-05-02 DIAGNOSIS — K269 Duodenal ulcer, unspecified as acute or chronic, without hemorrhage or perforation: Secondary | ICD-10-CM | POA: Diagnosis present

## 2017-05-02 DIAGNOSIS — E1151 Type 2 diabetes mellitus with diabetic peripheral angiopathy without gangrene: Secondary | ICD-10-CM | POA: Diagnosis present

## 2017-05-02 DIAGNOSIS — J9601 Acute respiratory failure with hypoxia: Secondary | ICD-10-CM | POA: Diagnosis not present

## 2017-05-02 DIAGNOSIS — R1110 Vomiting, unspecified: Secondary | ICD-10-CM | POA: Diagnosis not present

## 2017-05-02 DIAGNOSIS — E875 Hyperkalemia: Secondary | ICD-10-CM | POA: Diagnosis present

## 2017-05-02 DIAGNOSIS — D62 Acute posthemorrhagic anemia: Secondary | ICD-10-CM | POA: Diagnosis present

## 2017-05-02 DIAGNOSIS — E119 Type 2 diabetes mellitus without complications: Secondary | ICD-10-CM | POA: Diagnosis not present

## 2017-05-02 DIAGNOSIS — N179 Acute kidney failure, unspecified: Secondary | ICD-10-CM | POA: Diagnosis not present

## 2017-05-02 DIAGNOSIS — A419 Sepsis, unspecified organism: Secondary | ICD-10-CM | POA: Diagnosis not present

## 2017-05-02 DIAGNOSIS — D649 Anemia, unspecified: Secondary | ICD-10-CM | POA: Diagnosis not present

## 2017-05-02 DIAGNOSIS — K250 Acute gastric ulcer with hemorrhage: Secondary | ICD-10-CM | POA: Diagnosis present

## 2017-05-02 DIAGNOSIS — I739 Peripheral vascular disease, unspecified: Secondary | ICD-10-CM | POA: Diagnosis not present

## 2017-05-02 DIAGNOSIS — K921 Melena: Secondary | ICD-10-CM | POA: Diagnosis not present

## 2017-05-02 DIAGNOSIS — J9621 Acute and chronic respiratory failure with hypoxia: Secondary | ICD-10-CM | POA: Diagnosis not present

## 2017-05-02 DIAGNOSIS — R112 Nausea with vomiting, unspecified: Secondary | ICD-10-CM | POA: Diagnosis not present

## 2017-05-02 DIAGNOSIS — K253 Acute gastric ulcer without hemorrhage or perforation: Secondary | ICD-10-CM

## 2017-05-02 DIAGNOSIS — Z7984 Long term (current) use of oral hypoglycemic drugs: Secondary | ICD-10-CM

## 2017-05-02 DIAGNOSIS — K449 Diaphragmatic hernia without obstruction or gangrene: Secondary | ICD-10-CM | POA: Diagnosis not present

## 2017-05-02 DIAGNOSIS — J209 Acute bronchitis, unspecified: Secondary | ICD-10-CM | POA: Diagnosis not present

## 2017-05-02 DIAGNOSIS — R571 Hypovolemic shock: Secondary | ICD-10-CM | POA: Diagnosis not present

## 2017-05-02 DIAGNOSIS — Z7982 Long term (current) use of aspirin: Secondary | ICD-10-CM | POA: Diagnosis not present

## 2017-05-02 DIAGNOSIS — J441 Chronic obstructive pulmonary disease with (acute) exacerbation: Secondary | ICD-10-CM | POA: Diagnosis not present

## 2017-05-02 DIAGNOSIS — J440 Chronic obstructive pulmonary disease with acute lower respiratory infection: Secondary | ICD-10-CM | POA: Diagnosis not present

## 2017-05-02 DIAGNOSIS — K279 Peptic ulcer, site unspecified, unspecified as acute or chronic, without hemorrhage or perforation: Secondary | ICD-10-CM | POA: Diagnosis not present

## 2017-05-02 DIAGNOSIS — I252 Old myocardial infarction: Secondary | ICD-10-CM

## 2017-05-02 DIAGNOSIS — Z6834 Body mass index (BMI) 34.0-34.9, adult: Secondary | ICD-10-CM

## 2017-05-02 DIAGNOSIS — J9811 Atelectasis: Secondary | ICD-10-CM | POA: Diagnosis not present

## 2017-05-02 DIAGNOSIS — Z87891 Personal history of nicotine dependence: Secondary | ICD-10-CM

## 2017-05-02 DIAGNOSIS — R0602 Shortness of breath: Secondary | ICD-10-CM

## 2017-05-02 DIAGNOSIS — I10 Essential (primary) hypertension: Secondary | ICD-10-CM | POA: Diagnosis present

## 2017-05-02 DIAGNOSIS — Z79899 Other long term (current) drug therapy: Secondary | ICD-10-CM

## 2017-05-02 DIAGNOSIS — R1013 Epigastric pain: Secondary | ICD-10-CM | POA: Diagnosis present

## 2017-05-02 HISTORY — DX: Gastric ulcer, unspecified as acute or chronic, without hemorrhage or perforation: K25.9

## 2017-05-02 LAB — COMPREHENSIVE METABOLIC PANEL WITH GFR
ALT: 22 U/L (ref 17–63)
AST: 21 U/L (ref 15–41)
Albumin: 4.1 g/dL (ref 3.5–5.0)
Alkaline Phosphatase: 61 U/L (ref 38–126)
Anion gap: 7 (ref 5–15)
BUN: 46 mg/dL — ABNORMAL HIGH (ref 6–20)
CO2: 22 mmol/L (ref 22–32)
Calcium: 9 mg/dL (ref 8.9–10.3)
Chloride: 105 mmol/L (ref 101–111)
Creatinine, Ser: 0.97 mg/dL (ref 0.61–1.24)
GFR calc Af Amer: >60 mL/min
GFR calc non Af Amer: >60 mL/min
Glucose, Bld: 137 mg/dL — ABNORMAL HIGH (ref 65–99)
Potassium: 5.2 mmol/L — ABNORMAL HIGH (ref 3.5–5.1)
Sodium: 134 mmol/L — ABNORMAL LOW (ref 135–145)
Total Bilirubin: 0.8 mg/dL (ref 0.3–1.2)
Total Protein: 6.5 g/dL (ref 6.5–8.1)

## 2017-05-02 LAB — CBC
HCT: 31 % — ABNORMAL LOW (ref 40.0–52.0)
Hemoglobin: 11.1 g/dL — ABNORMAL LOW (ref 13.0–18.0)
MCH: 30.7 pg (ref 26.0–34.0)
MCHC: 35.8 g/dL (ref 32.0–36.0)
MCV: 86 fL (ref 80.0–100.0)
Platelets: 225 10*3/uL (ref 150–440)
RBC: 3.61 MIL/uL — ABNORMAL LOW (ref 4.40–5.90)
RDW: 13.3 % (ref 11.5–14.5)
WBC: 7.6 10*3/uL (ref 3.8–10.6)

## 2017-05-02 LAB — URINALYSIS, COMPLETE (UACMP) WITH MICROSCOPIC
Bilirubin Urine: NEGATIVE
Glucose, UA: 50 mg/dL — AB
Hgb urine dipstick: NEGATIVE
Ketones, ur: NEGATIVE mg/dL
Leukocytes, UA: NEGATIVE
Nitrite: NEGATIVE
Protein, ur: NEGATIVE mg/dL
RBC / HPF: NONE SEEN RBC/hpf (ref 0–5)
Specific Gravity, Urine: 1.042 — ABNORMAL HIGH (ref 1.005–1.030)
Squamous Epithelial / HPF: NONE SEEN
pH: 5 (ref 5.0–8.0)

## 2017-05-02 LAB — TROPONIN I: Troponin I: <0.03 ng/mL

## 2017-05-02 LAB — LIPASE, BLOOD: Lipase: 31 U/L (ref 11–51)

## 2017-05-02 MED ORDER — IOPAMIDOL (ISOVUE-300) INJECTION 61%
30.0000 mL | Freq: Once | INTRAVENOUS | Status: AC | PRN
  Administered 2017-05-02: 15 mL via ORAL

## 2017-05-02 MED ORDER — PANTOPRAZOLE SODIUM 40 MG IV SOLR
40.0000 mg | Freq: Two times a day (BID) | INTRAVENOUS | Status: DC
  Administered 2017-05-06 – 2017-05-08 (×5): 40 mg via INTRAVENOUS
  Filled 2017-05-02 (×5): qty 40

## 2017-05-02 MED ORDER — SODIUM CHLORIDE 0.9 % IV SOLN
8.0000 mg/h | INTRAVENOUS | Status: DC
  Administered 2017-05-02 – 2017-05-04 (×4): 8 mg/h via INTRAVENOUS
  Filled 2017-05-02 (×5): qty 80

## 2017-05-02 MED ORDER — SODIUM CHLORIDE 0.9 % IV SOLN
80.0000 mg | Freq: Once | INTRAVENOUS | Status: AC
  Administered 2017-05-02: 80 mg via INTRAVENOUS
  Filled 2017-05-02 (×2): qty 80

## 2017-05-02 MED ORDER — IOPAMIDOL (ISOVUE-300) INJECTION 61%
100.0000 mL | Freq: Once | INTRAVENOUS | Status: AC | PRN
  Administered 2017-05-02: 100 mL via INTRAVENOUS

## 2017-05-02 NOTE — ED Notes (Signed)
Pt vomited post ct scan. Emesis thrown out by ct per pt.

## 2017-05-02 NOTE — ED Notes (Signed)
Pt updated on plan of care. Pt verbalizes understanding.  

## 2017-05-02 NOTE — ED Notes (Signed)
Pt up to commode for bowel movement. Pt has black liquid stool. Dr. Jannifer Franklin notified.

## 2017-05-02 NOTE — ED Notes (Signed)
Pt states bilateral upper quadrant pain with shob, nausea, emesis x1 and "a little black stool" since yesterday. Pt with nontender abd on palpation. Pt states yesterday he became dizzy on standing. Pt denies fever.

## 2017-05-02 NOTE — ED Triage Notes (Signed)
Patient with complaint of generalized abdominal pain, nausea and shortness of breath that started yesterday. Patient states that he has had a decrease in appetite and feeling of fullness. Patient states that he has a history of gastric ulcers.

## 2017-05-02 NOTE — ED Provider Notes (Signed)
[facility] Emergency Department Provider Note   ____________________________________________   I have reviewed the triage vital signs and the nursing notes.   HISTORY  Chief Complaint Abdominal Pain; Shortness of Breath; and Nausea   History limited by: Not Limited   HPI [name] is a 71 y.o. male who presents to the emergency department today because of concerns for abdominal pain. It is located in the upper abdomen. Started yesterday. Patient did have some nausea and vomiting with this pain. Additionally he had noticed some black stool yesterday. Has had associated dizziness. He does admit to frequent alcohol use. There has also been a lot of stress recently. No fevers.   Past Medical History:  Diagnosis Date  . Aortic transection 2005   Traumatic after a fall from a second floor. s/p repair at [facility]  . Basal cell carcinoma   . CAD (coronary artery disease)   . Depression   . Diabetes mellitus    Type II  . Erectile dysfunction   . Fall 2005   fell off house: torn aorta, clavicle fracture, rib fracture, vertebral fractures, lung contusion, coma x 2 weeks  . Fracture of femoral neck, left ([place]) 08/09/2014   ORIF, Dr. Marry Guan  . Fracture of radial neck, left, closed 08/09/2014  . Gallstone pancreatitis   . Gastric ulcer   . History of ATN 08/09/2014   [facility], 2 days of dialysis (ARF)  . History of bleeding ulcers   . Hyperlipidemia    Statin with joint pain   . Hypertension   . MI (myocardial infarction) ([place]) 1992   Acute anterior MI, thrombolytic therapy  . Peripheral vascular disease [facility])    Atherosclerotic:R renal artery stenosis    Patient Active Problem List   Diagnosis Date Noted  . Radial neck fracture 09/14/2014  . Fracture of femoral neck, left ([place]) 09/14/2014  . Centrilobular emphysema ([place]) 09/12/2014  . Aortic transection   . Peripheral vascular disease ([place])   . History of MI (myocardial infarction)   . Duodenal ulcer with  hemorrhage 04/30/2011  . DM (diabetes mellitus) type II controlled peripheral vascular disorder ([place]) 02/13/2010  . Hyperlipidemia LDL goal <70 08/16/2008  . ERECTILE DYSFUNCTION 08/16/2008  . Major depressive disorder, recurrent, in remission ([place]) 08/16/2008  . Essential hypertension 08/16/2008  . Coronary artery disease involving native coronary artery without angina pectoris 08/16/2008    Past Surgical History:  Procedure Laterality Date  . CARDIAC CATHETERIZATION  08/1991   50 % mid-Lad stenosis with clot, 25-505 second marginal  . CARDIOVASCULAR SURGERY     with ruptured Aorta, Dr. Camila Li, [facility]   . CHOLECYSTECTOMY    . KNEE SURGERY     Right   . ORIF HIP FRACTURE Left 08/12/2014   Dr. Marry Guan  . THORACOTOMY     thoracic aorta repair   . TRACHEOSTOMY     s/p reversal    Prior to Admission medications   Medication Sig Start Date End Date Taking? Authorizing Provider  amLODipine (NORVASC) 2.5 MG tablet Take 3 tablets (7.5 mg total) by mouth daily. 09/17/16 12/16/16  Wende Bushy, MD  aspirin 81 MG tablet Take 81 mg by mouth daily.    [provider]  atorvastatin (LIPITOR) 40 MG tablet Take 1 tablet (40 mg total) by mouth daily. 04/24/17   Copland, Frederico Hamman, MD  citalopram (CELEXA) 20 MG tablet TAKE ONE TABLET EVERY DAY 12/23/16   Copland, Frederico Hamman, MD  lisinopril (PRINIVIL,ZESTRIL) 10 MG tablet TAKE ONE TABLET  EVERY DAY 10/14/16   Copland, Frederico Hamman, MD  metFORMIN (GLUCOPHAGE-XR) 500 MG 24 hr tablet TAKE ONE TABLET BY MOUTH EVERY DAY 04/19/17   Copland, Frederico Hamman, MD  Tiotropium Bromide-Olodaterol (STIOLTO RESPIMAT) 2.5-2.5 MCG/ACT AERS Inhale 2 puffs into the lungs daily. 03/22/17   Parrett, Fonnie Mu, NP    Allergies Clindamycin/lincomycin; Dye fdc red [red dye]; Lovenox [enoxaparin sodium]; and Penicillins  Family History  Problem Relation Age of Onset  . Dementia Mother 2  . Heart attack Father 43  . Diabetes Brother 39  . Heart attack Brother   . Colon cancer  Neg Hx   . Stomach cancer Neg Hx   . Esophageal cancer Neg Hx   . Rectal cancer Neg Hx     Social History Social History  Substance Use Topics  . Smoking status: Former Smoker    Packs/day: 1.50    Years: 20.00    Types: Cigarettes    Quit date: 09/13/1991  . Smokeless tobacco: Never Used     Comment: quit post MI  . Alcohol use Yes    Review of Systems Constitutional: No fever/chills Eyes: No visual changes. ENT: No sore throat. Cardiovascular: Denies chest pain. Respiratory: Positive for shortness of breath Gastrointestinal: Positive for abdominal pain. Genitourinary: Negative for dysuria. Musculoskeletal: Negative for back pain. Skin: Negative for rash. Neurological: Negative for headaches, focal weakness or numbness.  ____________________________________________   PHYSICAL EXAM:  VITAL SIGNS: ED Triage Vitals  Enc Vitals Group     BP 05/02/17 1942 (!) 147/67     Pulse Rate 05/02/17 1942 90     Resp 05/02/17 1942 18     Temp 05/02/17 1942 98.3 F (36.8 C)     Temp Source 05/02/17 1942 Oral     SpO2 05/02/17 1942 96 %     Weight 05/02/17 1940 226 lb (102.5 kg)     Height 05/02/17 1940 5\' 8"  (1.727 m)     Head Circumference --      Peak Flow --      Pain Score 05/02/17 1940 4   Constitutional: Alert and oriented. Well appearing and in no distress. Eyes: Conjunctivae are normal.  ENT   Head: Normocephalic and atraumatic.   Nose: No congestion/rhinnorhea.   Mouth/Throat: Mucous membranes are moist.   Neck: No stridor. Hematological/Lymphatic/Immunilogical: No cervical lymphadenopathy. Cardiovascular: Normal rate, regular rhythm.  No murmurs, rubs, or gallops.  Respiratory: Normal respiratory effort without tachypnea nor retractions. Breath sounds are clear and equal bilaterally. No wheezes/rales/rhonchi. Gastrointestinal: Soft and non tender. No rebound. No guarding.  Genitourinary: Deferred Musculoskeletal: Normal range of motion in all  extremities. No lower extremity edema. Neurologic:  Normal speech and language. No gross focal neurologic deficits are appreciated.  Skin:  Skin is warm, dry and intact. No rash noted. Psychiatric: Mood and affect are normal. Speech and behavior are normal. Patient exhibits appropriate insight and judgment.  ____________________________________________    LABS (pertinent positives/negatives)  Labs Reviewed  COMPREHENSIVE METABOLIC PANEL - Abnormal; Notable for the following:       Result Value   Sodium 134 (*)    Potassium 5.2 (*)    Glucose, Bld 137 (*)    BUN 46 (*)    All other components within normal limits  CBC - Abnormal; Notable for the following:    RBC 3.61 (*)    Hemoglobin 11.1 (*)    HCT 31.0 (*)    All other components within normal limits  LIPASE, BLOOD  TROPONIN I  URINALYSIS,  COMPLETE (UACMP) WITH MICROSCOPIC     ____________________________________________   EKG  I, Nance Pear, attending physician, personally viewed and interpreted this EKG  EKG Time: 1938 Rate: 86 Rhythm: normal sinus rhythm Axis: normal Intervals: qtc 402 QRS: narrow, q waves III, V1 ST changes: no st elevation Impression: abnormal ekg   ____________________________________________    RADIOLOGY  CT abd/pel IMPRESSION:  1. Colonic diverticulosis without acute diverticulitis.  2. Normal-appearing appendix. No bowel obstruction. Moderate colonic  stool burden.  3. Aortoiliac atherosclerosis without aneurysm. Three-vessel  coronary arteriosclerosis.  4. Nonobstructing punctate left lower pole renal calculus with  subcentimeter cysts within both kidneys, too small to further  characterize.  5. Cholecystectomy without complications.      ____________________________________________   PROCEDURES  Procedures  ____________________________________________   INITIAL IMPRESSION / ASSESSMENT AND PLAN / ED COURSE  Pertinent labs & imaging results that were  available during my care of the patient were reviewed by me and considered in my medical decision making (see chart for details).  Patient presented to the emergency department today with epigastric pain. CT scan did not show any obvious constipation symptoms. I do think likely patient has a gastric ulcer. I am concerned that this is a bleeding gastric ulcer given history of black stools. His hemoglobin level has had a significant drop from previous. Given the shortness breath and dizziness will plan on admission to the hospital service.  ____________________________________________   FINAL CLINICAL IMPRESSION(S) / ED DIAGNOSES  Final diagnoses:  Acute gastric ulcer with hemorrhage  Anemia, unspecified type     Note: This dictation was prepared with Dragon dictation. Any transcriptional errors that result from this process are unintentional     Nance Pear, MD 05/02/17 2217

## 2017-05-02 NOTE — H&P (Signed)
[facility] [place] NAME: [name]    MR#:  [id number]  DATE OF BIRTH:  03-08-1946  DATE OF ADMISSION:  05/02/2017  PRIMARY CARE PHYSICIAN: [name], MD   REQUESTING/REFERRING PHYSICIAN: Archie Balboa, MD  CHIEF COMPLAINT:   Chief Complaint  Patient presents with  . Abdominal Pain  . Shortness of Breath  . Nausea    HISTORY OF PRESENT ILLNESS:  [name]  is a 71 y.o. male who presents with Melena. He states he has been having some epigastric abdominal pain as well. He has a history several years ago of having a bleeding peptic ulcer. He states this feels similar. His hemoglobin dropped from 15 of the months ago to 11 on her lab checked today. He still having active melena in the ED. Hospitalists were called for admission  PAST MEDICAL HISTORY:   Past Medical History:  Diagnosis Date  . Aortic transection 2005   Traumatic after a fall from a second floor. s/p repair at [facility]  . Basal cell carcinoma   . CAD (coronary artery disease)   . Depression   . Diabetes mellitus    Type II  . Erectile dysfunction   . Fall 2005   fell off house: torn aorta, clavicle fracture, rib fracture, vertebral fractures, lung contusion, coma x 2 weeks  . Fracture of femoral neck, left ([place]) 08/09/2014   ORIF, Dr. Marry Guan  . Fracture of radial neck, left, closed 08/09/2014  . Gallstone pancreatitis   . Gastric ulcer   . History of ATN 08/09/2014   [facility], 2 days of dialysis (ARF)  . History of bleeding ulcers   . Hyperlipidemia    Statin with joint pain   . Hypertension   . MI (myocardial infarction) ([place]) 1992   Acute anterior MI, thrombolytic therapy  . Peripheral vascular disease [facility])    Atherosclerotic:R renal artery stenosis    PAST SURGICAL HISTORY:   Past Surgical History:  Procedure Laterality Date  . CARDIAC CATHETERIZATION  08/1991   50 % mid-Lad stenosis with clot, 25-505 second marginal  . CARDIOVASCULAR SURGERY      with ruptured Aorta, Dr. Camila Li, [facility]   . CHOLECYSTECTOMY    . KNEE SURGERY     Right   . ORIF HIP FRACTURE Left 08/12/2014   Dr. Marry Guan  . THORACOTOMY     thoracic aorta repair   . TRACHEOSTOMY     s/p reversal    SOCIAL HISTORY:   Social History  Substance Use Topics  . Smoking status: Former Smoker    Packs/day: 1.50    Years: 20.00    Types: Cigarettes    Quit date: 09/13/1991  . Smokeless tobacco: Never Used     Comment: quit post MI  . Alcohol use Yes    FAMILY HISTORY:   Family History  Problem Relation Age of Onset  . Dementia Mother 4  . Heart attack Father 72  . Diabetes Brother 57  . Heart attack Brother   . Colon cancer Neg Hx   . Stomach cancer Neg Hx   . Esophageal cancer Neg Hx   . Rectal cancer Neg Hx     DRUG ALLERGIES:   Allergies  Allergen Reactions  . Xarelto [Rivaroxaban] Other (See Comments)    Reaction: GI Bleed  . Clindamycin/Lincomycin Rash  . Dye Fdc Red [Red Dye] Rash  . Lovenox [Enoxaparin Sodium]     Rash, blackness at injection site. Multifactorial and  multiple other drugs used at the same time.  [name] Kitchen Penicillins Rash and Other (See Comments)    Has patient had a PCN reaction causing immediate rash, facial/tongue/throat swelling, SOB or lightheadedness with hypotension: Unknown Has patient had a PCN reaction causing severe rash involving mucus membranes or skin necrosis: Unknown Has patient had a PCN reaction that required hospitalization: Unknown Has patient had a PCN reaction occurring within the last 10 years: No If all of the above answers are "NO", then may proceed with Cephalosporin use.     MEDICATIONS AT HOME:   Prior to Admission medications   Medication Sig Start Date End Date Taking? Authorizing Provider  amLODipine (NORVASC) 5 MG tablet Take 5 mg by mouth daily.   Yes [provider]  aspirin 81 MG tablet Take 81 mg by mouth daily.   Yes [provider]  atorvastatin (LIPITOR) 40 MG tablet  Take 1 tablet (40 mg total) by mouth daily. 04/24/17  Yes Copland, Frederico [place], MD  citalopram (CELEXA) 20 MG tablet TAKE ONE TABLET EVERY DAY 12/23/16  Yes Copland, Frederico Hamman, MD  lisinopril (PRINIVIL,ZESTRIL) 10 MG tablet TAKE ONE TABLET EVERY DAY 10/14/16  Yes Copland, Frederico Hamman, MD  metFORMIN (GLUCOPHAGE-XR) 500 MG 24 hr tablet TAKE ONE TABLET BY MOUTH EVERY DAY 04/19/17  Yes Copland, Frederico Hamman, MD  Tiotropium Bromide-Olodaterol (STIOLTO RESPIMAT) 2.5-2.5 MCG/ACT AERS Inhale 2 puffs into the lungs daily. 03/22/17  Yes Parrett, Tammy S, NP    REVIEW OF SYSTEMS:  Review of Systems  Constitutional: Negative for chills, fever, malaise/fatigue and weight loss.  HENT: Negative for ear pain, hearing loss and tinnitus.   Eyes: Negative for blurred vision, double vision, pain and redness.  Respiratory: Negative for cough, hemoptysis and shortness of breath.   Cardiovascular: Negative for chest pain, palpitations, orthopnea and leg swelling.  Gastrointestinal: Positive for abdominal pain and melena. Negative for constipation, nausea and vomiting.  Genitourinary: Negative for dysuria, frequency and hematuria.  Musculoskeletal: Negative for back pain, joint pain and neck pain.  Skin:       No acne, rash, or lesions  Neurological: Negative for dizziness, tremors, focal weakness and weakness.  Endo/Heme/Allergies: Negative for polydipsia. Does not bruise/bleed easily.  Psychiatric/Behavioral: Negative for depression. The patient is not nervous/anxious and does not have insomnia.      VITAL SIGNS:   Vitals:   05/02/17 2145 05/02/17 2200 05/02/17 2230 05/02/17 2245  BP:  132/74 115/71   Pulse: 82 86 84 85  Resp: 15 18 12 14   Temp:      TempSrc:      SpO2: 97% 97% 95% 95%  Weight:      Height:       Wt Readings from Last 3 Encounters:  05/02/17 102.5 kg (226 lb)  03/22/17 106.1 kg (234 lb)  03/08/17 105.7 kg (233 lb)    PHYSICAL EXAMINATION:  Physical Exam  Vitals reviewed. Constitutional: He is  oriented to person, place, and time. He appears well-developed and well-nourished. No distress.  HENT:  Head: Normocephalic and atraumatic.  Mouth/Throat: Oropharynx is clear and moist.  Eyes: Conjunctivae and EOM are normal. Pupils are equal, round, and reactive to light. No scleral icterus.  Neck: Normal range of motion. Neck supple. No JVD present. No thyromegaly present.  Cardiovascular: Normal rate, regular rhythm and intact distal pulses.  Exam reveals no gallop and no friction rub.   No murmur heard. Respiratory: Effort normal and breath sounds normal. No respiratory distress. He has no wheezes. He has no  rales.  GI: Soft. Bowel sounds are normal. He exhibits no distension. There is tenderness (Epigastric).  Musculoskeletal: Normal range of motion. He exhibits no edema.  No arthritis, no gout  Lymphadenopathy:    He has no cervical adenopathy.  Neurological: He is alert and oriented to person, place, and time. No cranial nerve deficit.  No dysarthria, no aphasia  Skin: Skin is warm and dry. No rash noted. No erythema.  Psychiatric: He has a normal mood and affect. His behavior is normal. Judgment and thought content normal.    LABORATORY PANEL:   CBC  Recent Labs Lab 05/02/17 1945  WBC 7.6  HGB 11.1*  HCT 31.0*  PLT 225   ------------------------------------------------------------------------------------------------------------------  Chemistries   Recent Labs Lab 05/02/17 1945  NA 134*  K 5.2*  CL 105  CO2 22  GLUCOSE 137*  BUN 46*  CREATININE 0.97  CALCIUM 9.0  AST 21  ALT 22  ALKPHOS 61  BILITOT 0.8   ------------------------------------------------------------------------------------------------------------------  Cardiac Enzymes  Recent Labs Lab 05/02/17 1945  TROPONINI <0.03   ------------------------------------------------------------------------------------------------------------------  RADIOLOGY:  Ct Abdomen Pelvis W  Contrast  Result Date: 05/02/2017 CLINICAL DATA:  Periumbilical pain, nausea and vomiting x2 days. EXAM: CT ABDOMEN AND PELVIS WITH CONTRAST TECHNIQUE: Multidetector CT imaging of the abdomen and pelvis was performed using the standard protocol following bolus administration of intravenous contrast. CONTRAST:  189mL ISOVUE-300 IOPAMIDOL (ISOVUE-300) INJECTION 61% COMPARISON:  100 cc Isovue-300 IV FINDINGS: Lower chest: Tiny 2 mm left lower lobe pulmonary nodule. No pneumonic consolidation, effusion or pneumothorax. Heart is normal in size with three-vessel coronary arteriosclerosis. No pericardial effusion. Aortic atherosclerosis without aneurysm or dissection. Hepatobiliary: Status post cholecystectomy. Homogeneous enhancement of the liver. No biliary dilatation. Pancreas: Unremarkable. No pancreatic ductal dilatation or surrounding inflammatory changes. Spleen: Normal in size without focal abnormality. Adrenals/Urinary Tract: Normal bilateral adrenal glands. 4-5 mm circumscribed hypodensities within both kidneys consistent with cysts but are too small to further characterize. Punctate left lower pole renal calculus. No obstructive uropathy. Unremarkable bladder. Stomach/Bowel: The stomach is distended with oral contrast. There is a small hiatal hernia. There is normal small bowel rotation without obstruction or inflammation. Appendix is normal. A moderate amount of fecal retention is seen within large bowel. No acute large bowel inflammation. There is scattered left-sided colonic diverticulosis. Vascular/Lymphatic: Dense aorto iliac atherosclerosis without aneurysm. No occlusion of the branch vessels. No adenopathy. Reproductive: Normal size prostate and seminal vesicles. Other: No abdominal wall hernia or abnormality. No abdominopelvic ascites. Musculoskeletal: Small fat containing right-sided inguinal hernia as before. Surgical clips in the left inguinal region. Chronic L2 compression with approximately 60%  height loss unchanged in appearance. Mild superior endplate compression of T12 is likewise stable. Multilevel degenerative disc disease along the thoracolumbar spine. IMPRESSION: 1. Colonic diverticulosis without acute diverticulitis. 2. Normal-appearing appendix. No bowel obstruction. Moderate colonic stool burden. 3. Aortoiliac atherosclerosis without aneurysm. Three-vessel coronary arteriosclerosis. 4. Nonobstructing punctate left lower pole renal calculus with subcentimeter cysts within both kidneys, too small to further characterize. 5. Cholecystectomy without complications. Electronically Signed   By: Ashley Royalty M.D.   On: 05/02/2017 21:40    EKG:   Orders placed or performed during the hospital encounter of 05/02/17  . EKG 12-Lead  . EKG 12-Lead  . ED EKG  . ED EKG    IMPRESSION AND PLAN:  Principal Problem:   Melena - IV pantoprazole drip started, keep nothing by mouth, GI consult in the morning, every 6 hour hemoglobin checks Active Problems:  DM (diabetes mellitus) type II, controlled, with peripheral vascular disorder (HCC) - sliding scale insulin with corresponding glucose checks   Essential hypertension - continue home meds   Coronary artery disease involving native coronary artery without angina pectoris - continue home medications   Peripheral vascular disease ([place]) - continue home meds   Hyperlipidemia LDL goal <70 - home dose anti-lipids  All the records are reviewed and case discussed with ED provider. Management plans discussed with the patient and/or family.  DVT PROPHYLAXIS: mechanical only  GI PROPHYLAXIS: PPI  ADMISSION STATUS: Inpatient  CODE STATUS: Full Code Status History    This patient does not have a recorded code status. Please follow your organizational policy for patients in this situation.      TOTAL TIME TAKING CARE OF THIS PATIENT: 45 minutes.   Wessley Emert [place] 05/02/2017, 11:29 PM  [facility]  Office   [phone]  CC: Primary care physician; [name], MD  Note:  This document was prepared using Dragon voice recognition software and may include unintentional dictation errors.

## 2017-05-02 NOTE — ED Notes (Signed)
Pt to ct scan.

## 2017-05-03 ENCOUNTER — Encounter
Admission: EM | Disposition: A | Discharge status: Home Health | Payer: Self-pay | Source: Home / Self Care | Attending: Internal Medicine

## 2017-05-03 ENCOUNTER — Inpatient Hospital Stay: Payer: PPO | Admitting: Registered Nurse

## 2017-05-03 DIAGNOSIS — K921 Melena: Secondary | ICD-10-CM

## 2017-05-03 DIAGNOSIS — K253 Acute gastric ulcer without hemorrhage or perforation: Secondary | ICD-10-CM

## 2017-05-03 DIAGNOSIS — K269 Duodenal ulcer, unspecified as acute or chronic, without hemorrhage or perforation: Secondary | ICD-10-CM

## 2017-05-03 HISTORY — PX: ESOPHAGOGASTRODUODENOSCOPY (EGD) WITH PROPOFOL: SHX5813

## 2017-05-03 LAB — BASIC METABOLIC PANEL WITH GFR
Anion gap: 9 (ref 5–15)
BUN: 51 mg/dL — ABNORMAL HIGH (ref 6–20)
CO2: 20 mmol/L — ABNORMAL LOW (ref 22–32)
Calcium: 8.4 mg/dL — ABNORMAL LOW (ref 8.9–10.3)
Chloride: 106 mmol/L (ref 101–111)
Creatinine, Ser: 1.36 mg/dL — ABNORMAL HIGH (ref 0.61–1.24)
GFR calc Af Amer: 60 mL/min — ABNORMAL LOW
GFR calc non Af Amer: 52 mL/min — ABNORMAL LOW
Glucose, Bld: 174 mg/dL — ABNORMAL HIGH (ref 65–99)
Potassium: 5.2 mmol/L — ABNORMAL HIGH (ref 3.5–5.1)
Sodium: 135 mmol/L (ref 135–145)

## 2017-05-03 LAB — GLUCOSE, CAPILLARY
Glucose-Capillary: 168 mg/dL — ABNORMAL HIGH (ref 65–99)
Glucose-Capillary: 129 mg/dL — ABNORMAL HIGH (ref 65–99)

## 2017-05-03 LAB — CBC
HCT: 24.4 % — ABNORMAL LOW (ref 40.0–52.0)
Hemoglobin: 8.7 g/dL — ABNORMAL LOW (ref 13.0–18.0)
MCH: 31.3 pg (ref 26.0–34.0)
MCHC: 35.8 g/dL (ref 32.0–36.0)
MCV: 87.4 fL (ref 80.0–100.0)
Platelets: 186 10*3/uL (ref 150–440)
RBC: 2.79 MIL/uL — ABNORMAL LOW (ref 4.40–5.90)
RDW: 13.5 % (ref 11.5–14.5)
WBC: 9.7 10*3/uL (ref 3.8–10.6)

## 2017-05-03 LAB — HEMOGLOBIN
Hemoglobin: 9.1 g/dL — ABNORMAL LOW (ref 13.0–18.0)
Hemoglobin: 8.3 g/dL — ABNORMAL LOW (ref 13.0–18.0)

## 2017-05-03 SURGERY — ESOPHAGOGASTRODUODENOSCOPY (EGD) WITH PROPOFOL
Anesthesia: General

## 2017-05-03 MED ORDER — FENTANYL CITRATE (PF) 100 MCG/2ML IJ SOLN
INTRAMUSCULAR | Status: DC | PRN
  Administered 2017-05-03: 50 ug via INTRAVENOUS

## 2017-05-03 MED ORDER — PHENYLEPHRINE HCL 10 MG/ML IJ SOLN
INTRAMUSCULAR | Status: DC | PRN
  Administered 2017-05-03 (×3): 100 ug via INTRAVENOUS

## 2017-05-03 MED ORDER — PROPOFOL 500 MG/50ML IV EMUL
INTRAVENOUS | Status: DC | PRN
  Administered 2017-05-03: 150 ug/kg/min via INTRAVENOUS

## 2017-05-03 MED ORDER — ALPRAZOLAM 0.5 MG PO TABS
0.5000 mg | ORAL_TABLET | Freq: Once | ORAL | Status: AC
  Administered 2017-05-03: 0.5 mg via ORAL
  Filled 2017-05-03: qty 1

## 2017-05-03 MED ORDER — ARFORMOTEROL TARTRATE 15 MCG/2ML IN NEBU
15.0000 ug | INHALATION_SOLUTION | Freq: Two times a day (BID) | RESPIRATORY_TRACT | Status: DC
  Administered 2017-05-03 – 2017-05-04 (×3): 15 ug via RESPIRATORY_TRACT
  Filled 2017-05-03 (×4): qty 2

## 2017-05-03 MED ORDER — ONDANSETRON HCL 4 MG PO TABS: 4.0000 mg | ORAL_TABLET | Freq: Four times a day (QID) | ORAL | Status: DC | PRN

## 2017-05-03 MED ORDER — ATORVASTATIN CALCIUM 20 MG PO TABS
40.0000 mg | ORAL_TABLET | Freq: Every day | ORAL | Status: DC
  Administered 2017-05-03 – 2017-05-07 (×4): 40 mg via ORAL
  Filled 2017-05-03 (×4): qty 2

## 2017-05-03 MED ORDER — INSULIN ASPART 100 UNIT/ML ~~LOC~~ SOLN
0.0000 [IU] | Freq: Three times a day (TID) | SUBCUTANEOUS | Status: DC
  Administered 2017-05-03 – 2017-05-04 (×2): 2 [IU] via SUBCUTANEOUS
  Administered 2017-05-04: 1 [IU] via SUBCUTANEOUS
  Administered 2017-05-05 (×2): 3 [IU] via SUBCUTANEOUS
  Administered 2017-05-05 – 2017-05-06 (×2): 2 [IU] via SUBCUTANEOUS
  Administered 2017-05-06: 3 [IU] via SUBCUTANEOUS
  Administered 2017-05-07: 5 [IU] via SUBCUTANEOUS
  Administered 2017-05-07 (×2): 2 [IU] via SUBCUTANEOUS
  Administered 2017-05-08 (×2): 3 [IU] via SUBCUTANEOUS
  Filled 2017-05-03: qty 2
  Filled 2017-05-03: qty 3
  Filled 2017-05-03 (×2): qty 2
  Filled 2017-05-03 (×2): qty 5
  Filled 2017-05-03: qty 3
  Filled 2017-05-03: qty 2
  Filled 2017-05-03 (×3): qty 3
  Filled 2017-05-03: qty 1
  Filled 2017-05-03: qty 2

## 2017-05-03 MED ORDER — AMLODIPINE BESYLATE 5 MG PO TABS
5.0000 mg | ORAL_TABLET | Freq: Every day | ORAL | Status: DC
  Administered 2017-05-04: 5 mg via ORAL
  Filled 2017-05-03: qty 1

## 2017-05-03 MED ORDER — LIDOCAINE HCL (PF) 2 % IJ SOLN
INTRAMUSCULAR | Status: AC
  Filled 2017-05-03: qty 2

## 2017-05-03 MED ORDER — UMECLIDINIUM BROMIDE 62.5 MCG/INH IN AEPB
1.0000 | INHALATION_SPRAY | Freq: Every day | RESPIRATORY_TRACT | Status: DC
  Administered 2017-05-03 – 2017-05-04 (×2): 1 via RESPIRATORY_TRACT
  Filled 2017-05-03: qty 7

## 2017-05-03 MED ORDER — GLYCOPYRROLATE 0.2 MG/ML IJ SOLN
INTRAMUSCULAR | Status: AC
  Filled 2017-05-03: qty 1

## 2017-05-03 MED ORDER — SODIUM CHLORIDE 0.9 % IV SOLN
INTRAVENOUS | Status: DC
  Administered 2017-05-03 (×2): via INTRAVENOUS

## 2017-05-03 MED ORDER — SEVOFLURANE IN SOLN
RESPIRATORY_TRACT | Status: AC
  Filled 2017-05-03: qty 250

## 2017-05-03 MED ORDER — ACETAMINOPHEN 325 MG PO TABS
650.0000 mg | ORAL_TABLET | Freq: Four times a day (QID) | ORAL | Status: DC | PRN
  Administered 2017-05-03 – 2017-05-05 (×2): 650 mg via ORAL
  Filled 2017-05-03 (×3): qty 2

## 2017-05-03 MED ORDER — ONDANSETRON HCL 4 MG/2ML IJ SOLN
4.0000 mg | Freq: Four times a day (QID) | INTRAMUSCULAR | Status: DC | PRN
  Administered 2017-05-03: 4 mg via INTRAVENOUS
  Filled 2017-05-03: qty 2

## 2017-05-03 MED ORDER — TRAMADOL HCL 50 MG PO TABS
50.0000 mg | ORAL_TABLET | Freq: Four times a day (QID) | ORAL | Status: DC | PRN
  Administered 2017-05-03 – 2017-05-06 (×4): 50 mg via ORAL
  Filled 2017-05-03 (×4): qty 1

## 2017-05-03 MED ORDER — PROPOFOL 10 MG/ML IV BOLUS
INTRAVENOUS | Status: DC | PRN
  Administered 2017-05-03: 70 mg via INTRAVENOUS
  Administered 2017-05-03: 30 mg via INTRAVENOUS

## 2017-05-03 MED ORDER — PROPOFOL 500 MG/50ML IV EMUL
INTRAVENOUS | Status: AC
  Filled 2017-05-03: qty 50

## 2017-05-03 MED ORDER — LISINOPRIL 10 MG PO TABS
10.0000 mg | ORAL_TABLET | Freq: Every day | ORAL | Status: DC
  Administered 2017-05-04: 10 mg via ORAL
  Filled 2017-05-03: qty 1

## 2017-05-03 MED ORDER — FENTANYL CITRATE (PF) 100 MCG/2ML IJ SOLN
INTRAMUSCULAR | Status: AC
  Filled 2017-05-03: qty 2

## 2017-05-03 MED ORDER — ACETAMINOPHEN 650 MG RE SUPP: 650.0000 mg | Freq: Four times a day (QID) | RECTAL | Status: DC | PRN

## 2017-05-03 MED ORDER — CITALOPRAM HYDROBROMIDE 20 MG PO TABS
20.0000 mg | ORAL_TABLET | Freq: Every day | ORAL | Status: DC
  Administered 2017-05-03 – 2017-05-08 (×6): 20 mg via ORAL
  Filled 2017-05-03 (×6): qty 1

## 2017-05-03 NOTE — Anesthesia Preprocedure Evaluation (Addendum)
Anesthesia Evaluation  Patient identified by MRN, date of birth, ID band Patient awake    Reviewed: Allergy & Precautions, NPO status , Patient's Chart, lab work & pertinent test results  History of Anesthesia Complications Negative for: history of anesthetic complications  Airway Mallampati: II  TM Distance: >3 FB Neck ROM: Full    Dental  (+) Edentulous Lower, Edentulous Upper   Pulmonary neg sleep apnea, COPD, former smoker,    breath sounds clear to auscultation- rhonchi (-) wheezing      Cardiovascular hypertension, Pt. on medications + CAD and + Past MI  (-) Cardiac Stents and (-) CABG  Rhythm:Regular Rate:Normal - Systolic murmurs and - Diastolic murmurs Echo 05/39/76:  - Left ventricle: The cavity size was normal. Wall thickness was   normal. Systolic function was normal. The estimated ejection   fraction was in the range of 55% to 60%. Wall motion was normal;   there were no regional wall motion abnormalities. Doppler   parameters are consistent with abnormal left ventricular   relaxation (grade 1 diastolic dysfunction). - Left atrium: The atrium was mildly dilated. - Pulmonary arteries: Systolic pressure was within the normal   range.    Neuro/Psych PSYCHIATRIC DISORDERS Depression negative neurological ROS     GI/Hepatic Neg liver ROS, PUD, GIB    Endo/Other  diabetes, Oral Hypoglycemic Agents  Renal/GU negative Renal ROS     Musculoskeletal negative musculoskeletal ROS (+)   Abdominal (+) + obese,   Peds  Hematology negative hematology ROS (+)   Anesthesia Other Findings Past Medical History: 2005: Aortic transection     Comment: Traumatic after a fall from a second floor.               s/p repair at [facility] No date: Basal cell carcinoma No date: CAD (coronary artery disease) No date: Depression No date: Diabetes mellitus     Comment: Type II No date: Erectile dysfunction 2005: Fall  Comment: fell off house: torn aorta, clavicle fracture,              rib fracture, vertebral fractures, lung               contusion, coma x 2 weeks 08/09/2014: Fracture of femoral neck, left (HCC)     Comment: ORIF, Dr. Marry Guan 08/09/2014: Fracture of radial neck, left, closed No date: Gallstone pancreatitis No date: Gastric ulcer 08/09/2014: History of ATN     Comment: [facility], 2 days of dialysis (ARF) No date: History of bleeding ulcers No date: Hyperlipidemia     Comment: Statin with joint pain  No date: Hypertension 1992: MI (myocardial infarction) ([place])     Comment: Acute anterior MI, thrombolytic therapy No date: Peripheral vascular disease ([place])     Comment: Atherosclerotic:R renal artery stenosis   Reproductive/Obstetrics                           Anesthesia Physical Anesthesia Plan  ASA: III  Anesthesia Plan: General   Post-op Pain Management:    Induction: Intravenous  Airway Management Planned: Natural Airway  Additional Equipment:   Intra-op Plan:   Post-operative Plan:   Informed Consent: I have reviewed the patients History and Physical, chart, labs and discussed the procedure including the risks, benefits and alternatives for the proposed anesthesia with the patient or authorized representative who has indicated his/her understanding and acceptance.   Dental advisory given  Plan Discussed with: CRNA and Anesthesiologist  Anesthesia Plan  Comments:         Lab Results  Component Value Date   WBC 9.7 05/03/2017   HGB 8.7 (L) 05/03/2017   HCT 24.4 (L) 05/03/2017   MCV 87.4 05/03/2017   PLT 186 05/03/2017    Anesthesia Quick Evaluation

## 2017-05-03 NOTE — Progress Notes (Signed)
[facility] [place] NAME: [name]    MR#:  [id number]  DATE OF BIRTH:  25-Apr-1946  SUBJECTIVE:  CHIEF COMPLAINT:   Chief Complaint  Patient presents with  . Abdominal Pain  . Shortness of Breath  . Nausea   No abdominal pain. [place] continues.  REVIEW OF SYSTEMS:    Review of Systems  Constitutional: Positive for malaise/fatigue. Negative for chills and fever.  HENT: Negative for sore throat.   Eyes: Negative for blurred vision, double vision and pain.  Respiratory: Negative for cough, hemoptysis, shortness of breath and wheezing.   Cardiovascular: Negative for chest pain, palpitations, orthopnea and leg swelling.  Gastrointestinal: Positive for melena and nausea. Negative for abdominal pain, constipation, diarrhea, heartburn and vomiting.  Genitourinary: Negative for dysuria and hematuria.  Musculoskeletal: Negative for back pain and joint pain.  Skin: Negative for rash.  Neurological: Positive for weakness. Negative for sensory change, speech change, focal weakness and headaches.  Endo/Heme/Allergies: Does not bruise/bleed easily.  Psychiatric/Behavioral: Negative for depression. The patient is not nervous/anxious.     DRUG ALLERGIES:   Allergies  Allergen Reactions  . Xarelto [Rivaroxaban] Other (See Comments)    Reaction: GI Bleed  . Clindamycin/Lincomycin Rash  . Dye Fdc Red [Red Dye] Rash  . Lovenox [Enoxaparin Sodium]     Rash, blackness at injection site. Multifactorial and multiple other drugs used at the same time.  [name] Kitchen Penicillins Rash and Other (See Comments)    Has patient had a PCN reaction causing immediate rash, facial/tongue/throat swelling, SOB or lightheadedness with hypotension: Unknown Has patient had a PCN reaction causing severe rash involving mucus membranes or skin necrosis: Unknown Has patient had a PCN reaction that required hospitalization: Unknown Has patient had a PCN reaction occurring within  the last 10 years: No If all of the above answers are "NO", then may proceed with Cephalosporin use.     VITALS:  Blood pressure (!) 101/49, pulse (!) 105, temperature 98.7 F (37.1 C), temperature source Tympanic, resp. rate 19, height 5\' 8"  (1.727 m), weight 101.5 kg (223 lb 11.2 oz), SpO2 97 %.  PHYSICAL EXAMINATION:   Physical Exam  GENERAL:  71 y.o.-year-old patient lying in the bed with no acute distress.  EYES: Pupils equal, round, reactive to light and accommodation. No scleral icterus. Extraocular muscles intact.  HEENT: Head atraumatic, normocephalic. Oropharynx and nasopharynx clear.  NECK:  Supple, no jugular venous distention. No thyroid enlargement, no tenderness.  LUNGS: Normal breath sounds bilaterally, no wheezing, rales, rhonchi. No use of accessory muscles of respiration.  CARDIOVASCULAR: S1, S2 normal. No murmurs, rubs, or gallops.  ABDOMEN: Soft, nontender, nondistended. Bowel sounds present. No organomegaly or mass.  EXTREMITIES: No cyanosis, clubbing or edema b/l.    NEUROLOGIC: Cranial nerves II through XII are intact. No focal Motor or sensory deficits b/l.   PSYCHIATRIC: The patient is alert and oriented x 3.  SKIN: No obvious rash, lesion, or ulcer.   LABORATORY PANEL:   CBC  Recent Labs Lab 05/03/17 0739  WBC 9.7  HGB 8.7*  HCT 24.4*  PLT 186   ------------------------------------------------------------------------------------------------------------------ Chemistries   Recent Labs Lab 05/02/17 1945 05/03/17 0739  NA 134* 135  K 5.2* 5.2*  CL 105 106  CO2 22 20*  GLUCOSE 137* 174*  BUN 46* 51*  CREATININE 0.97 1.36*  CALCIUM 9.0 8.4*  AST 21  --   ALT 22  --   ALKPHOS 61  --  BILITOT 0.8  --    ------------------------------------------------------------------------------------------------------------------  Cardiac Enzymes  Recent Labs Lab 05/02/17 1945  TROPONINI <0.03    ------------------------------------------------------------------------------------------------------------------  RADIOLOGY:  Ct Abdomen Pelvis W Contrast  Result Date: 05/02/2017 CLINICAL DATA:  Periumbilical pain, nausea and vomiting x2 days. EXAM: CT ABDOMEN AND PELVIS WITH CONTRAST TECHNIQUE: Multidetector CT imaging of the abdomen and pelvis was performed using the standard protocol following bolus administration of intravenous contrast. CONTRAST:  140mL ISOVUE-300 IOPAMIDOL (ISOVUE-300) INJECTION 61% COMPARISON:  100 cc Isovue-300 IV FINDINGS: Lower chest: Tiny 2 mm left lower lobe pulmonary nodule. No pneumonic consolidation, effusion or pneumothorax. Heart is normal in size with three-vessel coronary arteriosclerosis. No pericardial effusion. Aortic atherosclerosis without aneurysm or dissection. Hepatobiliary: Status post cholecystectomy. Homogeneous enhancement of the liver. No biliary dilatation. Pancreas: Unremarkable. No pancreatic ductal dilatation or surrounding inflammatory changes. Spleen: Normal in size without focal abnormality. Adrenals/Urinary Tract: Normal bilateral adrenal glands. 4-5 mm circumscribed hypodensities within both kidneys consistent with cysts but are too small to further characterize. Punctate left lower pole renal calculus. No obstructive uropathy. Unremarkable bladder. Stomach/Bowel: The stomach is distended with oral contrast. There is a small hiatal hernia. There is normal small bowel rotation without obstruction or inflammation. Appendix is normal. A moderate amount of fecal retention is seen within large bowel. No acute large bowel inflammation. There is scattered left-sided colonic diverticulosis. Vascular/Lymphatic: Dense aorto iliac atherosclerosis without aneurysm. No occlusion of the branch vessels. No adenopathy. Reproductive: Normal size prostate and seminal vesicles. Other: No abdominal wall hernia or abnormality. No abdominopelvic ascites.  Musculoskeletal: Small fat containing right-sided inguinal hernia as before. Surgical clips in the left inguinal region. Chronic L2 compression with approximately 60% height loss unchanged in appearance. Mild superior endplate compression of T12 is likewise stable. Multilevel degenerative disc disease along the thoracolumbar spine. IMPRESSION: 1. Colonic diverticulosis without acute diverticulitis. 2. Normal-appearing appendix. No bowel obstruction. Moderate colonic stool burden. 3. Aortoiliac atherosclerosis without aneurysm. Three-vessel coronary arteriosclerosis. 4. Nonobstructing punctate left lower pole renal calculus with subcentimeter cysts within both kidneys, too small to further characterize. 5. Cholecystectomy without complications. Electronically Signed   By: Ashley Royalty M.D.   On: 05/02/2017 21:40     ASSESSMENT AND PLAN:   * Acute blood loss anemia due to upper GI bleeding Protonix drip Serial hemoglobin checks. Transfuse if any further drop in hemoglobin. Aspirin stopped. No anticoagulation. Christopher Dr. Allen Norris of GI regarding an EGD later today.  *  DM (diabetes mellitus) type II, Sliding scale insulin per.   * Essential hypertension - continue home meds    * Coronary artery disease involving native coronary artery without angina pectoris - continue home medications. Stop aspirin.   * Peripheral vascular disease ([place]) - continue home meds.   * Hyperlipidemia LDL goal <70 - home dose anti-lipids.  All the records are reviewed and case discussed with Care Management/Social Workerr. Management plans discussed with the patient, family and they are in agreement.  CODE STATUS: FULL CODE  DVT Prophylaxis: SCDs  TOTAL TIME TAKING CARE OF THIS PATIENT: 30 minutes.   POSSIBLE D/C IN 1-2 DAYS, DEPENDING ON CLINICAL CONDITION.  Hillary Bow R M.D on 05/03/2017 at 12:56 PM  Between 7am to 6pm - Pager - [phone]  After 6pm go to [email or web address] - password [email or web address]  [facility]  Office  [phone]  CC: Primary care physician; [name], MD  Note: This dictation was prepared with Dragon dictation along with smaller phrase technology. Any transcriptional errors  that result from this process are unintentional.
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
## 2017-05-03 NOTE — Anesthesia Postprocedure Evaluation (Signed)
Anesthesia Post Note  Patient: [name]  Procedure(s) Performed: Procedure(s) (LRB): ESOPHAGOGASTRODUODENOSCOPY (EGD) WITH PROPOFOL (N/A)  Patient location during evaluation: Endoscopy Anesthesia Type: General Level of consciousness: awake and alert and oriented Pain management: pain level controlled Vital Signs Assessment: post-procedure vital signs reviewed and stable Respiratory status: spontaneous breathing, nonlabored ventilation and respiratory function stable Cardiovascular status: blood pressure returned to baseline and stable Postop Assessment: no signs of nausea or vomiting Anesthetic complications: no     Last Vitals:  Vitals:   05/03/17 1320 05/03/17 1347  BP: (!) 119/54 (!) 125/54  Pulse: 96 91  Resp: 17 16  Temp:  37.1 C    Last Pain:  Vitals:   05/03/17 1347  TempSrc: Oral  PainSc:                  Rashel Okeefe

## 2017-05-03 NOTE — Progress Notes (Signed)
Pt c/o pain and anxiety. Notified dr.willis and acknowledged and new orders placed.

## 2017-05-03 NOTE — Transfer of Care (Signed)
Immediate Anesthesia Transfer of Care Note  Patient: [name]  Procedure(s) Performed: Procedure(s): ESOPHAGOGASTRODUODENOSCOPY (EGD) WITH PROPOFOL (N/A)  Patient Location: PACU  Anesthesia Type:General  Level of Consciousness: awake, alert  and oriented  Airway & Oxygen Therapy: Patient Spontanous Breathing and Patient connected to nasal cannula oxygen  Post-op Assessment: Report given to RN and Post -op Vital signs reviewed and stable  Post vital signs: Reviewed and stable  Last Vitals:  Vitals:   05/03/17 1252 05/03/17 1253  BP: (!) 101/49 (!) 101/49  Pulse: (!) 106 (!) 105  Resp: 20 19  Temp:  37.1 C    Last Pain:  Vitals:   05/03/17 1253  TempSrc: Tympanic  PainSc:          Complications: No apparent anesthesia complications

## 2017-05-03 NOTE — Anesthesia Post-op Follow-up Note (Cosign Needed)
Anesthesia QCDR form completed.        

## 2017-05-03 NOTE — ED Notes (Signed)
Pt transport to 224

## 2017-05-03 NOTE — Op Note (Signed)
[facility] Patient Name: [name] Procedure Date: 05/03/2017 12:34 PM MRN: [id number] Account #: [id number] Date of Birth: 28-Nov-1946 Admit Type: Inpatient Age: 71 Room: [facility] ENDO ROOM 4 Gender: Male Note Status: Finalized Procedure:            Upper GI endoscopy Indications:          Melena Providers:            Lucilla Lame MD, MD Medicines:            Propofol per Anesthesia Complications:        No immediate complications. Procedure:            Pre-Anesthesia Assessment:                       - Prior to the procedure, a History and Physical was                        performed, and patient medications and allergies were                        reviewed. The patient's tolerance of previous                        anesthesia was also reviewed. The risks and benefits of                        the procedure and the sedation options and risks were                        discussed with the patient. All questions were                        answered, and informed consent was obtained. Prior                        Anticoagulants: The patient has taken no previous                        anticoagulant or antiplatelet agents. ASA Grade                        Assessment: II - A patient with mild systemic disease.                        After reviewing the risks and benefits, the patient was                        deemed in satisfactory condition to undergo the                        procedure.                       After obtaining informed consent, the endoscope was                        passed under direct vision. Throughout the procedure,                        the patient's blood pressure, pulse, and  oxygen                        saturations were monitored continuously. The Endoscope                        was introduced through the mouth, and advanced to the                        second part of duodenum. The upper GI endoscopy was       accomplished without difficulty. The patient tolerated                        the procedure well. Findings:      A small hiatal hernia was present.      One non-bleeding cratered gastric ulcer with no stigmata of bleeding was       found in the gastric antrum. The lesion was 4 mm in largest dimension.      Multiple dispersed, non-bleeding erosions were found in the gastric       antrum. There were no stigmata of recent bleeding.      One non-bleeding cratered duodenal ulcer with no stigmata of bleeding       was found in the second portion of the duodenum. Impression:           - Small hiatal hernia.                       - Non-bleeding gastric ulcer with no stigmata of                        bleeding.                       - Non-bleeding erosive gastropathy.                       - One non-bleeding duodenal ulcer with no stigmata of                        bleeding.                       - No specimens collected. Recommendation:       - Return patient to hospital ward for ongoing care.                       - Resume regular diet.                       - Continue present medications.                       - Use a proton pump inhibitor PO daily.                       - Perform an H. pylori serology. Procedure Code(s):    --- Professional ---                       [phone], Esophagogastroduodenoscopy, flexible, transoral;                        diagnostic, including collection of  specimen(s) by                        brushing or washing, when performed (separate procedure) Diagnosis Code(s):    --- Professional ---                       K92.1, Melena (includes Hematochezia)                       K25.9, Gastric ulcer, unspecified as acute or chronic,                        without hemorrhage or perforation                       K31.89, Other diseases of stomach and duodenum                       K26.9, Duodenal ulcer, unspecified as acute or chronic,                        without  hemorrhage or perforation CPT copyright 2016 [employer]. All rights reserved. The codes documented in this report are preliminary and upon coder review may  be revised to meet current compliance requirements. Lucilla Lame MD, MD 05/03/2017 12:46:24 PM This report has been signed electronically. Number of Addenda: 0 Note Initiated On: 05/03/2017 12:34 PM      [facility]

## 2017-05-04 ENCOUNTER — Inpatient Hospital Stay: Payer: PPO

## 2017-05-04 ENCOUNTER — Encounter: Payer: Self-pay | Admitting: Gastroenterology

## 2017-05-04 LAB — BLOOD GAS, ARTERIAL
Acid-base deficit: 5.1 mmol/L — ABNORMAL HIGH (ref 0.0–2.0)
Bicarbonate: 19.7 mmol/L — ABNORMAL LOW (ref 20.0–28.0)
FIO2: 0.28
O2 Saturation: 85.9 %
Patient temperature: 37
pCO2 arterial: 34 mmHg (ref 32.0–48.0)
pH, Arterial: 7.37 (ref 7.350–7.450)
pO2, Arterial: 53 mmHg — ABNORMAL LOW (ref 83.0–108.0)

## 2017-05-04 LAB — CBC WITH DIFFERENTIAL/PLATELET
Basophils Absolute: 0.1 10*3/uL (ref 0–0.1)
Basophils Relative: 0 %
Eosinophils Absolute: 0.1 10*3/uL (ref 0–0.7)
Eosinophils Relative: 0 %
HCT: 20.5 % — ABNORMAL LOW (ref 40.0–52.0)
Hemoglobin: 7 g/dL — ABNORMAL LOW (ref 13.0–18.0)
Lymphocytes Relative: 1 %
Lymphs Abs: 0.2 10*3/uL — ABNORMAL LOW (ref 1.0–3.6)
MCH: 30.2 pg (ref 26.0–34.0)
MCHC: 34 g/dL (ref 32.0–36.0)
MCV: 88.8 fL (ref 80.0–100.0)
Monocytes Absolute: 0.8 10*3/uL (ref 0.2–1.0)
Monocytes Relative: 4 %
Neutro Abs: 19.7 10*3/uL — ABNORMAL HIGH (ref 1.4–6.5)
Neutrophils Relative %: 95 %
Platelets: 199 10*3/uL (ref 150–440)
RBC: 2.31 MIL/uL — ABNORMAL LOW (ref 4.40–5.90)
RDW: 14.1 % (ref 11.5–14.5)
WBC: 20.9 10*3/uL — ABNORMAL HIGH (ref 3.8–10.6)

## 2017-05-04 LAB — PREPARE RBC (CROSSMATCH)

## 2017-05-04 LAB — HEMOGLOBIN: Hemoglobin: 8 g/dL — ABNORMAL LOW (ref 13.0–18.0)

## 2017-05-04 LAB — COMPREHENSIVE METABOLIC PANEL WITH GFR
ALT: 21 U/L (ref 17–63)
AST: 28 U/L (ref 15–41)
Albumin: 3.6 g/dL (ref 3.5–5.0)
Alkaline Phosphatase: 59 U/L (ref 38–126)
Anion gap: 7 (ref 5–15)
BUN: 40 mg/dL — ABNORMAL HIGH (ref 6–20)
CO2: 21 mmol/L — ABNORMAL LOW (ref 22–32)
Calcium: 8.1 mg/dL — ABNORMAL LOW (ref 8.9–10.3)
Chloride: 104 mmol/L (ref 101–111)
Creatinine, Ser: 1.98 mg/dL — ABNORMAL HIGH (ref 0.61–1.24)
GFR calc Af Amer: 38 mL/min — ABNORMAL LOW
GFR calc non Af Amer: 33 mL/min — ABNORMAL LOW
Glucose, Bld: 173 mg/dL — ABNORMAL HIGH (ref 65–99)
Potassium: 5.2 mmol/L — ABNORMAL HIGH (ref 3.5–5.1)
Sodium: 132 mmol/L — ABNORMAL LOW (ref 135–145)
Total Bilirubin: 1.1 mg/dL (ref 0.3–1.2)
Total Protein: 5.8 g/dL — ABNORMAL LOW (ref 6.5–8.1)

## 2017-05-04 LAB — LACTIC ACID, PLASMA
Lactic Acid, Venous: 3.1 mmol/L (ref 0.5–1.9)
Lactic Acid, Venous: 4.3 mmol/L (ref 0.5–1.9)

## 2017-05-04 LAB — H. PYLORI ANTIBODY, IGG: H Pylori IgG: <0.8 {index_val} (ref 0.00–0.79)

## 2017-05-04 LAB — PROCALCITONIN
Procalcitonin: 0.36 ng/mL
Procalcitonin: 0.3 ng/mL

## 2017-05-04 LAB — GLUCOSE, CAPILLARY
Glucose-Capillary: 134 mg/dL — ABNORMAL HIGH (ref 65–99)
Glucose-Capillary: 158 mg/dL — ABNORMAL HIGH (ref 65–99)
Glucose-Capillary: 155 mg/dL — ABNORMAL HIGH (ref 65–99)
Glucose-Capillary: 285 mg/dL — ABNORMAL HIGH (ref 65–99)
Glucose-Capillary: 271 mg/dL — ABNORMAL HIGH (ref 65–99)

## 2017-05-04 LAB — ABO/RH: ABO/RH(D): O POS

## 2017-05-04 LAB — MRSA PCR SCREENING: MRSA by PCR: NEGATIVE

## 2017-05-04 MED ORDER — VANCOMYCIN HCL IN DEXTROSE 1-5 GM/200ML-% IV SOLN
1000.0000 mg | Freq: Once | INTRAVENOUS | Status: AC
  Administered 2017-05-04: 1000 mg via INTRAVENOUS
  Filled 2017-05-04: qty 200

## 2017-05-04 MED ORDER — INSULIN ASPART 100 UNIT/ML ~~LOC~~ SOLN
5.0000 [IU] | Freq: Once | SUBCUTANEOUS | Status: AC
  Administered 2017-05-04: 5 [IU] via SUBCUTANEOUS

## 2017-05-04 MED ORDER — SODIUM CHLORIDE 0.9 % IV SOLN
Freq: Once | INTRAVENOUS | Status: AC
  Administered 2017-05-04: 16:00:00 via INTRAVENOUS

## 2017-05-04 MED ORDER — IPRATROPIUM-ALBUTEROL 0.5-2.5 (3) MG/3ML IN SOLN
3.0000 mL | RESPIRATORY_TRACT | Status: AC
  Administered 2017-05-04: 3 mL via RESPIRATORY_TRACT
  Filled 2017-05-04: qty 3

## 2017-05-04 MED ORDER — LACTATED RINGERS IV SOLN
INTRAVENOUS | Status: DC
  Administered 2017-05-04: 23:00:00 via INTRAVENOUS

## 2017-05-04 MED ORDER — SODIUM CHLORIDE 0.9 % IV SOLN
INTRAVENOUS | Status: DC
  Administered 2017-05-04: 20:00:00 via INTRAVENOUS

## 2017-05-04 MED ORDER — ALPRAZOLAM 0.5 MG PO TABS
0.5000 mg | ORAL_TABLET | Freq: Two times a day (BID) | ORAL | Status: DC | PRN
  Administered 2017-05-05 – 2017-05-06 (×2): 0.5 mg via ORAL
  Filled 2017-05-04 (×3): qty 1

## 2017-05-04 MED ORDER — IPRATROPIUM-ALBUTEROL 0.5-2.5 (3) MG/3ML IN SOLN
3.0000 mL | RESPIRATORY_TRACT | Status: DC
  Administered 2017-05-04 – 2017-05-05 (×5): 3 mL via RESPIRATORY_TRACT
  Filled 2017-05-04 (×5): qty 3

## 2017-05-04 MED ORDER — SODIUM CHLORIDE 0.9 % IV BOLUS (SEPSIS): 1000.0000 mL | Freq: Once | INTRAVENOUS | Status: DC

## 2017-05-04 MED ORDER — UMECLIDINIUM BROMIDE 62.5 MCG/INH IN AEPB
1.0000 | INHALATION_SPRAY | Freq: Every day | RESPIRATORY_TRACT | Status: DC
  Administered 2017-05-06 – 2017-05-08 (×3): 1 via RESPIRATORY_TRACT

## 2017-05-04 MED ORDER — METHYLPREDNISOLONE SODIUM SUCC 125 MG IJ SOLR
60.0000 mg | Freq: Two times a day (BID) | INTRAMUSCULAR | Status: DC
  Administered 2017-05-04: 60 mg via INTRAVENOUS
  Filled 2017-05-04: qty 2

## 2017-05-04 MED ORDER — SODIUM CHLORIDE 0.9 % IV SOLN
Freq: Once | INTRAVENOUS | Status: AC
  Administered 2017-05-04: 17:00:00 via INTRAVENOUS

## 2017-05-04 MED ORDER — SODIUM CHLORIDE 0.9 % IV SOLN
0.0000 ug/min | INTRAVENOUS | Status: DC
  Administered 2017-05-04: 10 ug/min via INTRAVENOUS
  Administered 2017-05-04: 20 ug/min via INTRAVENOUS
  Filled 2017-05-04: qty 1

## 2017-05-04 MED ORDER — SODIUM CHLORIDE 0.9 % IV SOLN
2.0000 g | Freq: Two times a day (BID) | INTRAVENOUS | Status: DC
  Administered 2017-05-04: 2 g via INTRAVENOUS
  Filled 2017-05-04 (×3): qty 2

## 2017-05-04 MED ORDER — ORAL CARE MOUTH RINSE
15.0000 mL | Freq: Two times a day (BID) | OROMUCOSAL | Status: DC
  Administered 2017-05-04 – 2017-05-08 (×7): 15 mL via OROMUCOSAL

## 2017-05-04 MED ORDER — SODIUM CHLORIDE 0.9 % IV BOLUS (SEPSIS)
500.0000 mL | Freq: Once | INTRAVENOUS | Status: AC
  Administered 2017-05-04: 500 mL via INTRAVENOUS

## 2017-05-04 MED ORDER — SODIUM CHLORIDE 0.9 % IV SOLN
0.0000 ug/min | INTRAVENOUS | Status: DC
  Administered 2017-05-05: 25 ug/min via INTRAVENOUS
  Filled 2017-05-04: qty 1

## 2017-05-04 MED ORDER — OXYCODONE HCL 5 MG PO TABS
5.0000 mg | ORAL_TABLET | Freq: Once | ORAL | Status: AC
  Administered 2017-05-04: 5 mg via ORAL
  Filled 2017-05-04: qty 1

## 2017-05-04 MED ORDER — METHYLPREDNISOLONE SODIUM SUCC 40 MG IJ SOLR: 40.0000 mg | Freq: Every day | INTRAMUSCULAR | Status: DC

## 2017-05-04 MED ORDER — PANTOPRAZOLE SODIUM 40 MG PO TBEC: 40.0000 mg | DELAYED_RELEASE_TABLET | Freq: Every day | ORAL | 0 refills | Status: DC

## 2017-05-04 MED ORDER — LEVOFLOXACIN IN D5W 500 MG/100ML IV SOLN
500.0000 mg | Freq: Once | INTRAVENOUS | Status: DC
  Filled 2017-05-04: qty 100

## 2017-05-04 MED ORDER — VANCOMYCIN HCL IN DEXTROSE 1-5 GM/200ML-% IV SOLN
1000.0000 mg | INTRAVENOUS | Status: DC
  Administered 2017-05-05: 1000 mg via INTRAVENOUS
  Filled 2017-05-04 (×2): qty 200

## 2017-05-04 MED ORDER — LEVOFLOXACIN IN D5W 250 MG/50ML IV SOLN: 250.0000 mg | INTRAVENOUS | Status: DC

## 2017-05-04 NOTE — Progress Notes (Signed)
RN called to room due to patient complaint of shortness of breath and back pain. Patient found to be on 2L oxygen and saturation of 91%. Dr. Darvin Neighbours notified and ordered Stat chest xray and duoneb. Tramadol given for back pain and duoneb given. Breathing improved. Patient transported to xray via [employer] officer, [employer].

## 2017-05-04 NOTE — Progress Notes (Signed)
Dr.  Darvin Neighbours saw patient and ordered breathing treatments scheduled and steroids. Patient eturned from xray lethargic and BP 84/49 and 82/60. Patient also complaining of headache. MD notified and NS bolus and ABG ordered. Will continue to monitor.

## 2017-05-04 NOTE — Discharge Instructions (Addendum)
Resume diet and activity as before.  You can resume your Aspirin after 1 week.  Please follow up with your doctor in 1 week for repeat blood work for anemia and Potassium levels.  Please call your return to ED if you see any black stools like before, or blood in stools

## 2017-05-04 NOTE — Progress Notes (Signed)
[place] [facility] [place] NAME: [name]    MR#:  [id number]  DATE OF BIRTH:  09-07-1946  SUBJECTIVE:  CHIEF COMPLAINT:   Chief Complaint  Patient presents with  . Abdominal Pain  . Shortness of Breath  . Nausea   Melena improved. No abd pain Mild drop in hb  REVIEW OF SYSTEMS:    Review of Systems  Constitutional: Positive for malaise/fatigue. Negative for chills and fever.  HENT: Negative for sore throat.   Eyes: Negative for blurred vision, double vision and pain.  Respiratory: Positive for cough. Negative for hemoptysis, shortness of breath and wheezing.   Cardiovascular: Negative for chest pain, palpitations, orthopnea and leg swelling.  Gastrointestinal: Positive for melena. Negative for abdominal pain, constipation, diarrhea, heartburn, nausea and vomiting.  Genitourinary: Negative for dysuria and hematuria.  Musculoskeletal: Negative for back pain and joint pain.  Skin: Negative for rash.  Neurological: Positive for weakness. Negative for sensory change, speech change, focal weakness and headaches.  Endo/Heme/Allergies: Does not bruise/bleed easily.  Psychiatric/Behavioral: Negative for depression. The patient is not nervous/anxious.     DRUG ALLERGIES:   Allergies  Allergen Reactions  . Xarelto [Rivaroxaban] Other (See Comments)    Reaction: GI Bleed  . Clindamycin/Lincomycin Rash  . Dye Fdc Red [Red Dye] Rash  . Lovenox [Enoxaparin Sodium]     Rash, blackness at injection site. Multifactorial and multiple other drugs used at the same time.  [name] Kitchen Penicillins Rash and Other (See Comments)    Has patient had a PCN reaction causing immediate rash, facial/tongue/throat swelling, SOB or lightheadedness with hypotension: Unknown Has patient had a PCN reaction causing severe rash involving mucus membranes or skin necrosis: Unknown Has patient had a PCN reaction that required hospitalization: Unknown Has patient had a PCN reaction  occurring within the last 10 years: No If all of the above answers are "NO", then may proceed with Cephalosporin use.     VITALS:  Blood pressure (!) 81/40, pulse 79, temperature 98.4 F (36.9 C), temperature source Oral, resp. rate 20, height 5\' 8"  (1.727 m), weight 101.5 kg (223 lb 11.2 oz), SpO2 94 %.  PHYSICAL EXAMINATION:   Physical Exam  GENERAL:  71 y.o.-year-old patient lying in the bed with no acute distress.  EYES: Pupils equal, round, reactive to light and accommodation. No scleral icterus. Extraocular muscles intact.  HEENT: Head atraumatic, normocephalic. Oropharynx and nasopharynx clear.  NECK:  Supple, no jugular venous distention. No thyroid enlargement, no tenderness.  LUNGS: Normal breath sounds bilaterally, no wheezing, rales, rhonchi. No use of accessory muscles of respiration.  CARDIOVASCULAR: S1, S2 normal. No murmurs, rubs, or gallops.  ABDOMEN: Soft, nontender, nondistended. Bowel sounds present. No organomegaly or mass.  EXTREMITIES: No cyanosis, clubbing or edema b/l.    NEUROLOGIC: Cranial nerves II through XII are intact. No focal Motor or sensory deficits b/l.   PSYCHIATRIC: The patient is alert and oriented x 3.  SKIN: No obvious rash, lesion, or ulcer.   LABORATORY PANEL:   CBC  Recent Labs Lab 05/04/17 1337  WBC 20.9*  HGB 7.0*  HCT 20.5*  PLT 199   ------------------------------------------------------------------------------------------------------------------ Chemistries   Recent Labs Lab 05/04/17 1337  NA 132*  K 5.2*  CL 104  CO2 21*  GLUCOSE 173*  BUN 40*  CREATININE 1.98*  CALCIUM 8.1*  AST 28  ALT 21  ALKPHOS 59  BILITOT 1.1   ------------------------------------------------------------------------------------------------------------------  Cardiac Enzymes  Recent Labs Lab 05/02/17 1945  TROPONINI <0.03    ------------------------------------------------------------------------------------------------------------------  RADIOLOGY:  Dg Chest 2 View  Result Date: 05/04/2017 CLINICAL DATA:  Abdominal pain . EXAM: CHEST  2 VIEW COMPARISON:  No prior . FINDINGS: Mediastinum hilar structures normal. Cardiomegaly with normal pulmonary vascularity. Low lung volumes with mild basilar atelectasis. No pleural effusion or pneumothorax. Mild basal pleural-parenchymal thickening consistent with scarring. IMPRESSION: Low lung volumes with mild basilar atelectasis. Mild basal pleural-parenchymal thickening consistent with scarring. No acute pulmonary disease. Electronically Signed   By: Marcello Moores  Register   On: 05/04/2017 12:17   Ct Abdomen Pelvis W Contrast  Result Date: 05/02/2017 CLINICAL DATA:  Periumbilical pain, nausea and vomiting x2 days. EXAM: CT ABDOMEN AND PELVIS WITH CONTRAST TECHNIQUE: Multidetector CT imaging of the abdomen and pelvis was performed using the standard protocol following bolus administration of intravenous contrast. CONTRAST:  150mL ISOVUE-300 IOPAMIDOL (ISOVUE-300) INJECTION 61% COMPARISON:  100 cc Isovue-300 IV FINDINGS: Lower chest: Tiny 2 mm left lower lobe pulmonary nodule. No pneumonic consolidation, effusion or pneumothorax. Heart is normal in size with three-vessel coronary arteriosclerosis. No pericardial effusion. Aortic atherosclerosis without aneurysm or dissection. Hepatobiliary: Status post cholecystectomy. Homogeneous enhancement of the liver. No biliary dilatation. Pancreas: Unremarkable. No pancreatic ductal dilatation or surrounding inflammatory changes. Spleen: Normal in size without focal abnormality. Adrenals/Urinary Tract: Normal bilateral adrenal glands. 4-5 mm circumscribed hypodensities within both kidneys consistent with cysts but are too small to further characterize. Punctate left lower pole renal calculus. No obstructive uropathy. Unremarkable bladder. Stomach/Bowel:  The stomach is distended with oral contrast. There is a small hiatal hernia. There is normal small bowel rotation without obstruction or inflammation. Appendix is normal. A moderate amount of fecal retention is seen within large bowel. No acute large bowel inflammation. There is scattered left-sided colonic diverticulosis. Vascular/Lymphatic: Dense aorto iliac atherosclerosis without aneurysm. No occlusion of the branch vessels. No adenopathy. Reproductive: Normal size prostate and seminal vesicles. Other: No abdominal wall hernia or abnormality. No abdominopelvic ascites. Musculoskeletal: Small fat containing right-sided inguinal hernia as before. Surgical clips in the left inguinal region. Chronic L2 compression with approximately 60% height loss unchanged in appearance. Mild superior endplate compression of T12 is likewise stable. Multilevel degenerative disc disease along the thoracolumbar spine. IMPRESSION: 1. Colonic diverticulosis without acute diverticulitis. 2. Normal-appearing appendix. No bowel obstruction. Moderate colonic stool burden. 3. Aortoiliac atherosclerosis without aneurysm. Three-vessel coronary arteriosclerosis. 4. Nonobstructing punctate left lower pole renal calculus with subcentimeter cysts within both kidneys, too small to further characterize. 5. Cholecystectomy without complications. Electronically Signed   By: Ashley Royalty M.D.   On: 05/02/2017 21:40     ASSESSMENT AND PLAN:   * Gastric and duodenal ulcers with upper GI bleed and melena. Acute blood loss anemia. Significant drop in hemoglobin which seems to be stabilizing at this time. Stop protonic strip. Continue twice a day Protonix. Repeat hemoglobin later. Discussed with Dr. wall of GI.  * Hypertension. On amlodipine and lisinopril.  DVT prophylaxis with SCDs  All the records are reviewed and case discussed with Care Management/Social Workerr. Management plans discussed with the patient, family and they are in  agreement.  CODE STATUS: FULL CODE  TOTAL TIME TAKING CARE OF THIS PATIENT: 30 minutes.   POSSIBLE D/C IN 1-2 DAYS, DEPENDING ON CLINICAL CONDITION.  Hillary Bow R M.D on 05/04/2017 at 3:16 PM  Between 7am to 6pm - Pager - [phone]  After 6pm go to [email or web address] - password [email or web address] [facility]  Office  [phone]  CC: Primary  care physician; [name], MD  Note: This dictation was prepared with Dragon dictation along with smaller phrase technology. Any transcriptional errors that result from this process are unintentional.

## 2017-05-04 NOTE — Progress Notes (Signed)
Pharmacy Antibiotic Note  [name] is a 71 y.o. male admitted on 05/02/2017 with hypoxia and wheezing. Patient is being treated for possible COPD exacerbation.  Pharmacy has been consulted for levofloxacin dosing. Patient transferred from to ICU with hypoxia and wheezing. Patient has not received antibiotics this admission.   Plan: Will start patient levofloxacin 500mg  IV x1 followed by levofloxacin 250mg  IV Q24hr.   Height: 5\' 8"  (172.7 cm) Weight: 223 lb 11.2 oz (101.5 kg) IBW/kg (Calculated) : 68.4  Temp (24hrs), Avg:98.6 F (37 C), Min:98.2 F (36.8 C), Max:99.1 F (37.3 C)   Recent Labs Lab 05/02/17 1945 05/03/17 0739 05/04/17 1337  WBC 7.6 9.7 20.9*  CREATININE 0.97 1.36* 1.98*  LATICACIDVEN  --   --  3.1*    Estimated Creatinine Clearance: 40.6 mL/min (A) (by C-G formula based on SCr of 1.98 mg/dL (H)).    Allergies  Allergen Reactions  . Xarelto [Rivaroxaban] Other (See Comments)    Reaction: GI Bleed  . Clindamycin/Lincomycin Rash  . Dye Fdc Red [Red Dye] Rash  . Lovenox [Enoxaparin Sodium]     Rash, blackness at injection site. Multifactorial and multiple other drugs used at the same time.  [name] Kitchen Penicillins Rash and Other (See Comments)    Has patient had a PCN reaction causing immediate rash, facial/tongue/throat swelling, SOB or lightheadedness with hypotension: Unknown Has patient had a PCN reaction causing severe rash involving mucus membranes or skin necrosis: Unknown Has patient had a PCN reaction that required hospitalization: Unknown Has patient had a PCN reaction occurring within the last 10 years: No If all of the above answers are "NO", then may proceed with Cephalosporin use.     Antimicrobials this admission: Levofloxacin 6/5 >>   Dose adjustments this admission: N/A  Microbiology results: 6/5 MRSA PCR: pending  6/5 Procalcitonin: pending   Thank you for allowing pharmacy to be a part of this patient's care.  Othon Guardia L 05/04/2017  3:34 PM

## 2017-05-04 NOTE — Progress Notes (Signed)
Paged by nurse at 11:30 regarding patient having hypoxia and some wheezing. Saturations down to 85% on room air. Stat DuoNeb and chest x-ray ordered. On assessing patient he had bilateral wheezing and poor air entry. He does have history of COPD. Discussed with patient and wife at bedside regarding COPD exacerbation. Possibility of some aspiration during EGD. Solu-Medrol started. Plan was to monitor patient overnight and wean off oxygen as tolerated.  Patient continued to worsen slowly. He was more drowsy and more short of breath needing more oxygen. ABG was checked and did not show any significant hypercarbia. Ativan which was ordered initially for anxiety was held.  Patient started having hypotension with systolic blood pressure in the 80s. IV fluid 500 ML normal saline bolus ordered. Repeat lactic acid, CBC and CMP checked.  No response to IV fluid bolus and blood pressure still continued and 80s. Hemoglobin returned at 7. 2 units packed RBC ordered. Abdomen normal saline bolus. Lactic acid elevated at 3.  Chest x-ray did not show any pneumonia. He did have fever 100.4 on 05/03/2017. We'll treat this as sepsis and start IV Levaquin for possible pulmonary etiology.  Due to hypotension, sepsis, further drop in hemoglobin at discussed the case with Dr. Allen Norris of GI and Dr. Mortimer Fries with ICU. We'll transfer patient to stepdown area.  Discussed with patient and wife and explained critical illness, treatment plan.  Total critical care time spent 80 minutes

## 2017-05-04 NOTE — Progress Notes (Signed)
Patient transpoted to ICU escorted by RN and NT.
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
## 2017-05-04 NOTE — Progress Notes (Signed)
Lactic Acid 3.1 MD notified 

## 2017-05-04 NOTE — Consult Note (Signed)
Name: [name] MRN: [id number] DOB: [birth year]     CONSULTATION DATE: 05/02/2017  REFERRING MD : Darvin Neighbours  CHIEF COMPLAINT:  lethargic   HISTORY OF PRESENT ILLNESS:  71 yo white male admitted to ICU/step down for increased lethargy and shock initially admitted for melena Found to have 2 ulcers(gastric,duodenal) hgb was 15 down to 11 then decreased down to 7 Patient lethargic, but arousable BP is low Creatinine elevated On minimal oxygen Patient noted to have had wheezing-was given steroids and BD therapy   PAST MEDICAL HISTORY :   has a past medical history of Aortic transection (2005); Basal cell carcinoma; CAD (coronary artery disease); Depression; Diabetes mellitus; Erectile dysfunction; Fall (2005); Fracture of femoral neck, left ([place]) (08/09/2014); Fracture of radial neck, left, closed (08/09/2014); Gallstone pancreatitis; Gastric ulcer; History of ATN (08/09/2014); History of bleeding ulcers; Hyperlipidemia; Hypertension; MI (myocardial infarction) ([place]) (1992); and Peripheral vascular disease ([place]).  has a past surgical history that includes Cholecystectomy; Cardiovascular surgery; Knee surgery; Thoracotomy; Tracheostomy; Cardiac catheterization (08/1991); ORIF hip fracture (Left, 08/12/2014); and Esophagogastroduodenoscopy (egd) with propofol (N/A, 05/03/2017). Prior to Admission medications   Medication Sig Start Date End Date Taking? Authorizing Provider  amLODipine (NORVASC) 5 MG tablet Take 5 mg by mouth daily.   Yes [provider]  aspirin 81 MG tablet Take 81 mg by mouth daily.   Yes [provider]  atorvastatin (LIPITOR) 40 MG tablet Take 1 tablet (40 mg total) by mouth daily. 04/24/17  Yes Copland, Frederico Hamman, MD  citalopram (CELEXA) 20 MG tablet TAKE ONE TABLET EVERY DAY 12/23/16  Yes Copland, Frederico Hamman, MD  lisinopril (PRINIVIL,ZESTRIL) 10 MG tablet TAKE ONE TABLET EVERY DAY 10/14/16  Yes Copland, Frederico Hamman, MD  metFORMIN (GLUCOPHAGE-XR) 500 MG 24 hr tablet  TAKE ONE TABLET BY MOUTH EVERY DAY 04/19/17  Yes Copland, Frederico Hamman, MD  Tiotropium Bromide-Olodaterol (STIOLTO RESPIMAT) 2.5-2.5 MCG/ACT AERS Inhale 2 puffs into the lungs daily. 03/22/17  Yes Parrett, Tammy S, NP  pantoprazole (PROTONIX) 40 MG tablet Take 1 tablet (40 mg total) by mouth daily. 05/04/17   Hillary Bow, MD   Allergies  Allergen Reactions  . Xarelto [Rivaroxaban] Other (See Comments)    Reaction: GI Bleed  . Clindamycin/Lincomycin Rash  . Dye Fdc Red [Red Dye] Rash  . Lovenox [Enoxaparin Sodium]     Rash, blackness at injection site. Multifactorial and multiple other drugs used at the same time.  [name] Kitchen Penicillins Rash and Other (See Comments)    Has patient had a PCN reaction causing immediate rash, facial/tongue/throat swelling, SOB or lightheadedness with hypotension: Unknown Has patient had a PCN reaction causing severe rash involving mucus membranes or skin necrosis: Unknown Has patient had a PCN reaction that required hospitalization: Unknown Has patient had a PCN reaction occurring within the last 10 years: No If all of the above answers are "NO", then may proceed with Cephalosporin use.     FAMILY HISTORY:  family history includes Dementia (age of onset: 6) in his mother; Diabetes (age of onset: 20) in his brother; Heart attack in his brother; Heart attack (age of onset: 45) in his father. SOCIAL HISTORY:  reports that he quit smoking about 25 years ago. His smoking use included Cigarettes. He has a 30.00 pack-year smoking history. He has never used smokeless tobacco. He reports that he drinks alcohol. He reports that he does not use drugs.  REVIEW OF SYSTEMS:   Constitutional: + malaise/fatigue +weakness/lethargy HENT: Negative for hearing loss, ear pain, nosebleeds, congestion, sore throat, neck  pain, tinnitus and ear discharge.   Eyes: Negative for blurred vision, double vision, photophobia, pain, discharge and redness.  Respiratory: Negative for cough, hemoptysis,  sputum production, shortness of breath, wheezing and stridor.   Cardiovascular: Negative for chest pain, palpitations, orthopnea, claudication, leg swelling and PND.  Gastrointestinal: Negative for heartburn, nausea, vomiting, abdominal pain, diarrhea, constipation, blood in stool and melena.  Genitourinary: Negative for dysuria, urgency, frequency, hematuria and flank pain.  Musculoskeletal: Negative for myalgias, back pain, joint pain and falls.  Skin: Negative for itching and rash.  Neurological: Negative for dizziness, tingling, tremors, sensory change, speech change, focal weakness, seizures, loss of consciousness, weakness and headaches.  Endo/Heme/Allergies: Negative for environmental allergies and polydipsia. Does not bruise/bleed easily.  ALL OTHER ROS ARE NEGATIVE    VITAL SIGNS: Temp:  [98.1 F (36.7 C)-99.1 F (37.3 C)] 98.1 F (36.7 C) (06/05 1544) Pulse Rate:  [77-92] 79 (06/05 1545) Resp:  [17-22] 17 (06/05 1545) BP: (80-139)/(40-121) 85/47 (06/05 1545) SpO2:  [88 %-96 %] 95 % (06/05 1545) FiO2 (%):  [28 %-32 %] 32 % (06/05 1509)  Physical Examination:   GENERAL:NAD, no fevers, chills, looks ill HEAD: Normocephalic, atraumatic.  EYES: Pupils equal, round, reactive to light. Extraocular muscles intact. No scleral icterus.  MOUTH: Moist mucosal membrane.   EAR, NOSE, THROAT: Clear without exudates. No external lesions.  NECK: Supple. No thyromegaly. No nodules. No JVD.  PULMONARY:CTA B/L no wheezes, no crackles, no rhonchi CARDIOVASCULAR: S1 and S2. Regular rate and rhythm. No murmurs, rubs, or gallops. No edema.  GASTROINTESTINAL: Soft, nontender, nondistended. No masses. Positive bowel sounds.  MUSCULOSKELETAL: No swelling, clubbing, or edema. Range of motion full in all extremities.  NEUROLOGIC: Cranial nerves II through XII are intact. No gross focal neurological deficits.  SKIN: No ulceration, lesions, rashes, or cyanosis. Skin warm and dry. Turgor intact.    PSYCHIATRIC: Mood, affect within normal limits. The patient is awake, alert and oriented x 3. Insight, judgment intact.       Recent Labs Lab 05/02/17 1945 05/03/17 0739 05/04/17 1337  NA 134* 135 132*  K 5.2* 5.2* 5.2*  CL 105 106 104  CO2 22 20* 21*  BUN 46* 51* 40*  CREATININE 0.97 1.36* 1.98*  GLUCOSE 137* 174* 173*    Recent Labs Lab 05/02/17 1945  05/03/17 0739 05/03/17 1819 05/04/17 0413 05/04/17 1337  HGB 11.1*  < > 8.7* 8.3* 8.0* 7.0*  HCT 31.0*  --  24.4*  --   --  20.5*  WBC 7.6  --  9.7  --   --  20.9*  PLT 225  --  186  --   --  199  < > = values in this interval not displayed. Dg Chest 2 View  Result Date: 05/04/2017 CLINICAL DATA:  Abdominal pain . EXAM: CHEST  2 VIEW COMPARISON:  No prior . FINDINGS: Mediastinum hilar structures normal. Cardiomegaly with normal pulmonary vascularity. Low lung volumes with mild basilar atelectasis. No pleural effusion or pneumothorax. Mild basal pleural-parenchymal thickening consistent with scarring. IMPRESSION: Low lung volumes with mild basilar atelectasis. Mild basal pleural-parenchymal thickening consistent with scarring. No acute pulmonary disease. Electronically Signed   By: Marcello Moores  Register   On: 05/04/2017 12:17   Ct Abdomen Pelvis W Contrast  Result Date: 05/02/2017 CLINICAL DATA:  Periumbilical pain, nausea and vomiting x2 days. EXAM: CT ABDOMEN AND PELVIS WITH CONTRAST TECHNIQUE: Multidetector CT imaging of the abdomen and pelvis was performed using the standard protocol following bolus administration of intravenous  contrast. CONTRAST:  185mL ISOVUE-300 IOPAMIDOL (ISOVUE-300) INJECTION 61% COMPARISON:  100 cc Isovue-300 IV FINDINGS: Lower chest: Tiny 2 mm left lower lobe pulmonary nodule. No pneumonic consolidation, effusion or pneumothorax. Heart is normal in size with three-vessel coronary arteriosclerosis. No pericardial effusion. Aortic atherosclerosis without aneurysm or dissection. Hepatobiliary: Status post  cholecystectomy. Homogeneous enhancement of the liver. No biliary dilatation. Pancreas: Unremarkable. No pancreatic ductal dilatation or surrounding inflammatory changes. Spleen: Normal in size without focal abnormality. Adrenals/Urinary Tract: Normal bilateral adrenal glands. 4-5 mm circumscribed hypodensities within both kidneys consistent with cysts but are too small to further characterize. Punctate left lower pole renal calculus. No obstructive uropathy. Unremarkable bladder. Stomach/Bowel: The stomach is distended with oral contrast. There is a small hiatal hernia. There is normal small bowel rotation without obstruction or inflammation. Appendix is normal. A moderate amount of fecal retention is seen within large bowel. No acute large bowel inflammation. There is scattered left-sided colonic diverticulosis. Vascular/Lymphatic: Dense aorto iliac atherosclerosis without aneurysm. No occlusion of the branch vessels. No adenopathy. Reproductive: Normal size prostate and seminal vesicles. Other: No abdominal wall hernia or abnormality. No abdominopelvic ascites. Musculoskeletal: Small fat containing right-sided inguinal hernia as before. Surgical clips in the left inguinal region. Chronic L2 compression with approximately 60% height loss unchanged in appearance. Mild superior endplate compression of T12 is likewise stable. Multilevel degenerative disc disease along the thoracolumbar spine. IMPRESSION: 1. Colonic diverticulosis without acute diverticulitis. 2. Normal-appearing appendix. No bowel obstruction. Moderate colonic stool burden. 3. Aortoiliac atherosclerosis without aneurysm. Three-vessel coronary arteriosclerosis. 4. Nonobstructing punctate left lower pole renal calculus with subcentimeter cysts within both kidneys, too small to further characterize. 5. Cholecystectomy without complications. Electronically Signed   By: Ashley Royalty M.D.   On: 05/02/2017 21:40   CXR 05/04/2017 I have Independently  reviewed images    on 05/04/2017 Interpretation:Low lung volumes with atelectasis.   ASSESSMENT / PLAN: 71 yo white male with acute shock from active GIB in setting of COPD exacerbation and acute renal failure  1.will give IVF's, vasopressors with neo if needed 2.will need PRBC's 2 units ordered 3.start PPI BID 4.continue ICU/Step down monitoring 5.assess for CVL    Corrin Parker, M.D.  [facility]  Medical Director [place] Director [facility] Cardio-Pulmonary Department

## 2017-05-04 NOTE — Progress Notes (Signed)
Elevated latic acid of 4.3 reported to [place], NP. Will start 500 bolus NS.

## 2017-05-04 NOTE — Progress Notes (Signed)
1540 Transferred from floor via bed. Patient is lethargic. B/P 80/42. Finishing NS 500 ml. bolous. Cardiac monitor showed  rate sinus brady. Follows commands slowly. Dr. Mortimer Fries in to see patient, orders received. After 2nd IV found 1st unit of PRBCs started at 1620. 3rd NS bolous started at 1640. Phenylephedrine started per orders at 1645.1800 More alert. Wife and son in with patient. [name]  Joking and talking to staff. Given ice chips and sips of fluid. Swallows without difficulty.

## 2017-05-04 NOTE — Progress Notes (Signed)
Bolus complete. BP 81/40. 89% on 2L. Increased to 3L O2. Dr. Darvin Neighbours notified. 2nd bolus hung per MD order.  HB 7.0. MD order to transfer pt to ICU. Bed request sent. Awaiting to transfer.

## 2017-05-04 NOTE — Progress Notes (Signed)
Colton Lame, MD [facility]   [address], [place], [place] [address] Phone: [phone] Fax : [phone]   Subjective: This patient was admitted to the ICU for hypotension.  The patient had a EGD with no source of any bleeding seen.  There was some small ulcers in the duodenum and stomach.  The patient has not had any black stool since yesterday morning.  The patient was found to have a drop in his hemoglobin to 7.  The patient is being transfused.  Of note is the patient's white cell count had risen to 20.9 from normal previously.   Objective: Vital signs in last 24 hours: Vitals:   05/04/17 1815 05/04/17 1830 05/04/17 1922 05/04/17 1941  BP: (!) 97/51 (!) 93/59 (!) 83/49 (!) 91/57  Pulse: 87 76 79 74  Resp: 17 15 14 13   Temp:  98.3 F (36.8 C) 97.6 F (36.4 C) 98.1 F (36.7 C)  TempSrc:   Oral Oral  SpO2: 97% 96% 95% 96%  Weight:      Height:       Weight change:   Intake/Output Summary (Last 24 hours) at 05/04/17 1947 Last data filed at 05/04/17 1825  Gross per 24 hour  Intake             1544 ml  Output              600 ml  Net              944 ml     Exam: Heart:: Regular rate and rhythm, S1S2 present or without murmur or extra heart sounds Lungs: normal and clear to auscultation and percussion Abdomen: soft, nontender, normal bowel sounds   Lab Results: @LABTEST2 @ Micro Results: Recent Results (from the past 240 hour(s))  MRSA PCR Screening     Status: None   Collection Time: 05/04/17  3:37 PM  Result Value Ref Range Status   MRSA by PCR NEGATIVE NEGATIVE Final    Comment:        The GeneXpert MRSA Assay (FDA approved for NASAL specimens only), is one component of a comprehensive MRSA colonization surveillance program. It is not intended to diagnose MRSA infection nor to guide or monitor treatment for MRSA infections.    Studies/Results: Dg Chest 2 View  Result Date: 05/04/2017 CLINICAL DATA:  Abdominal pain . EXAM: CHEST  2 VIEW  COMPARISON:  No prior . FINDINGS: Mediastinum hilar structures normal. Cardiomegaly with normal pulmonary vascularity. Low lung volumes with mild basilar atelectasis. No pleural effusion or pneumothorax. Mild basal pleural-parenchymal thickening consistent with scarring. IMPRESSION: Low lung volumes with mild basilar atelectasis. Mild basal pleural-parenchymal thickening consistent with scarring. No acute pulmonary disease. Electronically Signed   By: Marcello Moores  Register   On: 05/04/2017 12:17   Ct Abdomen Pelvis W Contrast  Result Date: 05/02/2017 CLINICAL DATA:  Periumbilical pain, nausea and vomiting x2 days. EXAM: CT ABDOMEN AND PELVIS WITH CONTRAST TECHNIQUE: Multidetector CT imaging of the abdomen and pelvis was performed using the standard protocol following bolus administration of intravenous contrast. CONTRAST:  158mL ISOVUE-300 IOPAMIDOL (ISOVUE-300) INJECTION 61% COMPARISON:  100 cc Isovue-300 IV FINDINGS: Lower chest: Tiny 2 mm left lower lobe pulmonary nodule. No pneumonic consolidation, effusion or pneumothorax. Heart is normal in size with three-vessel coronary arteriosclerosis. No pericardial effusion. Aortic atherosclerosis without aneurysm or dissection. Hepatobiliary: Status post cholecystectomy. Homogeneous enhancement of the liver. No biliary dilatation. Pancreas: Unremarkable. No pancreatic ductal dilatation or surrounding inflammatory changes. Spleen: Normal in size  without focal abnormality. Adrenals/Urinary Tract: Normal bilateral adrenal glands. 4-5 mm circumscribed hypodensities within both kidneys consistent with cysts but are too small to further characterize. Punctate left lower pole renal calculus. No obstructive uropathy. Unremarkable bladder. Stomach/Bowel: The stomach is distended with oral contrast. There is a small hiatal hernia. There is normal small bowel rotation without obstruction or inflammation. Appendix is normal. A moderate amount of fecal retention is seen within large  bowel. No acute large bowel inflammation. There is scattered left-sided colonic diverticulosis. Vascular/Lymphatic: Dense aorto iliac atherosclerosis without aneurysm. No occlusion of the branch vessels. No adenopathy. Reproductive: Normal size prostate and seminal vesicles. Other: No abdominal wall hernia or abnormality. No abdominopelvic ascites. Musculoskeletal: Small fat containing right-sided inguinal hernia as before. Surgical clips in the left inguinal region. Chronic L2 compression with approximately 60% height loss unchanged in appearance. Mild superior endplate compression of T12 is likewise stable. Multilevel degenerative disc disease along the thoracolumbar spine. IMPRESSION: 1. Colonic diverticulosis without acute diverticulitis. 2. Normal-appearing appendix. No bowel obstruction. Moderate colonic stool burden. 3. Aortoiliac atherosclerosis without aneurysm. Three-vessel coronary arteriosclerosis. 4. Nonobstructing punctate left lower pole renal calculus with subcentimeter cysts within both kidneys, too small to further characterize. 5. Cholecystectomy without complications. Electronically Signed   By: Ashley Royalty M.D.   On: 05/02/2017 21:40   Medications: I have reviewed the patient's current medications. Scheduled Meds: . atorvastatin  40 mg Oral q1800  . citalopram  20 mg Oral Daily  . insulin aspart  0-9 Units Subcutaneous TID WC  . ipratropium-albuterol  3 mL Nebulization Q4H  . [START ON 05/05/2017] methylPREDNISolone (SOLU-MEDROL) injection  40 mg Intravenous Daily  . [START ON 05/06/2017] pantoprazole  40 mg Intravenous Q12H  . [START ON 05/06/2017] umeclidinium bromide  1 puff Inhalation Daily   Continuous Infusions: . sodium chloride 50 mL/hr at 05/04/17 1939  . phenylephrine (NEO-SYNEPHRINE) Adult infusion 25 mcg/min (05/04/17 1940)  . sodium chloride     PRN Meds:.acetaminophen **OR** acetaminophen, ALPRAZolam, ondansetron **OR** [DISCONTINUED] ondansetron (ZOFRAN) IV,  traMADol   Assessment: Principal Problem:   Melena Active Problems:   DM (diabetes mellitus) type II, controlled, with peripheral vascular disorder (HCC)   Hyperlipidemia LDL goal <70   Essential hypertension   Coronary artery disease involving native coronary artery without angina pectoris   Peripheral vascular disease (HCC)   Acute gastric ulcer without hemorrhage or perforation   Duodenal ulcer without hemorrhage or perforation    Plan: This patient dropped his hemoglobin despite no further signs of any active bleeding.  The patient had an upper endoscopy with the scope advanced as far down the duodenum as possible.  There is no sign of any fresh blood or old blood.  There were 2 healed ulcers seen 1 in the duodenum and one in the stomach.  The patient may have a small bowel bleed but it may be slow since he is not having any further melanotic stools.  I recommend treating the patient for his elevated white cell count and the cause of that is likely the cause of his hypotension and not from blood loss.  The patient's wife has been explained the plan and agrees with it.   LOS: 2 days   [name] 05/04/2017, 7:47 PM

## 2017-05-04 NOTE — Progress Notes (Signed)
BP 139/121 and patient anxious. MD notified and to come see patient. Will continue to monitor when patient returns from xray.

## 2017-05-05 LAB — TYPE AND SCREEN
ABO/RH(D): O POS
Antibody Screen: NEGATIVE
Unit division: 0
Unit division: 0

## 2017-05-05 LAB — BPAM RBC
Blood Product Expiration Date: 201806212359
Blood Product Expiration Date: 201806252359
ISSUE DATE / TIME: 201806051603
ISSUE DATE / TIME: 201806051907
Unit Type and Rh: 5100
Unit Type and Rh: 5100

## 2017-05-05 LAB — BASIC METABOLIC PANEL WITH GFR
Anion gap: 7 (ref 5–15)
BUN: 42 mg/dL — ABNORMAL HIGH (ref 6–20)
CO2: 19 mmol/L — ABNORMAL LOW (ref 22–32)
Calcium: 7.8 mg/dL — ABNORMAL LOW (ref 8.9–10.3)
Chloride: 107 mmol/L (ref 101–111)
Creatinine, Ser: 1.98 mg/dL — ABNORMAL HIGH (ref 0.61–1.24)
GFR calc Af Amer: 38 mL/min — ABNORMAL LOW
GFR calc non Af Amer: 33 mL/min — ABNORMAL LOW
Glucose, Bld: 229 mg/dL — ABNORMAL HIGH (ref 65–99)
Potassium: 5 mmol/L (ref 3.5–5.1)
Sodium: 133 mmol/L — ABNORMAL LOW (ref 135–145)

## 2017-05-05 LAB — CBC WITH DIFFERENTIAL/PLATELET
Basophils Absolute: 0 10*3/uL (ref 0–0.1)
Basophils Relative: 0 %
Eosinophils Absolute: 0 10*3/uL (ref 0–0.7)
Eosinophils Relative: 0 %
HCT: 25.7 % — ABNORMAL LOW (ref 40.0–52.0)
Hemoglobin: 8.7 g/dL — ABNORMAL LOW (ref 13.0–18.0)
Lymphocytes Relative: 1 %
Lymphs Abs: 0.2 10*3/uL — ABNORMAL LOW (ref 1.0–3.6)
MCH: 29.5 pg (ref 26.0–34.0)
MCHC: 33.9 g/dL (ref 32.0–36.0)
MCV: 87.1 fL (ref 80.0–100.0)
Monocytes Absolute: 0.4 10*3/uL (ref 0.2–1.0)
Monocytes Relative: 2 %
Neutro Abs: 19.1 10*3/uL — ABNORMAL HIGH (ref 1.4–6.5)
Neutrophils Relative %: 97 %
Platelets: 166 10*3/uL (ref 150–440)
RBC: 2.95 MIL/uL — ABNORMAL LOW (ref 4.40–5.90)
RDW: 14.4 % (ref 11.5–14.5)
WBC: 19.7 10*3/uL — ABNORMAL HIGH (ref 3.8–10.6)

## 2017-05-05 LAB — HEMOGLOBIN AND HEMATOCRIT, BLOOD
HCT: 25.5 % — ABNORMAL LOW (ref 40.0–52.0)
Hemoglobin: 8.7 g/dL — ABNORMAL LOW (ref 13.0–18.0)

## 2017-05-05 LAB — GLUCOSE, CAPILLARY
Glucose-Capillary: 232 mg/dL — ABNORMAL HIGH (ref 65–99)
Glucose-Capillary: 172 mg/dL — ABNORMAL HIGH (ref 65–99)
Glucose-Capillary: 220 mg/dL — ABNORMAL HIGH (ref 65–99)
Glucose-Capillary: 145 mg/dL — ABNORMAL HIGH (ref 65–99)

## 2017-05-05 LAB — PROCALCITONIN: Procalcitonin: 0.25 ng/mL

## 2017-05-05 MED ORDER — OXYCODONE HCL 5 MG PO TABS
5.0000 mg | ORAL_TABLET | Freq: Once | ORAL | Status: AC
  Administered 2017-05-05: 5 mg via ORAL
  Filled 2017-05-05: qty 1

## 2017-05-05 MED ORDER — LEVOFLOXACIN 500 MG PO TABS
500.0000 mg | ORAL_TABLET | Freq: Once | ORAL | Status: AC
  Administered 2017-05-05: 500 mg via ORAL
  Filled 2017-05-05: qty 1

## 2017-05-05 MED ORDER — LEVOFLOXACIN 250 MG PO TABS
250.0000 mg | ORAL_TABLET | ORAL | Status: DC
  Administered 2017-05-06: 250 mg via ORAL
  Filled 2017-05-05: qty 1

## 2017-05-05 MED ORDER — PREDNISONE 20 MG PO TABS
40.0000 mg | ORAL_TABLET | Freq: Every day | ORAL | Status: DC
  Administered 2017-05-05 – 2017-05-06 (×2): 40 mg via ORAL
  Filled 2017-05-05 (×2): qty 2

## 2017-05-05 MED ORDER — BENZONATATE 100 MG PO CAPS
100.0000 mg | ORAL_CAPSULE | Freq: Three times a day (TID) | ORAL | Status: DC | PRN
  Administered 2017-05-05: 100 mg via ORAL
  Filled 2017-05-05 (×2): qty 1

## 2017-05-05 MED ORDER — LEVOFLOXACIN 500 MG PO TABS: 500.0000 mg | ORAL_TABLET | Freq: Every day | ORAL | Status: DC

## 2017-05-05 MED ORDER — IPRATROPIUM-ALBUTEROL 0.5-2.5 (3) MG/3ML IN SOLN
3.0000 mL | Freq: Four times a day (QID) | RESPIRATORY_TRACT | Status: DC
  Filled 2017-05-05 (×2): qty 3

## 2017-05-05 NOTE — Progress Notes (Signed)
[place] at [place] NAME: [name]    MR#:  [id number]  DATE OF BIRTH:  [birth year]  SUBJECTIVE:  CHIEF COMPLAINT:   Chief Complaint  Patient presents with  . Abdominal Pain  . Shortness of Breath  . Nausea  No further melena today. No abdominal pain. Shortness of breath improved. Still wheezing. Chest congested. Afebrile. Off pressors. REVIEW OF SYSTEMS:    Review of Systems  Constitutional: Positive for malaise/fatigue. Negative for chills and fever.  HENT: Negative for sore throat.   Eyes: Negative for blurred vision, double vision and pain.  Respiratory: Positive for cough. Negative for hemoptysis, shortness of breath and wheezing.   Cardiovascular: Negative for chest pain, palpitations, orthopnea and leg swelling.  Gastrointestinal: Positive for melena. Negative for abdominal pain, constipation, diarrhea, heartburn, nausea and vomiting.  Genitourinary: Negative for dysuria and hematuria.  Musculoskeletal: Negative for back pain and joint pain.  Skin: Negative for rash.  Neurological: Positive for weakness. Negative for sensory change, speech change, focal weakness and headaches.  Endo/Heme/Allergies: Does not bruise/bleed easily.  Psychiatric/Behavioral: Negative for depression. The patient is not nervous/anxious.     DRUG ALLERGIES:   Allergies  Allergen Reactions  . Xarelto [Rivaroxaban] Other (See Comments)    Reaction: GI Bleed  . Clindamycin/Lincomycin Rash  . Dye Fdc Red [Red Dye] Rash  . Lovenox [Enoxaparin Sodium]     Rash, blackness at injection site. Multifactorial and multiple other drugs used at the same time.  [name] Kitchen Penicillins Rash and Other (See Comments)    Has patient had a PCN reaction causing immediate rash, facial/tongue/throat swelling, SOB or lightheadedness with hypotension: Unknown Has patient had a PCN reaction causing severe rash involving mucus membranes or skin necrosis: Unknown Has patient had a PCN  reaction that required hospitalization: Unknown Has patient had a PCN reaction occurring within the last 10 years: No If all of the above answers are "NO", then may proceed with Cephalosporin use.     VITALS:  Blood pressure 111/61, pulse 85, temperature 97.5 F (36.4 C), resp. rate 18, height 5\' 8"  (1.727 m), weight 101.5 kg (223 lb 11.2 oz), SpO2 90 %.  PHYSICAL EXAMINATION:   Physical Exam  GENERAL:  71 y.o.-year-old patient lying in the bed with no acute distress.  EYES: Pupils equal, round, reactive to light and accommodation. No scleral icterus. Extraocular muscles intact.  HEENT: Head atraumatic, normocephalic. Oropharynx and nasopharynx clear.  NECK:  Supple, no jugular venous distention. No thyroid enlargement, no tenderness.  LUNGS: Good air entry bilaterally with mild wheezing CARDIOVASCULAR: S1, S2 normal. No murmurs, rubs, or gallops.  ABDOMEN: Soft, nontender, nondistended. Bowel sounds present. No organomegaly or mass.  EXTREMITIES: No cyanosis, clubbing or edema b/l.    NEUROLOGIC: Cranial nerves II through XII are intact. No focal Motor or sensory deficits b/l.   PSYCHIATRIC: The patient is alert and oriented x 3.  SKIN: No obvious rash, lesion, or ulcer.   LABORATORY PANEL:   CBC  Recent Labs Lab 05/05/17 0325  WBC 19.7*  HGB 8.7*  HCT 25.7*  PLT 166   ------------------------------------------------------------------------------------------------------------------ Chemistries   Recent Labs Lab 05/04/17 1337 05/05/17 0325  NA 132* 133*  K 5.2* 5.0  CL 104 107  CO2 21* 19*  GLUCOSE 173* 229*  BUN 40* 42*  CREATININE 1.98* 1.98*  CALCIUM 8.1* 7.8*  AST 28  --   ALT 21  --   ALKPHOS 59  --   BILITOT 1.1  --    ------------------------------------------------------------------------------------------------------------------  Cardiac Enzymes  Recent Labs Lab 05/02/17 1945  TROPONINI <0.03    ------------------------------------------------------------------------------------------------------------------  RADIOLOGY:  Dg Chest 2 View  Result Date: 05/04/2017 CLINICAL DATA:  Abdominal pain . EXAM: CHEST  2 VIEW COMPARISON:  No prior . FINDINGS: Mediastinum hilar structures normal. Cardiomegaly with normal pulmonary vascularity. Low lung volumes with mild basilar atelectasis. No pleural effusion or pneumothorax. Mild basal pleural-parenchymal thickening consistent with scarring. IMPRESSION: Low lung volumes with mild basilar atelectasis. Mild basal pleural-parenchymal thickening consistent with scarring. No acute pulmonary disease. Electronically Signed   By: Marcello Moores  Register   On: 05/04/2017 12:17     ASSESSMENT AND PLAN:   * Shock likely hypovolemic or due to sepsis Off pressors. Improved. Hemoglobin stable. On Levaquin for bronchitis. No pneumonia on chest x-ray. Blood cultures pending. Hold blood pressure medications.  * Acute bronchitis with COPD exacerbation and acute hypoxic respiratory failure -IV steroids, Antibiotics - Scheduled Nebulizers - Inhalers -Wean O2 as tolerated  * Diabetes mellitus. Sliding scale insulin. Diabetic diet.   * Gastric and duodenal ulcers with upper GI bleed and melena. Acute blood loss anemia. Significant drop in hemoglobin which seems to be stabilizing at this time.  Continue twice a day Protonix. Monitor hemoglobin May need further investigations depending on hemoglobin trend.  * Hypertension. Held  amlodipine and lisinopril.  DVT prophylaxis with SCDs  All the records are reviewed and case discussed with Care Management/Social Workerr. Management plans discussed with the patient, family and they are in agreement.  CODE STATUS: FULL CODE  TOTAL TIME TAKING CARE OF THIS PATIENT: 30 minutes.   POSSIBLE D/C IN 1-2 DAYS, DEPENDING ON CLINICAL CONDITION.  Hillary Bow R M.D on 05/05/2017 at 1:10 PM  Between 7am to 6pm - Pager -  [phone]  After 6pm go to [email or web address] - password [email or web address] [facility]  Office  [phone]  CC: Primary care physician; [name], MD  Note: This dictation was prepared with Dragon dictation along with smaller phrase technology. Any transcriptional errors that result from this process are unintentional.

## 2017-05-05 NOTE — Progress Notes (Signed)
Inpatient Diabetes Program Recommendations  AACE/ADA: New Consensus Statement on Inpatient Glycemic Control (2015)  Target Ranges:  Prepandial:   less than 140 mg/dL      Peak postprandial:   less than 180 mg/dL (1-2 hours)      Critically ill patients:  140 - 180 mg/dL   Results for QUANTRELL, SPLITT (MRN [id number]) as of 05/05/2017 10:26  Ref. Range 05/04/2017 11:39 05/04/2017 15:27 05/04/2017 21:13 05/04/2017 22:29 05/05/2017 07:34  Glucose-Capillary Latest Ref Range: 65 - 99 mg/dL 158 (H) 155 (H) 285 (H) 271 (H) 232 (H)   Review of Glycemic Control  Diabetes history: DM 2 Outpatient Diabetes medications: Metformin 500 mg Daily Current orders for Inpatient glycemic control: Novolog Sensitive Correction 0-9 units tid  Inpatient Diabetes Program Recommendations:    Glucose in 200's patient received IV Solumedrol 60 mg x1 and now on PO prednisone 40 mg Daily. Consider changing diet to carb modified. Will watch trends on Novolog Sensitive Correction while steroids are being tapered.  Thanks,  Tama Headings RN, MSN, [facility] Inpatient Diabetes Coordinator Team Pager [phone] (8a-5p)

## 2017-05-06 ENCOUNTER — Other Ambulatory Visit: Payer: Self-pay | Admitting: Internal Medicine

## 2017-05-06 DIAGNOSIS — J449 Chronic obstructive pulmonary disease, unspecified: Secondary | ICD-10-CM

## 2017-05-06 LAB — EXPECTORATED SPUTUM ASSESSMENT W GRAM STAIN, RFLX TO RESP C

## 2017-05-06 LAB — GLUCOSE, CAPILLARY
Glucose-Capillary: 152 mg/dL — ABNORMAL HIGH (ref 65–99)
Glucose-Capillary: 157 mg/dL — ABNORMAL HIGH (ref 65–99)
Glucose-Capillary: 222 mg/dL — ABNORMAL HIGH (ref 65–99)
Glucose-Capillary: 186 mg/dL — ABNORMAL HIGH (ref 65–99)

## 2017-05-06 LAB — BASIC METABOLIC PANEL WITH GFR
Anion gap: 6 (ref 5–15)
BUN: 33 mg/dL — ABNORMAL HIGH (ref 6–20)
CO2: 21 mmol/L — ABNORMAL LOW (ref 22–32)
Calcium: 8.3 mg/dL — ABNORMAL LOW (ref 8.9–10.3)
Chloride: 109 mmol/L (ref 101–111)
Creatinine, Ser: 1.24 mg/dL (ref 0.61–1.24)
GFR calc Af Amer: >60 mL/min
GFR calc non Af Amer: 58 mL/min — ABNORMAL LOW
Glucose, Bld: 169 mg/dL — ABNORMAL HIGH (ref 65–99)
Potassium: 5.7 mmol/L — ABNORMAL HIGH (ref 3.5–5.1)
Sodium: 136 mmol/L (ref 135–145)

## 2017-05-06 LAB — CBC
HCT: 24.5 % — ABNORMAL LOW (ref 40.0–52.0)
Hemoglobin: 8.4 g/dL — ABNORMAL LOW (ref 13.0–18.0)
MCH: 29.8 pg (ref 26.0–34.0)
MCHC: 34.5 g/dL (ref 32.0–36.0)
MCV: 86.4 fL (ref 80.0–100.0)
Platelets: 197 10*3/uL (ref 150–440)
RBC: 2.83 MIL/uL — ABNORMAL LOW (ref 4.40–5.90)
RDW: 14.5 % (ref 11.5–14.5)
WBC: 17.7 10*3/uL — ABNORMAL HIGH (ref 3.8–10.6)

## 2017-05-06 MED ORDER — LEVOFLOXACIN 250 MG PO TABS
500.0000 mg | ORAL_TABLET | ORAL | Status: DC
  Administered 2017-05-07 – 2017-05-08 (×2): 500 mg via ORAL
  Filled 2017-05-06 (×2): qty 2

## 2017-05-06 MED ORDER — HYDROCHLOROTHIAZIDE 12.5 MG PO CAPS
12.5000 mg | ORAL_CAPSULE | Freq: Every day | ORAL | Status: DC
  Administered 2017-05-06 – 2017-05-08 (×3): 12.5 mg via ORAL
  Filled 2017-05-06 (×3): qty 1

## 2017-05-06 MED ORDER — FUROSEMIDE 10 MG/ML IJ SOLN
40.0000 mg | Freq: Once | INTRAMUSCULAR | Status: AC
  Administered 2017-05-06: 40 mg via INTRAVENOUS
  Filled 2017-05-06: qty 4

## 2017-05-06 MED ORDER — SODIUM POLYSTYRENE SULFONATE 15 GM/60ML PO SUSP
30.0000 g | Freq: Once | ORAL | Status: AC
  Administered 2017-05-06: 30 g via ORAL
  Filled 2017-05-06: qty 120

## 2017-05-06 MED ORDER — PREDNISONE 5 MG PO TABS
30.0000 mg | ORAL_TABLET | Freq: Two times a day (BID) | ORAL | Status: DC
  Administered 2017-05-06 – 2017-05-08 (×4): 30 mg via ORAL
  Filled 2017-05-06 (×4): qty 1

## 2017-05-06 NOTE — Progress Notes (Signed)
[place] at [place] NAME: [name]    MR#:  [id number]  DATE OF BIRTH:  1946-08-11  SUBJECTIVE:  CHIEF COMPLAINT:   Chief Complaint  Patient presents with  . Abdominal Pain  . Shortness of Breath  . Nausea   No bowel movement since yesterday. No abdominal pain. No shortness of breath. Some wheezing. On 2 L oxygen.  REVIEW OF SYSTEMS:    Review of Systems  Constitutional: Positive for malaise/fatigue. Negative for chills and fever.  HENT: Negative for sore throat.   Eyes: Negative for blurred vision, double vision and pain.  Respiratory: Positive for cough. Negative for hemoptysis, shortness of breath and wheezing.   Cardiovascular: Negative for chest pain, palpitations, orthopnea and leg swelling.  Gastrointestinal: Positive for melena. Negative for abdominal pain, constipation, diarrhea, heartburn, nausea and vomiting.  Genitourinary: Negative for dysuria and hematuria.  Musculoskeletal: Negative for back pain and joint pain.  Skin: Negative for rash.  Neurological: Positive for weakness. Negative for sensory change, speech change, focal weakness and headaches.  Endo/Heme/Allergies: Does not bruise/bleed easily.  Psychiatric/Behavioral: Negative for depression. The patient is not nervous/anxious.     DRUG ALLERGIES:   Allergies  Allergen Reactions  . Xarelto [Rivaroxaban] Other (See Comments)    Reaction: GI Bleed  . Clindamycin/Lincomycin Rash  . Dye Fdc Red [Red Dye] Rash  . Lovenox [Enoxaparin Sodium]     Rash, blackness at injection site. Multifactorial and multiple other drugs used at the same time.  [name] Kitchen Penicillins Rash and Other (See Comments)    Has patient had a PCN reaction causing immediate rash, facial/tongue/throat swelling, SOB or lightheadedness with hypotension: Unknown Has patient had a PCN reaction causing severe rash involving mucus membranes or skin necrosis: Unknown Has patient had a PCN reaction that  required hospitalization: Unknown Has patient had a PCN reaction occurring within the last 10 years: No If all of the above answers are "NO", then may proceed with Cephalosporin use.     VITALS:  Blood pressure 139/61, pulse (!) 101, temperature 97.9 F (36.6 C), temperature source Oral, resp. rate 20, height 5\' 8"  (1.727 m), weight 101.5 kg (223 lb 11.2 oz), SpO2 92 %.  PHYSICAL EXAMINATION:   Physical Exam  GENERAL:  71 y.o.-year-old patient lying in the bed with no acute distress.  EYES: Pupils equal, round, reactive to light and accommodation. No scleral icterus. Extraocular muscles intact.  HEENT: Head atraumatic, normocephalic. Oropharynx and nasopharynx clear.  NECK:  Supple, no jugular venous distention. No thyroid enlargement, no tenderness.  LUNGS: Good air entry bilaterally with mild wheezing CARDIOVASCULAR: S1, S2 normal. No murmurs, rubs, or gallops.  ABDOMEN: Soft, nontender, nondistended. Bowel sounds present. No organomegaly or mass.  EXTREMITIES: No cyanosis, clubbing or edema b/l.    NEUROLOGIC: Cranial nerves II through XII are intact. No focal Motor or sensory deficits b/l.   PSYCHIATRIC: The patient is alert and oriented x 3.  SKIN: No obvious rash, lesion, or ulcer.   LABORATORY PANEL:   CBC  Recent Labs Lab 05/06/17 0532  WBC 17.7*  HGB 8.4*  HCT 24.5*  PLT 197   ------------------------------------------------------------------------------------------------------------------ Chemistries   Recent Labs Lab 05/04/17 1337  05/06/17 0532  NA 132*  < > 136  K 5.2*  < > 5.7*  CL 104  < > 109  CO2 21*  < > 21*  GLUCOSE 173*  < > 169*  BUN 40*  < > 33*  CREATININE 1.98*  < >  1.24  CALCIUM 8.1*  < > 8.3*  AST 28  --   --   ALT 21  --   --   ALKPHOS 59  --   --   BILITOT 1.1  --   --   < > = values in this interval not  displayed. ------------------------------------------------------------------------------------------------------------------  Cardiac Enzymes  Recent Labs Lab 05/02/17 1945  TROPONINI <0.03   ------------------------------------------------------------------------------------------------------------------  RADIOLOGY:  Dg Chest 2 View  Result Date: 05/04/2017 CLINICAL DATA:  Abdominal pain . EXAM: CHEST  2 VIEW COMPARISON:  No prior . FINDINGS: Mediastinum hilar structures normal. Cardiomegaly with normal pulmonary vascularity. Low lung volumes with mild basilar atelectasis. No pleural effusion or pneumothorax. Mild basal pleural-parenchymal thickening consistent with scarring. IMPRESSION: Low lung volumes with mild basilar atelectasis. Mild basal pleural-parenchymal thickening consistent with scarring. No acute pulmonary disease. Electronically Signed   By: Marcello Moores  Register   On: 05/04/2017 12:17     ASSESSMENT AND PLAN:   * Shock likely hypovolemic or due to sepsis Off pressors. Improved. Hemoglobin stable. On Levaquin for bronchitis. No pneumonia on chest x-ray. Blood cultures NGTD.  * Acute bronchitis with COPD exacerbation and acute hypoxic respiratory failure - steroids, Antibiotics - Scheduled Nebulizers - Inhalers -Wean O2 as tolerated  * Diabetes mellitus. Sliding scale insulin. Diabetic diet.  * Gastric and duodenal ulcers with upper GI bleed and melena. Acute blood loss anemia. Significant drop in hemoglobin which seems to be stabilizing at this time.  Continue  Protonix. Monitor hemoglobin May need further investigations depending on hemoglobin trend. Repeat hemoglobin in the morning. Stable for now.  * Hypertension. Held  amlodipine and lisinopril. Will have her discontinue lisinopril at discharge due to hyperkalemia. Will add hydrochlorothiazide  * Hyperkalemia. We'll give 1 dose of Kayexalate stat.  DVT prophylaxis with SCDs  Consult physical therapy.  Possible discharge tomorrow if hemoglobin stable.  All the records are reviewed and case discussed with Care Management/Social Worker Management plans discussed with the patient, family and they are in agreement.  CODE STATUS: FULL CODE  TOTAL TIME TAKING CARE OF THIS PATIENT: 30 minutes.   POSSIBLE D/C IN 1-2 DAYS, DEPENDING ON CLINICAL CONDITION.  Hillary Bow R M.D on 05/06/2017 at 10:57 AM  Between 7am to 6pm - Pager - [phone]  After 6pm go to [email or web address] - password [email or web address] [facility]  Office  [phone]  CC: Primary care physician; [name], MD  Note: This dictation was prepared with Dragon dictation along with smaller phrase technology. Any transcriptional errors that result from this process are unintentional.

## 2017-05-06 NOTE — Progress Notes (Signed)
Orders entered per staff message from Dr. Ashby Dawes.   This is a [facility] patient, currently admitted to [facility], please refer the patient to pulmonary rehabilitation.

## 2017-05-06 NOTE — Progress Notes (Signed)
Discussed with Dr. Alva Garnet. Dr. Ashby Dawes will evaluate patient later today

## 2017-05-06 NOTE — Evaluation (Signed)
Physical Therapy Evaluation Patient Details Name: DAGAN HEINZ MRN: [id number] DOB: Aug 24, 1946 Today's Date: 05/06/2017   History of Present Illness  Pt is a 71 y/o M who presents with melena and reports of epigastric abdominal pain.  Pt found to have acute bronchitis with COPD exacerbation, shock likely hypovolemic or due to sepsis, and gastric and duodenal ulcers with upper GI bleed and melena. Pt's PMH includes aortic transection, basal cell carcinoma, L femoral neck fx s/p ORIF, MI.    Clinical Impression  Pt admitted with above diagnosis. Pt currently with functional limitations due to the deficits listed below (see PT Problem List). Mr. Sane was Ind PTA working full time and ambulating ~2.5-3 miles each day at work.  He currently is limited due to pulmonary function.  Anticipate that once pulmonary function improves, pt will be able to quickly return to PLOF.  SpO2 87-91% on 2L O2 sitting EOB.  This improves to 92% on 2L O2 in standing with cues for upright posture.  Down to 84% on 2L O2 when ambulating short distance in room.  87-90% on 2L O2 sitting comfortably in chair at end of session. Pt performs pursed lip breathing throughout entire session. Recommending OT and Pulmonary Rehab.    Follow Up Recommendations No PT follow up    Equipment Recommendations  None recommended by PT    Recommendations for Other Services OT consult;Other (comment) (OT for energy conservation techniques; Pulmonary Rehab)     Precautions / Restrictions Precautions Precautions: Other (comment) Precaution Comments: Monitor O2 Restrictions Weight Bearing Restrictions: No      Mobility  Bed Mobility Overal bed mobility: Independent             General bed mobility comments: No physical assist or cues needed.  Pt performs independently.  Transfers Overall transfer level: Independent Equipment used: None             General transfer comment: No physical assist or cues needed.  Pt performs  independently and safely.  Ambulation/Gait Ambulation/Gait assistance: Supervision Ambulation Distance (Feet): 15 Feet Assistive device: None Gait Pattern/deviations: Step-through pattern;Decreased stride length;Wide base of support     General Gait Details: Slightly wide BOS but no unsteadiness and no LOB noted.  Supervision for safety.  SpO2 down as low as 84% on 2L O2.  Stairs            Wheelchair Mobility    Modified Rankin (Stroke Patients Only)       Balance Overall balance assessment: No apparent balance deficits (not formally assessed)                                           Pertinent Vitals/Pain Pain Assessment: No/denies pain    Home Living Family/patient expects to be discharged to:: Private residence Living Arrangements: Spouse/significant other Available Help at Discharge: Family;Available PRN/intermittently Type of Home: House         Home Equipment: Walker - 2 wheels      Prior Function Level of Independence: Independent         Comments: Pt working full time "building houses".  He says that he averages walking ~2.5-3 miles each day at work.  His most recent fall was 2 years ago.  Pt ind with all ALDs, IADLs, and all aspects of mobility.     Hand Dominance   Dominant Hand: Right  Extremity/Trunk Assessment   Upper Extremity Assessment Upper Extremity Assessment: Overall WFL for tasks assessed    Lower Extremity Assessment Lower Extremity Assessment: Overall WFL for tasks assessed       Communication   Communication: No difficulties  Cognition Arousal/Alertness: Awake/alert Behavior During Therapy: WFL for tasks assessed/performed Overall Cognitive Status: Within Functional Limits for tasks assessed                                        General Comments General comments (skin integrity, edema, etc.): SpO2 87-91% on 2L O2 sitting EOB.  This improves to 92% on 2L O2 in standing with cues  for upright posture.  Down to 84% on 2L O2 when ambulating short distance in room.  87-90% on 2L O2 sitting comfortably in chair at end of session. Pt performs pursed lip breathing throughout entire session.    Exercises Other Exercises Other Exercises: Seated scapular squeezes x10   Assessment/Plan    PT Assessment Patient needs continued PT services  PT Problem List Cardiopulmonary status limiting activity;Decreased safety awareness       PT Treatment Interventions Gait training;Stair training;Functional mobility training;Therapeutic activities;Therapeutic exercise;Patient/family education;Other (comment) (Energy conservation techniques)    PT Goals (Current goals can be found in the Care Plan section)  Acute Rehab PT Goals Patient Stated Goal: to return to work PT Goal Formulation: With patient Time For Goal Achievement: 05/20/17 Potential to Achieve Goals: Good    Frequency Min 2X/week   Barriers to discharge        Co-evaluation               AM-PAC PT "6 Clicks" Daily Activity  Outcome Measure Difficulty turning over in bed (including adjusting bedclothes, sheets and blankets)?: None Difficulty moving from lying on back to sitting on the side of the bed? : None Difficulty sitting down on and standing up from a chair with arms (e.g., wheelchair, bedside commode, etc,.)?: None Help needed moving to and from a bed to chair (including a wheelchair)?: A Little Help needed walking in hospital room?: A Little Help needed climbing 3-5 steps with a railing? : A Little 6 Click Score: 21    End of Session Equipment Utilized During Treatment: Gait belt;Oxygen Activity Tolerance: Patient tolerated treatment well;Treatment limited secondary to medical complications (Comment) (hypoxia) Patient left: in chair;with call bell/phone within reach;with chair alarm set Nurse Communication: Mobility status;Other (comment) (SpO2) PT Visit Diagnosis: Other abnormalities of gait and  mobility (R26.89)    Time: 1350-1411 PT Time Calculation (min) (ACUTE ONLY): 21 min   Charges:   PT Evaluation $PT Eval Low Complexity: 1 Procedure     PT G CodesCollie Siad PT, DPT 05/06/2017, 2:32 PM

## 2017-05-06 NOTE — Progress Notes (Signed)
* [facility]     Assessment and Plan:  COPD, possible COPD exacerbation. Patient has had a significant deterioration in his functional status with dyspnea on exertion over the past few months. -Mild bibasilar wheezing, will increase steroid. -Empiric dose of Lasix 1. -Recommend pulmonary rehabilitation referral upon discharge, will be arranged by our office.   Gastrointestinal bleeding.  -Stable status post blood products.  Date: 05/06/2017  MRN# [id number] [name] May 21, 1946   [name] is a 71 y.o. old male seen in follow up for chief complaint of  Chief Complaint  Patient presents with  . Abdominal Pain  . Shortness of Breath  . Nausea     HPI:   The patient has a history of COPD, for which he was last seen at [facility] 2 months prior, he had some wheezing at that time, and Anoro sample was added. This seemed to help, therefore was put on stiolto per insurance.   Currently in the hospital he is on incruz and duonebs. Currently, he feels his breathing is okay at rest, however, when he exerts himself. He notes dyspnea. The patient's son, who is a former respiratory therapist is also at the bedside, he notes that his father used to be very active, until a few months ago, he has progressively declined since that time. Upon evaluation today, looks significantly older than stated age, he does not appear to be in respiratory distress.  He was noted to have minimal obstruction on PFT but significant emphysematous changes on imaging. I personally Reviewed both  PFT tracings from 09/17/14; mild showded COPD, normal FEV1 and most recent imaging, which shows apical emphysema and obesity.   He is currently admitted for AECOPD and Gi bleeding. Echo 09/10/16; EF = 55%; no pulmonary hypertension was noted.  Medication:    Current Facility-Administered Medications:  .  acetaminophen (TYLENOL) tablet 650 mg, 650 mg, Oral, Q6H PRN, 650 mg at 05/05/17 0153  **OR** acetaminophen (TYLENOL) suppository 650 mg, 650 mg, Rectal, Q6H PRN, Lance Coon, MD .  ALPRAZolam Duanne Moron) tablet 0.5 mg, 0.5 mg, Oral, BID PRN, Hillary Bow, MD, 0.5 mg at 05/06/17 0858 .  atorvastatin (LIPITOR) tablet 40 mg, 40 mg, Oral, q1800, Hillary Bow, MD, 40 mg at 05/05/17 1809 .  benzonatate (TESSALON) capsule 100 mg, 100 mg, Oral, TID PRN, Hugelmeyer, Alexis, DO, 100 mg at 05/05/17 2245 .  citalopram (CELEXA) tablet 20 mg, 20 mg, Oral, Daily, Lance Coon, MD, 20 mg at 05/06/17 0858 .  hydrochlorothiazide (MICROZIDE) capsule 12.5 mg, 12.5 mg, Oral, Daily, Sudini, Srikar, MD .  insulin aspart (novoLOG) injection 0-9 Units, 0-9 Units, Subcutaneous, TID WC, Hillary Bow, MD, 2 Units at 05/06/17 0857 .  ipratropium-albuterol (DUONEB) 0.5-2.5 (3) MG/3ML nebulizer solution 3 mL, 3 mL, Nebulization, Q6H, Simonds, David B, MD .  [COMPLETED] levofloxacin [facility]) tablet 500 mg, 500 mg, Oral, Once, 500 mg at 05/05/17 1006 **FOLLOWED BY** [START ON 05/07/2017] levofloxacin (LEVAQUIN) tablet 500 mg, 500 mg, Oral, Q24H, Hallaji, Sheema M, RPH .  MEDLINE mouth rinse, 15 mL, Mouth Rinse, BID, Varughese, Bincy S, NP, 15 mL at 05/06/17 1121 .  ondansetron (ZOFRAN) tablet 4 mg, 4 mg, Oral, Q6H PRN **OR** [DISCONTINUED] ondansetron (ZOFRAN) injection 4 mg, 4 mg, Intravenous, Q6H PRN, Lance Coon, MD, 4 mg at 05/03/17 2225 .  pantoprazole (PROTONIX) injection 40 mg, 40 mg, Intravenous, Q12H, Nance Pear, MD, 40 mg at 05/06/17 0857 .  predniSONE (DELTASONE) tablet 40 mg, 40 mg, Oral, Q breakfast,  Wilhelmina Mcardle, MD, 40 mg at 05/06/17 7253 .  sodium chloride 0.9 % bolus 1,000 mL, 1,000 mL, Intravenous, Once, Sudini, Srikar, MD .  traMADol Veatrice Bourbon) tablet 50 mg, 50 mg, Oral, Q6H PRN, Lance Coon, MD, 50 mg at 05/06/17 0902 .  umeclidinium bromide (INCRUSE ELLIPTA) 62.5 MCG/INH 1 puff, 1 puff, Inhalation, Daily, Sudini, Srikar, MD, 1 puff at 05/06/17 1121   Allergies:  Xarelto  [rivaroxaban]; Clindamycin/lincomycin; Dye fdc red [red dye]; Lovenox [enoxaparin sodium]; and Penicillins  Review of Systems: Gen:  Denies  fever, sweats. HEENT: Denies blurred vision. Cvc:  No dizziness, chest pain or heaviness Resp:   Denies cough or sputum porduction. Gi: Denies swallowing difficulty, stomach pain. constipation, bowel incontinence Gu:  Denies bladder incontinence, burning urine Ext:   No Joint pain, stiffness. Skin: No skin rash, easy bruising. Endoc:  No polyuria, polydipsia. Psych: No depression, insomnia. Other:  All other systems were reviewed and found to be negative other than what is mentioned in the HPI.   Physical Examination:   VS: BP (!) 156/71 (BP Location: Right Arm)   Pulse 86   Temp 97.9 F (36.6 C) (Oral)   Resp 16   Ht 5\' 8"  (1.727 m)   Wt 223 lb 11.2 oz (101.5 kg)   SpO2 (!) 89%   BMI 34.01 kg/m    General Appearance: No distress  Neuro:without focal findings,  speech normal,  HEENT: PERRLA, EOM intact. Pulmonary: normal breath sounds,, scattered bibasilar wheezing.   CardiovascularNormal S1,S2.  No m/r/g.   Abdomen: Benign, Soft, non-tender. Renal:  No costovertebral tenderness  GU:  Not performed at this time. Endoc: No evident thyromegaly, no signs of acromegaly. Skin:   warm, no rash. Extremities: normal, no cyanosis, clubbing.   LABORATORY PANEL:   CBC  Recent Labs Lab 05/06/17 0532  WBC 17.7*  HGB 8.4*  HCT 24.5*  PLT 197   ------------------------------------------------------------------------------------------------------------------  Chemistries   Recent Labs Lab 05/04/17 1337  05/06/17 0532  NA 132*  < > 136  K 5.2*  < > 5.7*  CL 104  < > 109  CO2 21*  < > 21*  GLUCOSE 173*  < > 169*  BUN 40*  < > 33*  CREATININE 1.98*  < > 1.24  CALCIUM 8.1*  < > 8.3*  AST 28  --   --   ALT 21  --   --   ALKPHOS 59  --   --   BILITOT 1.1  --   --   < > = values in this interval not  displayed. ------------------------------------------------------------------------------------------------------------------  Cardiac Enzymes  Recent Labs Lab 05/02/17 1945  TROPONINI <0.03   ------------------------------------------------------------  RADIOLOGY:    Results for orders placed during the hospital encounter of 05/02/17  DG Chest 2 View   Narrative CLINICAL DATA:  Abdominal pain .  EXAM: CHEST  2 VIEW  COMPARISON:  No prior .  FINDINGS: Mediastinum hilar structures normal. Cardiomegaly with normal pulmonary vascularity. Low lung volumes with mild basilar atelectasis. No pleural effusion or pneumothorax. Mild basal pleural-parenchymal thickening consistent with scarring.  IMPRESSION: Low lung volumes with mild basilar atelectasis. Mild basal pleural-parenchymal thickening consistent with scarring. No acute pulmonary disease.   Electronically Signed   By: Marcello Moores  Register   On: 05/04/2017 12:17    ------------------------------------------------------------------------------------------------------------------  Thank  you for allowing [facility] to assist in the care of your patient. Our recommendations are noted above.  Please contact us if we can  be of further service.   Marda Stalker, MD.  [facility] Office Number: [phone]  Patricia Pesa, M.D.  Merton Border, M.D  05/06/2017

## 2017-05-06 NOTE — Progress Notes (Signed)
Patient states continues to have what he feels like are side affects from svn txs. States he will call if he feels like he needs one however he declines medication

## 2017-05-06 NOTE — Progress Notes (Signed)
SATURATION QUALIFICATIONS: (This note is used to comply with regulatory documentation for home oxygen)  Patient Saturations on Room Air at Rest = 83%  Patient Saturations on Room Air while Ambulating = 76%  Patient Saturations on 3 Liters of oxygen while Ambulating =88%  Please briefly explain why patient needs home oxygen:

## 2017-05-07 ENCOUNTER — Inpatient Hospital Stay: Payer: PPO

## 2017-05-07 DIAGNOSIS — R042 Hemoptysis: Secondary | ICD-10-CM

## 2017-05-07 DIAGNOSIS — J441 Chronic obstructive pulmonary disease with (acute) exacerbation: Secondary | ICD-10-CM

## 2017-05-07 LAB — CBC WITH DIFFERENTIAL/PLATELET
Basophils Absolute: 0 10*3/uL (ref 0–0.1)
Basophils Relative: 0 %
Eosinophils Absolute: 0 10*3/uL (ref 0–0.7)
Eosinophils Relative: 0 %
HCT: 29.3 % — ABNORMAL LOW (ref 40.0–52.0)
Hemoglobin: 10 g/dL — ABNORMAL LOW (ref 13.0–18.0)
Lymphocytes Relative: 6 %
Lymphs Abs: 0.9 10*3/uL — ABNORMAL LOW (ref 1.0–3.6)
MCH: 29.6 pg (ref 26.0–34.0)
MCHC: 34.1 g/dL (ref 32.0–36.0)
MCV: 86.9 fL (ref 80.0–100.0)
Monocytes Absolute: 0.7 10*3/uL (ref 0.2–1.0)
Monocytes Relative: 5 %
Neutro Abs: 14.5 10*3/uL — ABNORMAL HIGH (ref 1.4–6.5)
Neutrophils Relative %: 89 %
Platelets: 263 10*3/uL (ref 150–440)
RBC: 3.38 MIL/uL — ABNORMAL LOW (ref 4.40–5.90)
RDW: 14.2 % (ref 11.5–14.5)
WBC: 16.2 10*3/uL — ABNORMAL HIGH (ref 3.8–10.6)

## 2017-05-07 LAB — GLUCOSE, CAPILLARY
Glucose-Capillary: 187 mg/dL — ABNORMAL HIGH (ref 65–99)
Glucose-Capillary: 164 mg/dL — ABNORMAL HIGH (ref 65–99)
Glucose-Capillary: 255 mg/dL — ABNORMAL HIGH (ref 65–99)
Glucose-Capillary: 261 mg/dL — ABNORMAL HIGH (ref 65–99)

## 2017-05-07 LAB — BASIC METABOLIC PANEL WITH GFR
Anion gap: 10 (ref 5–15)
BUN: 33 mg/dL — ABNORMAL HIGH (ref 6–20)
CO2: 23 mmol/L (ref 22–32)
Calcium: 8.6 mg/dL — ABNORMAL LOW (ref 8.9–10.3)
Chloride: 105 mmol/L (ref 101–111)
Creatinine, Ser: 1.1 mg/dL (ref 0.61–1.24)
GFR calc Af Amer: >60 mL/min
GFR calc non Af Amer: >60 mL/min
Glucose, Bld: 176 mg/dL — ABNORMAL HIGH (ref 65–99)
Potassium: 4.3 mmol/L (ref 3.5–5.1)
Sodium: 138 mmol/L (ref 135–145)

## 2017-05-07 MED ORDER — FERROUS SULFATE 325 (65 FE) MG PO TBEC: 325.0000 mg | DELAYED_RELEASE_TABLET | Freq: Every day | ORAL | 0 refills | Status: DC

## 2017-05-07 MED ORDER — IPRATROPIUM-ALBUTEROL 0.5-2.5 (3) MG/3ML IN SOLN: 3.0000 mL | RESPIRATORY_TRACT | Status: DC | PRN

## 2017-05-07 MED ORDER — LEVOFLOXACIN 500 MG PO TABS: 500.0000 mg | ORAL_TABLET | ORAL | 0 refills | Status: DC

## 2017-05-07 MED ORDER — PANTOPRAZOLE SODIUM 40 MG PO TBEC: 40.0000 mg | DELAYED_RELEASE_TABLET | Freq: Every day | ORAL | 0 refills | Status: DC

## 2017-05-07 MED ORDER — IPRATROPIUM-ALBUTEROL 0.5-2.5 (3) MG/3ML IN SOLN
3.0000 mL | Freq: Four times a day (QID) | RESPIRATORY_TRACT | Status: DC
  Administered 2017-05-07 – 2017-05-08 (×2): 3 mL via RESPIRATORY_TRACT
  Filled 2017-05-07 (×2): qty 3

## 2017-05-07 MED ORDER — BUDESONIDE 0.25 MG/2ML IN SUSP
0.2500 mg | Freq: Four times a day (QID) | RESPIRATORY_TRACT | Status: DC
  Administered 2017-05-07 – 2017-05-08 (×2): 0.25 mg via RESPIRATORY_TRACT
  Filled 2017-05-07 (×2): qty 2

## 2017-05-07 MED ORDER — PREDNISONE 20 MG PO TABS: 20.0000 mg | ORAL_TABLET | Freq: Two times a day (BID) | ORAL | 0 refills | Status: DC

## 2017-05-07 MED ORDER — HYDROCHLOROTHIAZIDE 12.5 MG PO TABS: 12.5000 mg | ORAL_TABLET | Freq: Every day | ORAL | 0 refills | Status: DC

## 2017-05-07 NOTE — Plan of Care (Signed)
Problem: Tissue Perfusion: Goal: Risk factors for ineffective tissue perfusion will decrease Outcome: Progressing Patient's O2 sats dropped while walking with oxygen, had to increase him to 3L

## 2017-05-07 NOTE — Progress Notes (Signed)
[place] [facility] [place] NAME: [name]    MR#:  [id number]  DATE OF BIRTH:  1946/04/02  SUBJECTIVE:  CHIEF COMPLAINT:   Chief Complaint  Patient presents with  . Abdominal Pain  . Shortness of Breath  . Nausea   On 3 L O2. Some blood in sputum SOB on exertion  REVIEW OF SYSTEMS:    Review of Systems  Constitutional: Positive for malaise/fatigue. Negative for chills and fever.  HENT: Negative for sore throat.   Eyes: Negative for blurred vision, double vision and pain.  Respiratory: Positive for cough. Negative for hemoptysis, shortness of breath and wheezing.   Cardiovascular: Negative for chest pain, palpitations, orthopnea and leg swelling.  Gastrointestinal: Positive for melena. Negative for abdominal pain, constipation, diarrhea, heartburn, nausea and vomiting.  Genitourinary: Negative for dysuria and hematuria.  Musculoskeletal: Negative for back pain and joint pain.  Skin: Negative for rash.  Neurological: Positive for weakness. Negative for sensory change, speech change, focal weakness and headaches.  Endo/Heme/Allergies: Does not bruise/bleed easily.  Psychiatric/Behavioral: Negative for depression. The patient is not nervous/anxious.     DRUG ALLERGIES:   Allergies  Allergen Reactions  . Xarelto [Rivaroxaban] Other (See Comments)    Reaction: GI Bleed  . Clindamycin/Lincomycin Rash  . Dye Fdc Red [Red Dye] Rash  . Lovenox [Enoxaparin Sodium]     Rash, blackness at injection site. Multifactorial and multiple other drugs used at the same time.  [name] Kitchen Penicillins Rash and Other (See Comments)    Has patient had a PCN reaction causing immediate rash, facial/tongue/throat swelling, SOB or lightheadedness with hypotension: Unknown Has patient had a PCN reaction causing severe rash involving mucus membranes or skin necrosis: Unknown Has patient had a PCN reaction that required hospitalization: Unknown Has patient had a PCN  reaction occurring within the last 10 years: No If all of the above answers are "NO", then may proceed with Cephalosporin use.     VITALS:  Blood pressure (!) 141/69, pulse 83, temperature 98.3 F (36.8 C), temperature source Oral, resp. rate 20, height 5\' 8"  (1.727 m), weight 101.5 kg (223 lb 11.2 oz), SpO2 90 %.  PHYSICAL EXAMINATION:   Physical Exam  GENERAL:  71 y.o.-year-old patient lying in the bed with no acute distress.  EYES: Pupils equal, round, reactive to light and accommodation. No scleral icterus. Extraocular muscles intact.  HEENT: Head atraumatic, normocephalic. Oropharynx and nasopharynx clear.  NECK:  Supple, no jugular venous distention. No thyroid enlargement, no tenderness.  LUNGS: Good air entry bilaterally with mild wheezing CARDIOVASCULAR: S1, S2 normal. No murmurs, rubs, or gallops.  ABDOMEN: Soft, nontender, nondistended. Bowel sounds present. No organomegaly or mass.  EXTREMITIES: No cyanosis, clubbing or edema b/l.    NEUROLOGIC: Cranial nerves II through XII are intact. No focal Motor or sensory deficits b/l.   PSYCHIATRIC: The patient is alert and oriented x 3.  SKIN: No obvious rash, lesion, or ulcer.   LABORATORY PANEL:   CBC  Recent Labs Lab 05/07/17 0817  WBC 16.2*  HGB 10.0*  HCT 29.3*  PLT 263   ------------------------------------------------------------------------------------------------------------------ Chemistries   Recent Labs Lab 05/04/17 1337  05/07/17 0817  NA 132*  < > 138  K 5.2*  < > 4.3  CL 104  < > 105  CO2 21*  < > 23  GLUCOSE 173*  < > 176*  BUN 40*  < > 33*  CREATININE 1.98*  < > 1.10  CALCIUM 8.1*  < >  8.6*  AST 28  --   --   ALT 21  --   --   ALKPHOS 59  --   --   BILITOT 1.1  --   --   < > = values in this interval not displayed. ------------------------------------------------------------------------------------------------------------------  Cardiac Enzymes  Recent Labs Lab 05/02/17 1945   TROPONINI <0.03   ------------------------------------------------------------------------------------------------------------------  RADIOLOGY:  Dg Chest 2 View  Result Date: 05/07/2017 CLINICAL DATA:  Recent hemoptysis EXAM: CHEST  2 VIEW COMPARISON:  Chest radiograph May 04, 2017; chest CT February 19, 2017 FINDINGS: There is underlying bullous emphysematous change. There is no edema or consolidation. There is slight scarring in the left mid lung. Heart size and pulmonary vascularity are normal. No adenopathy. There is multifocal degenerative change in the thoracic spine. IMPRESSION: Underlying emphysematous change. Mild scarring left mid lung. No edema or consolidation. Stable cardiac silhouette. Electronically Signed   By: Lowella Grip III M.D.   On: 05/07/2017 12:08     ASSESSMENT AND PLAN:   * Hemoptysis Likely from acute bronchitis Repeat CXR shows no change. Discussed with Dr. Leonidas Romberg of pulmonary and he will see him later today. Monitor for now. May need CT or bronchoscopy as OP if stable.  * Shock likely hypovolemic or due to sepsis Off pressors. Improved. Hemoglobin stable. On Levaquin for bronchitis. No pneumonia on chest x-ray. Blood cultures NGTD.  * Acute bronchitis with COPD exacerbation and acute hypoxic respiratory failure - steroids, Antibiotics - Scheduled Nebulizers - Inhalers - Wean O2 as tolerated - Home O2 set up  * Diabetes mellitus. Sliding scale insulin. Diabetic diet.  * Gastric and duodenal ulcers with upper GI bleed and melena. Acute blood loss anemia. Significant drop in hemoglobin which seems to be stabilizing at this time.  Continue  Protonix. Monitor hemoglobin May need further investigations depending on hemoglobin trend. Hb stable at this time  * Hypertension. Held  amlodipine and lisinopril due to hypotension and hyperkalemia Started HCTZ.  * Hyperkalemia. We'll give 1 dose of Kayexalate stat.  DVT prophylaxis with SCDs  All the  records are reviewed and case discussed with Care Management/Social Worker Management plans discussed with the patient, family and they are in agreement.  CODE STATUS: FULL CODE  TOTAL TIME TAKING CARE OF THIS PATIENT: 30 minutes.   Likely d/c home tomorrow  Hillary Bow R M.D on 05/07/2017 at 12:29 PM  Between 7am to 6pm - Pager - [phone]  After 6pm go to [email or web address] - password [email or web address] [facility]  Office  [phone]  CC: Primary care physician; [name], MD  Note: This dictation was prepared with Dragon dictation along with smaller phrase technology. Any transcriptional errors that result from this process are unintentional.

## 2017-05-07 NOTE — Care Management (Signed)
Patient admitted with shock and bronchitis with COPD exacerbation.  Patient lives at home with wife.  PCP [place].  PT has assessed patient and does not recommended any PT follow up.  Patient states that he has a walker in the home.  Patient has qualifying home O2 sats and has been ordered for home O2. And home health RN. Patient provided with choice of home health agency.  Patient states that he does not have a preference of agency.  Referral made to [facility] with [facility].  Discharge order has currently be discontinued due to hemoptysis . Qualifying O2 sats were obtained 05/06/17 at 6:52 pm if discharge is greater than 48 hours qualifying saturation will have to be obtained again. RNCM following.

## 2017-05-07 NOTE — Progress Notes (Signed)
We were asked to see [name] again for wheezing, persistent hypoxemia and mild hemoptysis. He continues to have shortness of breath with minimal exertion and continues to cough up scant bloody mucus. There are no fevers and he denies chest pain.  Vitals:   05/07/17 1145 05/07/17 1148 05/07/17 1347 05/07/17 1532  BP:   (!) 150/68   Pulse:   84   Resp:   12   Temp:   97.4 F (36.3 C)   TempSrc:   Oral   SpO2: (!) 87% 90% 92% 92%  Weight:      Height:       No distress at rest Cognition intact Diffuse coarse wheezes Regular, no murmurs Abdomen soft, bowel sounds present Extremities without clubbing cyanosis or edema No focal neurologic deficits  BMP Latest Ref Rng & Units 05/07/2017 05/06/2017 05/05/2017  Glucose 65 - 99 mg/dL 176(H) 169(H) 229(H)  BUN 6 - 20 mg/dL 33(H) 33(H) 42(H)  Creatinine 0.61 - 1.24 mg/dL 1.10 1.24 1.98(H)  Sodium 135 - 145 mmol/L 138 136 133(L)  Potassium 3.5 - 5.1 mmol/L 4.3 5.7(H) 5.0  Chloride 101 - 111 mmol/L 105 109 107  CO2 22 - 32 mmol/L 23 21(L) 19(L)  Calcium 8.9 - 10.3 mg/dL 8.6(L) 8.3(L) 7.8(L)    CBC Latest Ref Rng & Units 05/07/2017 05/06/2017 05/05/2017  WBC 3.8 - 10.6 K/uL 16.2(H) 17.7(H) 19.7(H)  Hemoglobin 13.0 - 18.0 g/dL 10.0(L) 8.4(L) 8.7(L)  Hematocrit 40.0 - 52.0 % 29.3(L) 24.5(L) 25.7(L)  Platelets 150 - 440 K/uL 263 197 166     CXR (05/07/17): No acute findings  Impression: 1) emphysema with mild obstruction - followed by Dr. Jeral Fruit pulmonary medicine in [place] 2) acute COPD exacerbation with wheezing 3) scant hemoptysis with no x-ray findings. A CT scan of the chest from March of this year revealed emphysematous changes but no masses or airway tumors. The hemoptysis is most likely related to bronchitis  PLAN/REC:  1) continue treatment for COPD exacerbation. While he is in the hospital, he should remain on nebulized steroids and bronchodilators. 2) continue systemic steroids. Presently on prednisone 30 mg twice a day.  Taper this to off over the next 7-10 days.  3) assess oxygen needs prior to discharge. If he is discharged home with oxygen, it is possible that this might not be necessary indefinitely but rather for a short period of time as he completely resolved from the current exacerbation 4) he has scheduled follow-up with Dr. Lake Bells in July 25. We might want to move this up. I will send a copy of this note to Dr. Lake Bells and allow him to decide.  PCCM will sign off. Please call if we can be of further assistance  Colton Border, MD PCCM service Mobile [phone] Pager [phone] 05/07/2017 4:48 PM

## 2017-05-07 NOTE — Care Management Important Message (Signed)
Important Message  Patient Details  Name: [name] MRN: [id number] Date of Birth: [birth year]   Medicare Important Message Given:  Yes    Beverly Sessions, RN 05/07/2017, 10:40 AM

## 2017-05-07 NOTE — Evaluation (Addendum)
Occupational Therapy Evaluation Patient Details Name: [name] MRN: [id number] DOB: Mar 03, 1946 Today's Date: 05/07/2017    History of Present Illness Pt. is a 71 y/o Male who was admitteded with melena, reports of epigastric abdominal pain, acute bronchitis with COPD exacerbation, shock likely hypovolemic or due to sepsis, and gastric and duodenal ulcers with upper GI bleed and melena. Pt. PMHx includes: aortic transection, basal cell carcinoma, L femoral neck fx s/p ORIF, and MI.   Clinical Impression   Pt. is a 71 y.o. male who presents with limited activity tolerance, and fluctuating SO2 levels. Pt. is on 3LO2. Pt. SO2 level is 92% HR 78. Pt. and wife education was provided about energy conservation techniques, and work simplification strategies for ADL, and IADL functioning. Pt. is familiar with A/E from prior hip surgery.  No further OT services are warranted at this time.     Follow Up Recommendations  No OT follow up    Equipment Recommendations       Recommendations for Other Services       Precautions / Restrictions Restrictions Weight Bearing Restrictions: No                                                   ADL either performed or assessed with clinical judgement   ADL Overall ADL's : Needs assistance/impaired Eating/Feeding: Independent   Grooming: Independent   Upper Body Bathing: Set up   Lower Body Bathing: Set up   Upper Body Dressing : Set up   Lower Body Dressing: Set up   Toilet Transfer: Set up           Functional mobility during ADLs: Independent       Vision Baseline Vision/History: Wears glasses Patient Visual Report: No change from baseline       Perception     Praxis      Pertinent Vitals/Pain Pain Assessment: No/denies pain     Hand Dominance Right   Extremity/Trunk Assessment Upper Extremity Assessment Upper Extremity Assessment: Overall WFL for tasks assessed           Communication  Communication Communication: No difficulties   Cognition Arousal/Alertness: Awake/alert Behavior During Therapy: WFL for tasks assessed/performed Overall Cognitive Status: Within Functional Limits for tasks assessed                                     General Comments       Exercises     Shoulder Instructions      Home Living Family/patient expects to be discharged to:: Private residence Living Arrangements: Spouse/significant other Available Help at Discharge: Family;Available PRN/intermittently Type of Home: House Home Access: Stairs to enter [employer] of Steps: 4   Home Layout: Two level   Alternate Level Stairs-Rails: Right Bathroom Shower/Tub: Tub/shower unit         Home Equipment: Walker - 2 wheels          Prior Functioning/Environment Level of Independence: Independent        Comments: Independent with ADLs, IADLs and working fulltime building houses.        OT Problem List:       OT Treatment/Interventions: Self-care/ADL training;Therapeutic exercise;Patient/family education;DME and/or AE instruction;Energy conservation;Therapeutic activities    OT Goals(Current goals can be found  in the care plan section) Acute Rehab OT Goals Patient Stated Goal: To return to work OT Goal Formulation: With patient Potential to Achieve Goals: Good  OT Frequency:    Barriers to D/C:            Co-evaluation              AM-PAC PT "6 Clicks" Daily Activity     Outcome Measure Help from another person eating meals?: None Help from another person taking care of personal grooming?: None Help from another person toileting, which includes using toliet, bedpan, or urinal?: None Help from another person bathing (including washing, rinsing, drying)?: A Little Help from another person to put on and taking off regular upper body clothing?: None Help from another person to put on and taking off regular lower body clothing?: None 6  Click Score: 23   End of Session    Activity Tolerance: Patient tolerated treatment well Patient left: in bed;with call bell/phone within reach;with bed alarm set  OT Visit Diagnosis: Repeated falls (R29.6)                Time: 8638-1771 OT Time Calculation (min): 27 min Charges:  OT General Charges $OT Visit: 1 Procedure OT Evaluation $OT Eval Moderate Complexity: 1 Procedure G-Codes:     Harrel Carina, MS, OTR/L  Harrel Carina, MS, OTR/L 05/07/2017, 3:43 PM

## 2017-05-08 LAB — GLUCOSE, CAPILLARY
Glucose-Capillary: 205 mg/dL — ABNORMAL HIGH (ref 65–99)
Glucose-Capillary: 276 mg/dL — ABNORMAL HIGH (ref 65–99)

## 2017-05-08 NOTE — Care Management Note (Addendum)
Case Management Note  Patient Details  Name: [name] MRN: [id number] Date of Birth: 1946-06-10  Subjective/Objective:      Discussed discharge planning with Mr Oran son. Son chose [facility] and a referral for home health RN was called to [place] at [facility]. Mr Sapp has a portable 02 tank in his hospital room from [facility]. Son verbalized understanding to call [facility] as soon as Mr Choe returned home today for [facility] to set up new home oxygen.              Action/Plan:   Expected Discharge Date:  05/08/17               Expected Discharge Plan:   05/08/17  In-House Referral:     Discharge planning Services     Post Acute Care Choice:   Yes Choice offered to:   son and patient  DME Arranged:   New oxygen DME Agency:    [facility] Arranged:   RN [place] Agency:   [facility]  Status of Service:    Completed If discussed at [place] of Stay Meetings, dates discussed:    Additional Comments:  Rayden Scheper A, RN 05/08/2017, 11:24 AM

## 2017-05-08 NOTE — Progress Notes (Signed)
Patient is discharged home with his wife in stable condition

## 2017-05-08 NOTE — Progress Notes (Signed)
Reviewed discharge instructions with patient and wife, including appointments, medications and how to access [place].

## 2017-05-09 LAB — CULTURE, BLOOD (ROUTINE X 2): Culture: NO GROWTH

## 2017-05-10 ENCOUNTER — Telehealth: Payer: Self-pay | Admitting: Pulmonary Disease

## 2017-05-10 ENCOUNTER — Telehealth: Payer: Self-pay | Admitting: *Deleted

## 2017-05-10 ENCOUNTER — Ambulatory Visit: Payer: PPO | Admitting: Adult Health

## 2017-05-10 LAB — CULTURE, BLOOD (ROUTINE X 2): Culture: NO GROWTH

## 2017-05-10 NOTE — Telephone Encounter (Signed)
BQ  Please [place]  Pt's Son Jacson Rapaport called in earlier today requesting an appt for father due to him being discharge from hospital on 6/9 and he is coughing up blood. Nurse was able to get pt an appt for today with TP. Son called back and has decided that he does not want the pt to see an NP that he only wants him to see you. Informed him that you are booked out and he said he understood but they would feel comfortable seeing you more than a NP. Pt is coughing up some blood, is having increase sob with exertion,light headedness,slight chest tightness, some concerns of O2 sats while on oxygen. Pt states Stiolto is not helping much. Please advise if you want to fit pt into your schedule tomorrow.

## 2017-05-10 NOTE — Telephone Encounter (Signed)
Spoke with pt's son, Remo Lipps. Pt has been scheduled with TP today at 3:15pm. Nothing further was needed.

## 2017-05-10 NOTE — Telephone Encounter (Signed)
If BQ's schedule is filled up and he does not want to see NP, then offer pt to be seen by next available MD as an acute visit.  Advise to go to ED if he cannot be seen in time and if hemoptysis worsens.  Marshell Garfinkel MD

## 2017-05-10 NOTE — Telephone Encounter (Signed)
Lm requesting return call to complete TCM and confirm hosp f/u appt  

## 2017-05-10 NOTE — Telephone Encounter (Signed)
Spoke with pt's son Remo Lipps, scheduled next available with BQ but advised if pt's s/s worsened that he needed to come in to be seen by any provider or seek emergency care.  Pt's son expressed understanding.  Nothing further needed at this time.
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
## 2017-05-10 NOTE — Telephone Encounter (Signed)
Forwarding to DOD.

## 2017-05-11 NOTE — Telephone Encounter (Signed)
Transition Care Management Follow-up Telephone Call   Date discharged? 05/08/2017   How have you been since you were released from the hospital? "still tired"   Do you understand why you were in the hospital? yes   Do you understand the discharge instructions? yes   Where were you discharged to? home   Items Reviewed: Pt not wanting to review.   Functional Questionnaire:  Activities of Daily Living (ADLs):   He states they are independent in the following: all areas of daily living   Any transportation issues/concerns?: no   Any patient concerns? no   Confirmed importance and date/time of follow-up visits scheduled yes  Provider Appointment booked with Dr Lorelei Pont 6/21 @1130a   Confirmed with patient if condition begins to worsen call PCP or go to the ER.  Patient was given the office number and encouraged to call back with question or concerns.  : yes

## 2017-05-14 ENCOUNTER — Inpatient Hospital Stay (HOSPITAL_COMMUNITY)
Admit: 2017-05-14 | Discharge: 2017-05-14 | Disposition: A | Discharge status: Home / Self Care | Payer: PPO | Attending: Physician Assistant | Admitting: Physician Assistant

## 2017-05-14 ENCOUNTER — Telehealth: Payer: Self-pay | Admitting: Gastroenterology

## 2017-05-14 ENCOUNTER — Emergency Department: Payer: PPO

## 2017-05-14 ENCOUNTER — Encounter: Payer: Self-pay | Admitting: Medical Oncology

## 2017-05-14 ENCOUNTER — Inpatient Hospital Stay
Admission: EM | Admit: 2017-05-14 | Discharge: 2017-05-16 | DRG: 640 | Disposition: A | Discharge status: Home / Self Care | Payer: PPO | Attending: Internal Medicine | Admitting: Internal Medicine

## 2017-05-14 DIAGNOSIS — Z9049 Acquired absence of other specified parts of digestive tract: Secondary | ICD-10-CM | POA: Diagnosis not present

## 2017-05-14 DIAGNOSIS — Z888 Allergy status to other drugs, medicaments and biological substances status: Secondary | ICD-10-CM | POA: Diagnosis not present

## 2017-05-14 DIAGNOSIS — Z85828 Personal history of other malignant neoplasm of skin: Secondary | ICD-10-CM | POA: Diagnosis not present

## 2017-05-14 DIAGNOSIS — Z881 Allergy status to other antibiotic agents status: Secondary | ICD-10-CM | POA: Diagnosis not present

## 2017-05-14 DIAGNOSIS — E876 Hypokalemia: Secondary | ICD-10-CM

## 2017-05-14 DIAGNOSIS — Z833 Family history of diabetes mellitus: Secondary | ICD-10-CM | POA: Diagnosis not present

## 2017-05-14 DIAGNOSIS — E861 Hypovolemia: Secondary | ICD-10-CM | POA: Diagnosis present

## 2017-05-14 DIAGNOSIS — R55 Syncope and collapse: Secondary | ICD-10-CM | POA: Diagnosis not present

## 2017-05-14 DIAGNOSIS — R7989 Other specified abnormal findings of blood chemistry: Secondary | ICD-10-CM | POA: Diagnosis present

## 2017-05-14 DIAGNOSIS — J439 Emphysema, unspecified: Secondary | ICD-10-CM | POA: Diagnosis present

## 2017-05-14 DIAGNOSIS — E1151 Type 2 diabetes mellitus with diabetic peripheral angiopathy without gangrene: Secondary | ICD-10-CM | POA: Diagnosis present

## 2017-05-14 DIAGNOSIS — T502X5A Adverse effect of carbonic-anhydrase inhibitors, benzothiadiazides and other diuretics, initial encounter: Secondary | ICD-10-CM | POA: Diagnosis present

## 2017-05-14 DIAGNOSIS — E860 Dehydration: Secondary | ICD-10-CM | POA: Diagnosis present

## 2017-05-14 DIAGNOSIS — Z880 Allergy status to penicillin: Secondary | ICD-10-CM | POA: Diagnosis not present

## 2017-05-14 DIAGNOSIS — J9611 Chronic respiratory failure with hypoxia: Secondary | ICD-10-CM | POA: Diagnosis present

## 2017-05-14 DIAGNOSIS — I248 Other forms of acute ischemic heart disease: Secondary | ICD-10-CM | POA: Diagnosis present

## 2017-05-14 DIAGNOSIS — Z8249 Family history of ischemic heart disease and other diseases of the circulatory system: Secondary | ICD-10-CM | POA: Diagnosis not present

## 2017-05-14 DIAGNOSIS — J432 Centrilobular emphysema: Secondary | ICD-10-CM

## 2017-05-14 DIAGNOSIS — I252 Old myocardial infarction: Secondary | ICD-10-CM

## 2017-05-14 DIAGNOSIS — Z87891 Personal history of nicotine dependence: Secondary | ICD-10-CM

## 2017-05-14 DIAGNOSIS — N170 Acute kidney failure with tubular necrosis: Secondary | ICD-10-CM | POA: Diagnosis present

## 2017-05-14 DIAGNOSIS — R0902 Hypoxemia: Secondary | ICD-10-CM | POA: Diagnosis not present

## 2017-05-14 DIAGNOSIS — Z820 Family history of epilepsy and other diseases of the nervous system: Secondary | ICD-10-CM

## 2017-05-14 DIAGNOSIS — R9431 Abnormal electrocardiogram [ECG] [EKG]: Secondary | ICD-10-CM | POA: Diagnosis not present

## 2017-05-14 DIAGNOSIS — Z7984 Long term (current) use of oral hypoglycemic drugs: Secondary | ICD-10-CM

## 2017-05-14 DIAGNOSIS — R778 Other specified abnormalities of plasma proteins: Secondary | ICD-10-CM | POA: Diagnosis present

## 2017-05-14 DIAGNOSIS — Z9981 Dependence on supplemental oxygen: Secondary | ICD-10-CM | POA: Diagnosis not present

## 2017-05-14 DIAGNOSIS — E785 Hyperlipidemia, unspecified: Secondary | ICD-10-CM | POA: Diagnosis present

## 2017-05-14 DIAGNOSIS — T508X5A Adverse effect of diagnostic agents, initial encounter: Secondary | ICD-10-CM | POA: Diagnosis not present

## 2017-05-14 DIAGNOSIS — Z91048 Other nonmedicinal substance allergy status: Secondary | ICD-10-CM

## 2017-05-14 DIAGNOSIS — D500 Iron deficiency anemia secondary to blood loss (chronic): Secondary | ICD-10-CM

## 2017-05-14 DIAGNOSIS — I2510 Atherosclerotic heart disease of native coronary artery without angina pectoris: Secondary | ICD-10-CM | POA: Diagnosis present

## 2017-05-14 DIAGNOSIS — I10 Essential (primary) hypertension: Secondary | ICD-10-CM | POA: Diagnosis present

## 2017-05-14 DIAGNOSIS — J449 Chronic obstructive pulmonary disease, unspecified: Secondary | ICD-10-CM | POA: Diagnosis present

## 2017-05-14 DIAGNOSIS — I951 Orthostatic hypotension: Secondary | ICD-10-CM | POA: Diagnosis not present

## 2017-05-14 DIAGNOSIS — E119 Type 2 diabetes mellitus without complications: Secondary | ICD-10-CM | POA: Diagnosis not present

## 2017-05-14 DIAGNOSIS — N179 Acute kidney failure, unspecified: Secondary | ICD-10-CM

## 2017-05-14 DIAGNOSIS — Z8711 Personal history of peptic ulcer disease: Secondary | ICD-10-CM

## 2017-05-14 DIAGNOSIS — K922 Gastrointestinal hemorrhage, unspecified: Secondary | ICD-10-CM | POA: Diagnosis present

## 2017-05-14 DIAGNOSIS — K259 Gastric ulcer, unspecified as acute or chronic, without hemorrhage or perforation: Secondary | ICD-10-CM | POA: Diagnosis not present

## 2017-05-14 DIAGNOSIS — R748 Abnormal levels of other serum enzymes: Secondary | ICD-10-CM

## 2017-05-14 DIAGNOSIS — D72829 Elevated white blood cell count, unspecified: Secondary | ICD-10-CM | POA: Diagnosis not present

## 2017-05-14 LAB — CBC WITH DIFFERENTIAL/PLATELET
Basophils Absolute: 0 10*3/uL (ref 0–0.1)
Basophils Relative: 0 %
Eosinophils Absolute: 0.1 10*3/uL (ref 0–0.7)
Eosinophils Relative: 1 %
HCT: 37.3 % — ABNORMAL LOW (ref 40.0–52.0)
Hemoglobin: 13.3 g/dL (ref 13.0–18.0)
Lymphocytes Relative: 15 %
Lymphs Abs: 1.4 10*3/uL (ref 1.0–3.6)
MCH: 30.4 pg (ref 26.0–34.0)
MCHC: 35.6 g/dL (ref 32.0–36.0)
MCV: 85.3 fL (ref 80.0–100.0)
Monocytes Absolute: 0.6 10*3/uL (ref 0.2–1.0)
Monocytes Relative: 7 %
Neutro Abs: 7.2 10*3/uL — ABNORMAL HIGH (ref 1.4–6.5)
Neutrophils Relative %: 77 %
Platelets: 215 10*3/uL (ref 150–440)
RBC: 4.38 MIL/uL — ABNORMAL LOW (ref 4.40–5.90)
RDW: 14.6 % — ABNORMAL HIGH (ref 11.5–14.5)
WBC: 9.4 10*3/uL (ref 3.8–10.6)

## 2017-05-14 LAB — GLUCOSE, CAPILLARY
Glucose-Capillary: 139 mg/dL — ABNORMAL HIGH (ref 65–99)
Glucose-Capillary: 181 mg/dL — ABNORMAL HIGH (ref 65–99)

## 2017-05-14 LAB — COMPREHENSIVE METABOLIC PANEL WITH GFR
ALT: 27 U/L (ref 17–63)
AST: 25 U/L (ref 15–41)
Albumin: 4.1 g/dL (ref 3.5–5.0)
Alkaline Phosphatase: 75 U/L (ref 38–126)
Anion gap: 10 (ref 5–15)
BUN: 32 mg/dL — ABNORMAL HIGH (ref 6–20)
CO2: 22 mmol/L (ref 22–32)
Calcium: 9 mg/dL (ref 8.9–10.3)
Chloride: 100 mmol/L — ABNORMAL LOW (ref 101–111)
Creatinine, Ser: 1.25 mg/dL — ABNORMAL HIGH (ref 0.61–1.24)
GFR calc Af Amer: >60 mL/min
GFR calc non Af Amer: 57 mL/min — ABNORMAL LOW
Glucose, Bld: 231 mg/dL — ABNORMAL HIGH (ref 65–99)
Potassium: 3.4 mmol/L — ABNORMAL LOW (ref 3.5–5.1)
Sodium: 132 mmol/L — ABNORMAL LOW (ref 135–145)
Total Bilirubin: 1.3 mg/dL — ABNORMAL HIGH (ref 0.3–1.2)
Total Protein: 6.8 g/dL (ref 6.5–8.1)

## 2017-05-14 LAB — URINALYSIS, COMPLETE (UACMP) WITH MICROSCOPIC
Bacteria, UA: NONE SEEN
Bilirubin Urine: NEGATIVE
Glucose, UA: >=500 mg/dL — AB
Hgb urine dipstick: NEGATIVE
Ketones, ur: NEGATIVE mg/dL
Leukocytes, UA: NEGATIVE
Nitrite: NEGATIVE
Protein, ur: NEGATIVE mg/dL
RBC / HPF: NONE SEEN RBC/hpf (ref 0–5)
Specific Gravity, Urine: 1.015 (ref 1.005–1.030)
Squamous Epithelial / HPF: NONE SEEN
pH: 5 (ref 5.0–8.0)

## 2017-05-14 LAB — HEMOGLOBIN: Hemoglobin: 14.1 g/dL (ref 13.0–18.0)

## 2017-05-14 LAB — TROPONIN I
Troponin I: 0.2 ng/mL
Troponin I: 0.16 ng/mL
Troponin I: 0.16 ng/mL

## 2017-05-14 LAB — MAGNESIUM: Magnesium: 1.9 mg/dL (ref 1.7–2.4)

## 2017-05-14 LAB — CK: Total CK: 30 U/L — ABNORMAL LOW (ref 49–397)

## 2017-05-14 MED ORDER — IOPAMIDOL (ISOVUE-370) INJECTION 76%
75.0000 mL | Freq: Once | INTRAVENOUS | Status: AC | PRN
  Administered 2017-05-14: 75 mL via INTRAVENOUS

## 2017-05-14 MED ORDER — POTASSIUM CHLORIDE CRYS ER 20 MEQ PO TBCR
40.0000 meq | EXTENDED_RELEASE_TABLET | Freq: Once | ORAL | Status: AC
  Administered 2017-05-14: 40 meq via ORAL
  Filled 2017-05-14: qty 2

## 2017-05-14 MED ORDER — POLYETHYLENE GLYCOL 3350 17 G PO PACK: 17.0000 g | PACK | Freq: Every day | ORAL | Status: DC | PRN

## 2017-05-14 MED ORDER — ACETAMINOPHEN 325 MG PO TABS: 650.0000 mg | ORAL_TABLET | Freq: Four times a day (QID) | ORAL | Status: DC | PRN

## 2017-05-14 MED ORDER — ONDANSETRON HCL 4 MG/2ML IJ SOLN: 4.0000 mg | Freq: Four times a day (QID) | INTRAMUSCULAR | Status: DC | PRN

## 2017-05-14 MED ORDER — METFORMIN HCL ER 500 MG PO TB24
500.0000 mg | ORAL_TABLET | Freq: Every day | ORAL | Status: DC
  Filled 2017-05-14: qty 1

## 2017-05-14 MED ORDER — ACETAMINOPHEN 650 MG RE SUPP: 650.0000 mg | Freq: Four times a day (QID) | RECTAL | Status: DC | PRN

## 2017-05-14 MED ORDER — FERROUS SULFATE 325 (65 FE) MG PO TABS
325.0000 mg | ORAL_TABLET | Freq: Every day | ORAL | Status: DC
  Administered 2017-05-15 – 2017-05-16 (×2): 325 mg via ORAL
  Filled 2017-05-14 (×2): qty 1

## 2017-05-14 MED ORDER — FAMOTIDINE 20 MG PO TABS
40.0000 mg | ORAL_TABLET | Freq: Once | ORAL | Status: AC
  Administered 2017-05-14: 40 mg via ORAL
  Filled 2017-05-14: qty 2

## 2017-05-14 MED ORDER — DIPHENHYDRAMINE HCL 25 MG PO CAPS
25.0000 mg | ORAL_CAPSULE | Freq: Four times a day (QID) | ORAL | Status: DC | PRN
  Administered 2017-05-14: 25 mg via ORAL
  Filled 2017-05-14: qty 1

## 2017-05-14 MED ORDER — INSULIN ASPART 100 UNIT/ML ~~LOC~~ SOLN: 0.0000 [IU] | Freq: Every day | SUBCUTANEOUS | Status: DC

## 2017-05-14 MED ORDER — INSULIN ASPART 100 UNIT/ML ~~LOC~~ SOLN
0.0000 [IU] | Freq: Three times a day (TID) | SUBCUTANEOUS | Status: DC
  Administered 2017-05-14 – 2017-05-15 (×2): 2 [IU] via SUBCUTANEOUS
  Administered 2017-05-15 – 2017-05-16 (×3): 3 [IU] via SUBCUTANEOUS
  Filled 2017-05-14 (×5): qty 1

## 2017-05-14 MED ORDER — SODIUM CHLORIDE 0.9 % IV SOLN
1000.0000 mL | Freq: Once | INTRAVENOUS | Status: AC
  Administered 2017-05-14: 1000 mL via INTRAVENOUS

## 2017-05-14 MED ORDER — POTASSIUM CHLORIDE IN NACL 20-0.9 MEQ/L-% IV SOLN
INTRAVENOUS | Status: AC
  Administered 2017-05-14 – 2017-05-15 (×2): via INTRAVENOUS
  Filled 2017-05-14 (×2): qty 1000

## 2017-05-14 MED ORDER — SODIUM CHLORIDE 0.9% FLUSH
3.0000 mL | Freq: Two times a day (BID) | INTRAVENOUS | Status: DC
  Administered 2017-05-14 – 2017-05-16 (×2): 3 mL via INTRAVENOUS

## 2017-05-14 MED ORDER — SODIUM CHLORIDE 0.9 % IV BOLUS (SEPSIS)
500.0000 mL | Freq: Once | INTRAVENOUS | Status: AC
  Administered 2017-05-14: 500 mL via INTRAVENOUS

## 2017-05-14 MED ORDER — ATORVASTATIN CALCIUM 20 MG PO TABS
40.0000 mg | ORAL_TABLET | Freq: Every day | ORAL | Status: DC
  Administered 2017-05-15 – 2017-05-16 (×2): 40 mg via ORAL
  Filled 2017-05-14 (×2): qty 2

## 2017-05-14 MED ORDER — ONDANSETRON HCL 4 MG PO TABS: 4.0000 mg | ORAL_TABLET | Freq: Four times a day (QID) | ORAL | Status: DC | PRN

## 2017-05-14 MED ORDER — PANTOPRAZOLE SODIUM 40 MG PO TBEC
40.0000 mg | DELAYED_RELEASE_TABLET | Freq: Every day | ORAL | Status: DC
  Administered 2017-05-15 – 2017-05-16 (×2): 40 mg via ORAL
  Filled 2017-05-14 (×2): qty 1

## 2017-05-14 MED ORDER — AMLODIPINE BESYLATE 5 MG PO TABS: 5.0000 mg | ORAL_TABLET | Freq: Every day | ORAL | Status: DC

## 2017-05-14 MED ORDER — CITALOPRAM HYDROBROMIDE 20 MG PO TABS
20.0000 mg | ORAL_TABLET | Freq: Every day | ORAL | Status: DC
  Administered 2017-05-15 – 2017-05-16 (×2): 20 mg via ORAL
  Filled 2017-05-14 (×2): qty 1

## 2017-05-14 MED ORDER — ALBUTEROL SULFATE (2.5 MG/3ML) 0.083% IN NEBU: 2.5000 mg | INHALATION_SOLUTION | RESPIRATORY_TRACT | Status: DC | PRN
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
## 2017-05-14 NOTE — Progress Notes (Signed)
[facility]  Patient Status: HH never started on patient, answered initial phone call and stated he was unsure about [facility] services and that he would call back. Left VMs on 6/11, 6/12, 6/13, 6/14 with no return call. Will need new referral if patient agrees to [facility] services at discharge. Informed Marshell Garfinkel, CM.   [name] 05/14/2017, 1:22 PM

## 2017-05-14 NOTE — Progress Notes (Signed)
*  PRELIMINARY RESULTS* Echocardiogram 2D Echocardiogram has been performed.  Jaelah Hauth 05/14/2017, 9:02 PM

## 2017-05-14 NOTE — Consult Note (Signed)
Cardiology Consultation Note  Patient ID: [name], MRN: [id number], DOB/AGE: 70/16/1947 70 y.o. Admit date: 05/14/2017   Date of Consult: 05/14/2017 Primary Physician: [name], MD Primary Cardiologist: Dr. Yvone Neu, MD (previous) Requesting Physician: Dr. Hillary Bow, MD  Chief Complaint: Dizziness  Reason for Consult: Elevated troponin  HPI: [name] is a 71 y.o. male who is being seen today for the evaluation of elevated troponin at the request of Dr. Hillary Bow, MD. Patient has a h/o bleeding gastric ulcers requiring blood transfusion in early June 2018, CAD MI in 1990s s/p thrombolytics with subsequent LHC at [facility] showing nonobstructive disease, HTN, HLD, PVD, DM, COPD/emphysema on home oxygen at 3 L as of early June, CKD, gallstone pancreatitis, basal cell carcinoma, and s/p repair of injuries following fall from 2 story house consisting of fracture to L radial neck & femoral neck, aortic transection, clavicle and rib fractures, vertebral fractures, lung contusion, and coma X2 weeks in 2005. He was recently admitted in early June for upper GI bleed requiring transfusion, hyperkalemia with discontinuation of losartan and initiation of HCTZ. He developed worsening SOB, dizziness, and near syncope. Troponin found to be mildly elevated at 0.2. Cardiology asked to evaluate.   Past cardiac surgical history includes a cardiac catheterization 08/1991 with 50% mid LAD stenosis and 25-50% OM2 & cardiovascular surgery and thoracotomy for aorta repair as above.    He recently established with Dr. Yvone Neu at PCP request 2/2 SOB. He underwent echo in 08/2016 that showed EF 55-60%, normal wall motion, GR1DD, PASP normal. Lexiscan Myoview in 08/2016 without evidence of ischemia, low risk. He has been seen by pulmonary given his SOB with CT chest showing COPD as well as severe 3-vessel coronary calcifications.   Patient recently admitted in early June after admission with multiple new onset  diagnoses, including mild hemoptysis felt to be 2/2 acute bronchitis, acute bronchitis with COPD exacerbation and acute hypoxic respiratory failure, upper GI bleeding and melena due to with gastric and duodenal ulcers and associated acute blood loss anemia, and shock likely 2/2 hypovolemia requiring blood transfusion. EKG at that admission showed possible old anterior MI.   Today 05/14/17, the patient presents to [facility] ED with reports of progressively worsening SOB, dizziness, and near syncope upon standing or with activity. The patient denies fevers, chills, and chest pain. The patient's wife also reports that she performed orthostatics at home and is concerned that the patient has orthostatic hypotension. Details of home BP are unclear. She further states that the patient's bowel movements appeared abnormal today (darker) but without blood or melena. He has also noted intermittent posterior shoulder pain that radiates to his lower back since his last admission that is present with positional movements.   Upon the patient's arrival to [facility] they were found to have BP 160/82, HR 93, temp 98.42F, oxygen saturation 97% on room air, weight 223 pounds. EKG showed NSR 79 bpm, baseline artifact, no acute st/t changes. Labs showed troponin at 09:39AM is elevated at 0.20, decreased RBC at 4.38 and hematocrit at 37.3, increased neutro Abs at 7.2, UA glucose >= 500, total CK 30, decreased sodium 132, potassium 3.4, chloride 100, and increased glucose 231, BUN 32, serum creatinine 1.25, and total bilirubin at 1.3 as well as reduced GFR at 57. CT angio shows no pulmonary emboli or acute chest pathology, advanced emphysema, advanced coronary artery calcification, aortic atherosclerosis, and emphysema. He has not been given ASA in the ED or at time of admission 2/2 recent  GI bleed. Currently feels like he is at his ~ baseline.   Past Medical History:  Diagnosis Date  . Aortic transection 2005   Traumatic after a fall from a  second floor. s/p repair at [facility]  . Basal cell carcinoma   . CAD (coronary artery disease)   . Depression   . Diabetes mellitus    Type II  . Erectile dysfunction   . Fall 2005   fell off house: torn aorta, clavicle fracture, rib fracture, vertebral fractures, lung contusion, coma x 2 weeks  . Fracture of femoral neck, left ([place]) 08/09/2014   ORIF, Dr. Marry Guan  . Fracture of radial neck, left, closed 08/09/2014  . Gallstone pancreatitis   . Gastric ulcer   . History of ATN 08/09/2014   [facility], 2 days of dialysis ([facility])  . History of bleeding ulcers   . Hyperlipidemia    Statin with joint pain   . Hypertension   . MI (myocardial infarction) ([place]) 1992   Acute anterior MI, thrombolytic therapy  . Peripheral vascular disease [facility])    Atherosclerotic:R renal artery stenosis      Most Recent Cardiac Studies: TTE 08/2016: Study Conclusions  - Left ventricle: The cavity size was normal. Wall thickness was   normal. Systolic function was normal. The estimated ejection   fraction was in the range of 55% to 60%. Wall motion was normal;   there were no regional wall motion abnormalities. Doppler   parameters are consistent with abnormal left ventricular   relaxation (grade 1 diastolic dysfunction). - Left atrium: The atrium was mildly dilated. - Pulmonary arteries: Systolic pressure was within the normal   range.  Lexiscan Myoview 08/2016: Exercise myocardial perfusion imaging study with no significant  ischemia Normal wall motion, EF estimated at 57% No EKG changes concerning for ischemia at peak stress or in recovery. Target heart rate achieved Hypertensive with exercise Low risk scan   Surgical History:  Past Surgical History:  Procedure Laterality Date  . CARDIAC CATHETERIZATION  08/1991   50 % mid-Lad stenosis with clot, 25-505 second marginal  . CARDIOVASCULAR SURGERY     with ruptured Aorta, Dr. Camila Li, [facility]   . CHOLECYSTECTOMY    . ESOPHAGOGASTRODUODENOSCOPY (EGD)  WITH PROPOFOL N/A 05/03/2017   Procedure: ESOPHAGOGASTRODUODENOSCOPY (EGD) WITH PROPOFOL;  Surgeon: Lucilla Lame, MD;  Location: [facility] ENDOSCOPY;  Service: Endoscopy;  Laterality: N/A;  . KNEE SURGERY     Right   . ORIF HIP FRACTURE Left 08/12/2014   Dr. Marry Guan  . THORACOTOMY     thoracic aorta repair   . TRACHEOSTOMY     s/p reversal     Home Meds: Prior to Admission medications   Medication Sig Start Date End Date Taking? Authorizing Provider  amLODipine (NORVASC) 5 MG tablet Take 5 mg by mouth daily.   Yes [provider]  atorvastatin (LIPITOR) 40 MG tablet Take 1 tablet (40 mg total) by mouth daily. 04/24/17  Yes Copland, Frederico Hamman, MD  citalopram (CELEXA) 20 MG tablet TAKE ONE TABLET EVERY DAY 12/23/16  Yes Copland, Frederico Hamman, MD  ferrous sulfate 325 (65 FE) MG EC tablet Take 1 tablet (325 mg total) by mouth daily with breakfast. 05/07/17 05/07/18 Yes Sudini, Srikar, MD  hydrochlorothiazide (HYDRODIURIL) 12.5 MG tablet Take 1 tablet (12.5 mg total) by mouth daily. 05/07/17  Yes Sudini, Alveta Heimlich, MD  metFORMIN (GLUCOPHAGE-XR) 500 MG 24 hr tablet TAKE ONE TABLET BY MOUTH EVERY DAY 04/19/17  Yes Copland, Frederico Hamman, MD  pantoprazole (PROTONIX) 40 MG  tablet Take 1 tablet (40 mg total) by mouth daily. 05/07/17  Yes Sudini, Alveta Heimlich, MD  Tiotropium Bromide-Olodaterol (STIOLTO RESPIMAT) 2.5-2.5 MCG/ACT AERS Inhale 2 puffs into the lungs daily. 03/22/17  Yes Parrett, Tammy S, NP  predniSONE (DELTASONE) 20 MG tablet Take 1 tablet (20 mg total) by mouth 2 (two) times daily. Patient not taking: Reported on 05/14/2017 05/08/17   Hillary Bow, MD    Inpatient Medications:  . insulin aspart  0-15 Units Subcutaneous TID WC  . insulin aspart  0-5 Units Subcutaneous QHS  . sodium chloride flush  3 mL Intravenous Q12H   . 0.9 % NaCl with KCl 20 mEq / L      Allergies:  Allergies  Allergen Reactions  . Xarelto [Rivaroxaban] Other (See Comments)    Reaction: GI Bleed  . Clindamycin/Lincomycin Rash  . Dye Fdc  Red [Red Dye] Rash  . Lovenox [Enoxaparin Sodium]     Rash, blackness at injection site. Multifactorial and multiple other drugs used at the same time.  [name] Kitchen Penicillins Rash and Other (See Comments)    Has patient had a PCN reaction causing immediate rash, facial/tongue/throat swelling, SOB or lightheadedness with hypotension: Unknown Has patient had a PCN reaction causing severe rash involving mucus membranes or skin necrosis: Unknown Has patient had a PCN reaction that required hospitalization: Unknown Has patient had a PCN reaction occurring within the last 10 years: No If all of the above answers are "NO", then may proceed with Cephalosporin use.     Social History   Social History  . Marital status: Married    Spouse name: N/A  . Number of children: N/A  . Years of education: N/A   Occupational History  . 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 Smoking status: Former Smoker    Packs/day: 1.50    Years: 20.00    Types: Cigarettes    Quit date: 09/13/1991  . Smokeless tobacco: Never Used     Comment: quit post MI  . Alcohol use Yes  . Drug use: No  . Sexual activity: Not on file   Other Topics Concern  . Not on file   Social History Narrative   No regular exercise      Family History  Problem Relation Age of Onset  . Dementia Mother 16  . Heart attack Father 4  . Diabetes Brother 109  . Heart attack Brother   . Colon cancer Neg Hx   . Stomach cancer Neg Hx   . Esophageal cancer Neg Hx   . Rectal cancer Neg Hx      Review of Systems: Review of Systems  Constitutional: Positive for malaise/fatigue. Negative for chills, diaphoresis, fever and weight loss.  HENT: Negative for congestion.   Eyes: Negative for discharge and redness.  Respiratory: Positive for shortness of breath. Negative for cough, hemoptysis, sputum production and wheezing.   Cardiovascular: Negative for chest pain, palpitations, orthopnea, claudication, leg swelling  and PND.  Gastrointestinal: Positive for melena. Negative for abdominal pain, blood in stool, heartburn, nausea and vomiting.       Per ED, wife reports changes in bowel movements. Darker stools.  Genitourinary: Negative for hematuria.  Musculoskeletal: Positive for back pain, falls, joint pain and myalgias.       S/p 2005 fall from 2 story house  Skin: Negative for rash.  Neurological: Positive for dizziness and weakness. Negative for tingling, tremors, sensory change, speech change, focal weakness and loss of consciousness.  Endo/Heme/Allergies: Does not bruise/bleed easily.  Psychiatric/Behavioral: Negative for substance abuse. The patient is not nervous/anxious.   All other systems reviewed and are negative.   Labs:  Recent Labs  05/14/17 0939 05/14/17 0956  CKTOTAL  --  30*  TROPONINI 0.20*  --    Lab Results  Component Value Date   WBC 9.4 05/14/2017   HGB 13.3 05/14/2017   HCT 37.3 (L) 05/14/2017   MCV 85.3 05/14/2017   PLT 215 05/14/2017     Recent Labs Lab 05/14/17 0939  NA 132*  K 3.4*  CL 100*  CO2 22  BUN 32*  CREATININE 1.25*  CALCIUM 9.0  PROT 6.8  BILITOT 1.3*  ALKPHOS 75  ALT 27  AST 25  GLUCOSE 231*   Lab Results  Component Value Date   CHOL 139 01/27/2017   HDL 43.20 01/27/2017   LDLCALC 73 01/27/2017   TRIG 114.0 01/27/2017   No results found for: DDIMER  Radiology/Studies:   Ct Angio Chest Pe W Or Wo Contrast  Result Date: 05/14/2017 IMPRESSION: No pulmonary emboli or acute chest pathology by CT. Advanced emphysema. Advanced coronary artery calcification. Aortic Atherosclerosis (ICD10-I70.0) and Emphysema (ICD10-J43.9). Electronically Signed   By: Nelson Chimes M.D.   On: 05/14/2017 11:57     EKG: Interpreted by me showed: NSR 79 bpm, baseline artifact, no acute st/t changes Telemetry: Interpreted by me showed: NSR, 70s bpm  Weights: Filed Weights   05/14/17 0922  Weight: 223 lb (101.2 kg)     Physical Exam: Blood pressure  132/73, pulse 63, temperature 98.3 F (36.8 C), temperature source Oral, resp. rate 14, height 5\' 8"  (1.727 m), weight 223 lb (101.2 kg), SpO2 100 %. Body mass index is 33.91 kg/m. General: Well developed, well nourished, in no acute distress. Head: Normocephalic, atraumatic, sclera non-icteric, no xanthomas, nares are without discharge.  Neck: Negative for carotid bruits. JVD not elevated. Lungs: Clear bilaterally to auscultation without wheezes, rales, or rhonchi. Breathing is unlabored. Heart: RRR with S1 S2. No murmurs, rubs, or gallops appreciated. Abdomen: Soft, non-tender, non-distended with normoactive bowel sounds. No hepatomegaly. No rebound/guarding. No obvious abdominal masses. Msk:  Strength and tone appear normal for age. Extremities: No clubbing or cyanosis. No edema. Distal pedal pulses are 2+ and equal bilaterally. Neuro: Alert and oriented X 3. No facial asymmetry. No focal deficit. Moves all extremities spontaneously. Psych:  Responds to questions appropriately with a normal affect.    Assessment and Plan:  Principal Problem:   Orthostatic hypotension Active Problems:   Coronary artery disease involving native coronary artery without angina pectoris   Elevated troponin   GI bleed   Chronic respiratory failure with hypoxia (HCC)   COPD (chronic obstructive pulmonary disease) (HCC)   AKI (acute kidney injury) (HCC)   Hypokalemia   1. Elevated troponin/CAD: -Currently without chest pain -Troponin mildly elevated initially at 0.20, continue to trend to peak  -Possible demand ischemia in setting of underlying diffuse 3-vessel CAD with coronary calcifications, AKI, recent GI bleed with hemoconcentration, and hypotension -Hold on starting heparin gtt at this time unless dynamic troponin elevation is noted -No ASA given in the ED given recent GI bleed -Check TTE -Overall, this is a difficult situation given his recent GI bleed in the setting of known coronary  calcification and elevated troponin -If troponin trends upwards significantly he will likely require LHC this admission  -If troponin remains flat/down trends perhaps it would be best for him to undergo LHC as an outpatient  in a week or so once his HGB is deemed to be stable -Cannot rule out balanced ischemia on nuc given 3-vessel disease noted on CT chest -Not on beta blocker currently given orthostatic hypotension -Could consider inpatient nuc prior to discharge if troponin remains flat to evaluate for high-risk, clinically significant ischemia, though a low risk study would not be definitve in him   2. Near syncope, orthostatic hypotension, SOB: -Check orthostatic vital signs -IV fluids -Hold antihypertensives -Possibly exacerbated in the setting of recent HCTZ leading to dehydration -Cannot rule out persisting upper GI bleed, recommend checking for occult blood  3. Chronic respiratory failure with hypoxia/COPD: -Remains on supplemental oxygen at 3 L (same as home) -Consider worsening pulmonary status -Possibly exacerbated by his anemia with GI bleed -Per IM  4. Recent GI bleed: -CBC appears to be hemoconcentrated, in the setting of dehydration -Hydrate and recheck per IM -Check occult blood -Per IM  5. AKI: -Likely ATN in the setting of hypotension/anemia -Per IM  6. Hypokalemia: -Replete to goal of 4.0 -Magnesium pending  7. Remaining per IM   Signed, Christell Faith, PA-C  05/14/2017 3:08 PM [phone]

## 2017-05-14 NOTE — H&P (Addendum)
[facility] [place] NAME: [name]    MR#:  [id number]  DATE OF BIRTH:  08/02/1946  DATE OF ADMISSION:  05/14/2017  PRIMARY CARE PHYSICIAN: [name], MD   REQUESTING/REFERRING PHYSICIAN: Dr. Jimmye Norman  CHIEF COMPLAINT:   Chief Complaint  Patient presents with  . Dizziness    HISTORY OF PRESENT ILLNESS:  [name]  is a 71 y.o. male with a known history of COPD on 3 L oxygen ,Hypertension, diabetes, CAD, recent GI bleed with nonbleeding ulcers on EGD presents to the emergency room complaining of dizziness and orthostatic hypotension at home. Patient was recently in the hospital for GI bleed and COPD exacerbation and was started on oxygen and discharged home. His hemoglobin at discharge was 9.5. Due to hyperkalemia and his lisinopril was stopped and started on 12.5 mg hydrochlorothiazide. He has noticed dizziness on standing up and blood pressure drops from systolic 1 30 to 70 on standing up. He had some hemoptysis at discharge which has resolved. He is waiting for follow-up appointment at pulmonary Dr. Anastasia Pall office. Today patient has been found to have elevated troponin of 0.20. A CT scan of the chest shows no pulmonary embolism. Shows advanced emphysema. He has no chest pain. Does complain of some pain in his shoulders on exertion which is chronic. Some dark stools but no melena.  PAST MEDICAL HISTORY:   Past Medical History:  Diagnosis Date  . Aortic transection 2005   Traumatic after a fall from a second floor. s/p repair at [facility]  . Basal cell carcinoma   . CAD (coronary artery disease)   . Depression   . Diabetes mellitus    Type II  . Erectile dysfunction   . Fall 2005   fell off house: torn aorta, clavicle fracture, rib fracture, vertebral fractures, lung contusion, coma x 2 weeks  . Fracture of femoral neck, left ([place]) 08/09/2014   ORIF, Dr. Marry Guan  . Fracture of radial neck, left, closed 08/09/2014  . Gallstone  pancreatitis   . Gastric ulcer   . History of ATN 08/09/2014   [facility], 2 days of dialysis (ARF)  . History of bleeding ulcers   . Hyperlipidemia    Statin with joint pain   . Hypertension   . MI (myocardial infarction) ([place]) 1992   Acute anterior MI, thrombolytic therapy  . Peripheral vascular disease [facility])    Atherosclerotic:R renal artery stenosis    PAST SURGICAL HISTORY:   Past Surgical History:  Procedure Laterality Date  . CARDIAC CATHETERIZATION  08/1991   50 % mid-Lad stenosis with clot, 25-505 second marginal  . CARDIOVASCULAR SURGERY     with ruptured Aorta, Dr. Camila Li, [facility]   . CHOLECYSTECTOMY    . ESOPHAGOGASTRODUODENOSCOPY (EGD) WITH PROPOFOL N/A 05/03/2017   Procedure: ESOPHAGOGASTRODUODENOSCOPY (EGD) WITH PROPOFOL;  Surgeon: Lucilla Lame, MD;  Location: [facility] ENDOSCOPY;  Service: Endoscopy;  Laterality: N/A;  . KNEE SURGERY     Right   . ORIF HIP FRACTURE Left 08/12/2014   Dr. Marry Guan  . THORACOTOMY     thoracic aorta repair   . TRACHEOSTOMY     s/p reversal    SOCIAL HISTORY:   Social History  Substance Use Topics  . Smoking status: Former Smoker    Packs/day: 1.50    Years: 20.00    Types: Cigarettes    Quit date: 09/13/1991  . Smokeless tobacco: Never Used     Comment: quit post MI  . Alcohol  use Yes    FAMILY HISTORY:   Family History  Problem Relation Age of Onset  . Dementia Mother 47  . Heart attack Father 6  . Diabetes Brother 26  . Heart attack Brother   . Colon cancer Neg Hx   . Stomach cancer Neg Hx   . Esophageal cancer Neg Hx   . Rectal cancer Neg Hx     DRUG ALLERGIES:   Allergies  Allergen Reactions  . Xarelto [Rivaroxaban] Other (See Comments)    Reaction: GI Bleed  . Clindamycin/Lincomycin Rash  . Dye Fdc Red [Red Dye] Rash  . Lovenox [Enoxaparin Sodium]     Rash, blackness at injection site. Multifactorial and multiple other drugs used at the same time.  [name] Kitchen Penicillins Rash and Other (See Comments)    Has patient  had a PCN reaction causing immediate rash, facial/tongue/throat swelling, SOB or lightheadedness with hypotension: Unknown Has patient had a PCN reaction causing severe rash involving mucus membranes or skin necrosis: Unknown Has patient had a PCN reaction that required hospitalization: Unknown Has patient had a PCN reaction occurring within the last 10 years: No If all of the above answers are "NO", then may proceed with Cephalosporin use.     REVIEW OF SYSTEMS:   Review of Systems  Constitutional: Positive for malaise/fatigue. Negative for chills, fever and weight loss.  HENT: Negative for hearing loss and nosebleeds.   Eyes: Negative for blurred vision, double vision and pain.  Respiratory: Positive for shortness of breath. Negative for cough, hemoptysis, sputum production and wheezing.   Cardiovascular: Negative for chest pain, palpitations, orthopnea and leg swelling.  Gastrointestinal: Negative for abdominal pain, constipation, diarrhea, nausea and vomiting.  Genitourinary: Negative for dysuria and hematuria.  Musculoskeletal: Negative for back pain, falls and myalgias.  Skin: Negative for rash.  Neurological: Positive for dizziness and weakness. Negative for tremors, sensory change, speech change, focal weakness, seizures and headaches.  Endo/Heme/Allergies: Does not bruise/bleed easily.  Psychiatric/Behavioral: Negative for depression and memory loss. The patient is not nervous/anxious.     MEDICATIONS AT HOME:   Prior to Admission medications   Medication Sig Start Date End Date Taking? Authorizing Provider  amLODipine (NORVASC) 5 MG tablet Take 5 mg by mouth daily.   Yes [provider]  atorvastatin (LIPITOR) 40 MG tablet Take 1 tablet (40 mg total) by mouth daily. 04/24/17  Yes Copland, Frederico Hamman, MD  citalopram (CELEXA) 20 MG tablet TAKE ONE TABLET EVERY DAY 12/23/16  Yes Copland, Frederico Hamman, MD  ferrous sulfate 325 (65 FE) MG EC tablet Take 1 tablet (325 mg total) by  mouth daily with breakfast. 05/07/17 05/07/18 Yes Mardy Hoppe, MD  hydrochlorothiazide (HYDRODIURIL) 12.5 MG tablet Take 1 tablet (12.5 mg total) by mouth daily. 05/07/17  Yes Simranjit Thayer, Alveta Heimlich, MD  metFORMIN (GLUCOPHAGE-XR) 500 MG 24 hr tablet TAKE ONE TABLET BY MOUTH EVERY DAY 04/19/17  Yes Copland, Frederico Hamman, MD  pantoprazole (PROTONIX) 40 MG tablet Take 1 tablet (40 mg total) by mouth daily. 05/07/17  Yes Alyric Parkin, Alveta Heimlich, MD  Tiotropium Bromide-Olodaterol (STIOLTO RESPIMAT) 2.5-2.5 MCG/ACT AERS Inhale 2 puffs into the lungs daily. 03/22/17  Yes Parrett, Tammy S, NP  predniSONE (DELTASONE) 20 MG tablet Take 1 tablet (20 mg total) by mouth 2 (two) times daily. Patient not taking: Reported on 05/14/2017 05/08/17   Hillary Bow, MD     VITAL SIGNS:  Blood pressure 132/73, pulse 63, temperature 98.3 F (36.8 C), temperature source Oral, resp. rate 14, height 5\' 8"  (1.727 m), weight  101.2 kg (223 lb), SpO2 100 %.  PHYSICAL EXAMINATION:  Physical Exam  GENERAL:  71 y.o.-year-old patient lying in the bed with no acute distress.  EYES: Pupils equal, round, reactive to light and accommodation. No scleral icterus. Extraocular muscles intact.  HEENT: Head atraumatic, normocephalic. Oropharynx and nasopharynx clear. No oropharyngeal erythema, moist oral mucosa  NECK:  Supple, no jugular venous distention. No thyroid enlargement, no tenderness.  LUNGS: Normal breath sounds bilaterally, no wheezing, rales, rhonchi. No use of accessory muscles of respiration.  CARDIOVASCULAR: S1, S2 normal. No murmurs, rubs, or gallops.  ABDOMEN: Soft, nontender, nondistended. Bowel sounds present. No organomegaly or mass.  EXTREMITIES: No pedal edema, cyanosis, or clubbing. + 2 pedal & radial pulses b/l.   NEUROLOGIC: Cranial nerves II through XII are intact. No focal Motor or sensory deficits appreciated b/l PSYCHIATRIC: The patient is alert and oriented x 3. Good affect.  SKIN: No obvious rash, lesion, or ulcer.   LABORATORY  PANEL:   CBC  Recent Labs Lab 05/14/17 0939  WBC 9.4  HGB 13.3  HCT 37.3*  PLT 215   ------------------------------------------------------------------------------------------------------------------  Chemistries   Recent Labs Lab 05/14/17 0939  NA 132*  K 3.4*  CL 100*  CO2 22  GLUCOSE 231*  BUN 32*  CREATININE 1.25*  CALCIUM 9.0  AST 25  ALT 27  ALKPHOS 75  BILITOT 1.3*   ------------------------------------------------------------------------------------------------------------------  Cardiac Enzymes  Recent Labs Lab 05/14/17 0939  TROPONINI 0.20*   ------------------------------------------------------------------------------------------------------------------  RADIOLOGY:  Ct Angio Chest Pe W Or Wo Contrast  Result Date: 05/14/2017 CLINICAL DATA:  Poor oxygen saturation. Worsening shortness of breath. EXAM: CT ANGIOGRAPHY CHEST WITH CONTRAST TECHNIQUE: Multidetector CT imaging of the chest was performed using the standard protocol during bolus administration of intravenous contrast. Multiplanar CT image reconstructions and MIPs were obtained to evaluate the vascular anatomy. CONTRAST:  75 cc Isovue 370 COMPARISON:  Chest radiography 05/07/2017.  CT 02/19/2017. FINDINGS: Cardiovascular: Pulmonary arterial opacification is excellent. No pulmonary emboli. Heart size is normal. No pericardial fluid. Extensive coronary artery calcification. Aortic atherosclerosis without aneurysm or dissection. Mediastinum/Nodes: No mass or lymphadenopathy. Lungs/Pleura: Advanced emphysema, upper lobe predominant. No sign of infiltrate, mass, effusion or collapse. Upper Abdomen: No acute or significant finding. Previous cholecystectomy. Musculoskeletal: Chronic thoracic degenerative changes. Review of the MIP images confirms the above findings. IMPRESSION: No pulmonary emboli or acute chest pathology by CT. Advanced emphysema. Advanced coronary artery calcification. Aortic  Atherosclerosis (ICD10-I70.0) and Emphysema (ICD10-J43.9). Electronically Signed   By: Nelson Chimes M.D.   On: 05/14/2017 11:57     IMPRESSION AND PLAN:   * Elevated troponin. Etiology unclear. But no chest pain. Could be due to significant hypotension in setting of CAD. We will also check CK level to rule out rhabdomyolysis. Will admit patient with telemetry monitoring. Repeat troponin. Discussed with Dr. Rockey Situ of cardiology. Hesitant to start aspirin as patient had recent GI bleed. Also mentions his stool is getting darker. Further management as per troponin trend and cardiology input He did have a normal stress test in October 2017.  * Orthostatic hypotension. Likely due to hydrochlorothiazide that was started recently. We will stop HCTZ. Bolus normal saline. Continue IV fluids for another day. Repeat orthostats tomorrow.  * Recent GI bleed. His hemoglobin is improved from the time of discharge. Likely hemoconcentrated from dehydration. He does say he has some dark stools but no melena. We will have stool checked for occult blood. Repeat hemoglobin. Consult GI if there is any blood in  stool or drop in hemoglobin significantly.  * Hypertension. Continue Norvasc. Discontinue HCTZ.  * Diabetes mellitus. Continue metformin. Added sliding scale insulin.  * DVT prophylaxis with SCDs  All the records are reviewed and case discussed with ED provider. Management plans discussed with the patient, family and they are in agreement.  CODE STATUS: FULL CODE  TOTAL TIME TAKING CARE OF THIS PATIENT: 40 minutes.   Hillary Bow R M.D on 05/14/2017 at 1:50 PM  Between 7am to 6pm - Pager - [phone]  After 6pm go to [email or web address] - password [email or web address] [facility]  Office  [phone]  CC: Primary care physician; [name], MD  Note: This dictation was prepared with Dragon dictation along with smaller phrase technology. Any transcriptional errors that result from  this process are unintentional.

## 2017-05-14 NOTE — ED Triage Notes (Signed)
Pt with wife to ED with reports since yesterday pt has been feeling dizzy upon standing. Wife has checked BP while lying and then sitting with significant changes in pressure. Pt denies pain. Was discharged from hospital Saturday.

## 2017-05-14 NOTE — Progress Notes (Signed)
Admitted from the ED. Telemetry started. Dr. Darvin Neighbours notified.  Ordered benadryl

## 2017-05-14 NOTE — ED Notes (Signed)
Over the last few weeks pt has been admitted for "bleeding ulcers" per family. Pt reports recent sob with ambulation and subsequently reports he has recently been placed on home o2 at 3 lpm to maintain sats.  Pt alert and oriented but reports yesterday and today increased dizziness with standing, progressively getting worse.  Pt family at bedside. MD at bedside at this time. Upon arrival to room pt placed on cm, ekg done and shown to dr Jimmye Norman.

## 2017-05-14 NOTE — Telephone Encounter (Signed)
Patients wife called and stated that [name] has an appt with Dr. Allen Norris in July. When he stands up she said his blood pressure was dropping. I told her Dr. Allen Norris was in procedures this morning and the nurses where away at a meeting that she should get him to the ED

## 2017-05-15 LAB — CBC WITH DIFFERENTIAL/PLATELET
Basophils Absolute: 0.1 10*3/uL (ref 0–0.1)
Basophils Relative: 0 %
Eosinophils Absolute: 0.2 10*3/uL (ref 0–0.7)
Eosinophils Relative: 1 %
HCT: 36 % — ABNORMAL LOW (ref 40.0–52.0)
Hemoglobin: 12.2 g/dL — ABNORMAL LOW (ref 13.0–18.0)
Lymphocytes Relative: 1 %
Lymphs Abs: 0.5 10*3/uL — ABNORMAL LOW (ref 1.0–3.6)
MCH: 29.4 pg (ref 26.0–34.0)
MCHC: 33.9 g/dL (ref 32.0–36.0)
MCV: 86.6 fL (ref 80.0–100.0)
Monocytes Absolute: 0.7 10*3/uL (ref 0.2–1.0)
Monocytes Relative: 2 %
Neutro Abs: 34.6 10*3/uL — ABNORMAL HIGH (ref 1.4–6.5)
Neutrophils Relative %: 96 %
Platelets: 166 10*3/uL (ref 150–440)
RBC: 4.15 MIL/uL — ABNORMAL LOW (ref 4.40–5.90)
RDW: 15 % — ABNORMAL HIGH (ref 11.5–14.5)
WBC: 36.1 10*3/uL — ABNORMAL HIGH (ref 3.8–10.6)

## 2017-05-15 LAB — BASIC METABOLIC PANEL WITH GFR
Anion gap: 5 (ref 5–15)
BUN: 29 mg/dL — ABNORMAL HIGH (ref 6–20)
CO2: 23 mmol/L (ref 22–32)
Calcium: 8 mg/dL — ABNORMAL LOW (ref 8.9–10.3)
Chloride: 106 mmol/L (ref 101–111)
Creatinine, Ser: 1.09 mg/dL (ref 0.61–1.24)
GFR calc Af Amer: >60 mL/min
GFR calc non Af Amer: >60 mL/min
Glucose, Bld: 159 mg/dL — ABNORMAL HIGH (ref 65–99)
Potassium: 4.1 mmol/L (ref 3.5–5.1)
Sodium: 134 mmol/L — ABNORMAL LOW (ref 135–145)

## 2017-05-15 LAB — GLUCOSE, CAPILLARY
Glucose-Capillary: 148 mg/dL — ABNORMAL HIGH (ref 65–99)
Glucose-Capillary: 160 mg/dL — ABNORMAL HIGH (ref 65–99)
Glucose-Capillary: 168 mg/dL — ABNORMAL HIGH (ref 65–99)
Glucose-Capillary: 150 mg/dL — ABNORMAL HIGH (ref 65–99)

## 2017-05-15 LAB — CBC
HCT: 35.9 % — ABNORMAL LOW (ref 40.0–52.0)
Hemoglobin: 12.5 g/dL — ABNORMAL LOW (ref 13.0–18.0)
MCH: 30 pg (ref 26.0–34.0)
MCHC: 34.8 g/dL (ref 32.0–36.0)
MCV: 86.3 fL (ref 80.0–100.0)
Platelets: 171 10*3/uL (ref 150–440)
RBC: 4.16 MIL/uL — ABNORMAL LOW (ref 4.40–5.90)
RDW: 14.5 % (ref 11.5–14.5)
WBC: 40.8 10*3/uL — ABNORMAL HIGH (ref 3.8–10.6)

## 2017-05-15 LAB — ECHOCARDIOGRAM COMPLETE
Height: 68 in
Weight: 3568 [oz_av]

## 2017-05-15 MED ORDER — SODIUM CHLORIDE 0.9 % IV SOLN
INTRAVENOUS | Status: AC
  Administered 2017-05-15: 12:00:00 via INTRAVENOUS

## 2017-05-15 NOTE — Progress Notes (Signed)
[facility] [place] NAME: [name]    MR#:  [id number]  DATE OF BIRTH:  May 01, 1946  SUBJECTIVE:  CHIEF COMPLAINT:   Chief Complaint  Patient presents with  . Dizziness   Still feels weak. Dizziness on standing up. Had large bowel movement and was not dark. No blood. No chest pain. Chronic shortness of breath is the same.  Had significant flushing and redness all over his face and trunk yesterday after receiving IV contrast for the CT scan. This is improved.  REVIEW OF SYSTEMS:    Review of Systems  Constitutional: Positive for malaise/fatigue. Negative for chills, fever and weight loss.  HENT: Negative for hearing loss and nosebleeds.   Eyes: Negative for blurred vision, double vision and pain.  Respiratory: Positive for shortness of breath. Negative for cough, hemoptysis, sputum production and wheezing.   Cardiovascular: Negative for chest pain, palpitations, orthopnea and leg swelling.  Gastrointestinal: Negative for abdominal pain, constipation, diarrhea, nausea and vomiting.  Genitourinary: Negative for dysuria and hematuria.  Musculoskeletal: Negative for back pain, falls and myalgias.  Skin: Negative for rash.  Neurological: Positive for dizziness and weakness. Negative for tremors, sensory change, speech change, focal weakness, seizures and headaches.  Endo/Heme/Allergies: Does not bruise/bleed easily.  Psychiatric/Behavioral: Negative for depression and memory loss. The patient is not nervous/anxious.     DRUG ALLERGIES:   Allergies  Allergen Reactions  . Xarelto [Rivaroxaban] Other (See Comments)    Reaction: GI Bleed  . Clindamycin/Lincomycin Rash  . Dye Fdc Red [Red Dye] Rash  . Lovenox [Enoxaparin Sodium]     Rash, blackness at injection site. Multifactorial and multiple other drugs used at the same time.  [name] Kitchen Penicillins Rash and Other (See Comments)    Has patient had a PCN reaction causing immediate rash,  facial/tongue/throat swelling, SOB or lightheadedness with hypotension: Unknown Has patient had a PCN reaction causing severe rash involving mucus membranes or skin necrosis: Unknown Has patient had a PCN reaction that required hospitalization: Unknown Has patient had a PCN reaction occurring within the last 10 years: No If all of the above answers are "NO", then may proceed with Cephalosporin use.     VITALS:  Blood pressure (!) 129/54, pulse 73, temperature 98.1 F (36.7 C), temperature source Oral, resp. rate 16, height 5\' 8"  (1.727 m), weight 94.4 kg (208 lb 3.2 oz), SpO2 99 %.  PHYSICAL EXAMINATION:   Physical Exam  GENERAL:  71 y.o.-year-old patient lying in the bed with no acute distress.  EYES: Pupils equal, round, reactive to light and accommodation. No scleral icterus. Extraocular muscles intact.  HEENT: Head atraumatic, normocephalic. Oropharynx and nasopharynx clear.  NECK:  Supple, no jugular venous distention. No thyroid enlargement, no tenderness.  LUNGS: Normal breath sounds bilaterally, no wheezing, rales, rhonchi. No use of accessory muscles of respiration.  CARDIOVASCULAR: S1, S2 normal. No murmurs, rubs, or gallops.  ABDOMEN: Soft, nontender, nondistended. Bowel sounds present. No organomegaly or mass.  EXTREMITIES: No cyanosis, clubbing or edema b/l.    NEUROLOGIC: Cranial nerves II through XII are intact. No focal Motor or sensory deficits b/l.   PSYCHIATRIC: The patient is alert and oriented x 3.  SKIN: No obvious rash, lesion, or ulcer.   LABORATORY PANEL:   CBC  Recent Labs Lab 05/15/17 0849  WBC 36.1*  HGB 12.2*  HCT 36.0*  PLT 166   ------------------------------------------------------------------------------------------------------------------ Chemistries   Recent Labs Lab 05/14/17 0939 05/14/17 1650 05/15/17 0648  NA 132*  --  134*  K 3.4*  --  4.1  CL 100*  --  106  CO2 22  --  23  GLUCOSE 231*  --  159*  BUN 32*  --  29*   CREATININE 1.25*  --  1.09  CALCIUM 9.0  --  8.0*  MG  --  1.9  --   AST 25  --   --   ALT 27  --   --   ALKPHOS 75  --   --   BILITOT 1.3*  --   --    ------------------------------------------------------------------------------------------------------------------  Cardiac Enzymes  Recent Labs Lab 05/14/17 2232  TROPONINI 0.16*   ------------------------------------------------------------------------------------------------------------------  RADIOLOGY:  Ct Angio Chest Pe W Or Wo Contrast  Result Date: 05/14/2017 CLINICAL DATA:  Poor oxygen saturation. Worsening shortness of breath. EXAM: CT ANGIOGRAPHY CHEST WITH CONTRAST TECHNIQUE: Multidetector CT imaging of the chest was performed using the standard protocol during bolus administration of intravenous contrast. Multiplanar CT image reconstructions and MIPs were obtained to evaluate the vascular anatomy. CONTRAST:  75 cc Isovue 370 COMPARISON:  Chest radiography 05/07/2017.  CT 02/19/2017. FINDINGS: Cardiovascular: Pulmonary arterial opacification is excellent. No pulmonary emboli. Heart size is normal. No pericardial fluid. Extensive coronary artery calcification. Aortic atherosclerosis without aneurysm or dissection. Mediastinum/Nodes: No mass or lymphadenopathy. Lungs/Pleura: Advanced emphysema, upper lobe predominant. No sign of infiltrate, mass, effusion or collapse. Upper Abdomen: No acute or significant finding. Previous cholecystectomy. Musculoskeletal: Chronic thoracic degenerative changes. Review of the MIP images confirms the above findings. IMPRESSION: No pulmonary emboli or acute chest pathology by CT. Advanced emphysema. Advanced coronary artery calcification. Aortic Atherosclerosis (ICD10-I70.0) and Emphysema (ICD10-J43.9). Electronically Signed   By: Nelson Chimes M.D.   On: 05/14/2017 11:57     ASSESSMENT AND PLAN:   * Severe leukocytosis. Etiology is unclear. No bandemia. No fever. No source of infection. Likely  due to stress reaction from allergy reaction to the IV dye. We will monitor. Repeat labs in the morning.  * Elevated troponin. Likely due to significant hypotension in setting of CAD. Troponin has trended down. No chest pain. Patient cardiology input. Echocardiogram has been repeated and we will wait for results. Will likely need ischemic cardiac workup as outpatient.  * Orthostatic hypotension Likely due to hydrochlorothiazide and decreased oral intake. Improving. Continue IV fluids.  * Recent GI bleed due to gastric ulcers. No further black stools. Hemoglobin stable. Will need outpatient GI follow-up.  * Hypertension.  Hydrochlorothiazide discontinued due to dehydration. Hold Norvasc due to hypotension.  * Diabetes mellitus. Continue metformin. Added sliding scale insulin.  * DVT prophylaxis with SCDs  All the records are reviewed and case discussed with Care Management/Social Worker Management plans discussed with the patient, family and they are in agreement.  CODE STATUS: FULL CODE  DVT Prophylaxis: SCDs  TOTAL TIME TAKING CARE OF THIS PATIENT: 35 minutes.   Likely discharge tomorrow  Hillary Bow R M.D on 05/15/2017 at 1:37 PM  Between 7am to 6pm - Pager - [phone]  After 6pm go to [email or web address] - password [email or web address] [facility]  Office  [phone]  CC: Primary care physician; [name], MD  Note: This dictation was prepared with Dragon dictation along with smaller phrase technology. Any transcriptional errors that result from this process are unintentional.

## 2017-05-15 NOTE — Progress Notes (Signed)
Patient remains orthostatic.  BP lying was 119/51  , BP sitting was 102/62,  BP standing was 86/48 . He did not however become light headed or dizzy when he stood as he did yesterday

## 2017-05-16 LAB — CBC WITH DIFFERENTIAL/PLATELET
Basophils Absolute: 0 10*3/uL (ref 0–0.1)
Basophils Relative: 0 %
Eosinophils Absolute: 0.4 10*3/uL (ref 0–0.7)
Eosinophils Relative: 2 %
HCT: 33.4 % — ABNORMAL LOW (ref 40.0–52.0)
Hemoglobin: 11.6 g/dL — ABNORMAL LOW (ref 13.0–18.0)
Lymphocytes Relative: 6 %
Lymphs Abs: 1.1 10*3/uL (ref 1.0–3.6)
MCH: 30.5 pg (ref 26.0–34.0)
MCHC: 34.8 g/dL (ref 32.0–36.0)
MCV: 87.6 fL (ref 80.0–100.0)
Monocytes Absolute: 0.6 10*3/uL (ref 0.2–1.0)
Monocytes Relative: 3 %
Neutro Abs: 16.6 10*3/uL — ABNORMAL HIGH (ref 1.4–6.5)
Neutrophils Relative %: 89 %
Platelets: 150 10*3/uL (ref 150–440)
RBC: 3.81 MIL/uL — ABNORMAL LOW (ref 4.40–5.90)
RDW: 14.7 % — ABNORMAL HIGH (ref 11.5–14.5)
WBC: 18.8 10*3/uL — ABNORMAL HIGH (ref 3.8–10.6)

## 2017-05-16 LAB — GLUCOSE, CAPILLARY
Glucose-Capillary: 110 mg/dL — ABNORMAL HIGH (ref 65–99)
Glucose-Capillary: 172 mg/dL — ABNORMAL HIGH (ref 65–99)

## 2017-05-16 NOTE — Discharge Instructions (Addendum)
Resume diet and activity as before  Oxygen 2 L/Min  You can restart taking the baby aspirin    Dehydration, Adult Dehydration is when there is not enough fluid or water in your body. This happens when you lose more fluids than you take in. Dehydration can range from mild to very bad. It should be treated right away to keep it from getting very bad. Symptoms of mild dehydration may include:  Thirst.  Dry lips.  Slightly dry mouth.  Dry, warm skin.  Dizziness. Symptoms of moderate dehydration may include:  Very dry mouth.  Muscle cramps.  Dark pee (urine). Pee may be the color of tea.  Your body making less pee.  Your eyes making fewer tears.  Heartbeat that is uneven or faster than normal (palpitations).  Headache.  Light-headedness, especially when you stand up from sitting.  Fainting (syncope). Symptoms of very bad dehydration may include:  Changes in skin, such as: ? Cold and clammy skin. ? Blotchy (mottled) or pale skin. ? Skin that does not quickly return to normal after being lightly pinched and let go (poor skin turgor).  Changes in body fluids, such as: ? Feeling very thirsty. ? Your eyes making fewer tears. ? Not sweating when body temperature is high, such as in hot weather. ? Your body making very little pee.  Changes in vital signs, such as: ? Weak pulse. ? Pulse that is more than 100 beats a minute when you are sitting still. ? Fast breathing. ? Low blood pressure.  Other changes, such as: ? Sunken eyes. ? Cold hands and feet. ? Confusion. ? Lack of energy (lethargy). ? Trouble waking up from sleep. ? Short-term weight loss. ? Unconsciousness. Follow these instructions at home:  If told by your doctor, drink an ORS: ? Make an ORS by using instructions on the package. ? Start by drinking small amounts, about  cup (120 mL) every 5-10 minutes. ? Slowly drink more until you have had the amount that your doctor said to have.  Drink  enough clear fluid to keep your pee clear or pale yellow. If you were told to drink an ORS, finish the ORS first, then start slowly drinking clear fluids. Drink fluids such as: ? Water. Do not drink only water by itself. Doing that can make the salt (sodium) level in your body get too low (hyponatremia). ? Ice chips. ? Fruit juice that you have added water to (diluted). ? Low-calorie sports drinks.  Avoid: ? Alcohol. ? Drinks that have a lot of sugar. These include high-calorie sports drinks, fruit juice that does not have water added, and soda. ? Caffeine. ? Foods that are greasy or have a lot of fat or sugar.  Take over-the-counter and prescription medicines only as told by your doctor.  Do not take salt tablets. Doing that can make the salt level in your body get too high (hypernatremia).  Eat foods that have minerals (electrolytes). Examples include bananas, oranges, potatoes, tomatoes, and spinach.  Keep all follow-up visits as told by your doctor. This is important. Contact a doctor if:  You have belly (abdominal) pain that: ? Gets worse. ? Stays in one area (localizes).  You have a rash.  You have a stiff neck.  You get angry or annoyed more easily than normal (irritability).  You are more sleepy than normal.  You have a harder time waking up than normal.  You feel: ? Weak. ? Dizzy. ? Very thirsty.  You have peed (urinated)  only a small amount of very dark pee during 6-8 hours. Get help right away if:  You have symptoms of very bad dehydration.  You cannot drink fluids without throwing up (vomiting).  Your symptoms get worse with treatment.  You have a fever.  You have a very bad headache.  You are throwing up or having watery poop (diarrhea) and it: ? Gets worse. ? Does not go away.  You have blood or something green (bile) in your throw-up.  You have blood in your poop (stool). This may cause poop to look black and tarry.  You have not peed in 6-8  hours.  You pass out (faint).  Your heart rate when you are sitting still is more than 100 beats a minute.  You have trouble breathing. This information is not intended to replace advice given to you by your health care provider. Make sure you discuss any questions you have with your health care provider. Document Released: 09/12/2009 Document Revised: 06/05/2016 Document Reviewed: 01/10/2016 Elsevier Interactive Patient Education  2018 [employer].

## 2017-05-16 NOTE — Progress Notes (Signed)
Patient discharged via wheelchair and private vehicle. IV removed and catheter intact. All discharge instructions given and patient verbalizes understanding. Tele removed and returned. No prescriptions given to patient No distress noted.   

## 2017-05-17 ENCOUNTER — Telehealth: Payer: Self-pay | Admitting: Cardiovascular Disease

## 2017-05-17 NOTE — Telephone Encounter (Signed)
Patient contacted regarding discharge from [facility] on 05/16/17.  Patient understands to follow up with Ignacia Bayley, NP on 05/19/17 at 11:30 at [facility]. Patient understands discharge instructions? yes Patient understands medications and regimen? yes Patient understands to bring all medications to this visit? yes  Pt's wife reports that he is doing well, she is just concerned about orthostatic pressures. She holds pt's BP meds when his BP runs low.

## 2017-05-17 NOTE — Telephone Encounter (Signed)
TCM..the patient wife called  They are coming in this week 05/19/17 to Ignacia Bayley  They saw Dr Rockey Situ in hospital former patient of Dr Yvone Neu

## 2017-05-19 ENCOUNTER — Encounter: Payer: Self-pay | Admitting: Nurse Practitioner

## 2017-05-19 ENCOUNTER — Ambulatory Visit (INDEPENDENT_AMBULATORY_CARE_PROVIDER_SITE_OTHER): Payer: PPO | Admitting: Nurse Practitioner

## 2017-05-19 VITALS — BP 138/60 | HR 73 | Ht 68.0 in | Wt 215.0 lb

## 2017-05-19 DIAGNOSIS — I2510 Atherosclerotic heart disease of native coronary artery without angina pectoris: Secondary | ICD-10-CM

## 2017-05-19 DIAGNOSIS — E785 Hyperlipidemia, unspecified: Secondary | ICD-10-CM

## 2017-05-19 DIAGNOSIS — Z8719 Personal history of other diseases of the digestive system: Secondary | ICD-10-CM

## 2017-05-19 DIAGNOSIS — I10 Essential (primary) hypertension: Secondary | ICD-10-CM | POA: Diagnosis not present

## 2017-05-19 MED ORDER — NITROGLYCERIN 0.4 MG SL SUBL: 0.4000 mg | SUBLINGUAL_TABLET | SUBLINGUAL | 6 refills | Status: AC | PRN
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
## 2017-05-19 NOTE — Patient Instructions (Signed)
Medication Instructions:  Please STOP amlodipine Rx sent in for Nitro - please follow directions on bottle in case of chest pain  Labwork: None  Testing/Procedures: None  Follow-Up: 3 months w/ Dr. Rockey Situ  If you need a refill on your cardiac medications before your next appointment, please call your pharmacy.

## 2017-05-20 ENCOUNTER — Ambulatory Visit (INDEPENDENT_AMBULATORY_CARE_PROVIDER_SITE_OTHER): Payer: PPO | Admitting: Family Medicine

## 2017-05-20 ENCOUNTER — Encounter: Payer: Self-pay | Admitting: Family Medicine

## 2017-05-20 VITALS — BP 140/70 | HR 79 | Temp 97.9°F | Ht 68.0 in | Wt 214.5 lb

## 2017-05-20 DIAGNOSIS — K254 Chronic or unspecified gastric ulcer with hemorrhage: Secondary | ICD-10-CM

## 2017-05-20 DIAGNOSIS — E860 Dehydration: Secondary | ICD-10-CM | POA: Diagnosis not present

## 2017-05-20 DIAGNOSIS — D508 Other iron deficiency anemias: Secondary | ICD-10-CM

## 2017-05-20 DIAGNOSIS — N179 Acute kidney failure, unspecified: Secondary | ICD-10-CM

## 2017-05-20 DIAGNOSIS — Z79899 Other long term (current) drug therapy: Secondary | ICD-10-CM | POA: Diagnosis not present

## 2017-05-20 DIAGNOSIS — J432 Centrilobular emphysema: Secondary | ICD-10-CM | POA: Diagnosis not present

## 2017-05-20 LAB — BASIC METABOLIC PANEL WITH GFR
BUN: 15 mg/dL (ref 6–23)
CO2: 25 meq/L (ref 19–32)
Calcium: 9.4 mg/dL (ref 8.4–10.5)
Chloride: 107 meq/L (ref 96–112)
Creatinine, Ser: 0.92 mg/dL (ref 0.40–1.50)
GFR: 86.45 mL/min
Glucose, Bld: 163 mg/dL — ABNORMAL HIGH (ref 70–99)
Potassium: 4.8 meq/L (ref 3.5–5.1)
Sodium: 138 meq/L (ref 135–145)

## 2017-05-20 LAB — CBC WITH DIFFERENTIAL/PLATELET
Basophils Absolute: 0.1 10*3/uL (ref 0.0–0.1)
Basophils Relative: 1.3 % (ref 0.0–3.0)
Eosinophils Absolute: 0.2 10*3/uL (ref 0.0–0.7)
Eosinophils Relative: 2.7 % (ref 0.0–5.0)
HCT: 33.8 % — ABNORMAL LOW (ref 39.0–52.0)
Hemoglobin: 11.6 g/dL — ABNORMAL LOW (ref 13.0–17.0)
Lymphocytes Relative: 13.2 % (ref 12.0–46.0)
Lymphs Abs: 0.9 10*3/uL (ref 0.7–4.0)
MCHC: 34.2 g/dL (ref 30.0–36.0)
MCV: 89 fl (ref 78.0–100.0)
Monocytes Absolute: 0.5 10*3/uL (ref 0.1–1.0)
Monocytes Relative: 6.4 % (ref 3.0–12.0)
Neutro Abs: 5.4 10*3/uL (ref 1.4–7.7)
Neutrophils Relative %: 76.4 % (ref 43.0–77.0)
Platelets: 222 10*3/uL (ref 150.0–400.0)
RBC: 3.8 Mil/uL — ABNORMAL LOW (ref 4.22–5.81)
RDW: 14.5 % (ref 11.5–15.5)
WBC: 7.1 10*3/uL (ref 4.0–10.5)

## 2017-05-20 MED ORDER — CITALOPRAM HYDROBROMIDE 10 MG PO TABS: 10.0000 mg | ORAL_TABLET | Freq: Every day | ORAL | 1 refills | Status: DC

## 2017-05-20 NOTE — Progress Notes (Signed)
Do  Dr. Frederico Hamman T. Ugochi Henzler, MD, [facility] [place], [address] Phone: [phone] Fax: [phone]  05/20/2017  Patient: [name], MRN: [id number], DOB: 1946-10-13, 71 y.o.  Primary Physician:  [name], MD   Chief Complaint  Patient presents with  . Hospitalization Follow-up   Subjective:   [name] is a 71 y.o. very pleasant male patient who presents with the following:  TCM Visit.  Initial admission: 05/02/2017 Date of discharge: 05/08/2017  Primary reason for admission on that visit was GI bleed secondary to gastric ulcer and duodenal ulcer. with anemia.  Patient had melena, which brought him to the hospital. He was additionally having some additional epigastric pain.  His hemoglobin dropped from 15 a few months ago to 11 while checked on admission.  He was still having active bleeding at the time of evaluation in the emergency room, so he was admitted.  Additionally, he had some hemoptysis, and pulmonary was consulted.  He has outpatient follow-up set up.  He also had some shock, felt likely to be from hypovolemia.  He was placed on antibiotics, Levaquin for pulmonary infection, probable bronchitis.  Did have negative blood cultures.  At the time of discharge, he was placed on iron supplements as well as PPI with GI follow-up.  Melena had resolved at the time of his discharge.  Secondary Admission:  05/14/2017 Admission. It appears that he was discharged on May 16, 2017, but the discharge summary is not available.  At that time, the patient presented to the emergency room feeling dizzy and having orthostatic hypotension at home.  Recently his blood pressure medication had been changed by the inpatient team.  His hospital sensation discharge hemoglobin was 9.5.  At admission his troponin was increased to 0.2.  Additionally they did a CT of his chest which did not show any kind of pulmonary embolism.  At that point, the patient was also had some mild renal failure with a creatinine at 1.25 on admission  - up from his baseline.  I'll in the hospital, rehydrated the patient and stopped his amlodipine.  Cardiology felt that the elevated troponin was demand ischemia.  Past Medical History, Surgical History, Social History, Family History, Problem List, Medications, and Allergies have been reviewed and updated if relevant.  Patient Active Problem List   Diagnosis Date Noted  . Orthostatic hypotension 05/14/2017  . COPD (chronic obstructive pulmonary disease) ([place]) 05/14/2017  . AKI (acute kidney injury) ([place]) 05/14/2017  . Hypokalemia 05/14/2017  . Melena 05/02/2017  . Radial neck fracture 09/14/2014  . Fracture of femoral neck, left ([place]) 09/14/2014  . Centrilobular emphysema ([place]) 09/12/2014  . Aortic transection   . Peripheral vascular disease ([place])   . History of MI (myocardial infarction)   . Duodenal ulcer with hemorrhage 04/30/2011  . DM (diabetes mellitus) type II, controlled, with peripheral vascular disorder ([place]) 02/13/2010  . Hyperlipidemia LDL goal <70 08/16/2008  . ERECTILE DYSFUNCTION 08/16/2008  . Major depressive disorder, recurrent, in remission ([place]) 08/16/2008  . Essential hypertension 08/16/2008  . Coronary artery disease involving native coronary artery without angina pectoris 08/16/2008    Past Medical History:  Diagnosis Date  . Aortic transection 2005   Traumatic after a fall from a second floor. s/p repair at [facility]  . Basal cell carcinoma   . CAD (coronary artery disease) 1992   a. 1992 Acute anterior MI, thrombolytic therapy-->cath reportedly w/o significant CAD; b. 08/2016 MV:  EF 57%, hypertensive respons, no ischemia/infarct; c. 04/2017 CTA chest w/ coronary Ca2+.  . Depression   . Diastolic dysfunction    a. 04/2017 Echo: EF 60-65%, Gr1 DD, nl RV fxn.  . Erectile dysfunction   . Fall 2005   fell off house: torn aorta, clavicle fracture, rib fracture,  vertebral fractures, lung contusion, coma x 2 weeks  . Fracture of femoral neck, left ([place]) 08/09/2014   ORIF, Dr. Marry Guan  . Fracture of radial neck, left, closed 08/09/2014  . Gallstone pancreatitis   . Gastric ulcer   . History of ATN 08/09/2014   [facility], 2 days of dialysis (ARF)  . Hyperlipidemia    Statin with joint pain   . Hypertension   . Obesity   . Peripheral vascular disease ([place])    Atherosclerotic:R renal artery stenosis  . Type II diabetes mellitus ([place])   . Upper GI bleed    a. 04/2017 admit w/ melena and HGB down to 7 req prbc's; b. 04/2017 EGD: small HH, non-bleeding gastric & duod ulcers, non-bleeding erosive gastropathy-->PPI Rx.    Past Surgical History:  Procedure Laterality Date  . CARDIAC CATHETERIZATION  08/1991   50 % mid-Lad stenosis with clot, 25-505 second marginal  . CARDIOVASCULAR SURGERY     with ruptured Aorta, Dr. Camila Li, [facility]   . CHOLECYSTECTOMY    . ESOPHAGOGASTRODUODENOSCOPY (EGD) WITH PROPOFOL N/A 05/03/2017   Procedure: ESOPHAGOGASTRODUODENOSCOPY (EGD) WITH PROPOFOL;  Surgeon: Lucilla Lame, MD;  Location: [facility] ENDOSCOPY;  Service: Endoscopy;  Laterality: N/A;  . KNEE SURGERY     Right   . ORIF HIP FRACTURE Left 08/12/2014   Dr. Marry Guan  . THORACOTOMY     thoracic aorta repair   . TRACHEOSTOMY     s/p reversal    Social History   Social History  . Marital status: Married    Spouse name: N/A  . Number of children: N/A  . Years of education: N/A   Occupational History  . 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 Smoking status: Former Smoker    Packs/day: 1.50    Years: 20.00    Types: Cigarettes    Quit date: 09/13/1991  . Smokeless tobacco: Never Used     Comment: quit post MI  . Alcohol use No  . Drug use: No  . Sexual activity: Not on file   Other Topics Concern  . Not on file   Social History Narrative   No regular exercise     Family History  Problem Relation Age of Onset  . Dementia Mother  61  . Heart attack Father 37  . Diabetes Brother 49  . Heart attack Brother   . Diabetes Brother   . Colon cancer Neg Hx   . Stomach cancer Neg Hx   . Esophageal cancer Neg Hx   . Rectal cancer Neg Hx     Allergies  Allergen Reactions  . Contrast Media [Iodinated Diagnostic Agents]     Severe hives  . Xarelto [Rivaroxaban] Other (See Comments)    Reaction: GI Bleed  . Clindamycin/Lincomycin Rash  . Dye Fdc Red [Red Dye] Rash  . Lovenox [Enoxaparin Sodium]     Rash, blackness at injection site. Multifactorial and multiple other drugs used at the same time.  [name] Kitchen Penicillins Rash and Other (See Comments)    Has patient had a PCN reaction causing immediate rash, facial/tongue/throat swelling, SOB or lightheadedness with hypotension: Unknown Has patient had a PCN  reaction causing severe rash involving mucus membranes or skin necrosis: Unknown Has patient had a PCN reaction that required hospitalization: Unknown Has patient had a PCN reaction occurring within the last 10 years: No If all of the above answers are "NO", then may proceed with Cephalosporin use.     Medication list reviewed and updated in full in [place].   GEN: No acute illnesses, no fevers, chills. GI: No n/v/d, eating normally Pulm: No SOB Interactive and getting along well at home.  Otherwise, ROS is as per the HPI.  Objective:   BP 140/70   Pulse 79   Temp 97.9 F (36.6 C) (Oral)   Ht 5\' 8"  (1.727 m)   Wt 214 lb 8 oz (97.3 kg)   BMI 32.61 kg/m   GEN: WDWN, NAD, Non-toxic, A & O x 3 HEENT: Atraumatic, Normocephalic. Neck supple. No masses, No LAD. Ears and Nose: No external deformity. CV: RRR, No M/G/R. No JVD. No thrill. No extra heart sounds. PULM: CTA B, no wheezes, crackles, rhonchi. No retractions. No resp. distress. No accessory muscle use. EXTR: No c/c/e NEURO Normal gait.  PSYCH: Normally interactive. Conversant. Not depressed or anxious appearing.  Calm demeanor.   Laboratory and  Imaging Data: Dg Chest 2 View  Result Date: 05/07/2017 CLINICAL DATA:  Recent hemoptysis EXAM: CHEST  2 VIEW COMPARISON:  Chest radiograph May 04, 2017; chest CT February 19, 2017 FINDINGS: There is underlying bullous emphysematous change. There is no edema or consolidation. There is slight scarring in the left mid lung. Heart size and pulmonary vascularity are normal. No adenopathy. There is multifocal degenerative change in the thoracic spine. IMPRESSION: Underlying emphysematous change. Mild scarring left mid lung. No edema or consolidation. Stable cardiac silhouette. Electronically Signed   By: Lowella Grip III M.D.   On: 05/07/2017 12:08   Dg Chest 2 View  Result Date: 05/04/2017 CLINICAL DATA:  Abdominal pain . EXAM: CHEST  2 VIEW COMPARISON:  No prior . FINDINGS: Mediastinum hilar structures normal. Cardiomegaly with normal pulmonary vascularity. Low lung volumes with mild basilar atelectasis. No pleural effusion or pneumothorax. Mild basal pleural-parenchymal thickening consistent with scarring. IMPRESSION: Low lung volumes with mild basilar atelectasis. Mild basal pleural-parenchymal thickening consistent with scarring. No acute pulmonary disease. Electronically Signed   By: Marcello Moores  Register   On: 05/04/2017 12:17   Ct Angio Chest Pe W Or Wo Contrast  Result Date: 05/14/2017 CLINICAL DATA:  Poor oxygen saturation. Worsening shortness of breath. EXAM: CT ANGIOGRAPHY CHEST WITH CONTRAST TECHNIQUE: Multidetector CT imaging of the chest was performed using the standard protocol during bolus administration of intravenous contrast. Multiplanar CT image reconstructions and MIPs were obtained to evaluate the vascular anatomy. CONTRAST:  75 cc Isovue 370 COMPARISON:  Chest radiography 05/07/2017.  CT 02/19/2017. FINDINGS: Cardiovascular: Pulmonary arterial opacification is excellent. No pulmonary emboli. Heart size is normal. No pericardial fluid. Extensive coronary artery calcification. Aortic  atherosclerosis without aneurysm or dissection. Mediastinum/Nodes: No mass or lymphadenopathy. Lungs/Pleura: Advanced emphysema, upper lobe predominant. No sign of infiltrate, mass, effusion or collapse. Upper Abdomen: No acute or significant finding. Previous cholecystectomy. Musculoskeletal: Chronic thoracic degenerative changes. Review of the MIP images confirms the above findings. IMPRESSION: No pulmonary emboli or acute chest pathology by CT. Advanced emphysema. Advanced coronary artery calcification. Aortic Atherosclerosis (ICD10-I70.0) and Emphysema (ICD10-J43.9). Electronically Signed   By: Nelson Chimes M.D.   On: 05/14/2017 11:57   Ct Abdomen Pelvis W Contrast  Result Date: 05/02/2017 CLINICAL DATA:  Periumbilical pain,  nausea and vomiting x2 days. EXAM: CT ABDOMEN AND PELVIS WITH CONTRAST TECHNIQUE: Multidetector CT imaging of the abdomen and pelvis was performed using the standard protocol following bolus administration of intravenous contrast. CONTRAST:  197mL ISOVUE-300 IOPAMIDOL (ISOVUE-300) INJECTION 61% COMPARISON:  100 cc Isovue-300 IV FINDINGS: Lower chest: Tiny 2 mm left lower lobe pulmonary nodule. No pneumonic consolidation, effusion or pneumothorax. Heart is normal in size with three-vessel coronary arteriosclerosis. No pericardial effusion. Aortic atherosclerosis without aneurysm or dissection. Hepatobiliary: Status post cholecystectomy. Homogeneous enhancement of the liver. No biliary dilatation. Pancreas: Unremarkable. No pancreatic ductal dilatation or surrounding inflammatory changes. Spleen: Normal in size without focal abnormality. Adrenals/Urinary Tract: Normal bilateral adrenal glands. 4-5 mm circumscribed hypodensities within both kidneys consistent with cysts but are too small to further characterize. Punctate left lower pole renal calculus. No obstructive uropathy. Unremarkable bladder. Stomach/Bowel: The stomach is distended with oral contrast. There is a small hiatal hernia.  There is normal small bowel rotation without obstruction or inflammation. Appendix is normal. A moderate amount of fecal retention is seen within large bowel. No acute large bowel inflammation. There is scattered left-sided colonic diverticulosis. Vascular/Lymphatic: Dense aorto iliac atherosclerosis without aneurysm. No occlusion of the branch vessels. No adenopathy. Reproductive: Normal size prostate and seminal vesicles. Other: No abdominal wall hernia or abnormality. No abdominopelvic ascites. Musculoskeletal: Small fat containing right-sided inguinal hernia as before. Surgical clips in the left inguinal region. Chronic L2 compression with approximately 60% height loss unchanged in appearance. Mild superior endplate compression of T12 is likewise stable. Multilevel degenerative disc disease along the thoracolumbar spine. IMPRESSION: 1. Colonic diverticulosis without acute diverticulitis. 2. Normal-appearing appendix. No bowel obstruction. Moderate colonic stool burden. 3. Aortoiliac atherosclerosis without aneurysm. Three-vessel coronary arteriosclerosis. 4. Nonobstructing punctate left lower pole renal calculus with subcentimeter cysts within both kidneys, too small to further characterize. 5. Cholecystectomy without complications. Electronically Signed   By: Ashley Royalty M.D.   On: 05/02/2017 21:40    Inpatient labs also reviewed.   Assessment and Plan:   Gastrointestinal hemorrhage associated with gastric ulcer  Other iron deficiency anemia - Plan: CBC with Differential/Platelet  Encounter for long-term (current) use of medications - Plan: Basic metabolic panel  Acute kidney injury ([place])  Dehydration  Centrilobular emphysema ([place])  He is improving from a GI standpoint, he is not having any active bleeding in his hemoglobin is stable.  Continue PPI.  Acute renal failure, resolved at this point on yesterday's lab work.  Appears normotensive in the office today and in no distress.   Well-hydrated at this point.  Follow-up regarding his emphysema with Dr. Lake Bells tomorrow.  We appreciate his help.  Future Appointments Date Time Provider [place]  05/26/2017 10:00 AM [id number]  05/27/2017 9:00 AM [facility] TECH [facility] None  06/15/2017 2:30 PM Lucilla Lame, MD [facility] None  07/02/2017 11:45 AM Juanito Doom, MD [facility] None  08/19/2017 11:00 AM Rockey Situ, Kathlene November, MD [facility]    Meds ordered this encounter  Medications  . DISCONTD: citalopram (CELEXA) 20 MG tablet    Sig: Take 10 mg by mouth daily.  . ferrous sulfate 325 (65 FE) MG EC tablet    Sig: Take 325 mg by mouth 2 (two) times daily.  . citalopram (CELEXA) 10 MG tablet    Sig: Take 1 tablet (10 mg total) by mouth daily.    Dispense:  90 tablet    Refill:  1   Medications Discontinued During This Encounter  Medication Reason  .  citalopram (CELEXA) 20 MG tablet Change in therapy  . ferrous sulfate 325 (65 FE) MG EC tablet Change in therapy  . citalopram (CELEXA) 20 MG tablet Change in therapy   Orders Placed This Encounter  Procedures  . CBC with Differential/Platelet  . Basic metabolic panel    Signed,  Frederico Hamman T. Andriana Casa, MD   Allergies as of 05/20/2017      Reactions   Contrast Media [iodinated Diagnostic Agents]    Severe hives   Xarelto [rivaroxaban] Other (See Comments)   Reaction: GI Bleed   Clindamycin/lincomycin Rash   Dye Fdc Red [red Dye] Rash   Lovenox [enoxaparin Sodium]    Rash, blackness at injection site. Multifactorial and multiple other drugs used at the same time.   Penicillins Rash, Other (See Comments)   Has patient had a PCN reaction causing immediate rash, facial/tongue/throat swelling, SOB or lightheadedness with hypotension: Unknown Has patient had a PCN reaction causing severe rash involving mucus membranes or skin necrosis: Unknown Has patient had a PCN reaction that required hospitalization: Unknown Has patient had a  PCN reaction occurring within the last 10 years: No If all of the above answers are "NO", then may proceed with Cephalosporin use.      Medication List       Accurate as of 05/20/17 11:59 PM. Always use your most recent med list.          aspirin 81 MG chewable tablet Chew 81 mg by mouth daily.   atorvastatin 40 MG tablet Commonly known as:  LIPITOR Take 1 tablet (40 mg total) by mouth daily.   citalopram 10 MG tablet Commonly known as:  CELEXA Take 1 tablet (10 mg total) by mouth daily.   ferrous sulfate 325 (65 FE) MG EC tablet Take 325 mg by mouth 2 (two) times daily.   metFORMIN 500 MG 24 hr tablet Commonly known as:  GLUCOPHAGE-XR TAKE ONE TABLET BY MOUTH EVERY DAY   nitroGLYCERIN 0.4 MG SL tablet Commonly known as:  NITROSTAT Place 1 tablet (0.4 mg total) under the tongue every 5 (five) minutes as needed for chest pain.   pantoprazole 40 MG tablet Commonly known as:  PROTONIX Take 1 tablet (40 mg total) by mouth daily.

## 2017-05-21 ENCOUNTER — Encounter: Payer: Self-pay | Admitting: Pulmonary Disease

## 2017-05-21 ENCOUNTER — Ambulatory Visit (INDEPENDENT_AMBULATORY_CARE_PROVIDER_SITE_OTHER): Payer: PPO | Admitting: Pulmonary Disease

## 2017-05-21 VITALS — BP 134/78 | HR 69 | Ht 68.0 in | Wt 213.0 lb

## 2017-05-21 DIAGNOSIS — J432 Centrilobular emphysema: Secondary | ICD-10-CM

## 2017-05-21 DIAGNOSIS — R042 Hemoptysis: Secondary | ICD-10-CM | POA: Diagnosis not present

## 2017-05-21 DIAGNOSIS — R0600 Dyspnea, unspecified: Secondary | ICD-10-CM | POA: Diagnosis not present

## 2017-05-21 MED ORDER — GLYCOPYRROLATE-FORMOTEROL 9-4.8 MCG/ACT IN AERO: 2.0000 | INHALATION_SPRAY | Freq: Two times a day (BID) | RESPIRATORY_TRACT | 0 refills | Status: DC

## 2017-05-21 NOTE — Progress Notes (Signed)
Subjective:    Patient ID: [name], male    DOB: 05/15/1946, 71 y.o.   MRN: [id number]  Synopsis: First referred in 2015 for dyspnea, found to have mild airflow obstruction but moderate to persistent upper lobe predominant centrilobular emphysema. He smoked for 20 years, quit in 1992 after smoking 2 ppd.  His son worked at the pulmonary rehab facility at [facility] for several years.   HPI Chief Complaint  Patient presents with  . Follow-up    pt c/o increased sob with any exertion.  states this has been present Xfew weeks.  Anoro is not benefiting s/s at all per pt.     [name] was hospitalized for a GI bleed recently but then not long after that he developed hemoptysis. He had 2 CT scans of the chest performed which showed no evidence of a pulmonary embolism but showed centrilobular emphysema in an upper lobe predominant distribution. His hemoptysis tapered off after treatment with prednisone and antibiotics. He has not had any in several days since. He takes aspirin daily but no other blood thinners. He continues to have significant shortness of breath when he walks around. He is feels that he cannot take a deep breath. He says that this is severe. He took Anoro but this did not make any difference. He denies chest pain, wheezing, cough or leg swelling.   Past Medical History:  Diagnosis Date  . Aortic transection 2005   Traumatic after a fall from a second floor. s/p repair at [facility]  . Basal cell carcinoma   . CAD (coronary artery disease) 1992   a. 1992 Acute anterior MI, thrombolytic therapy-->cath reportedly w/o significant CAD; b. 08/2016 MV: EF 57%, hypertensive respons, no ischemia/infarct; c. 04/2017 CTA chest w/ coronary Ca2+.  . Depression   . Diastolic dysfunction    a. 04/2017 Echo: EF 60-65%, Gr1 DD, nl RV fxn.  . Erectile dysfunction   . Fall 2005   fell off house: torn aorta, clavicle fracture, rib fracture, vertebral fractures, lung contusion, coma x 2 weeks  . Fracture  of femoral neck, left ([place]) 08/09/2014   ORIF, Dr. Marry Guan  . Fracture of radial neck, left, closed 08/09/2014  . Gallstone pancreatitis   . Gastric ulcer   . History of ATN 08/09/2014   [facility], 2 days of dialysis (AR[facility])  . Hyperlipidemia    Statin with joint pain   . Hypertension   . Obesity   . Peripheral vascular disease ([place])    Atherosclerotic:R renal artery stenosis  . Type II diabetes mellitus ([place])   . Upper GI bleed    a. 04/2017 admit w/ melena and HGB down to 7 req prbc's; b. 04/2017 EGD: small HH, non-bleeding gastric & duod ulcers, non-bleeding erosive gastropathy-->PPI Rx.      Review of Systems  Constitutional: Negative for chills, fatigue and fever.  HENT: Negative for postnasal drip, rhinorrhea and sinus pain.   Respiratory: Positive for shortness of breath. Negative for chest tightness and wheezing.   Cardiovascular: Negative for chest pain, palpitations and leg swelling.       Objective:   Physical Exam Vitals:   05/21/17 0842  BP: 134/78  Pulse: 69  SpO2: 98%  Weight: 213 lb (96.6 kg)  Height: 5\' 8"  (1.727 m)   Gen: chronically ill appearing HENT: OP clear, TM's clear, neck supple PULM: Poor air movement B, normal percussion CV: RRR, no mgr, trace edema GI: BS+, soft, nontender Derm: scaling rash, peeling skin Psyche: normal mood  and affect   Cardiology records from June 2018 reviewed were he was seen for coronary disease hypertension and hyperlipidemia.  CBC    Component Value Date/Time   WBC 7.1 05/20/2017 1158   RBC 3.80 (L) 05/20/2017 1158   HGB 11.6 (L) 05/20/2017 1158   HGB 10.6 (L) 08/23/2014 0619   HCT 33.8 (L) 05/20/2017 1158   HCT 31.5 (L) 08/23/2014 0619   PLT 222.0 Repeated and verified X2. 05/20/2017 1158   PLT 328 08/23/2014 0619   MCV 89.0 05/20/2017 1158   MCV 92 08/23/2014 0619   MCH 30.5 05/16/2017 0542   MCHC 34.2 05/20/2017 1158   RDW 14.5 05/20/2017 1158   RDW 14.3 08/23/2014 0619   LYMPHSABS 0.9 05/20/2017 1158    LYMPHSABS 1.0 08/23/2014 0619   MONOABS 0.5 05/20/2017 1158   MONOABS 0.5 08/23/2014 0619   EOSABS 0.2 05/20/2017 1158   EOSABS 0.3 08/23/2014 0619   BASOSABS 0.1 05/20/2017 1158   BASOSABS 0.0 08/23/2014 1517   Imaging: 2017 nuclear stress test unremarkable 01/2017 HRCT> moderate to severe centrilobular emphysema, no ILD, three vessel CAD noted, mild calcification aortic valve Images independently reviewed 03/08/2017 04/2017 CT angio chest > no PE, upper lobe predominant emphysema  Echocardiogram  October 2017 normal LVEF and normal estimated PA pressure  PFT: 09/2016 Ratio 64%, FEV1 2.96 L 106% predicted, FVC 4.61 L 113% predicted, total lung capacity 7.55 125% predicted, residual volume 124% predicted, DLCO 24.3 mL/m/mm of mercury 79% predicted      Assessment & Plan:  Dyspnea, unspecified type - Plan: DG Sniff Test  Centrilobular emphysema (HCC)  Hemoptysis   Discussion: Done he has struggled with dyspnea despite [place] attempting to treat his centrilobular emphysema with bronchodilator therapy. He has upper lobe predominant centrilobular emphysema and only mild airflow obstruction but severe dyspnea. In this situation I wonder if lung volume reduction surgery would be helpful. I would like to try another type of dual bronchodilator therapy and encourage him to exercise more. His son notes that the dyspnea started 13 years ago after a fall from a house with blunt force trauma. He does have slightly elevated right hemidiaphragms we'll check a diaphragm fluoroscopy test to assess this better.  He hemoptysis recently which was likely due to bronchitis. However given his smoking history he needs a bronchoscopy for further evaluation.  Plan: For your hemoptysis: We will arrange for a bronchoscopy at [facility] next Thursday at 9 AM.  For your shortness of breath: We will check a diaphragm fluoroscopy ("sniff test")  For your centrilobular emphysema: Try taking Bivespi I  will reach out to the team at [facility] to see if they offer lung volume reduction bronchoscopy  We will see you back in 4-6 weeks or sooner if needed  Current Outpatient Prescriptions:  .  aspirin 81 MG chewable tablet, Chew 81 mg by mouth daily., Disp: , Rfl:  .  atorvastatin (LIPITOR) 40 MG tablet, Take 1 tablet (40 mg total) by mouth daily., Disp: 90 tablet, Rfl: 2 .  citalopram (CELEXA) 10 MG tablet, Take 1 tablet (10 mg total) by mouth daily., Disp: 90 tablet, Rfl: 1 .  ferrous sulfate 325 (65 FE) MG EC tablet, Take 325 mg by mouth 2 (two) times daily., Disp: , Rfl:  .  metFORMIN (GLUCOPHAGE-XR) 500 MG 24 hr tablet, TAKE ONE TABLET BY MOUTH EVERY DAY, Disp: 90 tablet, Rfl: 1 .  nitroGLYCERIN (NITROSTAT) 0.4 MG SL tablet, Place 1 tablet (0.4 mg total) under the tongue  every 5 (five) minutes as needed for chest pain., Disp: 25 tablet, Rfl: 6 .  pantoprazole (PROTONIX) 40 MG tablet, Take 1 tablet (40 mg total) by mouth daily., Disp: 60 tablet, Rfl: 0

## 2017-05-21 NOTE — Patient Instructions (Signed)
For your hemoptysis: We will arrange for a bronchoscopy at [facility] next Thursday at 9 AM.  For your shortness of breath: We will check a diaphragm fluoroscopy ("sniff test")  For your centrilobular emphysema: Try taking Bivespi I will reach out to the team at [facility] to see if they offer lung volume reduction bronchoscopy  We will see you back in 4-6 weeks or sooner if needed

## 2017-05-24 ENCOUNTER — Telehealth: Payer: Self-pay

## 2017-05-24 NOTE — Telephone Encounter (Signed)
Not unreasonable. Can we just call them to pick up?

## 2017-05-24 NOTE — Telephone Encounter (Signed)
Pt left v/m; when pt was in hospital [facility] brought oxygen to the home. Now pt wants to have [facility] pick up the oxygen. Pt request Dr Lorelei Pont to order pick up of oxygen. Pt had hospital f/u on 05/20/17.Please advise.

## 2017-05-25 NOTE — Discharge Summary (Signed)
[name] NAME: [name]    MR#:  [id number]  DATE OF BIRTH:  [birth year]  DATE OF ADMISSION:  05/14/2017 ADMITTING PHYSICIAN: Hillary Bow, MD  DATE OF DISCHARGE: 05/16/2017  1:30 PM  PRIMARY CARE PHYSICIAN: [name], MD   ADMISSION DIAGNOSIS:  Elevated troponin I level [R74.8] Near syncope [R55]  DISCHARGE DIAGNOSIS:  Active Problems:   Coronary artery disease involving native coronary artery without angina pectoris   COPD (chronic obstructive pulmonary disease) ([place])   SECONDARY DIAGNOSIS:   Past Medical History:  Diagnosis Date  . Aortic transection 2005   Traumatic after a fall from a second floor. s/p repair [facility]  . Basal cell carcinoma   . CAD (coronary artery disease) 1992   a. 1992 Acute anterior MI, thrombolytic therapy-->cath reportedly w/o significant CAD; b. 08/2016 MV: EF 57%, hypertensive respons, no ischemia/infarct; c. 04/2017 CTA chest w/ coronary Ca2+.  . Depression   . Diastolic dysfunction    a. 04/2017 Echo: EF 60-65%, Gr1 DD, nl RV fxn.  . Erectile dysfunction   . Fall 2005   fell off house: torn aorta, clavicle fracture, rib fracture, vertebral fractures, lung contusion, coma x 2 weeks  . Fracture of femoral neck, left ([place]) 08/09/2014   ORIF, Dr. Marry Guan  . Fracture of radial neck, left, closed 08/09/2014  . Gallstone pancreatitis   . Gastric ulcer   . History of ATN 08/09/2014   [facility], 2 days of dialysis (ARF)  . Hyperlipidemia    Statin with joint pain   . Hypertension   . Obesity   . Peripheral vascular disease ([place])    Atherosclerotic:R renal artery stenosis  . Type II diabetes mellitus ([place])   . Upper GI bleed    a. 04/2017 admit w/ melena and HGB down to 7 req prbc's; b. 04/2017 EGD: small HH, non-bleeding gastric & duod ulcers, non-bleeding erosive gastropathy-->PPI Rx.     ADMITTING HISTORY  HISTORY OF PRESENT ILLNESS:  [name]  is a 71 y.o. male with a known history of  COPD on 3 L oxygen ,Hypertension, diabetes, CAD, recent GI bleed with nonbleeding ulcers on EGD presents to the emergency room complaining of dizziness and orthostatic hypotension at home. Patient was recently in the hospital for GI bleed and COPD exacerbation and was started on oxygen and discharged home. His hemoglobin at discharge was 9.5. Due to hyperkalemia and his lisinopril was stopped and started on 12.5 mg hydrochlorothiazide. He has noticed dizziness on standing up and blood pressure drops from systolic 1 30 to 70 on standing up. He had some hemoptysis at discharge which has resolved. He is waiting for follow-up appointment at pulmonary Dr. Anastasia Pall office. Today patient has been found to have elevated troponin of 0.20. A CT scan of the chest shows no pulmonary embolism. Shows advanced emphysema. He has no chest pain. Does complain of some pain in his shoulders on exertion which is chronic. Some dark stools but no melena.   HOSPITAL COURSE:   * Severe leukocytosis. Etiology is unclear. No bandemia. No fever. No source of infection. Likely due to stress reaction from allergy reaction to the IV dye.  Labs repeated and leukocytosis trended down close to normal.  * Elevated troponin. Likely due to significant hypotension in setting of CAD. Troponin has trended down. No chest pain. Appreciated cardiology input.discussed with Dr. Rockey Situ. Echocardiogram has been repeated no change in wall motion. Will likely need ischemic cardiac workup  as outpatient.  * Orthostatic hypotension Likely due to hydrochlorothiazide and decreased oral intake. Resolved by the day of discharge Treated with IV fluids.  He did have some worsening of his orthostatic hypotension with an allergy reaction to contrast dye.  Some associated rash.  Resolved.  * Recent GI bleed due to gastric ulcers. No further black stools. Hemoglobin stable. Will need outpatient GI follow-up.  * Hypertension.  Hydrochlorothiazide  discontinued due to dehydration, hypokalemia.  Continue Norvasc.  * Diabetes mellitus. Continue metformin.  Patient stable for discharge.  He will need follow-up with his pulmonologist Dr. Lake Bells for his advanced emphysema.  Follow-up with Dr. Rockey Situ for Ischemic workup.  CONSULTS OBTAINED:  Treatment Team:  Minna Merritts, MD  DRUG ALLERGIES:   Allergies  Allergen Reactions  . Contrast Media [Iodinated Diagnostic Agents]     Severe hives  . Xarelto [Rivaroxaban] Other (See Comments)    Reaction: GI Bleed  . Clindamycin/Lincomycin Rash  . Dye Fdc Red [Red Dye] Rash  . Lovenox [Enoxaparin Sodium]     Rash, blackness at injection site. Multifactorial and multiple other drugs used at the same time.  [name] Kitchen Penicillins Rash and Other (See Comments)    Has patient had a PCN reaction causing immediate rash, facial/tongue/throat swelling, SOB or lightheadedness with hypotension: Unknown Has patient had a PCN reaction causing severe rash involving mucus membranes or skin necrosis: Unknown Has patient had a PCN reaction that required hospitalization: Unknown Has patient had a PCN reaction occurring within the last 10 years: No If all of the above answers are "NO", then may proceed with Cephalosporin use.     DISCHARGE MEDICATIONS:   Discharge Medication List as of 05/16/2017 12:58 PM    CONTINUE these medications which have NOT CHANGED   Details  atorvastatin (LIPITOR) 40 MG tablet Take 1 tablet (40 mg total) by mouth daily., Starting Sat 04/24/2017, Normal    metFORMIN (GLUCOPHAGE-XR) 500 MG 24 hr tablet TAKE ONE TABLET BY MOUTH EVERY DAY, Normal    pantoprazole (PROTONIX) 40 MG tablet Take 1 tablet (40 mg total) by mouth daily., Starting Fri 05/07/2017, Normal    amLODipine (NORVASC) 5 MG tablet Take 5 mg by mouth daily., Historical Med    citalopram (CELEXA) 20 MG tablet TAKE ONE TABLET EVERY DAY, Normal    ferrous sulfate 325 (65 FE) MG EC tablet Take 1 tablet (325 mg total) by  mouth daily with breakfast., Starting Fri 05/07/2017, Until Sat 05/07/2018, Normal    Tiotropium Bromide-Olodaterol (STIOLTO RESPIMAT) 2.5-2.5 MCG/ACT AERS Inhale 2 puffs into the lungs daily., Starting Mon 03/22/2017, Normal      STOP taking these medications     hydrochlorothiazide (HYDRODIURIL) 12.5 MG tablet      predniSONE (DELTASONE) 20 MG tablet         Today   VITAL SIGNS:  Blood pressure (!) 116/55, pulse 93, temperature 98 F (36.7 C), temperature source Oral, resp. rate 18, height 5\' 8"  (1.727 m), weight 95.8 kg (211 lb 1.6 oz), SpO2 98 %.  I/O:  No intake or output data in the 24 hours ending 05/25/17 1605  PHYSICAL EXAMINATION:  Physical Exam  GENERAL:  71 y.o.-year-old patient lying in the bed with no acute distress.  LUNGS: Normal breath sounds bilaterally, no wheezing, rales,rhonchi or crepitation. No use of accessory muscles of respiration.  CARDIOVASCULAR: S1, S2 normal. No murmurs, rubs, or gallops.  ABDOMEN: Soft, non-tender, non-distended. Bowel sounds present. No organomegaly or mass.  NEUROLOGIC: Moves all 4 extremities.  PSYCHIATRIC: The patient is alert and oriented x 3.  SKIN: No obvious rash, lesion, or ulcer.   DATA REVIEW:   CBC  Recent Labs Lab 05/20/17 1158  WBC 7.1  HGB 11.6*  HCT 33.8*  PLT 222.0 Repeated and verified X2.    Chemistries   Recent Labs Lab 05/20/17 1158  NA 138  K 4.8  CL 107  CO2 25  GLUCOSE 163*  BUN 15  CREATININE 0.92  CALCIUM 9.4    Cardiac Enzymes No results for input(s): TROPONINI in the last 168 hours.  Microbiology Results  Results for orders placed or performed during the hospital encounter of 05/02/17  MRSA PCR Screening     Status: None   Collection Time: 05/04/17  3:37 PM  Result Value Ref Range Status   MRSA by PCR NEGATIVE NEGATIVE Final    Comment:        The GeneXpert MRSA Assay (FDA approved for NASAL specimens only), is one component of a comprehensive MRSA  colonization surveillance program. It is not intended to diagnose MRSA infection nor to guide or monitor treatment for MRSA infections.   Culture, blood (Routine X 2) w Reflex to ID Panel     Status: None   Collection Time: 05/04/17 10:01 PM  Result Value Ref Range Status   Specimen Description BLOOD R HAND  Final   Special Requests BOTTLES DRAWN AEROBIC AND ANAEROBIC BCLV  Final   Culture NO GROWTH 5 DAYS  Final   Report Status 05/09/2017 FINAL  Final  Culture, blood (Routine X 2) w Reflex to ID Panel     Status: None   Collection Time: 05/04/17 11:59 PM  Result Value Ref Range Status   Specimen Description BLOOD BLOOD RIGHT HAND ONLY ONE BOTTLE  Final   Special Requests   Final    BOTTLES DRAWN AEROBIC AND ANAEROBIC Blood Culture results may not be optimal due to an inadequate volume of blood received in culture bottles   Culture NO GROWTH 5 DAYS  Final   Report Status 05/10/2017 FINAL  Final  Culture, expectorated sputum-assessment     Status: None   Collection Time: 05/05/17  2:13 AM  Result Value Ref Range Status   Specimen Description EXPECTORATED SPUTUM  Final   Special Requests NONE  Final   Sputum evaluation   Final    Sputum specimen not acceptable for testing.  Please recollect.   BARBARA THAO @ 0406 ON 05/05/2017 BY CAF    Report Status 05/06/2017 FINAL  Final    RADIOLOGY:  No results found.  Follow up with PCP in 1 week.  Management plans discussed with the patient, family and they are in agreement.  CODE STATUS:  Code Status History    Date Active Date Inactive Code Status Order ID Comments User Context   05/14/2017 12:22 PM 05/16/2017  6:20 PM Full Code [id number]  Hillary Bow, MD ED   05/03/2017 12:36 AM 05/08/2017  4:37 PM Full Code [id number]  Lance Coon, MD Inpatient    Advance Directive Documentation     Most Recent Value  Type of Advance Directive  Healthcare Power of Attorney  Pre-existing out of facility DNR order (yellow form or pink MOST form)   -  "MOST" Form in Place?  -      TOTAL TIME TAKING CARE OF THIS PATIENT ON DAY OF DISCHARGE: more than 30 minutes.   Hillary Bow R M.D on 05/25/2017 at 4:05 PM  Between 7am to 6pm - Pager -  [phone]  After 6pm go to [email or web address] - password [email or web address] [facility] [place]  Office  [phone]  CC: Primary care physician; [name], MD  Note: This dictation was prepared with Dragon dictation along with smaller phrase technology. Any transcriptional errors that result from this process are unintentional.

## 2017-05-25 NOTE — Telephone Encounter (Signed)
Called [facility] at [phone] and was on hold for over 10 minutes. Will have to try and call back later.

## 2017-05-25 NOTE — Telephone Encounter (Signed)
Spoke to [place] at [facility] and was advised that the doctor has to write up an order to discontinue the oxygen, sign it and fax it to [phone] and then they will make arrangements to pick the oxygen up.

## 2017-05-26 ENCOUNTER — Ambulatory Visit
Admission: RE | Admit: 2017-05-26 | Discharge: 2017-05-26 | Disposition: A | Discharge status: Home / Self Care | Payer: PPO | Source: Ambulatory Visit | Attending: Pulmonary Disease | Admitting: Pulmonary Disease

## 2017-05-26 DIAGNOSIS — R0600 Dyspnea, unspecified: Secondary | ICD-10-CM | POA: Insufficient documentation

## 2017-05-26 NOTE — Telephone Encounter (Signed)
Back in hospital - we should wait until see how he is at discharge.

## 2017-05-27 ENCOUNTER — Ambulatory Visit (HOSPITAL_COMMUNITY)
Admission: RE | Admit: 2017-05-27 | Discharge: 2017-05-27 | Disposition: A | Discharge status: Home / Self Care | Payer: PPO | Source: Ambulatory Visit | Attending: Pulmonary Disease | Admitting: Pulmonary Disease

## 2017-05-27 ENCOUNTER — Encounter (HOSPITAL_COMMUNITY)
Admission: RE | Disposition: A | Discharge status: Home / Self Care | Payer: Self-pay | Source: Ambulatory Visit | Attending: Pulmonary Disease

## 2017-05-27 ENCOUNTER — Encounter (HOSPITAL_COMMUNITY): Payer: Self-pay | Admitting: Respiratory Therapy

## 2017-05-27 DIAGNOSIS — Z888 Allergy status to other drugs, medicaments and biological substances status: Secondary | ICD-10-CM | POA: Insufficient documentation

## 2017-05-27 DIAGNOSIS — Z8719 Personal history of other diseases of the digestive system: Secondary | ICD-10-CM | POA: Insufficient documentation

## 2017-05-27 DIAGNOSIS — I5189 Other ill-defined heart diseases: Secondary | ICD-10-CM | POA: Diagnosis not present

## 2017-05-27 DIAGNOSIS — N529 Male erectile dysfunction, unspecified: Secondary | ICD-10-CM | POA: Insufficient documentation

## 2017-05-27 DIAGNOSIS — K921 Melena: Secondary | ICD-10-CM | POA: Diagnosis not present

## 2017-05-27 DIAGNOSIS — Z859 Personal history of malignant neoplasm, unspecified: Secondary | ICD-10-CM | POA: Diagnosis not present

## 2017-05-27 DIAGNOSIS — R042 Hemoptysis: Secondary | ICD-10-CM | POA: Diagnosis not present

## 2017-05-27 DIAGNOSIS — K449 Diaphragmatic hernia without obstruction or gangrene: Secondary | ICD-10-CM | POA: Diagnosis not present

## 2017-05-27 DIAGNOSIS — I2510 Atherosclerotic heart disease of native coronary artery without angina pectoris: Secondary | ICD-10-CM | POA: Diagnosis not present

## 2017-05-27 DIAGNOSIS — E119 Type 2 diabetes mellitus without complications: Secondary | ICD-10-CM | POA: Diagnosis not present

## 2017-05-27 DIAGNOSIS — Z87891 Personal history of nicotine dependence: Secondary | ICD-10-CM | POA: Insufficient documentation

## 2017-05-27 DIAGNOSIS — K259 Gastric ulcer, unspecified as acute or chronic, without hemorrhage or perforation: Secondary | ICD-10-CM | POA: Diagnosis not present

## 2017-05-27 DIAGNOSIS — K3189 Other diseases of stomach and duodenum: Secondary | ICD-10-CM | POA: Insufficient documentation

## 2017-05-27 DIAGNOSIS — I10 Essential (primary) hypertension: Secondary | ICD-10-CM | POA: Insufficient documentation

## 2017-05-27 DIAGNOSIS — Z91041 Radiographic dye allergy status: Secondary | ICD-10-CM | POA: Diagnosis not present

## 2017-05-27 DIAGNOSIS — E669 Obesity, unspecified: Secondary | ICD-10-CM | POA: Diagnosis not present

## 2017-05-27 DIAGNOSIS — K269 Duodenal ulcer, unspecified as acute or chronic, without hemorrhage or perforation: Secondary | ICD-10-CM | POA: Insufficient documentation

## 2017-05-27 DIAGNOSIS — I252 Old myocardial infarction: Secondary | ICD-10-CM | POA: Diagnosis not present

## 2017-05-27 DIAGNOSIS — I7389 Other specified peripheral vascular diseases: Secondary | ICD-10-CM | POA: Diagnosis not present

## 2017-05-27 DIAGNOSIS — F329 Major depressive disorder, single episode, unspecified: Secondary | ICD-10-CM | POA: Insufficient documentation

## 2017-05-27 DIAGNOSIS — Z880 Allergy status to penicillin: Secondary | ICD-10-CM | POA: Insufficient documentation

## 2017-05-27 HISTORY — PX: VIDEO BRONCHOSCOPY: SHX5072

## 2017-05-27 LAB — GLUCOSE, CAPILLARY: Glucose-Capillary: 121 mg/dL — ABNORMAL HIGH (ref 65–99)

## 2017-05-27 SURGERY — VIDEO BRONCHOSCOPY WITHOUT FLUORO
Anesthesia: Moderate Sedation | Laterality: Bilateral

## 2017-05-27 MED ORDER — FENTANYL CITRATE (PF) 100 MCG/2ML IJ SOLN
INTRAMUSCULAR | Status: DC | PRN
  Administered 2017-05-27: 25 ug via INTRAVENOUS
  Administered 2017-05-27: 50 ug via INTRAVENOUS

## 2017-05-27 MED ORDER — MIDAZOLAM HCL 10 MG/2ML IJ SOLN
INTRAMUSCULAR | Status: DC | PRN
  Administered 2017-05-27: 1 mg via INTRAVENOUS
  Administered 2017-05-27: 2 mg via INTRAVENOUS

## 2017-05-27 MED ORDER — LIDOCAINE HCL 2 % EX GEL: 1.0000 "application " | Freq: Once | CUTANEOUS | Status: DC

## 2017-05-27 MED ORDER — FENTANYL CITRATE (PF) 100 MCG/2ML IJ SOLN
INTRAMUSCULAR | Status: AC
  Filled 2017-05-27: qty 4

## 2017-05-27 MED ORDER — SODIUM CHLORIDE 0.9 % IV SOLN
INTRAVENOUS | Status: DC
  Administered 2017-05-27: 09:00:00 via INTRAVENOUS

## 2017-05-27 MED ORDER — BUTAMBEN-TETRACAINE-BENZOCAINE 2-2-14 % EX AERO: 1.0000 | INHALATION_SPRAY | Freq: Once | CUTANEOUS | Status: DC

## 2017-05-27 MED ORDER — PHENYLEPHRINE HCL 0.25 % NA SOLN
NASAL | Status: DC | PRN
  Administered 2017-05-27: 2 via NASAL

## 2017-05-27 MED ORDER — MIDAZOLAM HCL 5 MG/ML IJ SOLN
INTRAMUSCULAR | Status: AC
  Filled 2017-05-27: qty 2

## 2017-05-27 MED ORDER — PHENYLEPHRINE HCL 0.25 % NA SOLN: 1.0000 | Freq: Four times a day (QID) | NASAL | Status: DC | PRN

## 2017-05-27 MED ORDER — LIDOCAINE HCL 2 % EX GEL
CUTANEOUS | Status: DC | PRN
  Administered 2017-05-27: 1 "application "

## 2017-05-27 MED ORDER — LIDOCAINE HCL 1 % IJ SOLN
INTRAMUSCULAR | Status: DC | PRN
  Administered 2017-05-27: 6 mL via RESPIRATORY_TRACT

## 2017-05-27 NOTE — H&P (Signed)
[facility]   HPI: 71 y/o male with emphysema admitted to the hospital a few weeks ago with hemoptysis.  Here today for an airway exam  Past Medical History:  Diagnosis Date  . Aortic transection 2005   Traumatic after a fall from a second floor. s/p repair at [facility]  . Basal cell carcinoma   . CAD (coronary artery disease) 1992   a. 1992 Acute anterior MI, thrombolytic therapy-->cath reportedly w/o significant CAD; b. 08/2016 MV: EF 57%, hypertensive respons, no ischemia/infarct; c. 04/2017 CTA chest w/ coronary Ca2+.  . Depression   . Diastolic dysfunction    a. 04/2017 Echo: EF 60-65%, Gr1 DD, nl RV fxn.  . Erectile dysfunction   . Fall 2005   fell off house: torn aorta, clavicle fracture, rib fracture, vertebral fractures, lung contusion, coma x 2 weeks  . Fracture of femoral neck, left ([place]) 08/09/2014   ORIF, Dr. Marry Guan  . Fracture of radial neck, left, closed 08/09/2014  . Gallstone pancreatitis   . Gastric ulcer   . History of ATN 08/09/2014   [facility], 2 days of dialysis (ARF)  . Hyperlipidemia    Statin with joint pain   . Hypertension   . Obesity   . Peripheral vascular disease ([place])    Atherosclerotic:R renal artery stenosis  . Type II diabetes mellitus ([place])   . Upper GI bleed    a. 04/2017 admit w/ melena and HGB down to 7 req prbc's; b. 04/2017 EGD: small HH, non-bleeding gastric & duod ulcers, non-bleeding erosive gastropathy-->PPI Rx.     Family History  Problem Relation Age of Onset  . Dementia Mother 26  . Heart attack Father 66  . Diabetes Brother 27  . Heart attack Brother   . Diabetes Brother   . Colon cancer Neg Hx   . Stomach cancer Neg Hx   . Esophageal cancer Neg Hx   . Rectal cancer Neg Hx      Social History   Social History  . Marital status: Married    Spouse name: N/A  . Number of children: N/A  . Years of education: N/A   Occupational History  . 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 Smoking status: Former Smoker   Packs/day: 1.50    Years: 20.00    Types: Cigarettes    Quit date: 09/13/1991  . Smokeless tobacco: Never Used     Comment: quit post MI  . Alcohol use No  . Drug use: No  . Sexual activity: Not on file   Other Topics Concern  . Not on file   Social History Narrative   No regular exercise      Allergies  Allergen Reactions  . Contrast Media [Iodinated Diagnostic Agents]     Severe hives  . Xarelto [Rivaroxaban] Other (See Comments)    Reaction: GI Bleed  . Clindamycin/Lincomycin Rash  . Dye Fdc Red [Red Dye] Rash  . Lovenox [Enoxaparin Sodium]     Rash, blackness at injection site. Multifactorial and multiple other drugs used at the same time.  [name] Kitchen Penicillins Rash and Other (See Comments)    Has patient had a PCN reaction causing immediate rash, facial/tongue/throat swelling, SOB or lightheadedness with hypotension: Unknown Has patient had a PCN reaction causing severe rash involving mucus membranes or skin necrosis: Unknown Has patient had a PCN reaction that required hospitalization: Unknown Has patient had a PCN reaction occurring within the last 10 years: No If all of  the above answers are "NO", then may proceed with Cephalosporin use.      @encmedstart @ Vitals:   05/27/17 0750 05/27/17 0755 05/27/17 0800 05/27/17 0805  BP:  (!) 187/84 (!) 172/85   Resp: 12 15 14 17   Temp:      TempSrc:      SpO2: 95% 96% 98% 98%   Gen: well appearing HENT: OP clear, TM's clear, neck supple PULM: CTA B, normal percussion CV: RRR, no mgr, trace edema GI: BS+, soft, nontender Derm: no cyanosis or rash Psyche: normal mood and affect    CT chest images reviewed: upper lobe predominant emphysema, no masses noted   Impression/plan  Hemoptysis: plan bronchoscopy today  Roselie Awkward, MD [place] [facility] Pager: [phone] Cell: [phone] After 3pm or if no response, call [phone]

## 2017-05-27 NOTE — Discharge Instructions (Signed)
Flexible Bronchoscopy, Care After These instructions give you information on caring for yourself after your procedure. Your doctor may also give you more specific instructions. Call your doctor if you have any problems or questions after your procedure. Follow these instructions at home:  Do not eat or drink anything for 2 hours after your procedure. If you try to eat or drink before the medicine wears off, food or drink could go into your lungs. You could also burn yourself.  After 2 hours have passed and when you can cough and gag normally, you may eat soft food and drink liquids slowly.  The day after the test, you may eat your normal diet.  You may do your normal activities.  Keep all doctor visits. Get help right away if:  You get more and more short of breath.  You get light-headed.  You feel like you are going to pass out (faint).  You have chest pain.  You have new problems that worry you.  You cough up more than a little blood.  You cough up more blood than before. This information is not intended to replace advice given to you by your health care provider. Make sure you discuss any questions you have with your health care provider. Document Released: 09/13/2009 Document Revised: 04/23/2016 Document Reviewed: 07/21/2013 Elsevier Interactive Patient Education  2017 [place].  Nothing to eat or drink until  11:30    am today  05/27/2017

## 2017-05-27 NOTE — Progress Notes (Signed)
Video Bronchoscopy done  No interventions at this time Inspection of lungs done Procedure tolerated well

## 2017-05-27 NOTE — Op Note (Signed)
[facility] Cardiopulmonary Patient Name: [name] Procedure Date: 05/27/2017 MRN: [id number] Attending MD: Juanito Doom , MD Date of Birth: [birth year] CSN: [id number] Age: 71 Admit Type: Outpatient Ethnicity: Not Hispanic or Latino Procedure:            Bronchoscopy Indications:          Hemoptysis with normal CXR Providers:            Nathaneil Canary B. Lake Bells, MD, Andre Lefort RRT,RCP,                        Ashley Mariner RRT,RCP Referring MD:          Medicines:            Fentanyl 75 mcg IV, Midazolam 3 mg mg IV, Lidocaine 1%                        applied to cords 6 mL, Lidocaine 1% subglottic space 3                        mL Complications:        No immediate complications Estimated Blood Loss: Estimated blood loss: none. Procedure:      Pre-Anesthesia Assessment:      - A History and Physical has been performed. Patient meds and allergies       have been reviewed. The risks and benefits of the procedure and the       sedation options and risks were discussed with the patient. All       questions were answered and informed consent was obtained. Patient       identification and proposed procedure were verified prior to the       procedure by the physician and the technician in the procedure room.       Mental Status Examination: alert and oriented. Airway Examination:       normal oropharyngeal airway. Respiratory Examination: clear to       auscultation. CV Examination: RRR, no murmurs, no S3 or S4. ASA Grade       Assessment: II - A patient with mild systemic disease. After reviewing       the risks and benefits, the patient was deemed in satisfactory condition       to undergo the procedure. The anesthesia plan was to use moderate       sedation / analgesia (conscious sedation). Immediately prior to       administration of medications, the patient was re-assessed for adequacy       to receive sedatives. The heart rate, respiratory rate, oxygen   saturations, blood pressure, adequacy of pulmonary ventilation, and       response to care were monitored throughout the procedure. The physical       status of the patient was re-assessed after the procedure.      After obtaining informed consent, the bronchoscope was passed under       direct vision. Throughout the procedure, the patient's blood pressure,       pulse, and oxygen saturations were monitored continuously. the [id number]       ([id number]) scope was introduced through the mouth and advanced to the       tracheobronchial tree of both lungs. The procedure was accomplished       without difficulty. The patient tolerated the procedure well. The total  duration of the procedure was 5 minutes. Findings:      The oropharynx appears normal. The larynx appears normal. The vocal       cords appear normal. The subglottic space is normal. The trachea is of       normal caliber. The carina is sharp. The tracheobronchial tree was       examined to at least the first subsegmental level. Bronchial mucosa and       anatomy are normal; there are no endobronchial lesions, and no       secretions. Impression:      - Hemoptysis with normal CXR      - The examination was normal.      - No specimens collected. Moderate Sedation:      Moderate (conscious) sedation was personally administered by the       endoscopist. The following parameters were monitored: oxygen saturation,       heart rate, blood pressure, and response to care. Total physician       intraservice time was 10 minutes. Recommendation:      - Follow up with bronchoscopist as previously scheduled. Procedure Code(s):      --- Professional ---      [phone], Bronchoscopy, rigid or flexible, including fluoroscopic guidance,       when performed; diagnostic, with cell washing, when performed (separate       procedure)      99152, Moderate sedation services provided by the same physician or       other qualified health care  professional performing the diagnostic or       therapeutic service that the sedation supports, requiring the presence       of an independent trained observer to assist in the monitoring of the       patient's level of consciousness and physiological status; initial 15       minutes of intraservice time, patient age 3 years or older Diagnosis Code(s):      --- Professional ---      R04.2, Hemoptysis CPT copyright 2016 American Medical Association. All rights reserved. The codes documented in this report are preliminary and upon coder review may  be revised to meet current compliance requirements. Norlene Campbell, MD Juanito Doom, MD 05/27/2017 9:41:12 AM This report has been signed electronically. Number of Addenda: 0 Scope In: 9:31:28 AM Scope Out: 9:36:28 AM

## 2017-05-28 ENCOUNTER — Encounter (HOSPITAL_COMMUNITY): Payer: Self-pay | Admitting: Pulmonary Disease

## 2017-05-28 ENCOUNTER — Telehealth: Payer: Self-pay | Admitting: Pulmonary Disease

## 2017-05-28 DIAGNOSIS — J432 Centrilobular emphysema: Secondary | ICD-10-CM

## 2017-05-28 NOTE — Telephone Encounter (Signed)
OK by me 

## 2017-05-28 NOTE — Telephone Encounter (Signed)
Called and spoke with pt and he stated that [facility] needs an order from [place] to pick up his oxygen.  He stated that he tried to call them to see if they could pick it up, but they will not do this without an order. BQ please advise. Thanks

## 2017-05-28 NOTE — Telephone Encounter (Signed)
Order placed to d/c O2.  Pt aware.  Nothing further needed.

## 2017-06-09 ENCOUNTER — Telehealth: Payer: Self-pay | Admitting: Gastroenterology

## 2017-06-09 NOTE — Telephone Encounter (Signed)
*  STAT* If patient is at the pharmacy, call can be transferred to refill team.   1. Which medications need to be refilled? (please list name of each medication and dose if known) pantoprazole (PROTONIX) 40 MG tablet  2. Which pharmacy/location (including street and city if local pharmacy) is medication to be sent to? [place]   3. Do they need a 30 day or 90 day supply? 30 day

## 2017-06-15 ENCOUNTER — Ambulatory Visit (INDEPENDENT_AMBULATORY_CARE_PROVIDER_SITE_OTHER): Payer: PPO | Admitting: Gastroenterology

## 2017-06-15 ENCOUNTER — Encounter: Payer: Self-pay | Admitting: Gastroenterology

## 2017-06-15 VITALS — BP 121/67 | HR 76 | Temp 98.3°F | Ht 68.0 in | Wt 209.0 lb

## 2017-06-15 DIAGNOSIS — K922 Gastrointestinal hemorrhage, unspecified: Secondary | ICD-10-CM | POA: Diagnosis not present

## 2017-06-15 MED ORDER — PANTOPRAZOLE SODIUM 40 MG PO TBEC: 40.0000 mg | DELAYED_RELEASE_TABLET | Freq: Every day | ORAL | 6 refills | Status: DC

## 2017-06-15 NOTE — Progress Notes (Signed)
Primary Care Physician: [name], MD  Primary Gastroenterologist:  Dr. Lucilla Lame  Chief Complaint  Patient presents with  . Melena    HPI: [name] is a 71 y.o. male here for follow-up after being discharged from the hospital. The patient had an EGD without any source of bleeding at that time. There was some small ulcers the duodenum and the stomach but no active bleeding. The patient was discharged with the plan to follow-up with me as an outpatient. The patient's most recent CBC showed his hemoglobin to be 11.6 on June 21. The patient has no complaints and feels well.  Current Outpatient Prescriptions  Medication Sig Dispense Refill  . amLODipine (NORVASC) 5 MG tablet     . aspirin 81 MG chewable tablet Chew 81 mg by mouth daily.    [name] Kitchen atorvastatin (LIPITOR) 40 MG tablet Take 1 tablet (40 mg total) by mouth daily. 90 tablet 2  . citalopram (CELEXA) 10 MG tablet Take 1 tablet (10 mg total) by mouth daily. 90 tablet 1  . ferrous sulfate 325 (65 FE) MG EC tablet Take 325 mg by mouth daily.     . Glycopyrrolate-Formoterol (BEVESPI AEROSPHERE) 9-4.8 MCG/ACT AERO Inhale 2 puffs into the lungs 2 (two) times daily. 2 Inhaler 0  . metFORMIN (GLUCOPHAGE-XR) 500 MG 24 hr tablet TAKE ONE TABLET BY MOUTH EVERY DAY 90 tablet 1  . nitroGLYCERIN (NITROSTAT) 0.4 MG SL tablet Place 1 tablet (0.4 mg total) under the tongue every 5 (five) minutes as needed for chest pain. 25 tablet 6  . pantoprazole (PROTONIX) 40 MG tablet Take 1 tablet (40 mg total) by mouth daily. 30 tablet 6   No current facility-administered medications for this visit.     Allergies as of 06/15/2017 - Review Complete 06/15/2017  Allergen Reaction Noted  . Contrast media [iodinated diagnostic agents]  05/19/2017  . Xarelto [rivaroxaban] Other (See Comments) 05/02/2017  . Clindamycin/lincomycin Rash 07/05/2015  . Dye fdc red [red dye] Rash 05/24/2012  . Lovenox [enoxaparin sodium]  09/12/2014  . Penicillins Rash  and Other (See Comments) 08/16/2008    ROS:  General: Negative for anorexia, weight loss, fever, chills, fatigue, weakness. ENT: Negative for hoarseness, difficulty swallowing , nasal congestion. CV: Negative for chest pain, angina, palpitations, dyspnea on exertion, peripheral edema.  Respiratory: Negative for dyspnea at rest, dyspnea on exertion, cough, sputum, wheezing.  GI: See history of present illness. GU:  Negative for dysuria, hematuria, urinary incontinence, urinary frequency, nocturnal urination.  Endo: Negative for unusual weight change.    Physical Examination:   BP 121/67   Pulse 76   Temp 98.3 F (36.8 C) (Oral)   Ht 5\' 8"  (1.727 m)   Wt 209 lb (94.8 kg)   BMI 31.78 kg/m   General: Well-nourished, well-developed in no acute distress.  Eyes: No icterus. Conjunctivae pink. Mouth: Oropharyngeal mucosa moist and pink , no lesions erythema or exudate. Lungs: Clear to auscultation bilaterally. Non-labored. Heart: Regular rate and rhythm, no murmurs rubs or gallops.  Abdomen: Bowel sounds are normal, nontender, nondistended, no hepatosplenomegaly or masses, no abdominal bruits or hernia , no rebound or guarding.   Extremities: No lower extremity edema. No clubbing or deformities. Neuro: Alert and oriented x 3.  Grossly intact. Skin: Warm and dry, no jaundice.   Psych: Alert and cooperative, normal mood and affect.  Labs:    Imaging Studies: Dg Sniff Test  Result Date: 05/26/2017 CLINICAL DATA:  Shortness of breath. EXAM: CHEST FLUOROSCOPY  TECHNIQUE: Real-time fluoroscopic evaluation of the chest was performed. FLUOROSCOPY TIME:  Fluoroscopy Time:  1 minutes 48 seconds. Radiation Exposure Index (if provided by the fluoroscopic device): 7.9 mGy. Number of Acquired Spot Images: 3 COMPARISON:  Chest x-ray scratched it chest CT 05/14/2017. Chest x-ray 05/14/2017. FINDINGS: Minimal decrease in right diaphragmatic excursion. No paradoxical motion. Left hemidiaphragm is  normal. IMPRESSION: Minimal decrease in right diaphragmatic excursion . Otherwise negative exam . Electronically Signed   By: Marcello Moores  Register   On: 05/26/2017 10:00    Assessment and Plan:   [name] is a 71 y.o. y/o male who comes in for follow-up after being discharged from hospital. The patient's hemoglobin has been stable and has gone up. The patient has no complaint the present time. Follow-up as needed with me.    Lucilla Lame, MD. Marval Regal   Note: This dictation was prepared with Dragon dictation along with smaller phrase technology. Any transcriptional errors that result from this process are unintentional.

## 2017-06-23 ENCOUNTER — Ambulatory Visit: Payer: PPO | Admitting: Pulmonary Disease

## 2017-06-30 ENCOUNTER — Other Ambulatory Visit: Payer: Self-pay

## 2017-06-30 MED ORDER — FERROUS SULFATE 325 (65 FE) MG PO TBEC: 325.0000 mg | DELAYED_RELEASE_TABLET | Freq: Every day | ORAL | 5 refills | Status: DC

## 2017-06-30 NOTE — Telephone Encounter (Signed)
Pt left v/m wanting to know if pt needs to continue taking ferrous sulfate 325 mg; if pt needs to take iron pill call [facility]. Pt request cb. Pt last seen 05/20/17.

## 2017-06-30 NOTE — Telephone Encounter (Signed)
Mr. Tetrick notified to continue taking the iron per Dr. Lorelei Pont.  Refills have been sent to [facility].

## 2017-06-30 NOTE — Telephone Encounter (Signed)
yes

## 2017-07-02 ENCOUNTER — Encounter: Payer: Self-pay | Admitting: Pulmonary Disease

## 2017-07-02 ENCOUNTER — Ambulatory Visit (INDEPENDENT_AMBULATORY_CARE_PROVIDER_SITE_OTHER): Payer: PPO | Admitting: Pulmonary Disease

## 2017-07-02 VITALS — BP 134/68 | HR 74 | Ht 68.0 in | Wt 207.0 lb

## 2017-07-02 DIAGNOSIS — R0602 Shortness of breath: Secondary | ICD-10-CM | POA: Diagnosis not present

## 2017-07-02 DIAGNOSIS — R5383 Other fatigue: Secondary | ICD-10-CM

## 2017-07-02 DIAGNOSIS — F419 Anxiety disorder, unspecified: Secondary | ICD-10-CM

## 2017-07-02 DIAGNOSIS — R0600 Dyspnea, unspecified: Secondary | ICD-10-CM

## 2017-07-02 DIAGNOSIS — R042 Hemoptysis: Secondary | ICD-10-CM | POA: Diagnosis not present

## 2017-07-02 MED ORDER — BUSPIRONE HCL 5 MG PO TABS: 5.0000 mg | ORAL_TABLET | Freq: Three times a day (TID) | ORAL | 5 refills | Status: DC

## 2017-07-02 NOTE — Patient Instructions (Signed)
For your shortness of breath: We will arrange for a cardiopulmonary exercise stress test  For COPD: Continue taking Bivespi Get a flu shot in the fall  For generalized anxiety: Start taking buspirone 5 mg twice a day We will refer you to behavioral health at the [facility] office  For fatigue: We will arrange for a sleep study in [place]  We will see you back in about 6-8 weeks to go over these results or sooner if needed
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
## 2017-07-06 ENCOUNTER — Other Ambulatory Visit: Payer: Self-pay | Admitting: Family Medicine

## 2017-07-06 MED ORDER — CITALOPRAM HYDROBROMIDE 10 MG PO TABS: 10.0000 mg | ORAL_TABLET | Freq: Every day | ORAL | 1 refills | Status: DC

## 2017-07-08 MED ORDER — CITALOPRAM HYDROBROMIDE 10 MG PO TABS: 10.0000 mg | ORAL_TABLET | Freq: Every day | ORAL | 1 refills | Status: DC

## 2017-07-08 NOTE — Addendum Note (Signed)
Addended by: Helene Shoe on: 07/08/2017 08:56 AM   Modules accepted: Orders

## 2017-07-08 NOTE — Telephone Encounter (Addendum)
Pt is checking on citalopram refill; [facility]. Medication went to [place]. I spoke with Amy at [facility] and she will cancel citalopram refill. I [facility] with [facility] and called citalopram refill as instructed. Pt notified done and pt said to remove [place] from his chart. Advised done.

## 2017-07-12 ENCOUNTER — Encounter (HOSPITAL_COMMUNITY): Payer: PPO

## 2017-07-21 ENCOUNTER — Ambulatory Visit (HOSPITAL_COMMUNITY): Payer: PPO | Attending: Pulmonary Disease

## 2017-07-21 ENCOUNTER — Encounter (HOSPITAL_COMMUNITY): Payer: Self-pay | Admitting: *Deleted

## 2017-07-21 DIAGNOSIS — R0600 Dyspnea, unspecified: Secondary | ICD-10-CM | POA: Insufficient documentation

## 2017-07-22 ENCOUNTER — Other Ambulatory Visit (HOSPITAL_COMMUNITY): Payer: Self-pay | Admitting: *Deleted

## 2017-07-22 DIAGNOSIS — R0600 Dyspnea, unspecified: Secondary | ICD-10-CM

## 2017-07-26 ENCOUNTER — Other Ambulatory Visit: Payer: Self-pay | Admitting: Family Medicine

## 2017-07-26 DIAGNOSIS — R0600 Dyspnea, unspecified: Secondary | ICD-10-CM | POA: Diagnosis not present

## 2017-08-18 DIAGNOSIS — I25118 Atherosclerotic heart disease of native coronary artery with other forms of angina pectoris: Secondary | ICD-10-CM | POA: Insufficient documentation
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
## 2017-08-19 ENCOUNTER — Ambulatory Visit (INDEPENDENT_AMBULATORY_CARE_PROVIDER_SITE_OTHER): Payer: PPO | Admitting: Cardiovascular Disease

## 2017-08-19 ENCOUNTER — Encounter: Payer: Self-pay | Admitting: Cardiovascular Disease

## 2017-08-19 VITALS — BP 136/82 | HR 66 | Ht 68.0 in | Wt 203.5 lb

## 2017-08-19 DIAGNOSIS — E1151 Type 2 diabetes mellitus with diabetic peripheral angiopathy without gangrene: Secondary | ICD-10-CM | POA: Diagnosis not present

## 2017-08-19 DIAGNOSIS — J432 Centrilobular emphysema: Secondary | ICD-10-CM | POA: Diagnosis not present

## 2017-08-19 DIAGNOSIS — I25118 Atherosclerotic heart disease of native coronary artery with other forms of angina pectoris: Secondary | ICD-10-CM

## 2017-08-19 DIAGNOSIS — S2509XS Other specified injury of thoracic aorta, sequela: Secondary | ICD-10-CM

## 2017-08-19 DIAGNOSIS — E785 Hyperlipidemia, unspecified: Secondary | ICD-10-CM

## 2017-08-19 NOTE — Patient Instructions (Signed)

## 2017-08-26 ENCOUNTER — Ambulatory Visit: Payer: PPO | Attending: Neurology

## 2017-08-26 DIAGNOSIS — G4733 Obstructive sleep apnea (adult) (pediatric): Secondary | ICD-10-CM | POA: Insufficient documentation

## 2017-08-26 DIAGNOSIS — Z6830 Body mass index (BMI) 30.0-30.9, adult: Secondary | ICD-10-CM | POA: Diagnosis not present

## 2017-08-26 DIAGNOSIS — J984 Other disorders of lung: Secondary | ICD-10-CM | POA: Diagnosis not present

## 2017-08-26 DIAGNOSIS — G4761 Periodic limb movement disorder: Secondary | ICD-10-CM | POA: Insufficient documentation

## 2017-09-02 DIAGNOSIS — G4733 Obstructive sleep apnea (adult) (pediatric): Secondary | ICD-10-CM | POA: Diagnosis not present

## 2017-09-03 ENCOUNTER — Telehealth: Payer: Self-pay | Admitting: *Deleted

## 2017-09-03 DIAGNOSIS — G4733 Obstructive sleep apnea (adult) (pediatric): Secondary | ICD-10-CM

## 2017-09-03 NOTE — Telephone Encounter (Signed)
-----   Message from Laverle Hobby, MD sent at 09/02/2017  7:59 PM EDT ----- Regarding: sleep study results Recommend: Sleep study with CPAP titration

## 2017-09-03 NOTE — Telephone Encounter (Signed)
Patient aware. Order placed Nothing further needed.

## 2017-09-15 ENCOUNTER — Encounter: Payer: Self-pay | Admitting: Pulmonary Disease

## 2017-09-15 DIAGNOSIS — G4733 Obstructive sleep apnea (adult) (pediatric): Secondary | ICD-10-CM | POA: Insufficient documentation

## 2017-09-16 ENCOUNTER — Encounter: Payer: Self-pay | Admitting: Pulmonary Disease

## 2017-09-16 ENCOUNTER — Ambulatory Visit (INDEPENDENT_AMBULATORY_CARE_PROVIDER_SITE_OTHER): Payer: PPO | Admitting: Pulmonary Disease

## 2017-09-16 VITALS — BP 140/72 | HR 70 | Ht 68.0 in | Wt 202.4 lb

## 2017-09-16 DIAGNOSIS — J432 Centrilobular emphysema: Secondary | ICD-10-CM | POA: Diagnosis not present

## 2017-09-16 DIAGNOSIS — R0600 Dyspnea, unspecified: Secondary | ICD-10-CM | POA: Diagnosis not present

## 2017-09-16 DIAGNOSIS — G4733 Obstructive sleep apnea (adult) (pediatric): Secondary | ICD-10-CM

## 2017-09-16 DIAGNOSIS — Z23 Encounter for immunization: Secondary | ICD-10-CM | POA: Diagnosis not present

## 2017-09-16 NOTE — Progress Notes (Signed)
Subjective:    Patient ID: [name], male    DOB: 1946/07/23, 71 y.o.   MRN: [id number]  Synopsis: First referred in 2015 for dyspnea, found to have mild airflow obstruction but moderate to persistent upper lobe predominant centrilobular emphysema. He smoked for 20 years, quit in 1992 after smoking 2 ppd.  His son worked at the pulmonary rehab facility at [facility] for several years.   HPI Chief Complaint  Patient presents with  . Follow-up    review sleep study. pt states dyspnea is improved, no other breathing complaints currently.     [name] says that shortness of breath still isn't too bad.  He thinks [name] is working [employer]. He says he thinks he is doing OK.  He says his energy level decreases as the day goes on.  He has some aches and pains.   No cough, no bronchitis.   Past Medical History:  Diagnosis Date  . Aortic transection 2005   Traumatic after a fall from a second floor. s/p repair at [facility]  . Basal cell carcinoma   . CAD (coronary artery disease) 1992   a. 1992 Acute anterior MI, thrombolytic therapy-->cath reportedly w/o significant CAD; b. 08/2016 MV: EF 57%, hypertensive respons, no ischemia/infarct; c. 04/2017 CTA chest w/ coronary Ca2+.  . Depression   . Diastolic dysfunction    a. 04/2017 Echo: EF 60-65%, Gr1 DD, nl RV fxn.  . Erectile dysfunction   . Fall 2005   fell off house: torn aorta, clavicle fracture, rib fracture, vertebral fractures, lung contusion, coma x 2 weeks  . Fracture of femoral neck, left ([place]) 08/09/2014   ORIF, Dr. Marry Guan  . Fracture of radial neck, left, closed 08/09/2014  . Gallstone pancreatitis   . Gastric ulcer   . History of ATN 08/09/2014   [facility], 2 days of dialysis (ARF)  . Hyperlipidemia    Statin with joint pain   . Hypertension   . Obesity   . Peripheral vascular disease ([place])    Atherosclerotic:R renal artery stenosis  . Type II diabetes mellitus ([place])   . Upper GI bleed    a. 04/2017 admit w/ melena and HGB down to 7 req  prbc's; b. 04/2017 EGD: small HH, non-bleeding gastric & duod ulcers, non-bleeding erosive gastropathy-->PPI Rx.      Review of Systems  Constitutional: Negative for chills, fatigue and fever.  HENT: Negative for postnasal drip, rhinorrhea and sinus pain.   Respiratory: Positive for shortness of breath. Negative for chest tightness and wheezing.   Cardiovascular: Negative for chest pain, palpitations and leg swelling.       Objective:   Physical Exam Vitals:   09/16/17 0913  BP: 140/72  Pulse: 70  SpO2: 97%  Weight: 202 lb 6.4 oz (91.8 kg)  Height: 5\' 8"  (1.727 m)    Gen: well appearing HENT: OP clear, TM's clear, neck supple PULM: CTA B, normal percussion CV: RRR, no mgr, trace edema GI: BS+, soft, nontender Derm: no cyanosis or rash Psyche: normal mood and affect    Cardiology records from June 2018 reviewed were he was seen for coronary disease hypertension and hyperlipidemia.  CBC    Component Value Date/Time   WBC 7.1 05/20/2017 1158   RBC 3.80 (L) 05/20/2017 1158   HGB 11.6 (L) 05/20/2017 1158   HGB 10.6 (L) 08/23/2014 0619   HCT 33.8 (L) 05/20/2017 1158   HCT 31.5 (L) 08/23/2014 0619   PLT 222.0 Repeated and verified X2. 05/20/2017 1158  PLT 328 08/23/2014 0619   MCV 89.0 05/20/2017 1158   MCV 92 08/23/2014 0619   MCH 30.5 05/16/2017 0542   MCHC 34.2 05/20/2017 1158   RDW 14.5 05/20/2017 1158   RDW 14.3 08/23/2014 0619   LYMPHSABS 0.9 05/20/2017 1158   LYMPHSABS 1.0 08/23/2014 0619   MONOABS 0.5 05/20/2017 1158   MONOABS 0.5 08/23/2014 0619   EOSABS 0.2 05/20/2017 1158   EOSABS 0.3 08/23/2014 0619   BASOSABS 0.1 05/20/2017 1158   BASOSABS 0.0 08/23/2014 9326   Imaging: 2017 nuclear stress test unremarkable 01/2017 HRCT> moderate to severe centrilobular emphysema, no ILD, three vessel CAD noted, mild calcification aortic valve Images independently reviewed 03/08/2017 04/2017 CT angio chest > no PE, upper lobe predominant emphysema  Echocardiogram   October 2017 normal LVEF and normal estimated PA pressure  PFT: 09/2016 Ratio 64%, FEV1 2.96 L 106% predicted, FVC 4.61 L 113% predicted, total lung capacity 7.55 125% predicted, residual volume 124% predicted, DLCO 24.3 mL/m/mm of mercury 79% predicted  Bronchoscopy: 2018 Normal   Polysomnogram: 07/2017 AHI 24, O2 saturation average low 84%      Assessment & Plan:  OSA (obstructive sleep apnea) - Plan: Ambulatory Referral for DME  Dyspnea, unspecified type  Centrilobular emphysema (HCC)  Discussion: He has mild airflow obstruction which has not worsened but his energy level has dropped significantly in the last year. The polysomnogram we performed showed evidence of moderate obstructive sleep apnea which is a likely explanation for his lack of energy. Today we talked about different treatment options including avoiding caffeine, alcohol, and sedatives after noon during the daytime, and weight loss. Considering the severity of his symptoms we feel that CPAP is the best approach. I have prescribed this with an auto titrating machine today. I gave him instructions on trying to use machine during the daytime and working closely with the DME company to get the mass correct.  Plan: For COPD: Continue Bevespi Flu shot today  For Obstructive sleep apnea No driving while sleepy Start CPAP with an autotitrating machine 5-20cm H20 Work with the DME company to get the mask right  Follow up in 4-6 weeks with an NP to make sure things are going OK with CPAP     Current Outpatient Prescriptions:  .  amLODipine (NORVASC) 5 MG tablet, , Disp: , Rfl:  .  aspirin 81 MG chewable tablet, Chew 81 mg by mouth daily., Disp: , Rfl:  .  atorvastatin (LIPITOR) 40 MG tablet, Take 1 tablet (40 mg total) by mouth daily., Disp: 90 tablet, Rfl: 2 .  busPIRone (BUSPAR) 5 MG tablet, Take 1 tablet (5 mg total) by mouth 3 (three) times daily., Disp: 60 tablet, Rfl: 5 .  citalopram (CELEXA) 10 MG tablet,  Take 1 tablet (10 mg total) by mouth daily., Disp: 90 tablet, Rfl: 1 .  ferrous sulfate 325 (65 FE) MG EC tablet, Take 1 tablet (325 mg total) by mouth daily., Disp: 30 tablet, Rfl: 5 .  Glycopyrrolate-Formoterol (BEVESPI AEROSPHERE) 9-4.8 MCG/ACT AERO, Inhale 2 puffs into the lungs 2 (two) times daily., Disp: 2 Inhaler, Rfl: 0 .  metFORMIN (GLUCOPHAGE-XR) 500 MG 24 hr tablet, TAKE ONE TABLET EVERY DAY, Disp: 90 tablet, Rfl: 0 .  pantoprazole (PROTONIX) 40 MG tablet, Take 1 tablet (40 mg total) by mouth daily., Disp: 30 tablet, Rfl: 6 .  nitroGLYCERIN (NITROSTAT) 0.4 MG SL tablet, Place 1 tablet (0.4 mg total) under the tongue every 5 (five) minutes as needed for chest pain., Disp: 25  tablet, Rfl: 6

## 2017-09-16 NOTE — Patient Instructions (Signed)
For COPD: Continue Bevespi Flu shot today  For Obstructive sleep apnea No driving while sleepy Start CPAP with an autotitrating machine 5-20cm H20 Work with the DME company to get the mask right  Follow up in 4-6 weeks with an NP to make sure things are going OK with CPAP

## 2017-09-17 ENCOUNTER — Telehealth: Payer: Self-pay | Admitting: Pulmonary Disease

## 2017-09-17 NOTE — Telephone Encounter (Signed)
Called and spoke with pt and he stated that he was seen yesterday by BQ and that we were going to send the order to [facility] for a cpap to be set up for him.  He stated that he got a call from the sleep center about setting up a time for the cpap titration---this is scheduled for Oct 31.  He wanted to make sure that this needed to be done---he stated that BQ did not speak to him about this. BQ please advise. thanks

## 2017-09-21 NOTE — Telephone Encounter (Signed)
Called pt and advised message from the provider. Pt understood and verbalized understanding. Nothing further is needed.    

## 2017-09-21 NOTE — Telephone Encounter (Signed)
No CPAP titration, just auto titrating CPAP

## 2017-09-23 DIAGNOSIS — G4733 Obstructive sleep apnea (adult) (pediatric): Secondary | ICD-10-CM | POA: Diagnosis not present

## 2017-09-23 DIAGNOSIS — J441 Chronic obstructive pulmonary disease with (acute) exacerbation: Secondary | ICD-10-CM | POA: Diagnosis not present

## 2017-09-30 ENCOUNTER — Ambulatory Visit (INDEPENDENT_AMBULATORY_CARE_PROVIDER_SITE_OTHER): Payer: PPO | Admitting: Psychology

## 2017-09-30 DIAGNOSIS — F411 Generalized anxiety disorder: Secondary | ICD-10-CM

## 2017-10-04 ENCOUNTER — Other Ambulatory Visit: Payer: Self-pay | Admitting: Family Medicine

## 2017-10-14 ENCOUNTER — Ambulatory Visit: Payer: PPO | Admitting: Acute Care

## 2017-10-19 ENCOUNTER — Other Ambulatory Visit: Payer: Self-pay | Admitting: Family Medicine

## 2017-10-24 DIAGNOSIS — J441 Chronic obstructive pulmonary disease with (acute) exacerbation: Secondary | ICD-10-CM | POA: Diagnosis not present

## 2017-10-24 DIAGNOSIS — G4733 Obstructive sleep apnea (adult) (pediatric): Secondary | ICD-10-CM | POA: Diagnosis not present

## 2017-10-26 ENCOUNTER — Other Ambulatory Visit: Payer: Self-pay | Admitting: Family Medicine

## 2017-11-08 ENCOUNTER — Ambulatory Visit: Payer: PPO | Admitting: Acute Care

## 2017-11-15 ENCOUNTER — Other Ambulatory Visit: Payer: Self-pay | Admitting: Family Medicine

## 2017-11-17 ENCOUNTER — Ambulatory Visit: Payer: PPO | Admitting: Acute Care

## 2017-11-17 ENCOUNTER — Ambulatory Visit (INDEPENDENT_AMBULATORY_CARE_PROVIDER_SITE_OTHER): Payer: PPO | Admitting: Psychology

## 2017-11-17 ENCOUNTER — Encounter: Payer: Self-pay | Admitting: Acute Care

## 2017-11-17 VITALS — BP 118/78 | HR 86 | Ht 68.0 in | Wt 217.0 lb

## 2017-11-17 DIAGNOSIS — G4733 Obstructive sleep apnea (adult) (pediatric): Secondary | ICD-10-CM | POA: Diagnosis not present

## 2017-11-17 DIAGNOSIS — Z9989 Dependence on other enabling machines and devices: Secondary | ICD-10-CM

## 2017-11-17 DIAGNOSIS — F411 Generalized anxiety disorder: Secondary | ICD-10-CM

## 2017-11-17 NOTE — Progress Notes (Signed)
I have reviewed and agree with assessment/plan.  Chesley Mires, MD [facility] Pulmonary/Critical Care 11/17/2017, 5:00 PM Pager:  [phone]

## 2017-11-17 NOTE — Assessment & Plan Note (Signed)
OSA on CPAP start date 09/2017 Auto Set 5-20 cm H2O Compliant with therapy AHI 1.7 from 24>>> Benefitting from therapy Plan: We will continue the current CPAP settings. Continue on CPAP at bedtime. You appear to be benefiting from the treatment Goal is to wear for at least 6 hours each night for maximal clinical benefit. Continue to work on weight loss, as the link between excess weight  and sleep apnea is well established.  Do not drive if sleepy. Remember to clean mask, tubing, filter, and reservoir once weekly with soapy water.  Follow up with Dr. Halford Chessman or Elsworth Soho or Valena Ivanov NP In 6 months or before as needed.  Please contact office for sooner follow up if symptoms do not improve or worsen or seek emergency care

## 2017-11-17 NOTE — Progress Notes (Signed)
History of Present Illness [name] is a 71 y.o. male former smoker with    Synopsis: First referred in 2015 for dyspnea, found to have mild airflow obstruction but moderate to persistent upper lobe predominant centrilobular emphysema. He smoked for 20 years, quit in 1992 after smoking 2 ppd.  His son worked at the pulmonary rehab facility at [facility] for several years.  11/17/2017 Follow Up OV: Pt returns for follow up. Pt was seen by Dr. Lake Rhodes 09/16/2017 for OSA. He was started on CPAP about Nov. 1 2018. He started on settings of Auto set 5-20 cm H2O. He states he is doing well. He is more awake during the day. He is more alert and less drowsy. He states he sleeps through night uninterrupted. He feels much better since starting therapy. He denies chest pain, fever, othopnea or hemoptysis.  Test Results: CPAP Down Load 10/18/2017>> 11/16/2017 Usage > 29/30 days ( 97%) > 4 hours  29/30 days < 4 hours 0 days Auto Set 5-20 cm H2O Median pressure 8.4 cm H2O Maximum pressure 15.3 cm H2O AHI 1.7 Central 0.5 Obstructive 0.8   Imaging: 2017 nuclear stress test unremarkable 01/2017 HRCT> moderate to severe centrilobular emphysema, no ILD, three vessel CAD noted, mild calcification aortic valve Images independently reviewed 03/08/2017 04/2017 CT angio chest > no PE, upper lobe predominant emphysema  Echocardiogram  October 2017 normal LVEF and normal estimated PA pressure  PFT: 09/2016 Ratio 64%, FEV1 2.96 L 106% predicted, FVC 4.61 L 113% predicted, total lung capacity 7.55 125% predicted, residual volume 124% predicted, DLCO 24.3 mL/m/mm of mercury 79% predicted  Bronchoscopy: 2018 Normal   Polysomnogram: 07/2017 AHI 24, O2 saturation average low 84%    CBC Latest Ref Rng & Units 05/20/2017 05/16/2017 05/15/2017  WBC 4.0 - 10.5 K/uL 7.1 18.8(H) 36.1(H)  Hemoglobin 13.0 - 17.0 g/dL 11.6(L) 11.6(L) 12.2(L)  Hematocrit 39.0 - 52.0 % 33.8(L) 33.4(L) 36.0(L)  Platelets 150.0 -  400.0 K/uL 222.0 Repeated and verified X2. 150 166    BMP Latest Ref Rng & Units 05/20/2017 05/15/2017 05/14/2017  Glucose 70 - 99 mg/dL 163(H) 159(H) 231(H)  BUN 6 - 23 mg/dL 15 29(H) 32(H)  Creatinine 0.40 - 1.50 mg/dL 0.92 1.09 1.25(H)  Sodium 135 - 145 mEq/L 138 134(L) 132(L)  Potassium 3.5 - 5.1 mEq/L 4.8 4.1 3.4(L)  Chloride 96 - 112 mEq/L 107 106 100(L)  CO2 19 - 32 mEq/L 25 23 22   Calcium 8.4 - 10.5 mg/dL 9.4 8.0(L) 9.0       Past medical hx Past Medical History:  Diagnosis Date  . Aortic transection 2005   Traumatic after a fall from a second floor. s/p repair [facility]  . Basal cell carcinoma   . CAD (coronary artery disease) 1992   a. 1992 Acute anterior MI, thrombolytic therapy-->cath reportedly w/o significant CAD; b. 08/2016 MV: EF 57%, hypertensive respons, no ischemia/infarct; c. 04/2017 CTA chest w/ coronary Ca2+.  . Depression   . Diastolic dysfunction    a. 04/2017 Echo: EF 60-65%, Gr1 DD, nl RV fxn.  . Erectile dysfunction   . Fall 2005   fell off house: torn aorta, clavicle fracture, rib fracture, vertebral fractures, lung contusion, coma x 2 weeks  . Fracture of femoral neck, left ([place]) 08/09/2014   ORIF, Dr. Marry Guan  . Fracture of radial neck, left, closed 08/09/2014  . Gallstone pancreatitis   . Gastric ulcer   . History of ATN 08/09/2014   [facility], 2 days of dialysis (  ARF)  . Hyperlipidemia    Statin with joint pain   . Hypertension   . Obesity   . Peripheral vascular disease ([place])    Atherosclerotic:R renal artery stenosis  . Type II diabetes mellitus ([place])   . Upper GI bleed    a. 04/2017 admit w/ melena and HGB down to 7 req prbc's; b. 04/2017 EGD: small HH, non-bleeding gastric & duod ulcers, non-bleeding erosive gastropathy-->PPI Rx.     Social History   Tobacco Use  . Smoking status: Former Smoker    Packs/day: 1.50    Years: 20.00    Pack years: 30.00    Types: Cigarettes    Last attempt to quit: 09/13/1991    Years since quitting: 26.1  .  Smokeless tobacco: Never Used  . Tobacco comment: quit post MI  Substance Use Topics  . Alcohol use: No  . Drug use: No    [name] reports that he quit smoking about 26 years ago. His smoking use included cigarettes. He has a 30.00 pack-year smoking history. he has never used smokeless tobacco. He reports that he does not drink alcohol or use drugs.  Tobacco Cessation: Former smoker quit 1992 with a 30 pack year smoking hisotry   Past surgical hx, Family hx, Social hx all reviewed.  Current Outpatient Medications on File Prior to Visit  Medication Sig  . amLODipine (NORVASC) 5 MG tablet TAKE ONE TABLET BY MOUTH EVERY DAY  . aspirin 81 MG chewable tablet Chew 81 mg by mouth daily.  [name] Kitchen atorvastatin (LIPITOR) 40 MG tablet TAKE 1 TABLET BY MOUTH ONCE DAILY  . busPIRone (BUSPAR) 5 MG tablet Take 1 tablet (5 mg total) by mouth 3 (three) times daily.  . citalopram (CELEXA) 10 MG tablet TAKE ONE TABLET BY MOUTH EVERY DAY  . ferrous sulfate 325 (65 FE) MG EC tablet Take 1 tablet (325 mg total) by mouth daily.  . Glycopyrrolate-Formoterol (BEVESPI AEROSPHERE) 9-4.8 MCG/ACT AERO Inhale 2 puffs into the lungs 2 (two) times daily.  . metFORMIN (GLUCOPHAGE-XR) 500 MG 24 hr tablet TAKE ONE TABLET BY MOUTH EVERY DAY  . pantoprazole (PROTONIX) 40 MG tablet Take 1 tablet (40 mg total) by mouth daily.  . nitroGLYCERIN (NITROSTAT) 0.4 MG SL tablet Place 1 tablet (0.4 mg total) under the tongue every 5 (five) minutes as needed for chest pain.   No current facility-administered medications on file prior to visit.      Allergies  Allergen Reactions  . Contrast Media [Iodinated Diagnostic Agents]     Severe hives  . Xarelto [Rivaroxaban] Other (See Comments)    Reaction: GI Bleed  . Clindamycin/Lincomycin Rash  . Dye Fdc Red [Red Dye] Rash  . Lovenox [Enoxaparin Sodium]     Rash, blackness at injection site. Multifactorial and multiple other drugs used at the same time.  [name] Kitchen Penicillins Rash and Other  (See Comments)    Has patient had a PCN reaction causing immediate rash, facial/tongue/throat swelling, SOB or lightheadedness with hypotension: Unknown Has patient had a PCN reaction causing severe rash involving mucus membranes or skin necrosis: Unknown Has patient had a PCN reaction that required hospitalization: Unknown Has patient had a PCN reaction occurring within the last 10 years: No If all of the above answers are "NO", then may proceed with Cephalosporin use.     Review Of Systems:  Constitutional:   No  weight loss, night sweats,  Fevers, chills, fatigue, or  lassitude.  HEENT:   No headaches,  Difficulty swallowing,  Tooth/dental problems, or  Sore throat,                No sneezing, itching, ear ache, nasal congestion, post nasal drip,   CV:  No chest pain,  Orthopnea, PND, swelling in lower extremities, anasarca, dizziness, palpitations, syncope.   GI  No heartburn, indigestion, abdominal pain, nausea, vomiting, diarrhea, change in bowel habits, loss of appetite, bloody stools.   Resp: No shortness of breath with exertion or at rest.  No excess mucus, no productive cough,  No non-productive cough,  No coughing up of blood.  No change in color of mucus.  No wheezing.  No chest wall deformity  Skin: no rash or lesions.  GU: no dysuria, change in color of urine, no urgency or frequency.  No flank pain, no hematuria   MS:  No joint pain or swelling.  No decreased range of motion.  No back pain.  Psych:  No change in mood or affect. No depression or anxiety.  No memory loss.   Vital Signs BP 118/78 (BP Location: Left Arm, Cuff Size: Normal)   Pulse 86   Ht 5\' 8"  (1.727 m)   Wt 217 lb (98.4 kg)   SpO2 95%   BMI 32.99 kg/m    Physical Exam:  General- No distress,  A&Ox3, pleasant ENT: No sinus tenderness, TM clear, pale nasal mucosa, no oral exudate,no post nasal drip, no LAN Cardiac: S1, S2, regular rate and rhythm, no murmur Chest: No wheeze/ rales/ dullness;  no accessory muscle use, no nasal flaring, no sternal retractions Abd.: Soft Non-tender, obese Ext: No clubbing cyanosis, edema Neuro:  normal strength Skin: No rashes, warm and dry Psych: normal mood and behavior   Assessment/Plan  OSA (obstructive sleep apnea) OSA on CPAP start date 09/2017 Auto Set 5-20 cm H2O Compliant with therapy AHI 1.7 from 24>>> Benefitting from therapy Plan: We will continue the current CPAP settings. Continue on CPAP at bedtime. You appear to be benefiting from the treatment Goal is to wear for at least 6 hours each night for maximal clinical benefit. Continue to work on weight loss, as the link between excess weight  and sleep apnea is well established.  Do not drive if sleepy. Remember to clean mask, tubing, filter, and reservoir once weekly with soapy water.  Follow up with Dr. Halford Chessman or Elsworth Soho or Sarah NP In 6 months or before as needed.  Please contact office for sooner follow up if symptoms do not improve or worsen or seek emergency care      Magdalen Spatz, NP 11/17/2017  4:26 PM

## 2017-11-17 NOTE — Patient Instructions (Signed)
It is nice to meet you today. We will continue the current CPAP settings. Continue on CPAP at bedtime. You appear to be benefiting from the treatment Goal is to wear for at least 6 hours each night for maximal clinical benefit. Continue to work on weight loss, as the link between excess weight  and sleep apnea is well established.  Do not drive if sleepy. Remember to clean mask, tubing, filter, and reservoir once weekly with soapy water.  Follow up with Dr. Halford Chessman or Elsworth Soho or Charmain Diosdado NP In 6 months or before as needed.  Please contact office for sooner follow up if symptoms do not improve or worsen or seek emergency care

## 2017-11-23 DIAGNOSIS — G4733 Obstructive sleep apnea (adult) (pediatric): Secondary | ICD-10-CM | POA: Diagnosis not present

## 2017-11-23 DIAGNOSIS — J441 Chronic obstructive pulmonary disease with (acute) exacerbation: Secondary | ICD-10-CM | POA: Diagnosis not present

## 2017-11-25 ENCOUNTER — Other Ambulatory Visit: Payer: Self-pay | Admitting: Family Medicine

## 2018-01-03 ENCOUNTER — Encounter: Payer: Self-pay | Admitting: Family Medicine

## 2018-01-06 ENCOUNTER — Telehealth: Payer: Self-pay | Admitting: Acute Care

## 2018-01-06 NOTE — Telephone Encounter (Signed)
PCC - please advise. 

## 2018-01-06 NOTE — Telephone Encounter (Signed)
[facility] has access to Epic so we should not need to fax note.  Called [facility] & spoke to Cedar.  He states Compliance Team should have reached out to him or Melissa for this info.  He has pulled SG's note from Epic & will send to Compliance Team.  Nothing further needed from [place] per [place].

## 2018-01-14 ENCOUNTER — Other Ambulatory Visit: Payer: Self-pay | Admitting: Family Medicine

## 2018-01-14 ENCOUNTER — Other Ambulatory Visit: Payer: Self-pay | Admitting: Pulmonary Disease

## 2018-01-26 ENCOUNTER — Other Ambulatory Visit: Payer: Self-pay | Admitting: Family Medicine

## 2018-01-28 ENCOUNTER — Telehealth: Payer: Self-pay

## 2018-01-28 DIAGNOSIS — E785 Hyperlipidemia, unspecified: Secondary | ICD-10-CM

## 2018-01-28 DIAGNOSIS — I10 Essential (primary) hypertension: Secondary | ICD-10-CM

## 2018-01-28 DIAGNOSIS — N400 Enlarged prostate without lower urinary tract symptoms: Secondary | ICD-10-CM

## 2018-01-28 DIAGNOSIS — E1151 Type 2 diabetes mellitus with diabetic peripheral angiopathy without gangrene: Secondary | ICD-10-CM

## 2018-01-28 NOTE — Telephone Encounter (Signed)
CPE lab orders generated based on following message:  Copland, Frederico Hamman, MD  Candis Musa R, LPN        FLP, hyperlipidemia  A1c, diabetes mellitus 2, controlled  Urine microalbumin, diabetes mellitus 2, controlled  Cbc with diff, HFP, BMET: long-term medication use  PSA: bph, medicare psa   Previous Messages    ----- Message -----  From: Eustace Pen, LPN  Sent: 7/91/5041  4:18 PM  To: [name], MD  Subject: Labs 3/4                     Lab orders needed. Thank you.   Insurance:  [employer]

## 2018-02-01 ENCOUNTER — Ambulatory Visit (INDEPENDENT_AMBULATORY_CARE_PROVIDER_SITE_OTHER): Payer: PPO

## 2018-02-01 VITALS — BP 118/70 | HR 85 | Temp 99.1°F | Ht 67.0 in | Wt 217.8 lb

## 2018-02-01 DIAGNOSIS — Z0000 Encounter for general adult medical examination without abnormal findings: Secondary | ICD-10-CM

## 2018-02-01 DIAGNOSIS — E785 Hyperlipidemia, unspecified: Secondary | ICD-10-CM | POA: Diagnosis not present

## 2018-02-01 DIAGNOSIS — I10 Essential (primary) hypertension: Secondary | ICD-10-CM

## 2018-02-01 DIAGNOSIS — N400 Enlarged prostate without lower urinary tract symptoms: Secondary | ICD-10-CM

## 2018-02-01 DIAGNOSIS — E1151 Type 2 diabetes mellitus with diabetic peripheral angiopathy without gangrene: Secondary | ICD-10-CM

## 2018-02-01 LAB — CBC WITH DIFFERENTIAL/PLATELET
Basophils Absolute: 0.1 10*3/uL (ref 0.0–0.1)
Basophils Relative: 0.7 % (ref 0.0–3.0)
Eosinophils Absolute: 0.2 10*3/uL (ref 0.0–0.7)
Eosinophils Relative: 2 % (ref 0.0–5.0)
HCT: 44 % (ref 39.0–52.0)
Hemoglobin: 15.7 g/dL (ref 13.0–17.0)
Lymphocytes Relative: 12.6 % (ref 12.0–46.0)
Lymphs Abs: 1.2 10*3/uL (ref 0.7–4.0)
MCHC: 35.6 g/dL (ref 30.0–36.0)
MCV: 84.6 fl (ref 78.0–100.0)
Monocytes Absolute: 0.8 10*3/uL (ref 0.1–1.0)
Monocytes Relative: 8.2 % (ref 3.0–12.0)
Neutro Abs: 7.1 10*3/uL (ref 1.4–7.7)
Neutrophils Relative %: 76.5 % (ref 43.0–77.0)
Platelets: 182 10*3/uL (ref 150.0–400.0)
RBC: 5.21 Mil/uL (ref 4.22–5.81)
RDW: 13.6 % (ref 11.5–15.5)
WBC: 9.3 10*3/uL (ref 4.0–10.5)

## 2018-02-01 LAB — MICROALBUMIN / CREATININE URINE RATIO
Creatinine,U: 76.3 mg/dL
MICROALB/CREAT RATIO: 10.3 mg/g (ref 0.0–30.0)
Microalb, Ur: 7.9 mg/dL — ABNORMAL HIGH (ref 0.0–1.9)

## 2018-02-01 LAB — HEPATIC FUNCTION PANEL
ALT: 12 U/L (ref 0–53)
AST: 11 U/L (ref 0–37)
Albumin: 4.8 g/dL (ref 3.5–5.2)
Alkaline Phosphatase: 121 U/L — ABNORMAL HIGH (ref 39–117)
Bilirubin, Direct: 0.3 mg/dL (ref 0.0–0.3)
Total Bilirubin: 1.2 mg/dL (ref 0.2–1.2)
Total Protein: 7.2 g/dL (ref 6.0–8.3)

## 2018-02-01 LAB — LIPID PANEL
Cholesterol: 120 mg/dL (ref 0–200)
HDL: 38.2 mg/dL — ABNORMAL LOW
LDL Cholesterol: 63 mg/dL (ref 0–99)
NonHDL: 81.97
Total CHOL/HDL Ratio: 3
Triglycerides: 96 mg/dL (ref 0.0–149.0)
VLDL: 19.2 mg/dL (ref 0.0–40.0)

## 2018-02-01 LAB — BASIC METABOLIC PANEL WITH GFR
BUN: 15 mg/dL (ref 6–23)
CO2: 28 meq/L (ref 19–32)
Calcium: 10.1 mg/dL (ref 8.4–10.5)
Chloride: 103 meq/L (ref 96–112)
Creatinine, Ser: 1.14 mg/dL (ref 0.40–1.50)
GFR: 67.36 mL/min
Glucose, Bld: 119 mg/dL — ABNORMAL HIGH (ref 70–99)
Potassium: 4.7 meq/L (ref 3.5–5.1)
Sodium: 139 meq/L (ref 135–145)

## 2018-02-01 LAB — PSA, MEDICARE: PSA: 1.77 ng/mL (ref 0.10–4.00)

## 2018-02-01 LAB — HEMOGLOBIN A1C: Hgb A1c MFr Bld: 6.7 % — ABNORMAL HIGH (ref 4.6–6.5)

## 2018-02-01 NOTE — Progress Notes (Signed)
Subjective:   [name] is a 72 y.o. male who presents for Medicare Annual/Subsequent preventive examination.  Review of Systems:  N/A Cardiac Risk Factors include: advanced age (>62men, >45 women);male gender;obesity (BMI >30kg/m2);dyslipidemia;diabetes mellitus;hypertension     Objective:    Vitals: BP 118/70 (BP Location: Right Arm, Patient Position: Sitting, Cuff Size: Normal)   Pulse 85   Temp 99.1 F (37.3 C) (Oral)   Ht 5\' 7"  (1.702 m) Comment: no shoes  Wt 217 lb 12 oz (98.8 kg)   SpO2 95%   BMI 34.10 kg/m   Body mass index is 34.1 kg/m.  Advanced Directives 02/01/2018 05/14/2017 05/14/2017 05/03/2017 05/02/2017  Does Patient Have a Medical Advance Directive? Yes Yes No;Yes Yes Yes  Type of Paramedic of [place];Living will Healthcare Power of [place] -  Does patient want to make changes to medical advance directive? - No - Patient declined - No - Patient declined -  Copy of [place] in Chart? No - copy requested Yes - No - copy requested -    Tobacco Social History   Tobacco Use  Smoking Status Former Smoker  . Packs/day: 1.50  . Years: 20.00  . Pack years: 30.00  . Types: Cigarettes  . Last attempt to quit: 09/13/1991  . Years since quitting: 26.4  Smokeless Tobacco Never Used  Tobacco Comment   quit post MI     Counseling given: No Comment: quit post MI   Clinical Intake:  Pre-visit preparation completed: Yes  Pain : No/denies pain Pain Score: 0-No pain     Nutritional Status: BMI > 30  Obese Nutritional Risks: None Diabetes: Yes CBG done?: No Did pt. bring in CBG monitor from home?: No  How often do you need to have someone help you when you read instructions, pamphlets, or other written materials from your doctor or pharmacy?: 1 - Never What is the last grade level you completed in school?: 12th grade + 2 yrs college  Interpreter Needed?: No  Comments: pt lives  with spouse Information entered by :: LPinson, LPN  Past Medical History:  Diagnosis Date  . Aortic transection 2005   Traumatic after a fall from a second floor. s/p repair [facility]  . Basal cell carcinoma   . CAD (coronary artery disease) 1992   a. 1992 Acute anterior MI, thrombolytic therapy-->cath reportedly w/o significant CAD; b. 08/2016 MV: EF 57%, hypertensive respons, no ischemia/infarct; c. 04/2017 CTA chest w/ coronary Ca2+.  . Depression   . Diastolic dysfunction    a. 04/2017 Echo: EF 60-65%, Gr1 DD, nl RV fxn.  . Erectile dysfunction   . Fall 2005   fell off house: torn aorta, clavicle fracture, rib fracture, vertebral fractures, lung contusion, coma x 2 weeks  . Fracture of femoral neck, left ([place]) 08/09/2014   ORIF, Dr. Marry Guan  . Fracture of radial neck, left, closed 08/09/2014  . Gallstone pancreatitis   . Gastric ulcer   . History of ATN 08/09/2014   [facility], 2 days of dialysis ([facility])  . Hyperlipidemia    Statin with joint pain   . Hypertension   . Obesity   . Peripheral vascular disease ([place])    Atherosclerotic:R renal artery stenosis  . Type II diabetes mellitus ([place])   . Upper GI bleed    a. 04/2017 admit w/ melena and HGB down to 7 req prbc's; b. 04/2017 EGD: small HH, non-bleeding gastric & duod ulcers, non-bleeding  erosive gastropathy-->PPI Rx.   Past Surgical History:  Procedure Laterality Date  . CARDIAC CATHETERIZATION  08/1991   50 % mid-Lad stenosis with clot, 25-505 second marginal  . CARDIOVASCULAR SURGERY     with ruptured Aorta, Dr. Camila Li, [facility]   . CHOLECYSTECTOMY    . ESOPHAGOGASTRODUODENOSCOPY (EGD) WITH PROPOFOL N/A 05/03/2017   Procedure: ESOPHAGOGASTRODUODENOSCOPY (EGD) WITH PROPOFOL;  Surgeon: Lucilla Lame, MD;  Location: [facility] ENDOSCOPY;  Service: Endoscopy;  Laterality: N/A;  . KNEE SURGERY     Right   . ORIF HIP FRACTURE Left 08/12/2014   Dr. Marry Guan  . THORACOTOMY     thoracic aorta repair   . TRACHEOSTOMY     s/p reversal  . VIDEO  BRONCHOSCOPY Bilateral 05/27/2017   Procedure: VIDEO BRONCHOSCOPY WITHOUT FLUORO;  Surgeon: Juanito Doom, MD;  Location: [facility] ENDOSCOPY;  Service: Cardiopulmonary;  Laterality: Bilateral;   Family History  Problem Relation Age of Onset  . Dementia Mother 61  . Heart attack Father 52  . Diabetes Brother 45  . Heart attack Brother   . Diabetes Brother   . Colon cancer Neg Hx   . Stomach cancer Neg Hx   . Esophageal cancer Neg Hx   . Rectal cancer Neg Hx    Social History   Socioeconomic History  . Marital status: Married    Spouse name: None  . Number of children: None  . Years of education: None  . Highest education level: None  Social Needs  . Financial resource strain: None  . Food insecurity - worry: None  . Food insecurity - inability: None  . Transportation needs - medical: None  . Transportation needs - non-medical: None  Occupational History  . 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 Smoking status: Former Smoker    Packs/day: 1.50    Years: 20.00    Pack years: 30.00    Types: Cigarettes    Last attempt to quit: 09/13/1991    Years since quitting: 26.4  . Smokeless tobacco: Never Used  . Tobacco comment: quit post MI  Substance and Sexual Activity  . Alcohol use: No  . Drug use: No  . Sexual activity: None  Other Topics Concern  . None  Social History Narrative   No regular exercise     Outpatient Encounter Medications as of 02/01/2018  Medication Sig  . amLODipine (NORVASC) 5 MG tablet TAKE ONE TABLET BY MOUTH EVERY DAY  . aspirin 81 MG chewable tablet Chew 81 mg by mouth daily.  [name] Kitchen atorvastatin (LIPITOR) 40 MG tablet TAKE 1 TABLET BY MOUTH ONCE DAILY  . busPIRone (BUSPAR) 5 MG tablet TAKE ONE TABLET 3 TIMES DAILY  . citalopram (CELEXA) 10 MG tablet TAKE ONE TABLET BY MOUTH EVERY DAY  . FEROSUL 325 (65 Fe) MG tablet TAKE ONE TABLET EVERY DAY  . Glycopyrrolate-Formoterol (BEVESPI AEROSPHERE) 9-4.8 MCG/ACT AERO  Inhale 2 puffs into the lungs 2 (two) times daily.  . metFORMIN (GLUCOPHAGE-XR) 500 MG 24 hr tablet TAKE ONE TABLET BY MOUTH EVERY DAY  . pantoprazole (PROTONIX) 40 MG tablet Take 1 tablet (40 mg total) by mouth daily.  . nitroGLYCERIN (NITROSTAT) 0.4 MG SL tablet Place 1 tablet (0.4 mg total) under the tongue every 5 (five) minutes as needed for chest pain.   No facility-administered encounter medications on file as of 02/01/2018.     Activities of Daily Living In your present state of health, do you have any difficulty  performing the following activities: 02/01/2018 05/14/2017  Hearing? N N  Vision? N N  Difficulty concentrating or making decisions? N N  Walking or climbing stairs? N N  Dressing or bathing? N N  Doing errands, shopping? N N  Preparing Food and eating ? N -  Using the Toilet? N -  In the past six months, have you accidently leaked urine? N -  Do you have problems with loss of bowel control? N -  Managing your Medications? N -  Managing your Finances? N -  Housekeeping or managing your Housekeeping? N -  Some recent data might be hidden    Patient Care Team: [name], MD as PCP - General Sydnee Levans, MD as Consulting Physician (Dermatology)   Assessment:   This is a routine wellness examination for Jailyn.  Exercise Activities and Dietary recommendations Current Exercise Habits: The patient does not participate in regular exercise at present, Exercise limited by: None identified  Goals    . DIET - INCREASE WATER INTAKE     Starting 02/01/2018, I will continue to drink at least 1 gallon of water daily.        Fall Risk Fall Risk  02/01/2018 01/27/2017 11/18/2015 07/20/2013  Falls in the past year? No No No Yes   Depression Screen PHQ 2/9 Scores 02/01/2018 01/27/2017 11/18/2015 07/20/2013  PHQ - 2 Score 0 0 0 0  PHQ- 9 Score 0 - - -    Cognitive Function MMSE - Mini Mental State Exam 02/01/2018  Orientation to time 5  Orientation to Place 5    Registration 3  Attention/ Calculation 0  Recall 3  Language- name 2 objects 0  Language- repeat 1  Language- follow 3 step command 2  Language- follow 3 step command-comments unable to follow 1 step of 3 step command  Language- read & follow direction 0  Write a sentence 0  Copy design 0  Total score 19       PLEASE NOTE: A Mini-Cog screen was completed. Maximum score is 20. A value of 0 denotes this part of [place] MMSE was not completed or the patient failed this part of the Mini-Cog screening.   Mini-Cog Screening Orientation to Time - Max 5 pts Orientation to Place - Max 5 pts Registration - Max 3 pts Recall - Max 3 pts Language Repeat - Max 1 pts Language Follow 3 Step Command - Max 3 pts   Immunization History  Administered Date(s) Administered  . Influenza Split 10/04/2012  . Influenza Whole 08/16/2008, 08/01/2014  . Influenza, High Dose Seasonal PF 09/16/2017  . Influenza,inj,Quad PF,6+ Mos 10/11/2013, 10/18/2015, 08/24/2016  . Pneumococcal Conjugate-13 11/18/2015  . Pneumococcal Polysaccharide-23 06/13/2012  . Td 07/01/2015    Screening Tests Health Maintenance  Topic Date Due  . FOOT EXAM  02/10/2018 (Originally 07/21/2014)  . OPHTHALMOLOGY EXAM  02/27/2018  . HEMOGLOBIN A1C  08/04/2018  . URINE MICROALBUMIN  02/02/2019  . COLONOSCOPY  07/20/2022  . TETANUS/TDAP  06/30/2025  . INFLUENZA VACCINE  Completed  . Hepatitis C Screening  Completed  . PNA vac Low Risk Adult  Completed     Plan:     I have personally reviewed, addressed, and noted the following in the patient's chart:  A. Medical and social history B. Use of alcohol, tobacco or illicit drugs  C. Current medications and supplements D. Functional ability and status E.  Nutritional status F.  Physical activity G. Advance directives H. List of other physicians I.  Hospitalizations,  surgeries, and ER visits in previous 12 months J.  [place] to include hearing, vision,  cognitive, depression L. Referrals and appointments - none  In addition, I have reviewed and discussed with patient certain preventive protocols, quality metrics, and best practice recommendations. A written personalized care plan for preventive services as well as general preventive health recommendations were provided to patient.  See attached scanned questionnaire for additional information.   Signed,   Lindell Noe, MHA, BS, LPN Health Coach

## 2018-02-01 NOTE — Progress Notes (Signed)
Pre visit review using our clinic review tool, if applicable. No additional management support is needed unless otherwise documented below in the visit note. 

## 2018-02-01 NOTE — Progress Notes (Signed)
PCP notes:   Health maintenance:  Foot exam - PCP please address at next appt A1C - completed Microalbumin - completed Eye exam - per pt, exam in March 2018  Abnormal screenings:   Mini-Cog score: 19/20 MMSE - Mini Mental State Exam 02/01/2018  Orientation to time 5  Orientation to Place 5  Registration 3  Attention/ Calculation 0  Recall 3  Language- name 2 objects 0  Language- repeat 1  Language- follow 3 step command 2  Language- follow 3 step command-comments unable to follow 1 step of 3 step command  Language- read & follow direction 0  Write a sentence 0  Copy design 0  Total score 19       Hearing - failed  Hearing Screening   125Hz  250Hz  500Hz  1000Hz  2000Hz  3000Hz  4000Hz  6000Hz  8000Hz   Right ear:   40 40 40  0    Left ear:   40 40 40  40     Patient concerns:   None  Nurse concerns:  None  Next PCP appt:   02/10/2018 @ 1040

## 2018-02-01 NOTE — Patient Instructions (Signed)
[name] , Thank you for taking time to come for your Medicare Wellness Visit. I appreciate your ongoing commitment to your health goals. Please review the following plan we discussed and let me know if I can assist you in the future.   These are the goals we discussed: Goals    . DIET - INCREASE WATER INTAKE     Starting 02/01/2018, I will continue to drink at least 1 gallon of water daily.        This is a list of the screening recommended for you and due dates:  Health Maintenance  Topic Date Due  . Complete foot exam   02/10/2018*  . Eye exam for diabetics  02/27/2018  . Hemoglobin A1C  08/04/2018  . Urine Protein Check  02/02/2019  . Colon Cancer Screening  07/20/2022  . Tetanus Vaccine  06/30/2025  . Flu Shot  Completed  .  Hepatitis C: One time screening is recommended by Center for Disease Control  (CDC) for  adults born from [birth year] through 1964.   Completed  . Pneumonia vaccines  Completed  *Topic was postponed. The date shown is not the original due date.   Preventive Care for Adults  A healthy lifestyle and preventive care can promote health and wellness. Preventive health guidelines for adults include the following key practices.  . A routine yearly physical is a good [place] to check with your health care provider about your health and preventive screening. It is a chance to share any concerns and updates on your health and to receive a thorough exam.  . Visit your dentist for a routine exam and preventive care every 6 months. Brush your teeth twice a day and floss once a day. Good oral hygiene prevents tooth decay and gum disease.  . The frequency of eye exams is based on your age, health, family medical history, use  of contact lenses, and other factors. Follow your health care provider's recommendations for frequency of eye exams.  . Eat a healthy diet. Foods like vegetables, fruits, whole grains, low-fat dairy products, and lean protein foods contain the nutrients you  need without too many calories. Decrease your intake of foods high in solid fats, added sugars, and salt. Eat the right amount of calories for you. Get information about a proper diet from your health care provider, if necessary.  . Regular physical exercise is one of the most important things you can do for your health. Most adults should get at least 150 minutes of moderate-intensity exercise (any activity that increases your heart rate and causes you to sweat) each week. In addition, most adults need muscle-strengthening exercises on 2 or more days a week.  Silver Sneakers may be a benefit available to you. To determine eligibility, you may visit the website: www.silversneakers.com or contact program at 226-655-4867 Mon-Fri between 8AM-8PM.   . Maintain a healthy weight. The body mass index (BMI) is a screening tool to identify possible weight problems. It provides an estimate of body fat based on height and weight. Your health care provider can find your BMI and can help you achieve or maintain a healthy weight.   For adults 20 years and older: ? A BMI below 18.5 is considered underweight. ? A BMI of 18.5 to 24.9 is normal. ? A BMI of 25 to 29.9 is considered overweight. ? A BMI of 30 and above is considered obese.   . Maintain normal blood lipids and cholesterol levels by exercising and minimizing your intake  of saturated fat. Eat a balanced diet with plenty of fruit and vegetables. Blood tests for lipids and cholesterol should begin at age 68 and be repeated every 5 years. If your lipid or cholesterol levels are high, you are over 50, or you are at high risk for heart disease, you may need your cholesterol levels checked more frequently. Ongoing high lipid and cholesterol levels should be treated with medicines if diet and exercise are not working.  . If you smoke, find out from your health care provider how to quit. If you do not use tobacco, please do not start.  . If you choose to drink  alcohol, please do not consume more than 2 drinks per day. One drink is considered to be 12 ounces (355 mL) of beer, 5 ounces (148 mL) of wine, or 1.5 ounces (44 mL) of liquor.  . If you are 37-16 years old, ask your health care provider if you should take aspirin to prevent strokes.  . Use sunscreen. Apply sunscreen liberally and repeatedly throughout the day. You should seek shade when your shadow is shorter than you. Protect yourself by wearing long sleeves, pants, a wide-brimmed hat, and sunglasses year round, whenever you are outdoors.  . Once a month, do a whole body skin exam, using a mirror to look at the skin on your back. Tell your health care provider of new moles, moles that have irregular borders, moles that are larger than a pencil eraser, or moles that have changed in shape or color.

## 2018-02-06 NOTE — Progress Notes (Signed)
I reviewed health advisor's note, was available for consultation, and agree with documentation and plan.  

## 2018-02-07 ENCOUNTER — Other Ambulatory Visit: Payer: Self-pay | Admitting: Gastroenterology

## 2018-02-10 ENCOUNTER — Other Ambulatory Visit: Payer: Self-pay

## 2018-02-10 ENCOUNTER — Encounter: Payer: Self-pay | Admitting: Family Medicine

## 2018-02-10 ENCOUNTER — Ambulatory Visit (INDEPENDENT_AMBULATORY_CARE_PROVIDER_SITE_OTHER): Payer: PPO | Admitting: Family Medicine

## 2018-02-10 VITALS — BP 136/70 | HR 74 | Temp 98.5°F | Ht 67.0 in | Wt 219.2 lb

## 2018-02-10 DIAGNOSIS — Z0000 Encounter for general adult medical examination without abnormal findings: Secondary | ICD-10-CM

## 2018-02-10 MED ORDER — AMLODIPINE BESYLATE 5 MG PO TABS: 5.0000 mg | ORAL_TABLET | Freq: Every day | ORAL | 3 refills | Status: DC

## 2018-02-10 NOTE — Progress Notes (Signed)
Dr. Frederico Hamman T. Tamber Burtch, MD, [facility] [place], [address] Phone: [phone] Fax: [phone]  02/10/2018  Patient: [name], MRN: [id number], DOB: 1946/06/07, 72 y.o.  Primary Physician:  [name], MD   No chief complaint on file.  Subjective:   [name] is a 72 y.o. pleasant patient who presents with the following:  Preventative Health Maintenance Visit:  Health Maintenance Summary Reviewed and updated, unless pt declines services.  Tobacco History Reviewed. Alcohol: No concerns, no excessive use Exercise Habits: Some activity, rec at least 30 mins 5 times a week, rare in general.  STD concerns: no risk or activity to increase risk Drug Use: None Encouraged self-testicular check  Health Maintenance  Topic Date Due  . FOOT EXAM  02/10/2018 (Originally 07/21/2014)  . OPHTHALMOLOGY EXAM  02/27/2018  . HEMOGLOBIN A1C  08/04/2018  . URINE MICROALBUMIN  02/02/2019  . COLONOSCOPY  07/20/2022  . TETANUS/TDAP  06/30/2025  . INFLUENZA VACCINE  Completed  . Hepatitis C Screening  Completed  . PNA vac Low Risk Adult  Completed   Immunization History  Administered Date(s) Administered  . Influenza Split 10/04/2012  . Influenza Whole 08/16/2008, 08/01/2014  . Influenza, High Dose Seasonal PF 09/16/2017  . Influenza,inj,Quad PF,6+ Mos 10/11/2013, 10/18/2015, 08/24/2016  . Pneumococcal Conjugate-13 11/18/2015  . Pneumococcal Polysaccharide-23 06/13/2012  . Td 07/01/2015   Patient Active Problem List   Diagnosis Date Noted  . OSA (obstructive sleep apnea) 09/15/2017  . Coronary artery disease of native artery of native heart with stable angina pectoris ([place]) 08/18/2017  . Hemoptysis   . COPD (chronic obstructive pulmonary disease) ([place]) 05/14/2017  . Radial neck fracture 09/14/2014  . Fracture of femoral neck, left ([place]) 09/14/2014  . Centrilobular emphysema ([place]) 09/12/2014  . Aortic  transection   . Peripheral vascular disease ([place])   . History of MI (myocardial infarction)   . Duodenal ulcer with hemorrhage 04/30/2011  . DM (diabetes mellitus) type II, controlled, with peripheral vascular disorder ([place]) 02/13/2010  . Hyperlipidemia LDL goal <70 08/16/2008  . ERECTILE DYSFUNCTION 08/16/2008  . Major depressive disorder, recurrent, in remission ([place]) 08/16/2008  . Essential hypertension 08/16/2008  . Coronary artery disease involving native coronary artery without angina pectoris 08/16/2008   Past Medical History:  Diagnosis Date  . Aortic transection 2005   Traumatic after a fall from a second floor. s/p repair at [facility]  . Basal cell carcinoma   . CAD (coronary artery disease) 1992   a. 1992 Acute anterior MI, thrombolytic therapy-->cath reportedly w/o significant CAD; b. 08/2016 MV: EF 57%, hypertensive respons, no ischemia/infarct; c. 04/2017 CTA chest w/ coronary Ca2+.  . Depression   . Diastolic dysfunction    a. 04/2017 Echo: EF 60-65%, Gr1 DD, nl RV fxn.  . Erectile dysfunction   . Fall 2005   fell off house: torn aorta, clavicle fracture, rib fracture, vertebral fractures, lung contusion, coma x 2 weeks  . Fracture of femoral neck, left ([place]) 08/09/2014   ORIF, Dr. Marry Guan  . Fracture of radial neck, left, closed 08/09/2014  . Gallstone pancreatitis   . Gastric ulcer   . History of ATN 08/09/2014   [facility], 2 days of dialysis (ARF)  . Hyperlipidemia    Statin with joint pain   . Hypertension   . Obesity   . Peripheral vascular disease ([place])    Atherosclerotic:R renal artery stenosis  . Type II diabetes mellitus ([place])   .  Upper GI bleed    a. 04/2017 admit w/ melena and HGB down to 7 req prbc's; b. 04/2017 EGD: small HH, non-bleeding gastric & duod ulcers, non-bleeding erosive gastropathy-->PPI Rx.   Past Surgical History:  Procedure Laterality Date  . CARDIAC CATHETERIZATION  08/1991   50 % mid-Lad stenosis with clot, 25-505 second marginal  .  CARDIOVASCULAR SURGERY     with ruptured Aorta, Dr. Camila Li, [facility]   . CHOLECYSTECTOMY    . ESOPHAGOGASTRODUODENOSCOPY (EGD) WITH PROPOFOL N/A 05/03/2017   Procedure: ESOPHAGOGASTRODUODENOSCOPY (EGD) WITH PROPOFOL;  Surgeon: Lucilla Lame, MD;  Location[facility]COPY;  Service: Endoscopy;  Laterality: N/A;  . KNEE SURGERY     Right   . ORIF HIP FRACTURE Left 08/12/2014   Dr. Marry Guan  . THORACOTOMY     thoracic aorta repair   . TRACHEOSTOMY     s/p reversal  . VIDEO BRONCHOSCOPY Bilateral 05/27/2017   Procedure: VIDEO BRONCHOSCOPY WITHOUT FLUORO;  Surgeon: Juanito Doom, MD;  Location: [facility] ENDOSCOPY;  Service: Cardiopulmonary;  Laterality: Bilateral;   Social History   Socioeconomic History  . Marital status: Married    Spouse name: Not on file  . Number of children: Not on file  . Years of education: Not on file  . Highest education level: Not on file  Social Needs  . Financial resource strain: Not on file  . Food insecurity - worry: Not on file  . Food insecurity - inability: Not on file  . Transportation needs - medical: Not on file  . Transportation needs - non-medical: Not on file  Occupational History  . 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 Smoking status: Former Smoker    Packs/day: 1.50    Years: 20.00    Pack years: 30.00    Types: Cigarettes    Last attempt to quit: 09/13/1991    Years since quitting: 26.4  . Smokeless tobacco: Never Used  . Tobacco comment: quit post MI  Substance and Sexual Activity  . Alcohol use: No  . Drug use: No  . Sexual activity: Not on file  Other Topics Concern  . Not on file  Social History Narrative   No regular exercise    Family History  Problem Relation Age of Onset  . Dementia Mother 74  . Heart attack Father 60  . Diabetes Brother 42  . Heart attack Brother   . Diabetes Brother   . Colon cancer Neg Hx   . Stomach cancer Neg Hx   . Esophageal cancer Neg Hx   . Rectal  cancer Neg Hx    Allergies  Allergen Reactions  . Contrast Media [Iodinated Diagnostic Agents]     Severe hives  . Xarelto [Rivaroxaban] Other (See Comments)    Reaction: GI Bleed  . Clindamycin/Lincomycin Rash  . Dye Fdc Red [Red Dye] Rash  . Lovenox [Enoxaparin Sodium]     Rash, blackness at injection site. Multifactorial and multiple other drugs used at the same time.  [name] Kitchen Penicillins Rash and Other (See Comments)    Has patient had a PCN reaction causing immediate rash, facial/tongue/throat swelling, SOB or lightheadedness with hypotension: Unknown Has patient had a PCN reaction causing severe rash involving mucus membranes or skin necrosis: Unknown Has patient had a PCN reaction that required hospitalization: Unknown Has patient had a PCN reaction occurring within the last 10 years: No If all of the above answers are "NO", then may proceed with Cephalosporin  use.     Medication list has been reviewed and updated.   General: Denies fever, chills, sweats. No significant weight loss. Eyes: Denies blurring,significant itching ENT: Denies earache, sore throat, and hoarseness. Cardiovascular: Denies chest pains, palpitations, dyspnea on exertion Respiratory: Denies cough, dyspnea at rest,wheeezing Breast: no concerns about lumps GI: Denies nausea, vomiting, diarrhea, constipation, change in bowel habits, abdominal pain, melena, hematochezia GU: Denies penile discharge, ED, urinary flow / outflow problems. No STD concerns. Musculoskeletal: Denies back pain, joint pain Derm: Denies rash, itching Neuro: Denies  paresthesias, frequent falls, frequent headaches Psych: Denies depression, anxiety Endocrine: Denies cold intolerance, heat intolerance, polydipsia Heme: Denies enlarged lymph nodes Allergy: No hayfever  Objective:   BP 136/70   Pulse 74   Temp 98.5 F (36.9 C) (Oral)   Ht 5' 7"  (1.702 m)   Wt 219 lb 4 oz (99.5 kg)   BMI 34.34 kg/m  Ideal Body Weight: Weight in (lb)  to have BMI = 25: 159.3  No exam data present  GEN: well developed, well nourished, no acute distress Eyes: conjunctiva and lids normal, PERRLA, EOMI ENT: TM clear, nares clear, oral exam WNL Neck: supple, no lymphadenopathy, no thyromegaly, no JVD Pulm: clear to auscultation and percussion, respiratory effort normal CV: regular rate and rhythm, S1-S2, no murmur, rub or gallop, no bruits, peripheral pulses normal and symmetric, no cyanosis, clubbing, edema or varicosities GI: soft, non-tender; no hepatosplenomegaly, masses; active bowel sounds all quadrants GU: no hernia, testicular mass, penile discharge Lymph: no cervical, axillary or inguinal adenopathy MSK: gait normal, muscle tone and strength WNL, no joint swelling, effusions, discoloration, crepitus  SKIN: clear, good turgor, color WNL, no rashes, lesions, or ulcerations Neuro: normal mental status, normal strength, sensation, and motion Psych: alert; oriented to person, place and time, normally interactive and not anxious or depressed in appearance. All labs reviewed with patient.  Lipids:    Component Value Date/Time   CHOL 120 02/01/2018 1034   CHOL 120 08/10/2014 2128   TRIG 96.0 02/01/2018 1034   TRIG 164 08/10/2014 2128   HDL 38.20 (L) 02/01/2018 1034   HDL 29 (L) 08/10/2014 2128   LDLDIRECT 173.5 05/23/2012 1215   VLDL 19.2 02/01/2018 1034   VLDL 33 08/10/2014 2128   CHOLHDL 3 02/01/2018 1034   CBC: CBC Latest Ref Rng & Units 02/01/2018 05/20/2017 05/16/2017  WBC 4.0 - 10.5 K/uL 9.3 7.1 18.8(H)  Hemoglobin 13.0 - 17.0 g/dL 15.7 11.6(L) 11.6(L)  Hematocrit 39.0 - 52.0 % 44.0 33.8(L) 33.4(L)  Platelets 150.0 - 400.0 K/uL 182.0 222.0 Repeated and verified X2. 196    Basic Metabolic Panel:    Component Value Date/Time   NA 139 02/01/2018 1034   NA 140 08/23/2014 0619   K 4.7 02/01/2018 1034   K 4.7 08/23/2014 0619   CL 103 02/01/2018 1034   CL 109 (H) 08/23/2014 0619   CO2 28 02/01/2018 1034   CO2 25  08/23/2014 0619   BUN 15 02/01/2018 1034   BUN 14 08/23/2014 0619   CREATININE 1.14 02/01/2018 1034   CREATININE 0.99 08/23/2014 0619   GLUCOSE 119 (H) 02/01/2018 1034   GLUCOSE 129 (H) 08/23/2014 0619   CALCIUM 10.1 02/01/2018 1034   CALCIUM 8.2 (L) 08/23/2014 0619   Hepatic Function Latest Ref Rng & Units 02/01/2018 05/14/2017 05/04/2017  Total Protein 6.0 - 8.3 g/dL 7.2 6.8 5.8(L)  Albumin 3.5 - 5.2 g/dL 4.8 4.1 3.6  AST 0 - 37 U/L 11 25 28   ALT 0 -  53 U/L 12 27 21   Alk Phosphatase 39 - 117 U/L 121(H) 75 59  Total Bilirubin 0.2 - 1.2 mg/dL 1.2 1.3(H) 1.1  Bilirubin, Direct 0.0 - 0.3 mg/dL 0.3 - -    Lab Results  Component Value Date   TSH 1.22 08/24/2016   Lab Results  Component Value Date   PSA 1.77 02/01/2018   PSA 1.81 01/27/2017   PSA 1.64 11/11/2015    Assessment and Plan:   Healthcare maintenance  Health Maintenance Exam: The patient's preventative maintenance and recommended screening tests for an annual wellness exam were reviewed in full today. Brought up to date unless services declined.  Counselled on the importance of diet, exercise, and its role in overall health and mortality. The patient's FH and SH was reviewed, including their home life, tobacco status, and drug and alcohol status.  Follow-up in 1 year for physical exam or additional follow-up below.  Follow-up: No Follow-up on file. Or follow-up in 1 year if not noted.  Meds ordered this encounter  Medications  . amLODipine (NORVASC) 5 MG tablet    Sig: Take 1 tablet (5 mg total) by mouth daily.    Dispense:  90 tablet    Refill:  3   Medications Discontinued During This Encounter  Medication Reason  . amLODipine (NORVASC) 5 MG tablet Reorder   Signed,  Eldoris Beiser T. Erminio Nygard, MD   Allergies as of 02/10/2018      Reactions   Contrast Media [iodinated Diagnostic Agents]    Severe hives   Xarelto [rivaroxaban] Other (See Comments)   Reaction: GI Bleed   Clindamycin/lincomycin Rash   Dye  Fdc Red [red Dye] Rash   Lovenox [enoxaparin Sodium]    Rash, blackness at injection site. Multifactorial and multiple other drugs used at the same time.   Penicillins Rash, Other (See Comments)   Has patient had a PCN reaction causing immediate rash, facial/tongue/throat swelling, SOB or lightheadedness with hypotension: Unknown Has patient had a PCN reaction causing severe rash involving mucus membranes or skin necrosis: Unknown Has patient had a PCN reaction that required hospitalization: Unknown Has patient had a PCN reaction occurring within the last 10 years: No If all of the above answers are "NO", then may proceed with Cephalosporin use.      Medication List        Accurate as of 02/10/18  2:54 PM. Always use your most recent med list.          amLODipine 5 MG tablet Commonly known as:  NORVASC Take 1 tablet (5 mg total) by mouth daily.   aspirin 81 MG chewable tablet Chew 81 mg by mouth daily.   atorvastatin 40 MG tablet Commonly known as:  LIPITOR TAKE 1 TABLET BY MOUTH ONCE DAILY   busPIRone 5 MG tablet Commonly known as:  BUSPAR TAKE ONE TABLET 3 TIMES DAILY   citalopram 10 MG tablet Commonly known as:  CELEXA TAKE ONE TABLET BY MOUTH EVERY DAY   FEROSUL 325 (65 FE) MG tablet Generic drug:  ferrous sulfate TAKE ONE TABLET EVERY DAY   Glycopyrrolate-Formoterol 9-4.8 MCG/ACT Aero Commonly known as:  BEVESPI AEROSPHERE Inhale 2 puffs into the lungs 2 (two) times daily.   metFORMIN 500 MG 24 hr tablet Commonly known as:  GLUCOPHAGE-XR TAKE ONE TABLET BY MOUTH EVERY DAY   nitroGLYCERIN 0.4 MG SL tablet Commonly known as:  NITROSTAT Place 1 tablet (0.4 mg total) under the tongue every 5 (five) minutes as needed for chest pain.  pantoprazole 40 MG tablet Commonly known as:  PROTONIX TAKE ONE TABLET BY MOUTH EVERY DAY

## 2018-03-14 ENCOUNTER — Other Ambulatory Visit: Payer: Self-pay | Admitting: Pulmonary Disease

## 2018-03-24 DIAGNOSIS — G4733 Obstructive sleep apnea (adult) (pediatric): Secondary | ICD-10-CM | POA: Diagnosis not present

## 2018-03-24 DIAGNOSIS — J441 Chronic obstructive pulmonary disease with (acute) exacerbation: Secondary | ICD-10-CM | POA: Diagnosis not present

## 2018-04-06 ENCOUNTER — Other Ambulatory Visit: Payer: Self-pay | Admitting: Family Medicine

## 2018-04-22 DIAGNOSIS — G4733 Obstructive sleep apnea (adult) (pediatric): Secondary | ICD-10-CM | POA: Diagnosis not present

## 2018-04-22 DIAGNOSIS — J441 Chronic obstructive pulmonary disease with (acute) exacerbation: Secondary | ICD-10-CM | POA: Diagnosis not present

## 2018-04-23 DIAGNOSIS — G4733 Obstructive sleep apnea (adult) (pediatric): Secondary | ICD-10-CM | POA: Diagnosis not present

## 2018-04-23 DIAGNOSIS — J441 Chronic obstructive pulmonary disease with (acute) exacerbation: Secondary | ICD-10-CM | POA: Diagnosis not present

## 2018-04-26 ENCOUNTER — Other Ambulatory Visit: Payer: Self-pay | Admitting: Family Medicine

## 2018-05-24 DIAGNOSIS — G4733 Obstructive sleep apnea (adult) (pediatric): Secondary | ICD-10-CM | POA: Diagnosis not present

## 2018-05-24 DIAGNOSIS — J441 Chronic obstructive pulmonary disease with (acute) exacerbation: Secondary | ICD-10-CM | POA: Diagnosis not present

## 2018-05-25 ENCOUNTER — Other Ambulatory Visit: Payer: Self-pay | Admitting: Family Medicine

## 2018-06-23 DIAGNOSIS — G4733 Obstructive sleep apnea (adult) (pediatric): Secondary | ICD-10-CM | POA: Diagnosis not present

## 2018-06-23 DIAGNOSIS — J441 Chronic obstructive pulmonary disease with (acute) exacerbation: Secondary | ICD-10-CM | POA: Diagnosis not present

## 2018-07-24 DIAGNOSIS — J441 Chronic obstructive pulmonary disease with (acute) exacerbation: Secondary | ICD-10-CM | POA: Diagnosis not present

## 2018-07-24 DIAGNOSIS — G4733 Obstructive sleep apnea (adult) (pediatric): Secondary | ICD-10-CM | POA: Diagnosis not present

## 2018-07-25 ENCOUNTER — Other Ambulatory Visit: Payer: Self-pay | Admitting: Family Medicine

## 2018-08-24 DIAGNOSIS — J441 Chronic obstructive pulmonary disease with (acute) exacerbation: Secondary | ICD-10-CM | POA: Diagnosis not present

## 2018-08-24 DIAGNOSIS — G4733 Obstructive sleep apnea (adult) (pediatric): Secondary | ICD-10-CM | POA: Diagnosis not present

## 2018-09-05 ENCOUNTER — Other Ambulatory Visit: Payer: Self-pay | Admitting: Gastroenterology

## 2018-09-23 DIAGNOSIS — J441 Chronic obstructive pulmonary disease with (acute) exacerbation: Secondary | ICD-10-CM | POA: Diagnosis not present

## 2018-09-23 DIAGNOSIS — G4733 Obstructive sleep apnea (adult) (pediatric): Secondary | ICD-10-CM | POA: Diagnosis not present

## 2018-09-29 ENCOUNTER — Ambulatory Visit (INDEPENDENT_AMBULATORY_CARE_PROVIDER_SITE_OTHER): Payer: PPO

## 2018-09-29 DIAGNOSIS — Z23 Encounter for immunization: Secondary | ICD-10-CM

## 2018-10-03 ENCOUNTER — Other Ambulatory Visit: Payer: Self-pay | Admitting: Family Medicine

## 2018-10-24 ENCOUNTER — Other Ambulatory Visit: Payer: Self-pay | Admitting: Family Medicine

## 2018-10-25 IMAGING — CR DG CHEST 2V
1 series · 2 of 2 positions shown · non-contrast
Comparison: No prior .

CLINICAL DATA: Abdominal pain .

EXAM:
CHEST  2 VIEW

[Series 1: dg chest 2 view · 0.14mm/px · 2 of 2 slices shown]
[im 1/2]
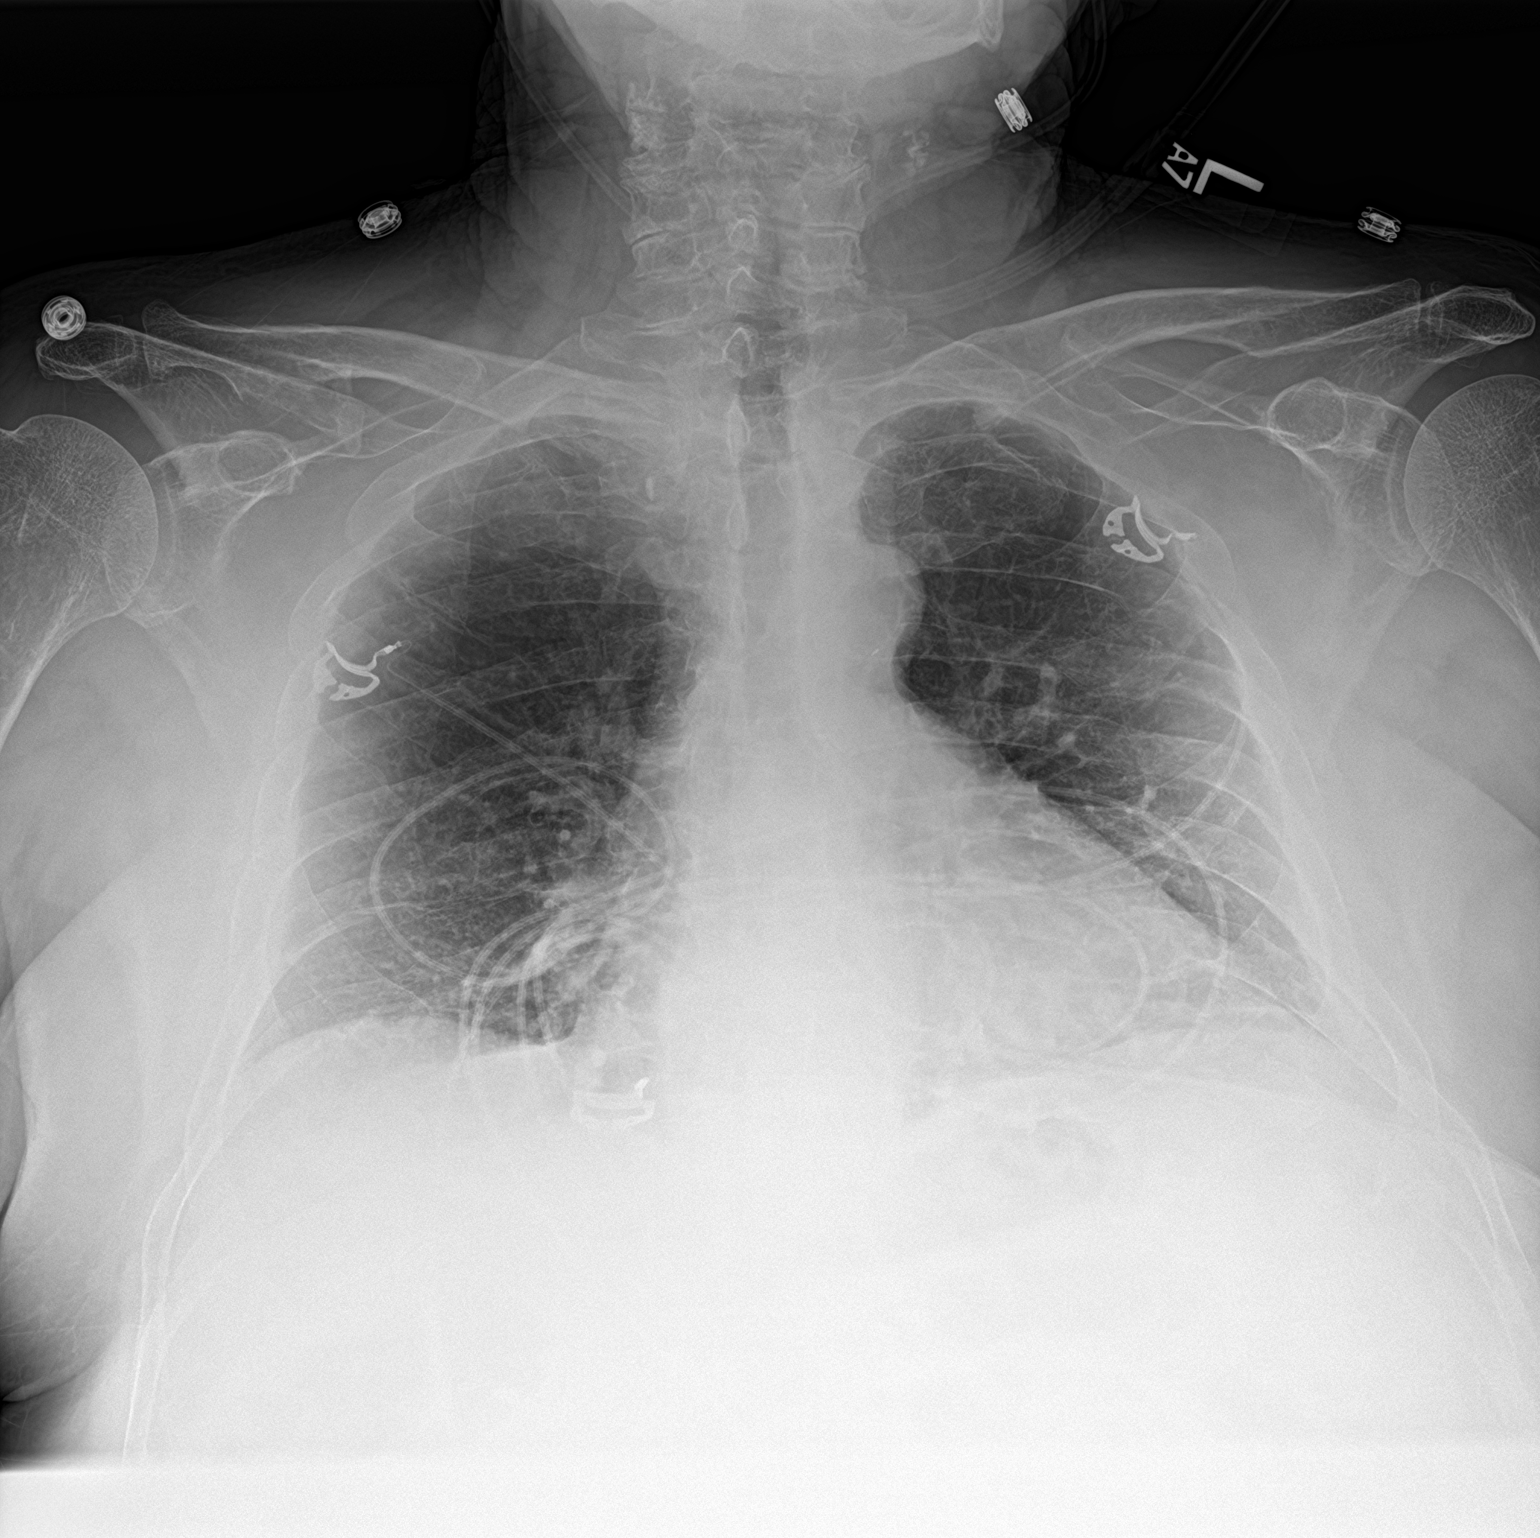
[im 2/2]
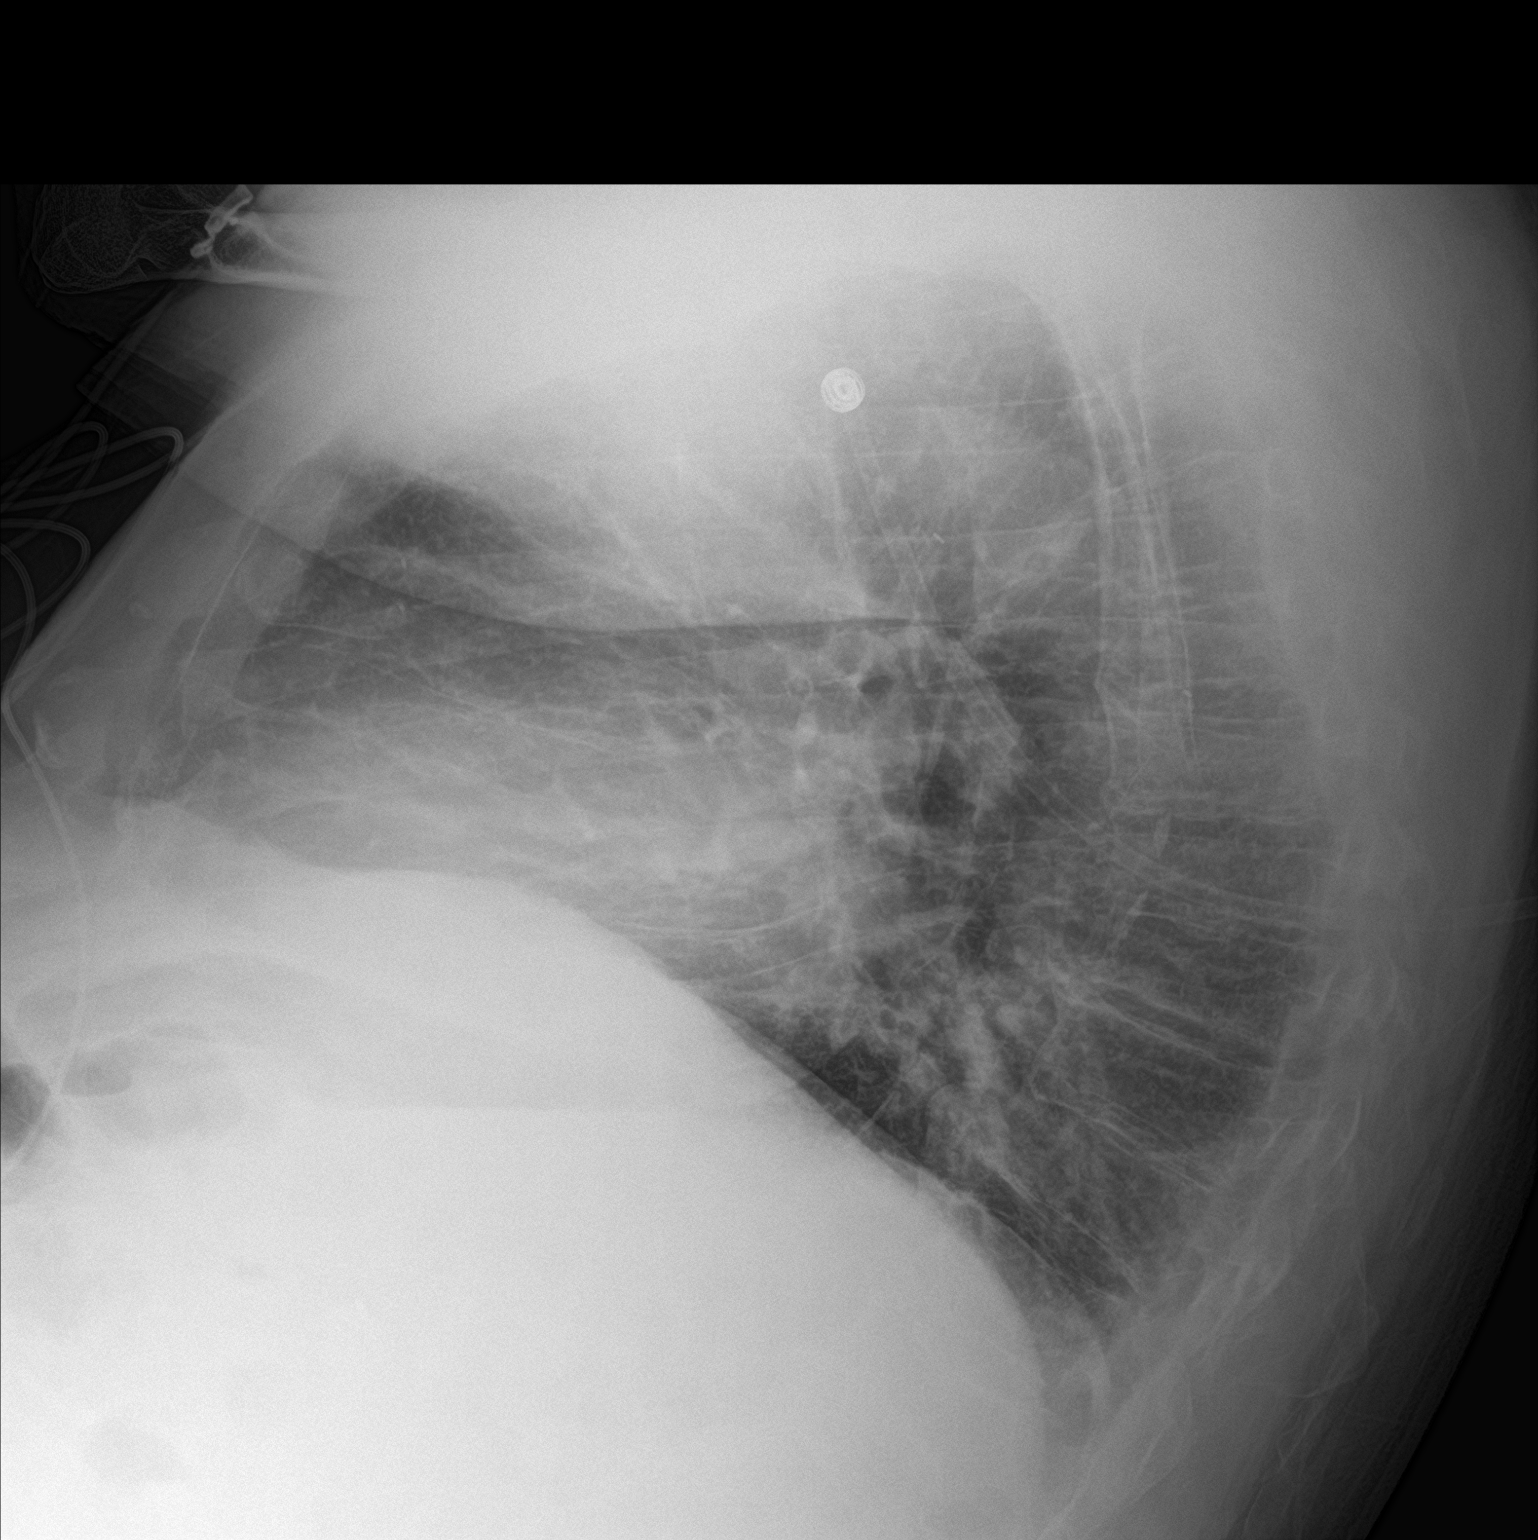

[2 of 2 positions shown; findings below may reference images not displayed]

FINDINGS: Mediastinum hilar structures normal. Cardiomegaly with normal
pulmonary vascularity. Low lung volumes with mild basilar
atelectasis. No pleural effusion or pneumothorax. Mild basal
pleural-parenchymal thickening consistent with scarring.
IMPRESSION: Low lung volumes with mild basilar atelectasis. Mild basal
pleural-parenchymal thickening consistent with scarring. No acute
pulmonary disease.

## 2018-10-28 IMAGING — CR DG CHEST 2V
1 series · 2 of 2 positions shown · non-contrast
Comparison: Chest radiograph May 04, 2017; chest CT February 19, 2017

CLINICAL DATA: Recent hemoptysis

EXAM:
CHEST  2 VIEW

[Series 1: w chest pa · 0.14mm/px · 2 of 2 slices shown]
[im 1/2]
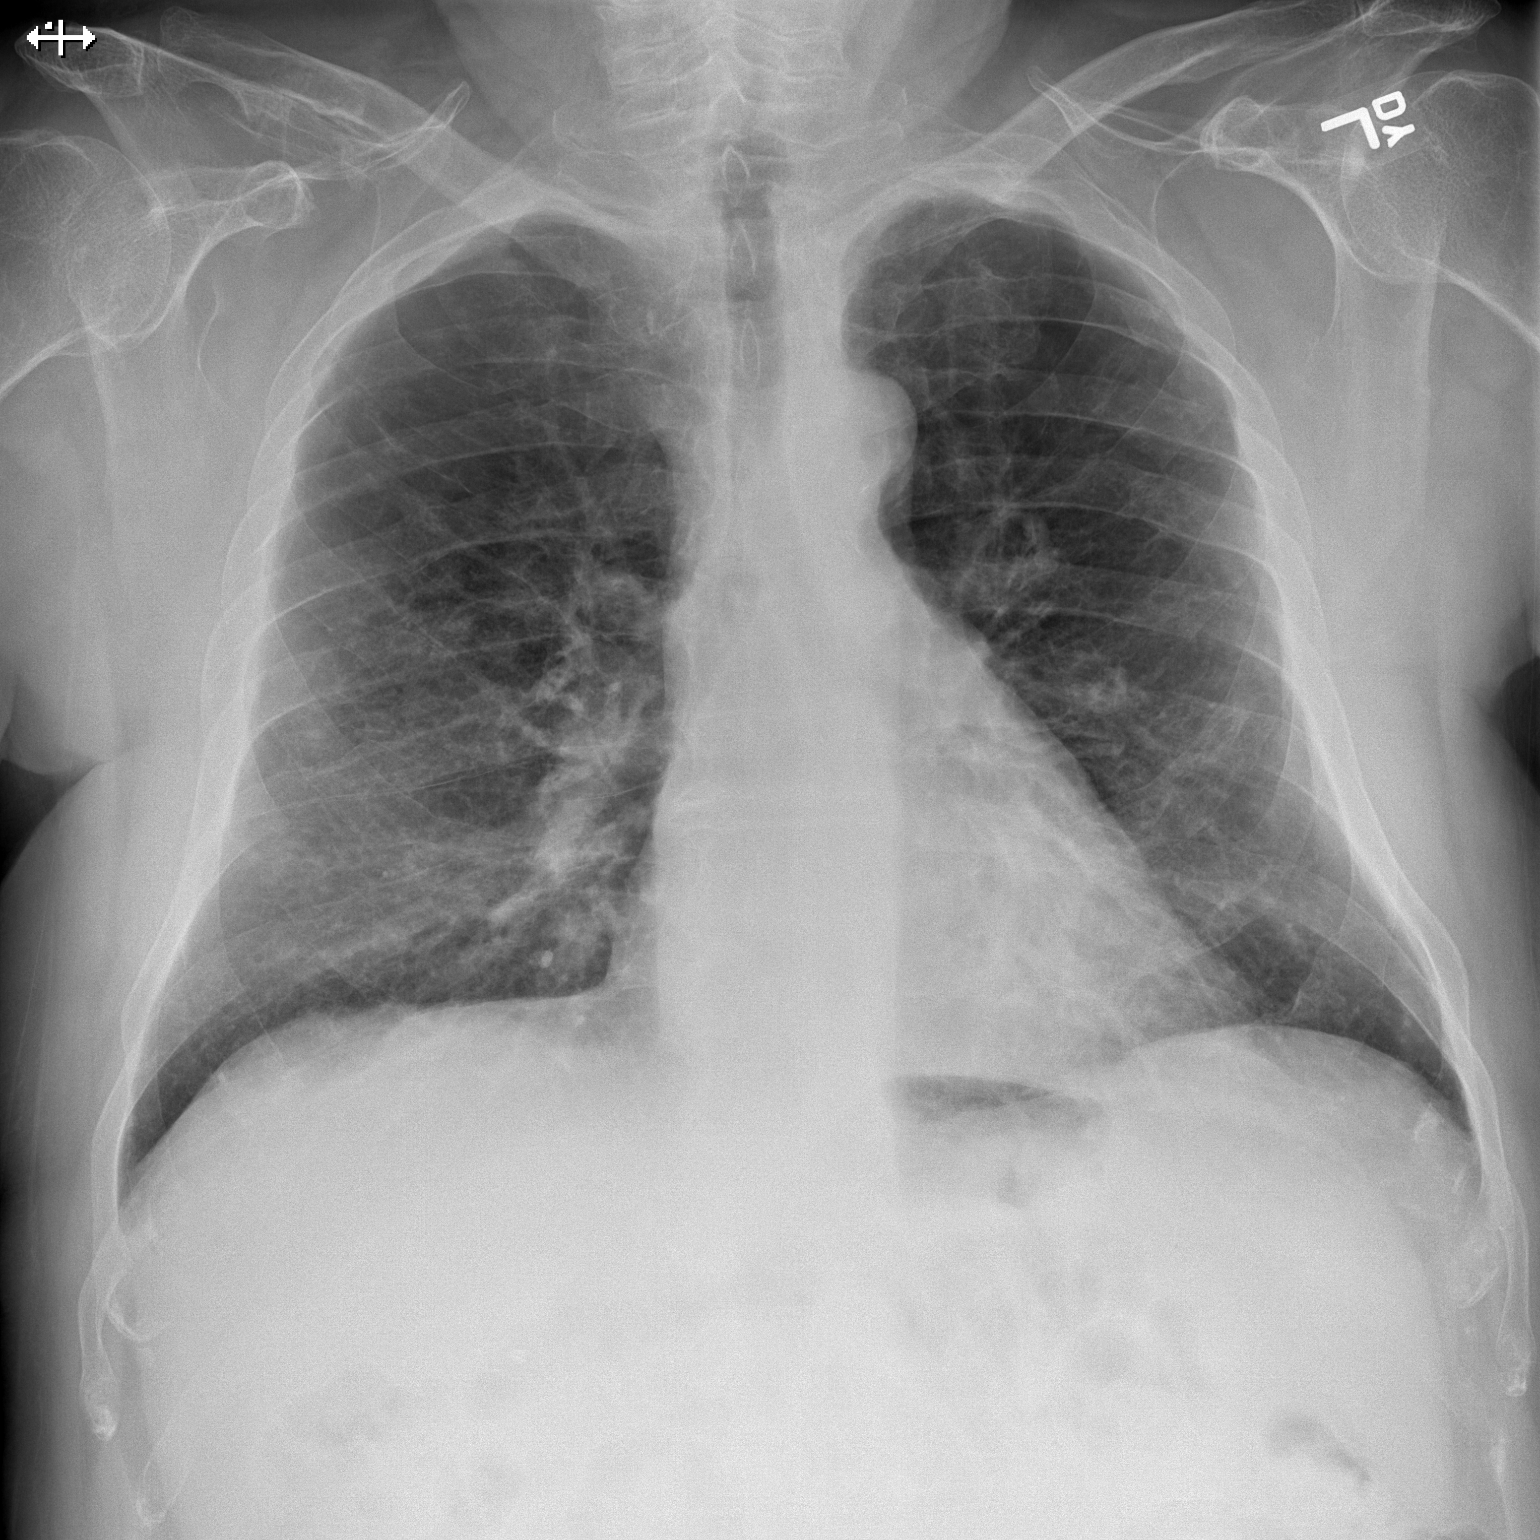
[im 2/2]
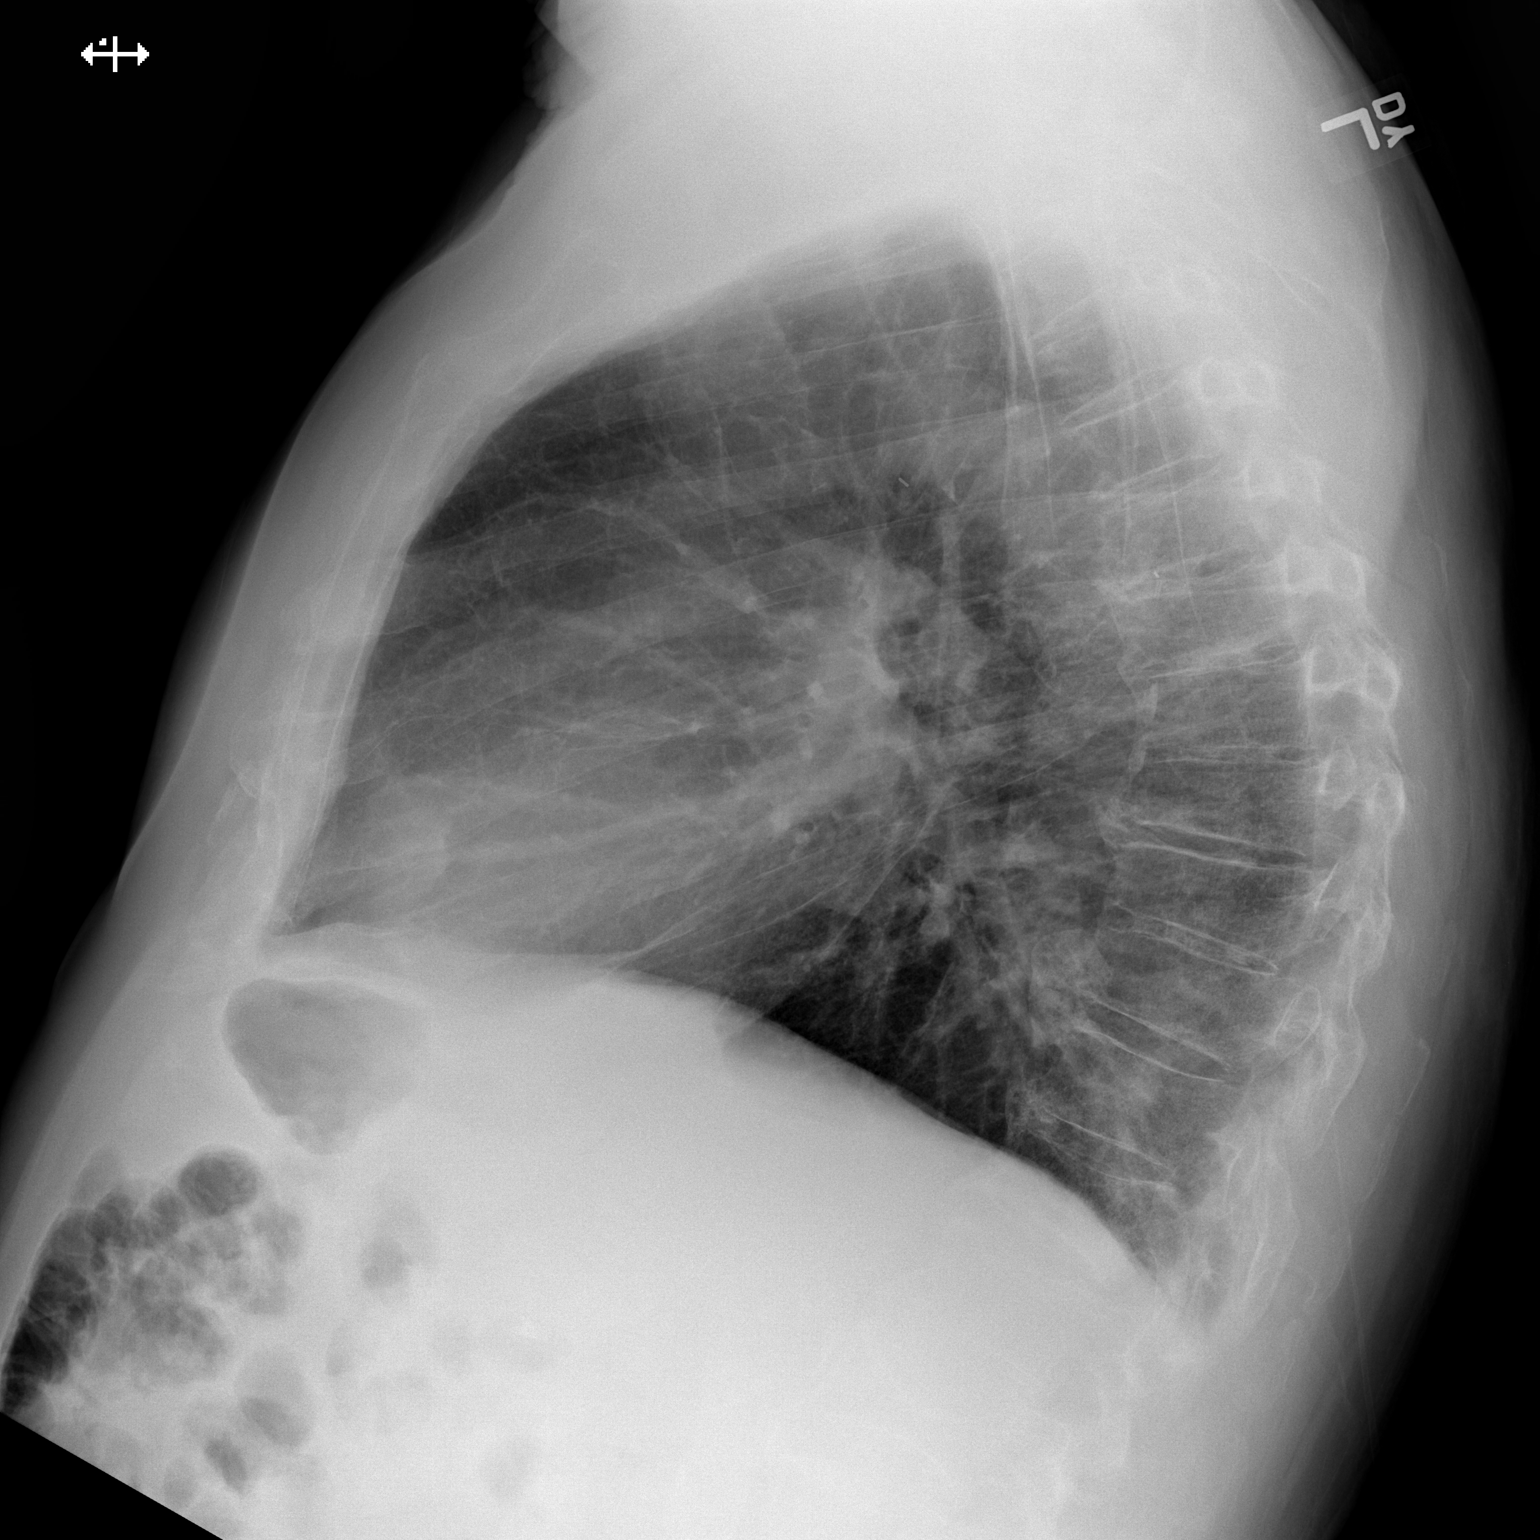

[2 of 2 positions shown; findings below may reference images not displayed]

FINDINGS: There is underlying bullous emphysematous change. There is no edema
or consolidation. There is slight scarring in the left mid lung.
Heart size and pulmonary vascularity are normal. No adenopathy.
There is multifocal degenerative change in the thoracic spine.
IMPRESSION: Underlying emphysematous change. Mild scarring left mid lung. No
edema or consolidation. Stable cardiac silhouette.

## 2018-11-04 IMAGING — CT CT ANGIO CHEST
2 of 6 series · 19 of 46 positions shown · IV contrast (APPLIED)
Comparison: Chest radiography 05/07/2017.  CT 02/19/2017.

CLINICAL DATA: Poor oxygen saturation. Worsening shortness of
breath.

EXAM:
CT ANGIOGRAPHY CHEST WITH CONTRAST
TECHNIQUE: Multidetector CT imaging of the chest was performed using the
standard protocol during bolus administration of intravenous
contrast. Multiplanar CT image reconstructions and MIPs were
obtained to evaluate the vascular anatomy.
CONTRAST:  75 cc Isovue 370

[Series 6: thins · axial · 0.84mm/px · z∈[-725,-469]mm · 17 of 282 slices shown]
[im 13/282  lung]
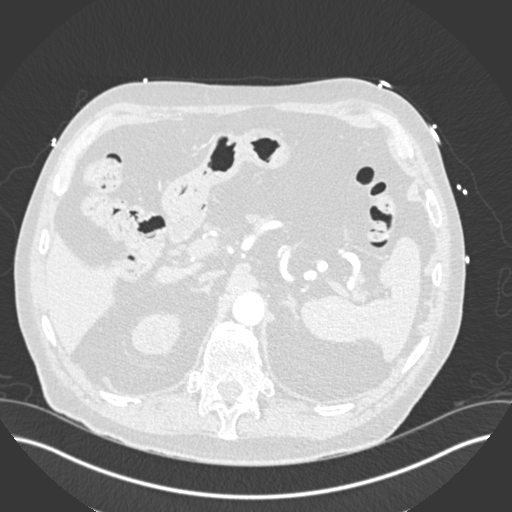
[im 25/282  soft-tissue]
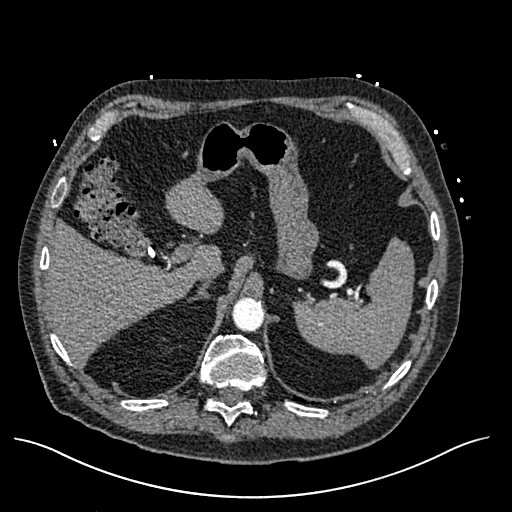
[im 49/282  lung]
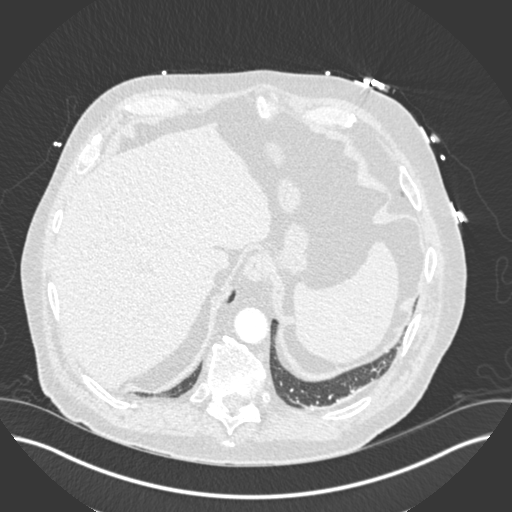
[im 62/282  soft-tissue]
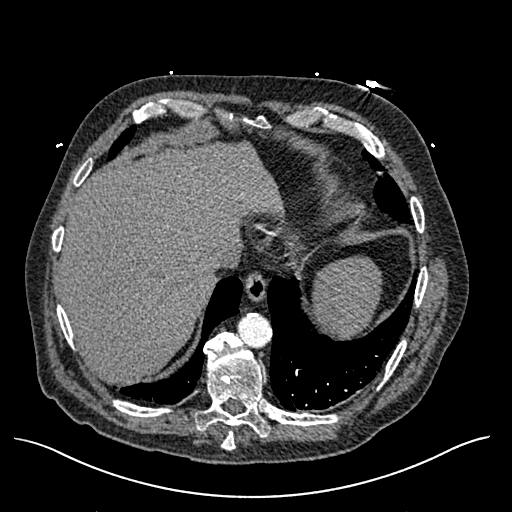
[im 74/282  lung]
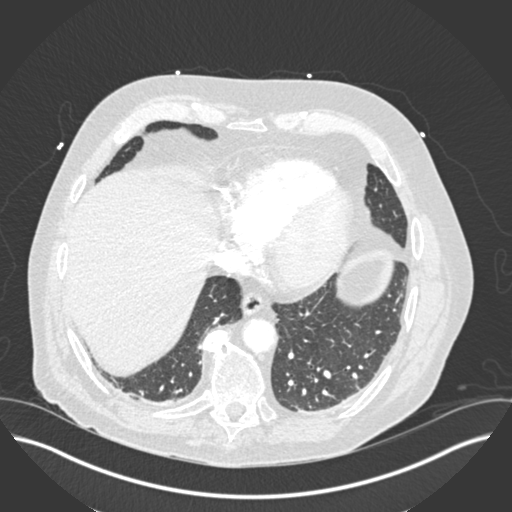
[im 98/282  soft-tissue]
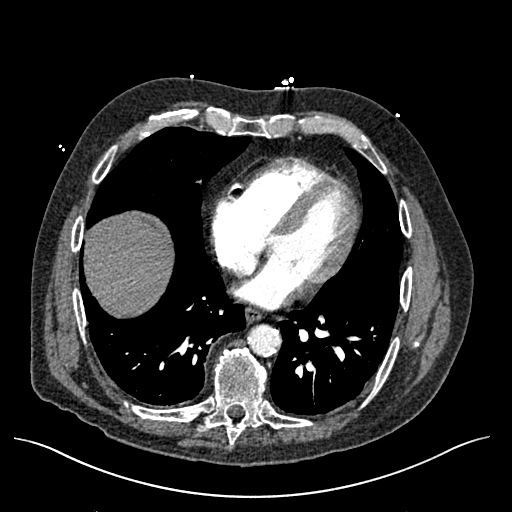
[im 110/282  lung]
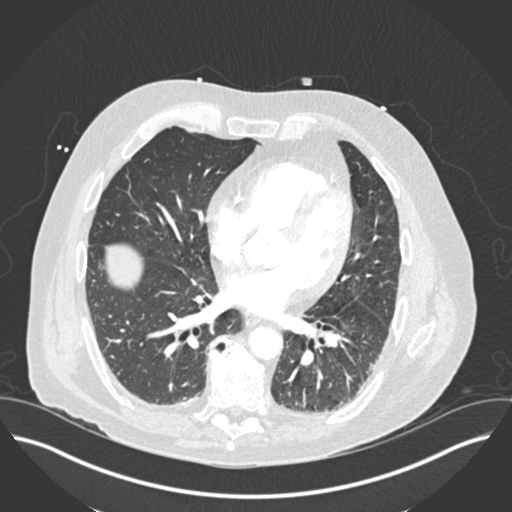
[im 123/282  soft-tissue]
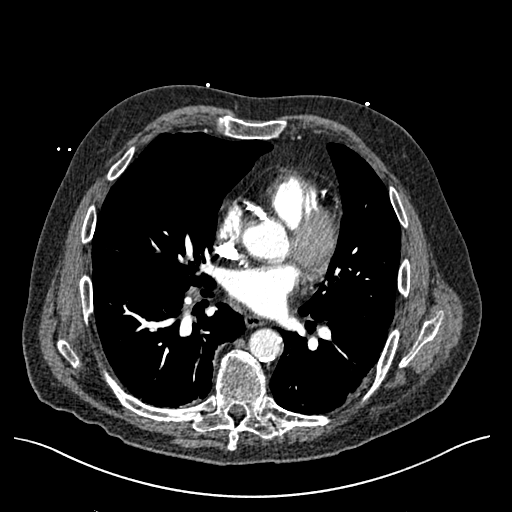
[im 147/282  lung]
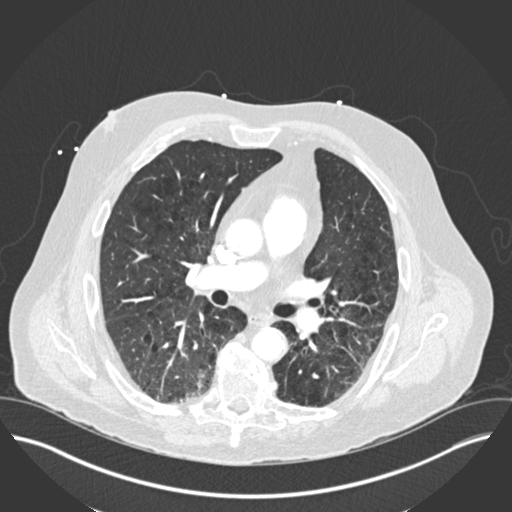
[im 159/282  soft-tissue]
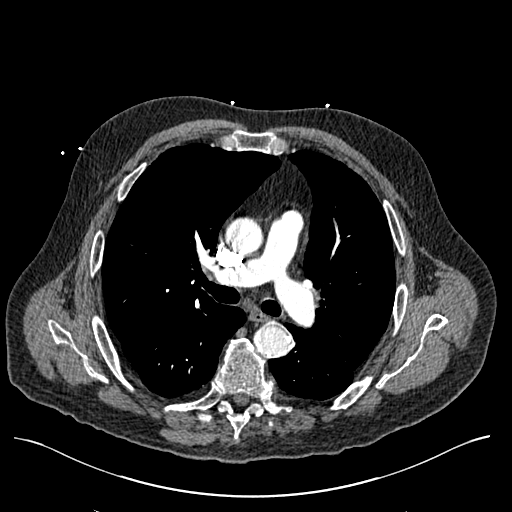
[im 172/282  lung]
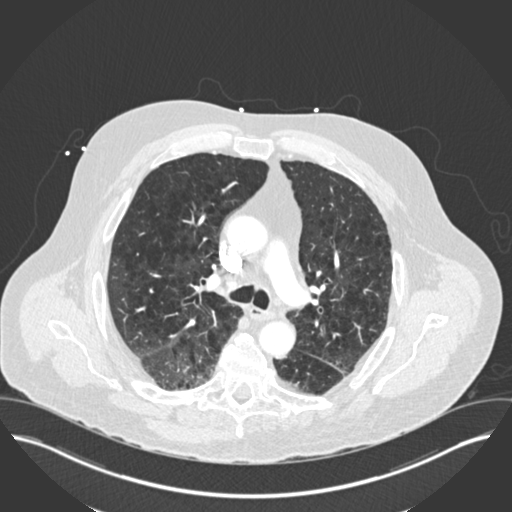
[im 184/282  soft-tissue]
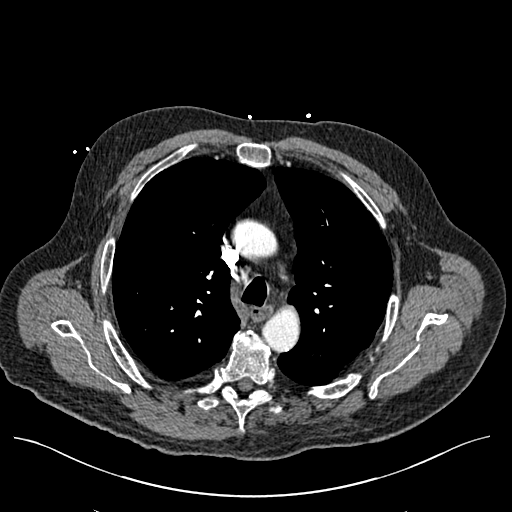
[im 208/282  lung]
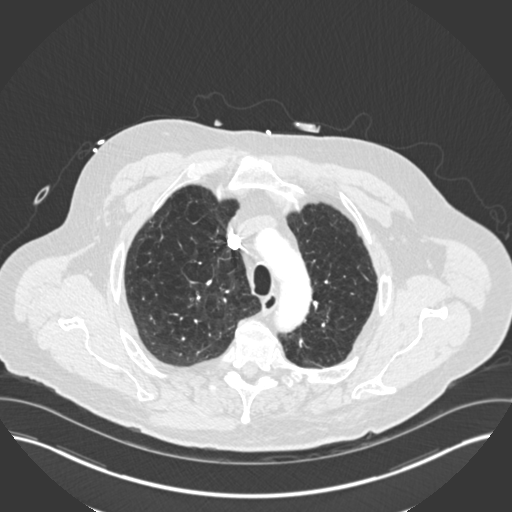
[im 220/282  soft-tissue]
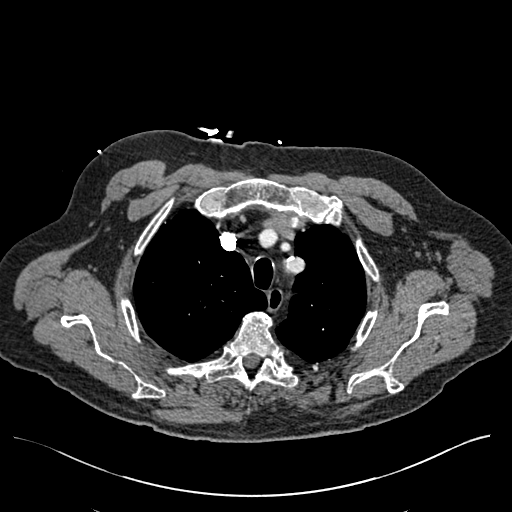
[im 233/282  lung]
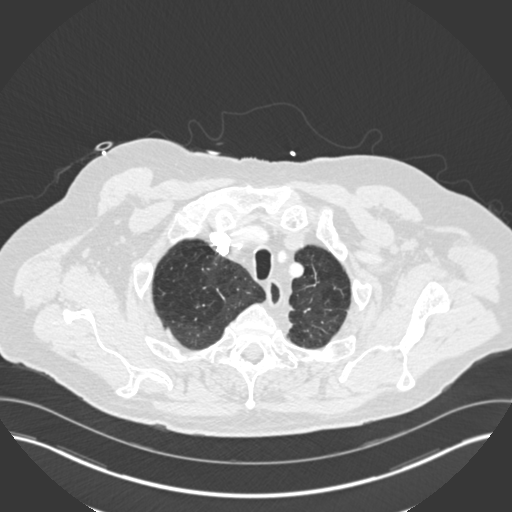
[im 257/282  soft-tissue]
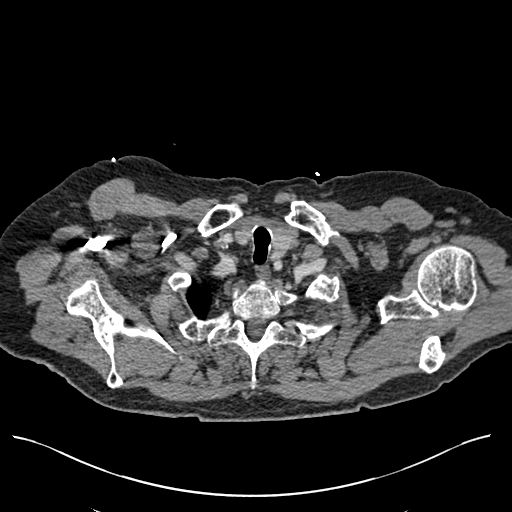
[im 269/282  lung]
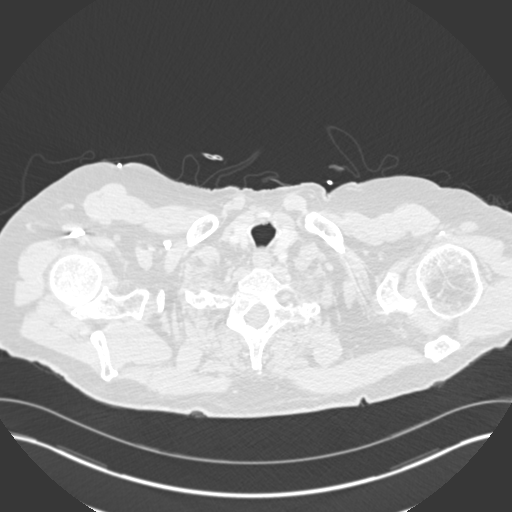

[Series 8: coronal mpr · coronal · 0.57mm/px · 2 of 115 slices shown]
[im 39/115  soft-tissue]
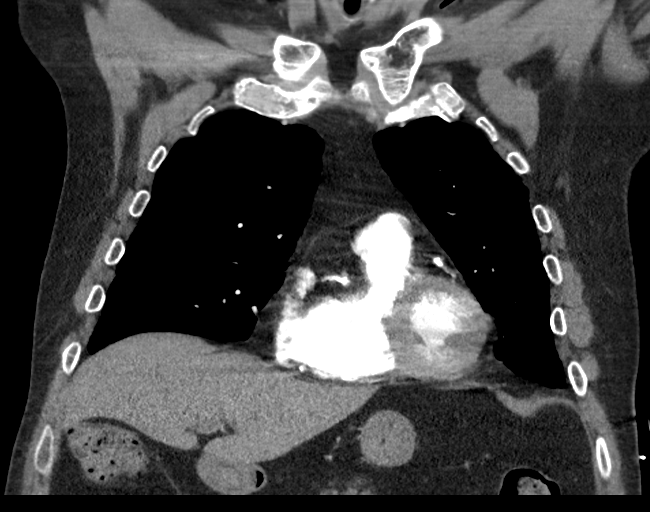
[im 77/115  soft-tissue]
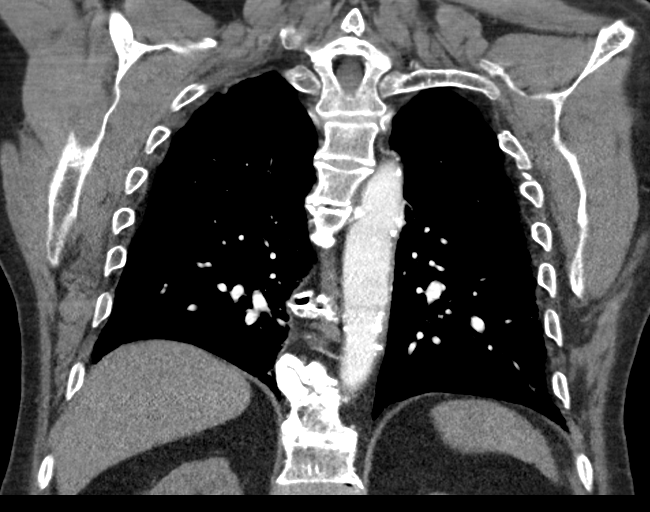

[19 of 46 positions shown; findings below may reference images not displayed]

FINDINGS: Cardiovascular: Pulmonary arterial opacification is excellent. No
pulmonary emboli. Heart size is normal. No pericardial fluid.
Extensive coronary artery calcification. Aortic atherosclerosis
without aneurysm or dissection.

Mediastinum/Nodes: No mass or lymphadenopathy.

Lungs/Pleura: Advanced emphysema, upper lobe predominant. No sign of
infiltrate, mass, effusion or collapse.

Upper Abdomen: No acute or significant finding. Previous
cholecystectomy.

Musculoskeletal: Chronic thoracic degenerative changes.

Review of the MIP images confirms the above findings.
IMPRESSION: No pulmonary emboli or acute chest pathology by CT.

Advanced emphysema.

Advanced coronary artery calcification.

Aortic Atherosclerosis (22JH1-Y08.8) and Emphysema (22JH1-312.I).

## 2018-12-23 ENCOUNTER — Other Ambulatory Visit: Payer: Self-pay | Admitting: Family Medicine

## 2019-02-03 ENCOUNTER — Other Ambulatory Visit: Payer: Self-pay | Admitting: Family Medicine

## 2019-02-07 ENCOUNTER — Other Ambulatory Visit: Payer: Self-pay | Admitting: Family Medicine

## 2019-02-07 DIAGNOSIS — Z79899 Other long term (current) drug therapy: Secondary | ICD-10-CM

## 2019-02-07 DIAGNOSIS — Z125 Encounter for screening for malignant neoplasm of prostate: Secondary | ICD-10-CM

## 2019-02-07 DIAGNOSIS — E1151 Type 2 diabetes mellitus with diabetic peripheral angiopathy without gangrene: Secondary | ICD-10-CM

## 2019-02-07 DIAGNOSIS — E785 Hyperlipidemia, unspecified: Secondary | ICD-10-CM

## 2019-02-08 ENCOUNTER — Other Ambulatory Visit: Payer: Self-pay

## 2019-02-08 ENCOUNTER — Ambulatory Visit (INDEPENDENT_AMBULATORY_CARE_PROVIDER_SITE_OTHER): Payer: PPO

## 2019-02-08 VITALS — BP 160/80 | HR 70 | Temp 98.5°F | Ht 67.5 in | Wt 221.7 lb

## 2019-02-08 DIAGNOSIS — Z125 Encounter for screening for malignant neoplasm of prostate: Secondary | ICD-10-CM | POA: Diagnosis not present

## 2019-02-08 DIAGNOSIS — E1151 Type 2 diabetes mellitus with diabetic peripheral angiopathy without gangrene: Secondary | ICD-10-CM

## 2019-02-08 DIAGNOSIS — Z0000 Encounter for general adult medical examination without abnormal findings: Secondary | ICD-10-CM | POA: Diagnosis not present

## 2019-02-08 DIAGNOSIS — Z79899 Other long term (current) drug therapy: Secondary | ICD-10-CM | POA: Diagnosis not present

## 2019-02-08 DIAGNOSIS — E785 Hyperlipidemia, unspecified: Secondary | ICD-10-CM

## 2019-02-08 LAB — CBC WITH DIFFERENTIAL/PLATELET
Basophils Absolute: 0.1 10*3/uL (ref 0.0–0.1)
Basophils Relative: 1.2 % (ref 0.0–3.0)
Eosinophils Absolute: 0.2 10*3/uL (ref 0.0–0.7)
Eosinophils Relative: 3.3 % (ref 0.0–5.0)
HCT: 43.2 % (ref 39.0–52.0)
Hemoglobin: 15.2 g/dL (ref 13.0–17.0)
Lymphocytes Relative: 20.7 % (ref 12.0–46.0)
Lymphs Abs: 1.3 10*3/uL (ref 0.7–4.0)
MCHC: 35.1 g/dL (ref 30.0–36.0)
MCV: 84.5 fl (ref 78.0–100.0)
Monocytes Absolute: 0.4 10*3/uL (ref 0.1–1.0)
Monocytes Relative: 7 % (ref 3.0–12.0)
Neutro Abs: 4.2 10*3/uL (ref 1.4–7.7)
Neutrophils Relative %: 67.8 % (ref 43.0–77.0)
Platelets: 176 10*3/uL (ref 150.0–400.0)
RBC: 5.12 Mil/uL (ref 4.22–5.81)
RDW: 13.2 % (ref 11.5–15.5)
WBC: 6.2 10*3/uL (ref 4.0–10.5)

## 2019-02-08 LAB — BASIC METABOLIC PANEL WITH GFR
BUN: 19 mg/dL (ref 6–23)
CO2: 27 meq/L (ref 19–32)
Calcium: 9.7 mg/dL (ref 8.4–10.5)
Chloride: 106 meq/L (ref 96–112)
Creatinine, Ser: 1.05 mg/dL (ref 0.40–1.50)
GFR: 69.49 mL/min
Glucose, Bld: 131 mg/dL — ABNORMAL HIGH (ref 70–99)
Potassium: 4.9 meq/L (ref 3.5–5.1)
Sodium: 140 meq/L (ref 135–145)

## 2019-02-08 LAB — HEPATIC FUNCTION PANEL
ALT: 18 U/L (ref 0–53)
AST: 15 U/L (ref 0–37)
Albumin: 4.9 g/dL (ref 3.5–5.2)
Alkaline Phosphatase: 88 U/L (ref 39–117)
Bilirubin, Direct: 0.2 mg/dL (ref 0.0–0.3)
Total Bilirubin: 1.2 mg/dL (ref 0.2–1.2)
Total Protein: 7.2 g/dL (ref 6.0–8.3)

## 2019-02-08 LAB — LIPID PANEL
Cholesterol: 127 mg/dL (ref 0–200)
HDL: 39.1 mg/dL
LDL Cholesterol: 72 mg/dL (ref 0–99)
NonHDL: 87.74
Total CHOL/HDL Ratio: 3
Triglycerides: 80 mg/dL (ref 0.0–149.0)
VLDL: 16 mg/dL (ref 0.0–40.0)

## 2019-02-08 LAB — MICROALBUMIN / CREATININE URINE RATIO
Creatinine,U: 150.4 mg/dL
Microalb Creat Ratio: 5.2 mg/g (ref 0.0–30.0)
Microalb, Ur: 7.9 mg/dL — ABNORMAL HIGH (ref 0.0–1.9)

## 2019-02-08 LAB — HEMOGLOBIN A1C: Hgb A1c MFr Bld: 6.8 % — ABNORMAL HIGH (ref 4.6–6.5)

## 2019-02-08 NOTE — Patient Instructions (Signed)
[name] , Thank you for taking time to come for your Medicare Wellness Visit. I appreciate your ongoing commitment to your health goals. Please review the following plan we discussed and let me know if I can assist you in the future.   These are the goals we discussed: Goals    . DIET - INCREASE WATER INTAKE     Starting 02/08/2019, I will continue to drink at least 1 gallon of water daily.        This is a list of the screening recommended for you and due dates:  Health Maintenance  Topic Date Due  . Complete foot exam   02/13/2019*  . Eye exam for diabetics  03/01/2019  . Hemoglobin A1C  08/11/2019  . Urine Protein Check  02/08/2020  . Colon Cancer Screening  07/20/2022  . Tetanus Vaccine  06/30/2025  . Flu Shot  Completed  .  Hepatitis C: One time screening is recommended by Center for Disease Control  (CDC) for  adults born from [birth year] through 1964.   Completed  . Pneumonia vaccines  Completed  *Topic was postponed. The date shown is not the original due date.   Preventive Care for Adults  A healthy lifestyle and preventive care can promote health and wellness. Preventive health guidelines for adults include the following key practices.  . A routine yearly physical is a good [place] to check with your health care provider about your health and preventive screening. It is a chance to share any concerns and updates on your health and to receive a thorough exam.  . Visit your dentist for a routine exam and preventive care every 6 months. Brush your teeth twice a day and floss once a day. Good oral hygiene prevents tooth decay and gum disease.  . The frequency of eye exams is based on your age, health, family medical history, use  of contact lenses, and other factors. Follow your health care provider's recommendations for frequency of eye exams.  . Eat a healthy diet. Foods like vegetables, fruits, whole grains, low-fat dairy products, and lean protein foods contain the nutrients you  need without too many calories. Decrease your intake of foods high in solid fats, added sugars, and salt. Eat the right amount of calories for you. Get information about a proper diet from your health care provider, if necessary.  . Regular physical exercise is one of the most important things you can do for your health. Most adults should get at least 150 minutes of moderate-intensity exercise (any activity that increases your heart rate and causes you to sweat) each week. In addition, most adults need muscle-strengthening exercises on 2 or more days a week.  Silver Sneakers may be a benefit available to you. To determine eligibility, you may visit the website: www.silversneakers.com or contact program at 938-501-6924 Mon-Fri between 8AM-8PM.   . Maintain a healthy weight. The body mass index (BMI) is a screening tool to identify possible weight problems. It provides an estimate of body fat based on height and weight. Your health care provider can find your BMI and can help you achieve or maintain a healthy weight.   For adults 20 years and older: ? A BMI below 18.5 is considered underweight. ? A BMI of 18.5 to 24.9 is normal. ? A BMI of 25 to 29.9 is considered overweight. ? A BMI of 30 and above is considered obese.   . Maintain normal blood lipids and cholesterol levels by exercising and minimizing your intake  of saturated fat. Eat a balanced diet with plenty of fruit and vegetables. Blood tests for lipids and cholesterol should begin at age 68 and be repeated every 5 years. If your lipid or cholesterol levels are high, you are over 50, or you are at high risk for heart disease, you may need your cholesterol levels checked more frequently. Ongoing high lipid and cholesterol levels should be treated with medicines if diet and exercise are not working.  . If you smoke, find out from your health care provider how to quit. If you do not use tobacco, please do not start.  . If you choose to drink  alcohol, please do not consume more than 2 drinks per day. One drink is considered to be 12 ounces (355 mL) of beer, 5 ounces (148 mL) of wine, or 1.5 ounces (44 mL) of liquor.  . If you are 37-16 years old, ask your health care provider if you should take aspirin to prevent strokes.  . Use sunscreen. Apply sunscreen liberally and repeatedly throughout the day. You should seek shade when your shadow is shorter than you. Protect yourself by wearing long sleeves, pants, a wide-brimmed hat, and sunglasses year round, whenever you are outdoors.  . Once a month, do a whole body skin exam, using a mirror to look at the skin on your back. Tell your health care provider of new moles, moles that have irregular borders, moles that are larger than a pencil eraser, or moles that have changed in shape or color.

## 2019-02-08 NOTE — Progress Notes (Signed)
I reviewed health advisor's note, was available for consultation, and agree with documentation and plan.   Signed,  Jaskarn Schweer T. Quavon Keisling, MD  

## 2019-02-08 NOTE — Progress Notes (Signed)
PCP notes:   Health maintenance:  Foot exam - PCP follow-up requested A1C - completed Microalbumin - completed  Abnormal screenings:   Hearing - failed  Hearing Screening   125Hz  250Hz  500Hz  1000Hz  2000Hz  3000Hz  4000Hz  6000Hz  8000Hz   Right ear:   40 40 40  0    Left ear:   40 40 40  40    Vision Screening Comments: Vision exam in 2019   Patient concerns:   None  Nurse concerns:  None  Next PCP appt:   02/13/19 @ 1020

## 2019-02-08 NOTE — Progress Notes (Signed)
Subjective:   [name] is a 73 y.o. male who presents for Medicare Annual/Subsequent preventive examination.  Review of Systems:  N/A Cardiac Risk Factors include: advanced age (>44men, >14 women);obesity (BMI >30kg/m2);dyslipidemia;diabetes mellitus;hypertension     Objective:    Vitals: BP (!) 160/80 (BP Location: Right Arm, Patient Position: Sitting, Cuff Size: Large)   Pulse 70   Temp 98.5 F (36.9 C) (Oral)   Ht 5' 7.5" (1.715 m) Comment: no shoes  Wt 221 lb 11.2 oz (100.6 kg)   SpO2 95%   BMI 34.21 kg/m   Body mass index is 34.21 kg/m.  Advanced Directives 02/08/2019 02/01/2018 05/14/2017 05/14/2017 05/03/2017 05/02/2017  Does Patient Have a Medical Advance Directive? Yes Yes Yes No;Yes Yes Yes  Type of Paramedic of [place];Living will [place];Living will Healthcare Power of [place] -  Does patient want to make changes to medical advance directive? - - No - Patient declined - No - Patient declined -  Copy of [employer] in Chart? No - copy requested No - copy requested Yes - No - copy requested -    Tobacco Social History   Tobacco Use  Smoking Status Former Smoker  . Packs/day: 1.50  . Years: 20.00  . Pack years: 30.00  . Types: Cigarettes  . Last attempt to quit: 09/13/1991  . Years since quitting: 27.4  Smokeless Tobacco Never Used  Tobacco Comment   quit post MI     Counseling given: No Comment: quit post MI   Clinical Intake:  Pre-visit preparation completed: Yes  Pain : No/denies pain Pain Score: 0-No pain     Nutritional Status: BMI > 30  Obese Nutritional Risks: None Diabetes: Yes CBG done?: No Did pt. bring in CBG monitor from home?: No  How often do you need to have someone help you when you read instructions, pamphlets, or other written materials from your doctor or pharmacy?: 1 - Never What is the last grade level you completed in school?:  Associate degree  Interpreter Needed?: No  Comments: pt lives with spouse  Information entered by :: LPinson, LPN  Past Medical History:  Diagnosis Date  . Aortic transection 2005   Traumatic after a fall from a second floor. s/p repair at [facility]  . Basal cell carcinoma   . CAD (coronary artery disease) 1992   a. 1992 Acute anterior MI, thrombolytic therapy-->cath reportedly w/o significant CAD; b. 08/2016 MV: EF 57%, hypertensive respons, no ischemia/infarct; c. 04/2017 CTA chest w/ coronary Ca2+.  . Depression   . Diastolic dysfunction    a. 04/2017 Echo: EF 60-65%, Gr1 DD, nl RV fxn.  . Erectile dysfunction   . Fall 2005   fell off house: torn aorta, clavicle fracture, rib fracture, vertebral fractures, lung contusion, coma x 2 weeks  . Fracture of femoral neck, left ([place]) 08/09/2014   ORIF, Dr. Marry Guan  . Fracture of radial neck, left, closed 08/09/2014  . Gallstone pancreatitis   . Gastric ulcer   . History of ATN 08/09/2014   [facility], 2 days of dialysis ([facility])  . Hyperlipidemia    Statin with joint pain   . Hypertension   . Obesity   . Peripheral vascular disease ([place])    Atherosclerotic:R renal artery stenosis  . Type II diabetes mellitus ([place])   . Upper GI bleed    a. 04/2017 admit w/ melena and HGB down to 7 req prbc's; b. 04/2017  EGD: small HH, non-bleeding gastric & duod ulcers, non-bleeding erosive gastropathy-->PPI Rx.   Past Surgical History:  Procedure Laterality Date  . CARDIAC CATHETERIZATION  08/1991   50 % mid-Lad stenosis with clot, 25-505 second marginal  . CARDIOVASCULAR SURGERY     with ruptured Aorta, Dr. Camila Li, [facility]   . CHOLECYSTECTOMY    . ESOPHAGOGASTRODUODENOSCOPY (EGD) WITH PROPOFOL N/A 05/03/2017   Procedure: ESOPHAGOGASTRODUODENOSCOPY (EGD) WITH PROPOFOL;  Surgeon: Lucilla Lame, MD;  Location: [facility] ENDOSCOPY;  Service: Endoscopy;  Laterality: N/A;  . KNEE SURGERY     Right   . ORIF HIP FRACTURE Left 08/12/2014   Dr. Marry Guan  . THORACOTOMY      thoracic aorta repair   . TRACHEOSTOMY     s/p reversal  . VIDEO BRONCHOSCOPY Bilateral 05/27/2017   Procedure: VIDEO BRONCHOSCOPY WITHOUT FLUORO;  Surgeon: Juanito Doom, MD;  Location: [facility] ENDOSCOPY;  Service: Cardiopulmonary;  Laterality: Bilateral;   Family History  Problem Relation Age of Onset  . Dementia Mother 39  . Heart attack Father 42  . Diabetes Brother 66  . Heart attack Brother   . Diabetes Brother   . Colon cancer Neg Hx   . Stomach cancer Neg Hx   . Esophageal cancer Neg Hx   . Rectal cancer Neg Hx    Social History   Socioeconomic History  . Marital status: Married    Spouse name: Not on file  . Number of children: Not on file  . Years of education: Not on file  . Highest education level: Not on file  Occupational History  . 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 Financial resource strain: Not on file  . Food insecurity:    Worry: Not on file    Inability: Not on file  . Transportation needs:    Medical: Not on file    Non-medical: Not on file  Tobacco Use  . Smoking status: Former Smoker    Packs/day: 1.50    Years: 20.00    Pack years: 30.00    Types: Cigarettes    Last attempt to quit: 09/13/1991    Years since quitting: 27.4  . Smokeless tobacco: Never Used  . Tobacco comment: quit post MI  Substance and Sexual Activity  . Alcohol use: Yes    Comment: beer occasionally   . Drug use: No  . Sexual activity: Not on file  Lifestyle  . Physical activity:    Days per week: Not on file    Minutes per session: Not on file  . Stress: Not on file  Relationships  . Social connections:    Talks on phone: Not on file    Gets together: Not on file    Attends religious service: Not on file    Active member of club or organization: Not on file    Attends meetings of clubs or organizations: Not on file    Relationship status: Not on file  Other Topics Concern  . Not on file  Social History Narrative   No  regular exercise     Outpatient Encounter Medications as of 02/08/2019  Medication Sig  . amLODipine (NORVASC) 5 MG tablet TAKE ONE TABLET BY MOUTH EVERY DAY  . aspirin 81 MG chewable tablet Chew 81 mg by mouth daily.  [name] Kitchen atorvastatin (LIPITOR) 40 MG tablet TAKE 1 TABLET DAILY  . busPIRone (BUSPAR) 5 MG tablet TAKE ONE TABLET 3 TIMES DAILY  . citalopram (CELEXA)  10 MG tablet TAKE 1 TABLET BY MOUTH DAILY  . FEROSUL 325 (65 Fe) MG tablet TAKE 1 TABLET BY MOUTH DAILY  . Glycopyrrolate-Formoterol (BEVESPI AEROSPHERE) 9-4.8 MCG/ACT AERO Inhale 2 puffs into the lungs 2 (two) times daily.  . metFORMIN (GLUCOPHAGE-XR) 500 MG 24 hr tablet TAKE ONE TABLET BY MOUTH EVERY DAY  . pantoprazole (PROTONIX) 40 MG tablet TAKE ONE TABLET BY MOUTH EVERY DAY  . [DISCONTINUED] busPIRone (BUSPAR) 5 MG tablet TAKE ONE TABLET 3 TIMES DAILY  . nitroGLYCERIN (NITROSTAT) 0.4 MG SL tablet Place 1 tablet (0.4 mg total) under the tongue every 5 (five) minutes as needed for chest pain.   No facility-administered encounter medications on file as of 02/08/2019.     Activities of Daily Living In your present state of health, do you have any difficulty performing the following activities: 02/08/2019  Hearing? N  Vision? N  Difficulty concentrating or making decisions? N  Walking or climbing stairs? N  Dressing or bathing? N  Doing errands, shopping? N  Preparing Food and eating ? N  Using the Toilet? N  In the past six months, have you accidently leaked urine? N  Do you have problems with loss of bowel control? N  Managing your Medications? N  Managing your Finances? N  Housekeeping or managing your Housekeeping? N  Some recent data might be hidden    Patient Care Team: [name], MD as PCP - General Sydnee Levans, MD as Consulting Physician (Dermatology)   Assessment:   This is a routine wellness examination for [place].   Hearing Screening   125Hz  250Hz  500Hz  1000Hz  2000Hz  3000Hz  4000Hz  6000Hz   8000Hz   Right ear:   40 40 40  0    Left ear:   40 40 40  40    Vision Screening Comments: Vision exam in 2019    Exercise Activities and Dietary recommendations Current Exercise Habits: Home exercise routine, Type of exercise: walking, Time (Minutes): 30, Frequency (Times/Week): 3, Weekly Exercise (Minutes/Week): 90, Intensity: Mild, Exercise limited by: None identified  Goals    . DIET - INCREASE WATER INTAKE     Starting 02/08/2019, I will continue to drink at least 1 gallon of water daily.        Fall Risk Fall Risk  02/08/2019 02/01/2018 01/27/2017 11/18/2015 07/20/2013  Falls in the past year? 0 No No No Yes    Depression Screen PHQ 2/9 Scores 02/08/2019 02/01/2018 01/27/2017 11/18/2015  PHQ - 2 Score 0 0 0 0  PHQ- 9 Score 0 0 - -    Cognitive Function MMSE - Mini Mental State Exam 02/08/2019 02/01/2018  Orientation to time 5 5  Orientation to Place 5 5  Registration 3 3  Attention/ Calculation 0 0  Recall 3 3  Language- name 2 objects 0 0  Language- repeat 1 1  Language- follow 3 step command 3 2  Language- follow 3 step command-comments - unable to follow 1 step of 3 step command  Language- read & follow direction 0 0  Write a sentence 0 0  Copy design 0 0  Total score 20 19     PLEASE NOTE: A Mini-Cog screen was completed. Maximum score is 20. A value of 0 denotes this part of [place] MMSE was not completed or the patient failed this part of the Mini-Cog screening.   Mini-Cog Screening Orientation to Time - Max 5 pts Orientation to Place - Max 5 pts Registration - Max 3 pts Recall - Max 3 pts  Language Repeat - Max 1 pts Language Follow 3 Step Command - Max 3 pts     Immunization History  Administered Date(s) Administered  . Influenza Split 10/04/2012  . Influenza Whole 08/16/2008, 08/01/2014  . Influenza, High Dose Seasonal PF 09/16/2017  . Influenza,inj,Quad PF,6+ Mos 10/11/2013, 10/18/2015, 08/24/2016, 09/29/2018  . Pneumococcal Conjugate-13 11/18/2015  .  Pneumococcal Polysaccharide-23 06/13/2012  . Td 07/01/2015    Screening Tests Health Maintenance  Topic Date Due  . FOOT EXAM  02/13/2019 (Originally 07/21/2014)  . OPHTHALMOLOGY EXAM  03/01/2019  . HEMOGLOBIN A1C  08/11/2019  . URINE MICROALBUMIN  02/08/2020  . COLONOSCOPY  07/20/2022  . TETANUS/TDAP  06/30/2025  . INFLUENZA VACCINE  Completed  . Hepatitis C Screening  Completed  . PNA vac Low Risk Adult  Completed       Plan:     I have personally reviewed, addressed, and noted the following in the patient's chart:  A. Medical and social history B. Use of alcohol, tobacco or illicit drugs  C. Current medications and supplements D. Functional ability and status E.  Nutritional status F.  Physical activity G. Advance directives H. List of other physicians I.  Hospitalizations, surgeries, and ER visits in previous 12 months J.  [place] to include hearing, vision, cognitive, depression L. Referrals and appointments - none  In addition, I have reviewed and discussed with patient certain preventive protocols, quality metrics, and best practice recommendations. A written personalized care plan for preventive services as well as general preventive health recommendations were provided to patient.  See attached scanned questionnaire for additional information.   Signed,   Lindell Noe, MHA, BS, LPN Health Coach

## 2019-02-09 LAB — PSA, TOTAL AND FREE
PSA, % Free: 31 %
PSA, Free: 0.5 ng/mL
PSA, Total: 1.6 ng/mL

## 2019-02-12 NOTE — Progress Notes (Deleted)
Dr. Frederico Hamman T. , MD, [facility] [place], [address] Phone: [phone] Fax: [phone]  02/13/2019  Patient: [name], MRN: [id number], DOB: 1946-02-06, 73 y.o.  Primary Physician:  [name], MD   No chief complaint on file.  Subjective:   [name] is a 73 y.o. pleasant patient who presents with the following:  Preventative Health Maintenance Visit:  Health Maintenance Summary Reviewed and updated, unless pt declines services.  Tobacco History Reviewed. Alcohol: No concerns, no excessive use Exercise Habits: Some activity, rec at least 30 mins 5 times a week STD concerns: no risk or activity to increase risk Drug Use: None Encouraged self-testicular check  Health Maintenance  Topic Date Due  . FOOT EXAM  02/13/2019 (Originally 07/21/2014)  . OPHTHALMOLOGY EXAM  03/01/2019  . HEMOGLOBIN A1C  08/11/2019  . URINE MICROALBUMIN  02/08/2020  . COLONOSCOPY  07/20/2022  . TETANUS/TDAP  06/30/2025  . INFLUENZA VACCINE  Completed  . Hepatitis C Screening  Completed  . PNA vac Low Risk Adult  Completed   Immunization History  Administered Date(s) Administered  . Influenza Split 10/04/2012  . Influenza Whole 08/16/2008, 08/01/2014  . Influenza, High Dose Seasonal PF 09/16/2017  . Influenza,inj,Quad PF,6+ Mos 10/11/2013, 10/18/2015, 08/24/2016, 09/29/2018  . Pneumococcal Conjugate-13 11/18/2015  . Pneumococcal Polysaccharide-23 06/13/2012  . Td 07/01/2015   Patient Active Problem List   Diagnosis Date Noted  . OSA (obstructive sleep apnea) 09/15/2017  . Coronary artery disease of native artery of native heart with stable angina pectoris ([place]) 08/18/2017  . COPD (chronic obstructive pulmonary disease) ([place]) 05/14/2017  . Radial neck fracture 09/14/2014  . Fracture of femoral neck, left ([place]) 09/14/2014  . Centrilobular emphysema ([place]) 09/12/2014  . Aortic transection   . Peripheral  vascular disease ([place])   . History of MI (myocardial infarction)   . Duodenal ulcer with hemorrhage 04/30/2011  . DM (diabetes mellitus) type II, controlled, with peripheral vascular disorder ([place]) 02/13/2010  . Hyperlipidemia LDL goal <70 08/16/2008  . ERECTILE DYSFUNCTION 08/16/2008  . Major depressive disorder, recurrent, in remission ([place]) 08/16/2008  . Essential hypertension 08/16/2008  . Coronary artery disease involving native coronary artery without angina pectoris 08/16/2008   Past Medical History:  Diagnosis Date  . Aortic transection 2005   Traumatic after a fall from a second floor. s/p repair at [facility]  . Basal cell carcinoma   . CAD (coronary artery disease) 1992   a. 1992 Acute anterior MI, thrombolytic therapy-->cath reportedly w/o significant CAD; b. 08/2016 MV: EF 57%, hypertensive respons, no ischemia/infarct; c. 04/2017 CTA chest w/ coronary Ca2+.  . Depression   . Diastolic dysfunction    a. 04/2017 Echo: EF 60-65%, Gr1 DD, nl RV fxn.  . Erectile dysfunction   . Fall 2005   fell off house: torn aorta, clavicle fracture, rib fracture, vertebral fractures, lung contusion, coma x 2 weeks  . Fracture of femoral neck, left ([place]) 08/09/2014   ORIF, Dr. Marry Guan  . Fracture of radial neck, left, closed 08/09/2014  . Gallstone pancreatitis   . Gastric ulcer   . History of ATN 08/09/2014   [facility], 2 days of dialysis (ARF)  . Hyperlipidemia    Statin with joint pain   . Hypertension   . Obesity   . Peripheral vascular disease ([place])    Atherosclerotic:R renal artery stenosis  . Type II diabetes mellitus ([place])   . Upper GI bleed  a. 04/2017 admit w/ melena and HGB down to 7 req prbc's; b. 04/2017 EGD: small HH, non-bleeding gastric & duod ulcers, non-bleeding erosive gastropathy-->PPI Rx.   Past Surgical History:  Procedure Laterality Date  . CARDIAC CATHETERIZATION  08/1991   50 % mid-Lad stenosis with clot, 25-505 second marginal  . CARDIOVASCULAR SURGERY     with ruptured  Aorta, Dr. Camila Li, [facility]   . CHOLECYSTECTOMY    . ESOPHAGOGASTRODUODENOSCOPY (EGD) WITH PROPOFOL N/A 05/03/2017   Procedure: ESOPHAGOGASTRODUODENOSCOPY (EGD) WITH PROPOFOL;  Surgeon: Lucilla Lame, MD;  Location: [facility] ENDOSCOPY;  Service: Endoscopy;  Laterality: N/A;  . KNEE SURGERY     Right   . ORIF HIP FRACTURE Left 08/12/2014   Dr. Marry Guan  . THORACOTOMY     thoracic aorta repair   . TRACHEOSTOMY     s/p reversal  . VIDEO BRONCHOSCOPY Bilateral 05/27/2017   Procedure: VIDEO BRONCHOSCOPY WITHOUT FLUORO;  Surgeon: Juanito Doom, MD;  Location: [facility] ENDOSCOPY;  Service: Cardiopulmonary;  Laterality: Bilateral;   Social History   Socioeconomic History  . Marital status: Married    Spouse name: Not on file  . Number of children: Not on file  . Years of education: Not on file  . Highest education level: Not on file  Occupational History  . 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 Financial resource strain: Not on file  . Food insecurity:    Worry: Not on file    Inability: Not on file  . Transportation needs:    Medical: Not on file    Non-medical: Not on file  Tobacco Use  . Smoking status: Former Smoker    Packs/day: 1.50    Years: 20.00    Pack years: 30.00    Types: Cigarettes    Last attempt to quit: 09/13/1991    Years since quitting: 27.4  . Smokeless tobacco: Never Used  . Tobacco comment: quit post MI  Substance and Sexual Activity  . Alcohol use: Yes    Comment: beer occasionally   . Drug use: No  . Sexual activity: Not on file  Lifestyle  . Physical activity:    Days per week: Not on file    Minutes per session: Not on file  . Stress: Not on file  Relationships  . Social connections:    Talks on phone: Not on file    Gets together: Not on file    Attends religious service: Not on file    Active member of club or organization: Not on file    Attends meetings of clubs or organizations: Not on file    Relationship  status: Not on file  . Intimate partner violence:    Fear of current or ex partner: Not on file    Emotionally abused: Not on file    Physically abused: Not on file    Forced sexual activity: Not on file  Other Topics Concern  . Not on file  Social History Narrative   No regular exercise    Family History  Problem Relation Age of Onset  . Dementia Mother 52  . Heart attack Father 65  . Diabetes Brother 63  . Heart attack Brother   . Diabetes Brother   . Colon cancer Neg Hx   . Stomach cancer Neg Hx   . Esophageal cancer Neg Hx   . Rectal cancer Neg Hx    Allergies  Allergen Reactions  . Contrast Media [Iodinated  Diagnostic Agents]     Severe hives  . Xarelto [Rivaroxaban] Other (See Comments)    Reaction: GI Bleed  . Clindamycin/Lincomycin Rash  . Dye Fdc Red [Red Dye] Rash  . Lovenox [Enoxaparin Sodium]     Rash, blackness at injection site. Multifactorial and multiple other drugs used at the same time.  [name] Kitchen Penicillins Rash and Other (See Comments)    Has patient had a PCN reaction causing immediate rash, facial/tongue/throat swelling, SOB or lightheadedness with hypotension: Unknown Has patient had a PCN reaction causing severe rash involving mucus membranes or skin necrosis: Unknown Has patient had a PCN reaction that required hospitalization: Unknown Has patient had a PCN reaction occurring within the last 10 years: No If all of the above answers are "NO", then may proceed with Cephalosporin use.     Medication list has been reviewed and updated.   General: Denies fever, chills, sweats. No significant weight loss. Eyes: Denies blurring,significant itching ENT: Denies earache, sore throat, and hoarseness. Cardiovascular: Denies chest pains, palpitations, dyspnea on exertion Respiratory: Denies cough, dyspnea at rest,wheeezing Breast: no concerns about lumps GI: Denies nausea, vomiting, diarrhea, constipation, change in bowel habits, abdominal pain, melena,  hematochezia GU: Denies penile discharge, ED, urinary flow / outflow problems. No STD concerns. Musculoskeletal: Denies back pain, joint pain Derm: Denies rash, itching Neuro: Denies  paresthesias, frequent falls, frequent headaches Psych: Denies depression, anxiety Endocrine: Denies cold intolerance, heat intolerance, polydipsia Heme: Denies enlarged lymph nodes Allergy: No hayfever  Objective:   There were no vitals taken for this visit. Ideal Body Weight:    No exam data present  GEN: well developed, well nourished, no acute distress Eyes: conjunctiva and lids normal, PERRLA, EOMI ENT: TM clear, nares clear, oral exam WNL Neck: supple, no lymphadenopathy, no thyromegaly, no JVD Pulm: clear to auscultation and percussion, respiratory effort normal CV: regular rate and rhythm, S1-S2, no murmur, rub or gallop, no bruits, peripheral pulses normal and symmetric, no cyanosis, clubbing, edema or varicosities GI: soft, non-tender; no hepatosplenomegaly, masses; active bowel sounds all quadrants GU: no hernia, testicular mass, penile discharge Lymph: no cervical, axillary or inguinal adenopathy MSK: gait normal, muscle tone and strength WNL, no joint swelling, effusions, discoloration, crepitus  SKIN: clear, good turgor, color WNL, no rashes, lesions, or ulcerations Neuro: normal mental status, normal strength, sensation, and motion Psych: alert; oriented to person, place and time, normally interactive and not anxious or depressed in appearance. All labs reviewed with patient.  Lipids: Lab Results  Component Value Date   CHOL 127 02/08/2019   Lab Results  Component Value Date   HDL 39.10 02/08/2019   Lab Results  Component Value Date   LDLCALC 72 02/08/2019   Lab Results  Component Value Date   TRIG 80.0 02/08/2019   Lab Results  Component Value Date   CHOLHDL 3 02/08/2019   CBC: CBC Latest Ref Rng & Units 02/08/2019 02/01/2018 05/20/2017  WBC 4.0 - 10.5 K/uL 6.2 9.3 7.1   Hemoglobin 13.0 - 17.0 g/dL 15.2 15.7 11.6(L)  Hematocrit 39.0 - 52.0 % 43.2 44.0 33.8(L)  Platelets 150.0 - 400.0 K/uL 176.0 182.0 222.0 Repeated and verified X2.    Basic Metabolic Panel:    Component Value Date/Time   NA 140 02/08/2019 1041   NA 140 08/23/2014 0619   K 4.9 02/08/2019 1041   K 4.7 08/23/2014 0619   CL 106 02/08/2019 1041   CL 109 (H) 08/23/2014 0619   CO2 27 02/08/2019 1041   CO2  25 08/23/2014 0619   BUN 19 02/08/2019 1041   BUN 14 08/23/2014 0619   CREATININE 1.05 02/08/2019 1041   CREATININE 0.99 08/23/2014 0619   GLUCOSE 131 (H) 02/08/2019 1041   GLUCOSE 129 (H) 08/23/2014 0619   CALCIUM 9.7 02/08/2019 1041   CALCIUM 8.2 (L) 08/23/2014 0619   Hepatic Function Latest Ref Rng & Units 02/08/2019 02/01/2018 05/14/2017  Total Protein 6.0 - 8.3 g/dL 7.2 7.2 6.8  Albumin 3.5 - 5.2 g/dL 4.9 4.8 4.1  AST 0 - 37 U/L _0 ALT 0 - 53 U/L _1 Alk Phosphatase 39 - 117 U/L 88 121(H) 75  Total Bilirubin 0.2 - 1.2 mg/dL 1.2 1.2 1.3(H)  Bilirubin, Direct 0.0 - 0.3 mg/dL 0.2 0.3 -    Lab Results  Component Value Date   HGBA1C 6.8 (H) 02/08/2019   Lab Results  Component Value Date   TSH 1.22 08/24/2016   Lab Results  Component Value Date   PSA 1.77 02/01/2018   PSA 1.81 01/27/2017   PSA 1.64 11/11/2015    Assessment and Plan:   Healthcare maintenance  Health Maintenance Exam: The patient's preventative maintenance and recommended screening tests for an annual wellness exam were reviewed in full today. Brought up to date unless services declined.  Counselled on the importance of diet, exercise, and its role in overall health and mortality. The patient's FH and SH was reviewed, including their home life, tobacco status, and drug and alcohol status.  Follow-up in 1 year for physical exam or additional follow-up below.  Follow-up: No follow-ups on file. Or follow-up in 1 year if not noted.  Signed,  Maud Deed. , MD   Allergies as of  02/13/2019      Reactions   Contrast Media [iodinated Diagnostic Agents]    Severe hives   Xarelto [rivaroxaban] Other (See Comments)   Reaction: GI Bleed   Clindamycin/lincomycin Rash   Dye Fdc Red [red Dye] Rash   Lovenox [enoxaparin Sodium]    Rash, blackness at injection site. Multifactorial and multiple other drugs used at the same time.   Penicillins Rash, Other (See Comments)   Has patient had a PCN reaction causing immediate rash, facial/tongue/throat swelling, SOB or lightheadedness with hypotension: Unknown Has patient had a PCN reaction causing severe rash involving mucus membranes or skin necrosis: Unknown Has patient had a PCN reaction that required hospitalization: Unknown Has patient had a PCN reaction occurring within the last 10 years: No If all of the above answers are "NO", then may proceed with Cephalosporin use.      Medication List       Accurate as of February 12, 2019  9:27 AM. Always use your most recent med list.        amLODipine 5 MG tablet Commonly known as:  NORVASC TAKE ONE TABLET BY MOUTH EVERY DAY   aspirin 81 MG chewable tablet Chew 81 mg by mouth daily.   atorvastatin 40 MG tablet Commonly known as:  LIPITOR TAKE 1 TABLET DAILY   busPIRone 5 MG tablet Commonly known as:  BUSPAR TAKE ONE TABLET 3 TIMES DAILY   citalopram 10 MG tablet Commonly known as:  CELEXA TAKE 1 TABLET BY MOUTH DAILY   FeroSul 325 (65 FE) MG tablet Generic drug:  ferrous sulfate TAKE 1 TABLET BY MOUTH DAILY   Glycopyrrolate-Formoterol 9-4.8 MCG/ACT Aero Commonly known as:  Bevespi Aerosphere Inhale 2 puffs into the lungs 2 (two) times daily.   metFORMIN 500  MG 24 hr tablet Commonly known as:  GLUCOPHAGE-XR TAKE ONE TABLET BY MOUTH EVERY DAY   nitroGLYCERIN 0.4 MG SL tablet Commonly known as:  NITROSTAT Place 1 tablet (0.4 mg total) under the tongue every 5 (five) minutes as needed for chest pain.   pantoprazole 40 MG tablet Commonly known as:  PROTONIX  TAKE ONE TABLET BY MOUTH EVERY DAY

## 2019-02-13 ENCOUNTER — Encounter: Payer: Self-pay | Admitting: Family Medicine

## 2019-03-07 ENCOUNTER — Telehealth: Payer: Self-pay

## 2019-03-07 NOTE — Telephone Encounter (Signed)
Virtual Visit Pre-Appointment Phone Call  Steps For Call:  1. Confirm consent - "In the setting of the current Covid19 crisis, you are scheduled for a VIDEO visit with your provider on 03/10/2019 at 11:00AM.  Just as we do with many in-office visits, in order for you to participate in this visit, we must obtain consent.  If you'd like, I can send this to your mychart (if signed up) or email for you to review.  Otherwise, I can obtain your verbal consent now.  All virtual visits are billed to your insurance company just like a normal visit would be.  By agreeing to a virtual visit, we'd like you to understand that the technology does not allow for your provider to perform an examination, and thus may limit your provider's ability to fully assess your condition.  Finally, though the technology is pretty good, we cannot assure that it will always work on either your or our end, and in the setting of a video visit, we may have to convert it to a phone-only visit.  In either situation, we cannot ensure that we have a secure connection.  Are you willing to proceed?"  2. Give patient instructions for WebEx download to smartphone as below if video visit  3. Advise patient to be prepared with any vital sign or heart rhythm information, their current medicines, and a piece of paper and pen handy for any instructions they may receive the day of their visit  4. Inform patient they will receive a phone call 15 minutes prior to their appointment time (may be from unknown caller ID) so they should be prepared to answer  5. Confirm that appointment type is correct in Epic appointment notes (video vs telephone)    TELEPHONE CALL NOTE  [name] has been deemed a candidate for a follow-up tele-health visit to limit community exposure during the Covid-19 pandemic. I spoke with the patient via phone to ensure availability of phone/video source, confirm preferred email & phone number, and discuss instructions  and expectations.  I reminded [name] to be prepared with any vital sign and/or heart rhythm information that could potentially be obtained via home monitoring, at the time of his visit. I reminded [name] to expect a phone call at the time of his visit if his visit.  Did the patient verbally acknowledge consent to treatment? yes  [name], [place] 03/07/2019 3:02 PM   DOWNLOADING THE [place], go to [employer] and type in WebEx in the search bar. [place] [employer], the blue/green circle. The app is free but as with any other app downloads, their phone may require them to verify saved payment information or Apple password. The patient does NOT have to create an account.  - If Android, ask patient to go to [employer] and type in WebEx in the search bar. [place] [employer], the blue/green circle. The app is free but as with any other app downloads, their phone may require them to verify saved payment information or Android password. The patient does NOT have to create an account.   CONSENT FOR TELE-HEALTH VISIT - PLEASE REVIEW  I hereby voluntarily request, consent and authorize [facility] and its employed or contracted physicians, physician assistants, nurse practitioners or other licensed health care professionals (the Practitioner), to provide me with telemedicine health care services (the "Services") as deemed necessary by the treating Practitioner. I acknowledge  and consent to receive the Services by the Practitioner via telemedicine. I understand that the telemedicine visit will involve communicating with the Practitioner through live audiovisual communication technology and the disclosure of certain medical information by electronic transmission. I acknowledge that I have been given the opportunity to request an in-person assessment or other available alternative prior to the telemedicine visit and am voluntarily  participating in the telemedicine visit.  I understand that I have the right to withhold or withdraw my consent to the use of telemedicine in the course of my care at any time, without affecting my right to future care or treatment, and that the Practitioner or I may terminate the telemedicine visit at any time. I understand that I have the right to inspect all information obtained and/or recorded in the course of the telemedicine visit and may receive copies of available information for a reasonable fee.  I understand that some of the potential risks of receiving the Services via telemedicine include:  [name] Kitchen Delay or interruption in medical evaluation due to technological equipment failure or disruption; . Information transmitted may not be sufficient (e.g. poor resolution of images) to allow for appropriate medical decision making by the Practitioner; and/or  . In rare instances, security protocols could fail, causing a breach of personal health information.  Furthermore, I acknowledge that it is my responsibility to provide information about my medical history, conditions and care that is complete and accurate to the best of my ability. I acknowledge that Practitioner's advice, recommendations, and/or decision may be based on factors not within their control, such as incomplete or inaccurate data provided by me or distortions of diagnostic images or specimens that may result from electronic transmissions. I understand that the practice of medicine is not an exact science and that Practitioner makes no warranties or guarantees regarding treatment outcomes. I acknowledge that I will receive a copy of this consent concurrently upon execution via email to the email address I last provided but may also request a printed copy by calling the office of [facility].    I understand that my insurance will be billed for this visit.   I have read or had this consent read to me. . I understand the contents of this  consent, which adequately explains the benefits and risks of the Services being provided via telemedicine.  . I have been provided ample opportunity to ask questions regarding this consent and the Services and have had my questions answered to my satisfaction. . I give my informed consent for the services to be provided through the use of telemedicine in my medical care  By participating in this telemedicine visit I agree to the above.

## 2019-03-09 NOTE — Progress Notes (Addendum)
Virtual Visit via Video Note   This visit type was conducted due to national recommendations for restrictions regarding the COVID-19 Pandemic (e.g. social distancing) in an effort to limit this patient's exposure and mitigate transmission in our community.  Due to her co-morbid illnesses, this patient is at least at moderate risk for complications without adequate follow up.  This format is felt to be most appropriate for this patient at this time.  All issues noted in this document were discussed and addressed.  A limited physical exam was performed with this format.  Please refer to the patient's chart for her consent to telehealth for [name].    Date:  03/09/2019   ID:  [name], DOB [birth year], MRN [id number]  Patient Location:  [address] [place] [address]   Provider location:   [name], [name]  PCP:  Colton Loffler, MD  Cardiologist:  [name]  Chief Complaint:  SOB  Last follow up in 2018   History of Present Illness:    [name] is a 73 y.o. male who presents via audio/video conferencing for a telehealth visit today.   The patient does not symptoms concerning for COVID-19 infection (fever, chills, cough, or new SHORTNESS OF BREATH).   Patient has a past medical history of 73 year old male with  status post anterior myocardial infarction in 1992, which was treated with TPA.  Catheterization  did not show any significant coronary artery disease. [name] 2005 off house,  torn aorta, clavicle fracture, rib fracture, vertebral fractures, lung contusion, coma x 2 weeks hypertension,  hyperlipidemia,  Diabetes, obesity,  peptic ulcer disease.  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   hemoglobin dropped to 7.  transfusion.   EGD by Dr. [facility] which showed gastric and duodenal nonbleeding ulcers and also erosive gastropathy.   PPI therapy.   CTA of the chest  significant contrast reaction  Admitted Troponin trend was flat at 0.20  0.16  0.16  normal LV function by echo   negative stress test   Prior CV studies:   The following studies were reviewed today:  October 2017  for dyspnea on exertion.  Echocardiogram showed normal LV function with grade 1 diastolic dysfunction.   Stress testing in 08/2016 was normal with the exception of hypertensive response to exercise.   Past Medical History:  Diagnosis Date  . Aortic transection 2005   Traumatic after a fall from a second floor. s/p repair at [name]  . Basal cell carcinoma   . CAD (coronary artery disease) 1992   a. 1992 Acute anterior MI, thrombolytic therapy-->cath reportedly w/o significant CAD; b. 08/2016 MV:  EF 57%, hypertensive respons, no ischemia/infarct; c. 04/2017 CTA chest w/ coronary Ca2+.  . Depression   . Diastolic dysfunction    a. 04/2017 Echo: EF 60-65%, Gr1 DD, nl RV fxn.  . Erectile dysfunction   . Fall 2005   fell off house: torn aorta, clavicle fracture, rib fracture, vertebral fractures, lung contusion, coma x 2 weeks  . Fracture of femoral neck, left ([name]) 08/09/2014   ORIF, Dr. Marry [facility]  . Fracture of radial neck, left, closed 08/09/2014  .  Gallstone pancreatitis   . Gastric ulcer   . History of ATN 08/09/2014   [name], 2 days of dialysis (ARF)  . Hyperlipidemia    Statin with joint pain   . Hypertension   . Obesity   . Peripheral vascular disease ([name])    Atherosclerotic:R renal artery stenosis  . Type II diabetes mellitus ([name])   . Upper GI bleed    a. 04/2017 admit w/ melena and HGB down to 7 req prbc's; b. 04/2017 EGD: small HH, non-bleeding gastric & duod ulcers, non-bleeding erosive gastropathy-->PPI Rx.   Past Surgical History:  Procedure Laterality Date  . CARDIAC CATHETERIZATION  08/1991   50 % mid-Lad stenosis with clot, 25-505 second marginal  . CARDIOVASCULAR SURGERY     with ruptured Aorta, Dr. Camila [facility], [facility]   . CHOLECYSTECTOMY    . ESOPHAGOGASTRODUODENOSCOPY (EGD) WITH PROPOFOL N/A 05/03/2017   Procedure: ESOPHAGOGASTRODUODENOSCOPY (EGD) WITH PROPOFOL;  Surgeon: Colton Lame, MD;  Location: [facility] ENDOSCOPY;  Service: Endoscopy;  Laterality: N/A;  . KNEE SURGERY     Right   . ORIF HIP FRACTURE Left 08/12/2014   Dr. [facility]  . THORACOTOMY     thoracic aorta repair   . TRACHEOSTOMY     s/p reversal  . VIDEO BRONCHOSCOPY Bilateral 05/27/2017   Procedure: VIDEO BRONCHOSCOPY WITHOUT FLUORO;  Surgeon: Colton Doom, MD;  Location: [facility] ENDOSCOPY;  Service: Cardiopulmonary;  Laterality: Bilateral;     No outpatient medications have been marked as taking for the 03/10/19 encounter (Appointment) with Colton Merritts, MD.     Allergies:   Contrast media [iodinated diagnostic agents]; Xarelto [rivaroxaban]; Clindamycin/lincomycin; Dye fdc red [red dye]; Lovenox [enoxaparin sodium]; and Penicillins   Social History   Tobacco Use  . Smoking status: Former Smoker    Packs/day: 1.50    Years: 20.00    Pack years: 30.00    Types: Cigarettes    Last attempt to quit: 09/13/1991    Years since quitting: 27.5  . Smokeless tobacco: Never Used  . Tobacco comment: quit post MI  Substance Use Topics  . Alcohol  use: Yes    Comment: beer occasionally   . Drug use: No     Current Outpatient Medications on File Prior to Visit  Medication Sig Dispense Refill  . amLODipine (NORVASC) 5 MG tablet TAKE ONE TABLET BY MOUTH EVERY DAY 90 tablet 0  . aspirin 81 MG chewable tablet Chew 81 mg by mouth daily.    [name] Kitchen atorvastatin (LIPITOR) 40 MG tablet TAKE 1 TABLET DAILY 90 tablet 3  . busPIRone (BUSPAR) 5 MG tablet TAKE ONE TABLET 3 TIMES DAILY 60 tablet 1  . citalopram (CELEXA) 10 MG tablet TAKE 1 TABLET BY MOUTH DAILY 90 tablet 1  . FEROSUL 325 (65 Fe) MG tablet TAKE 1 TABLET BY MOUTH DAILY 90 tablet 0  . Glycopyrrolate-Formoterol (BEVESPI AEROSPHERE) 9-4.8 MCG/ACT AERO Inhale 2 puffs into the lungs 2 (two) times daily. 2 Inhaler 0  . metFORMIN (GLUCOPHAGE-XR)  500 MG 24 hr tablet TAKE ONE TABLET BY MOUTH EVERY DAY 30 tablet 5  . nitroGLYCERIN (NITROSTAT) 0.4 MG SL tablet Place 1 tablet (0.4 mg total) under the tongue every 5 (five) minutes as needed for chest pain. 25 tablet 6  . pantoprazole (PROTONIX) 40 MG tablet TAKE ONE TABLET BY MOUTH EVERY DAY 30 tablet 6   No current facility-administered medications on file prior to visit.      Family Hx: The patient's family history includes Dementia (age of onset: 27) in his mother; Diabetes in his brother; Diabetes (age of onset: 23) in his brother; Heart attack in his brother; Heart attack (age of onset: 46) in his father. There is no history of Colon cancer, Stomach cancer, Esophageal cancer, or Rectal cancer.  ROS:   Please see the history of present illness.    Review of Systems  Constitutional: Negative.   Respiratory: Positive for shortness of breath.   Cardiovascular: Negative.   Gastrointestinal: Negative.   Musculoskeletal: Negative.   Neurological: Negative.   Psychiatric/Behavioral: Negative.   All other systems reviewed and are negative.     Labs/Other Tests and Data Reviewed:    Recent Labs: 02/08/2019: ALT 18; BUN 19; Creatinine, Ser  1.05; Hemoglobin 15.2; Platelets 176.0; Potassium 4.9; Sodium 140   Recent Lipid Panel Lab Results  Component Value Date/Time   CHOL 127 02/08/2019 10:41 AM   CHOL 120 08/10/2014 09:28 PM   TRIG 80.0 02/08/2019 10:41 AM   TRIG 164 08/10/2014 09:28 PM   HDL 39.10 02/08/2019 10:41 AM   HDL 29 (L) 08/10/2014 09:28 PM   CHOLHDL 3 02/08/2019 10:41 AM   LDLCALC 72 02/08/2019 10:41 AM   LDLCALC 58 08/10/2014 09:28 PM   LDLDIRECT 173.5 05/23/2012 12:15 PM    Wt Readings from Last 3 Encounters:  02/08/19 221 lb 11.2 oz (100.6 kg)  02/10/18 219 lb 4 oz (99.5 kg)  02/01/18 217 lb 12 oz (98.8 kg)     Exam:    Vital Signs: Vital signs may also be detailed in the HPI There were no vitals taken for this visit.  Wt Readings from Last 3 Encounters:  02/08/19 221 lb 11.2 oz (100.6 kg)  02/10/18 219 lb 4 oz (99.5 kg)  02/01/18 217 lb 12 oz (98.8 kg)   Temp Readings from Last 3 Encounters:  02/08/19 98.5 F (36.9 C) (Oral)  02/10/18 98.5 F (36.9 C) (Oral)  02/01/18 99.1 F (37.3 C) (Oral)   BP Readings from Last 3 Encounters:  02/08/19 (!) 160/80  02/10/18 136/70  02/01/18 118/70   Pulse Readings from Last 3 Encounters:  02/08/19 70  02/10/18 74  02/01/18 85    Today BP:130/75 Pulse 70s resp 16  Well nourished, well developed male in no acute distress. Constitutional:  oriented to person, place, and time. No distress.  Head: Normocephalic and atraumatic.  Eyes:  no discharge. No scleral icterus.  Neck: Normal range of motion. Neck supple.  Pulmonary/Chest: No audible wheezing, no distress, appears comfortable Musculoskeletal: Normal range of motion.  no  tenderness or deformity.  Neurological:   Coordination normal. Full exam not performed Skin:  No rash Psychiatric:  normal mood and affect. behavior is normal. Thought content normal.    ASSESSMENT & PLAN:    Coronary artery disease of native artery of native heart with stable angina pectoris Centrilobular emphysema  (HCC) DM (diabetes mellitus) type II, controlled, with peripheral vascular disorder (HCC) Aortic transection, sequela Hyperlipidemia LDL goal <70 Dyspnea, unspecified type  Essential hypertension   Coronary artery disease of native artery of native heart with stable angina pectoris ([place]) Currently with no symptoms of angina. No further workup at this time. Continue current medication regimen.  No stress test needed  Centrilobular emphysema (HCC) Followed by pulmonary, reports symptoms are stable  Last seen by pulmonary 2018  Hyperlipidemia LDL goal <70 Cholesterol is at goal on the current lipid regimen. No changes to the medications were made.  Congratulated him on his good numbers  DM (diabetes mellitus) type II, controlled, with peripheral vascular disorder (HCC) Hemoglobin A1c with slight trend upwards but still in the high 6 range We have encouraged continued exercise, careful diet management in an effort to lose weight.  Aortic transection, sequela  fell off a house 2005   stable post surgery Has had no complications  Shortness of breath Secondary to COPD, deconditioning, obesity Ideally would like him to continue regular walking program   COVID-19 Education: The signs and symptoms of COVID-19 were discussed with the patient and how to seek care for testing (follow up with PCP or arrange E-visit).  The importance of social distancing was discussed today.  Patient Risk:   After full review of this patients clinical status, I feel that they are at least moderate risk at this time.  Time:   Today, I have spent 25 minutes with the patient with telehealth technology discussing the cardiac and medical problems/diagnoses detailed above   Stressed importance of walking program, conditioning, weight loss, modification of his diet, watching for anginal symptoms   Medication Adjustments/Labs and Tests Ordered: Current medicines are reviewed at length with the patient today.   Concerns regarding medicines are outlined above.   Tests Ordered: No tests ordered   Medication Changes: No changes made   Disposition: Follow-up in 12 months   Signed, Ida Rogue, MD  03/09/2019 6:46 PM    [facility] [address] [place] [address], [place], [place] [address]

## 2019-03-10 ENCOUNTER — Other Ambulatory Visit: Payer: Self-pay

## 2019-03-10 ENCOUNTER — Telehealth: Payer: Self-pay

## 2019-03-10 ENCOUNTER — Telehealth (INDEPENDENT_AMBULATORY_CARE_PROVIDER_SITE_OTHER): Payer: PPO | Admitting: Cardiovascular Disease

## 2019-03-10 VITALS — Ht 67.0 in | Wt 220.0 lb

## 2019-03-10 DIAGNOSIS — I25118 Atherosclerotic heart disease of native coronary artery with other forms of angina pectoris: Secondary | ICD-10-CM

## 2019-03-10 DIAGNOSIS — J432 Centrilobular emphysema: Secondary | ICD-10-CM

## 2019-03-10 DIAGNOSIS — E785 Hyperlipidemia, unspecified: Secondary | ICD-10-CM | POA: Diagnosis not present

## 2019-03-10 DIAGNOSIS — S2509XS Other specified injury of thoracic aorta, sequela: Secondary | ICD-10-CM | POA: Diagnosis not present

## 2019-03-10 DIAGNOSIS — I10 Essential (primary) hypertension: Secondary | ICD-10-CM

## 2019-03-10 DIAGNOSIS — R0600 Dyspnea, unspecified: Secondary | ICD-10-CM

## 2019-03-10 DIAGNOSIS — E1151 Type 2 diabetes mellitus with diabetic peripheral angiopathy without gangrene: Secondary | ICD-10-CM | POA: Diagnosis not present

## 2019-03-10 NOTE — Patient Instructions (Signed)

## 2019-03-10 NOTE — Telephone Encounter (Signed)
Spoke with patient.  Obtained vitals that the patient had. (patient had not taken BP yet) Reviewed medications and allergies with patient.  Reviewed what to look for at the time of his appointment. Did a "doxy trial" with patient.  Video and audio worked.

## 2019-03-13 ENCOUNTER — Other Ambulatory Visit: Payer: Self-pay | Admitting: Pulmonary Disease

## 2019-03-14 ENCOUNTER — Other Ambulatory Visit: Payer: Self-pay | Admitting: Pulmonary Disease

## 2019-03-24 ENCOUNTER — Other Ambulatory Visit: Payer: Self-pay | Admitting: Family Medicine

## 2019-03-30 ENCOUNTER — Other Ambulatory Visit: Payer: Self-pay | Admitting: Family Medicine

## 2019-03-30 NOTE — Telephone Encounter (Signed)
[name] notified as instructed by telephone.  Patient is agreeable with stopping the iron.  Refill denied.  Medication list updated.

## 2019-03-30 NOTE — Telephone Encounter (Signed)
Last office visit 02/08/2019 with Lindell Noe for [place].  Last Hgb 15.2 g/dl.  CPE scheduled for 05/24/2019.  Do you want patient to continue iron?

## 2019-03-30 NOTE — Telephone Encounter (Signed)
You can tell [name] with a normal Hgb, he should be able to stop his iron.

## 2019-04-25 ENCOUNTER — Other Ambulatory Visit: Payer: Self-pay | Admitting: Family Medicine

## 2019-05-03 ENCOUNTER — Other Ambulatory Visit: Payer: Self-pay | Admitting: Gastroenterology

## 2019-05-09 ENCOUNTER — Other Ambulatory Visit: Payer: Self-pay | Admitting: Family Medicine

## 2019-05-10 ENCOUNTER — Other Ambulatory Visit: Payer: Self-pay | Admitting: Pulmonary Disease

## 2019-05-24 ENCOUNTER — Encounter: Payer: Self-pay | Admitting: Family Medicine

## 2019-05-24 ENCOUNTER — Ambulatory Visit (INDEPENDENT_AMBULATORY_CARE_PROVIDER_SITE_OTHER): Payer: PPO | Admitting: Family Medicine

## 2019-05-24 VITALS — BP 141/81 | Temp 97.7°F | Ht 67.5 in | Wt 218.0 lb

## 2019-05-24 DIAGNOSIS — Z0000 Encounter for general adult medical examination without abnormal findings: Secondary | ICD-10-CM

## 2019-05-24 NOTE — Progress Notes (Signed)
[facility] T. Annett Boxwell, MD [facility] and [facility] at [facility] [place], [address] Phone: [phone]  FAX: [email or web address] - 73 y.o. male  MRN [id number]  Date of Birth: 28-Jul-1946  Visit Date: 05/24/2019  PCP: [name], MD  Referred by: [name], MD Chief Complaint  Patient presents with  . Annual Exam    Part 2   Virtual Visit via Video Note:  I connected with  Gyan Cambre Mende on 05/24/2019  8:20 AM EDT by a video enabled telemedicine application and verified that I am speaking with the correct person using two identifiers.   Location patient: home computer, tablet, or smartphone Location provider: work or home office Consent: Verbal consent directly obtained from [facility]. Persons participating in the virtual visit: patient, provider  I discussed the limitations of evaluation and management by telemedicine and the availability of in person appointments. The patient expressed understanding and agreed to proceed.  History of Present Illness:  Health Maintenance  Topic Date Due  . FOOT EXAM  07/21/2014  . OPHTHALMOLOGY EXAM  03/01/2019  . INFLUENZA VACCINE  07/01/2019  . HEMOGLOBIN A1C  08/11/2019  . URINE MICROALBUMIN  02/08/2020  . COLONOSCOPY  07/20/2022  . TETANUS/TDAP  06/30/2025  . Hepatitis C Screening  Completed  . PNA vac Low Risk Adult  Completed    Immunization History  Administered Date(s) Administered  . Influenza Split 10/04/2012  . Influenza Whole 08/16/2008, 08/01/2014  . Influenza, High Dose Seasonal PF 09/16/2017  . Influenza,inj,Quad PF,6+ Mos 10/11/2013, 10/18/2015, 08/24/2016, 09/29/2018  . Pneumococcal Conjugate-13 11/18/2015  . Pneumococcal Polysaccharide-23 06/13/2012  . Td 07/01/2015    Diabetes Mellitus: Tolerating Medications: yes Compliance with diet: fair, Body mass index is 33.64 kg/m. Exercise: minimal / intermittent Avg blood sugars at  home: not checking Foot problems: none Hypoglycemia: none No nausea, vomitting, blurred vision, polyuria.  Lab Results  Component Value Date   HGBA1C 6.8 (H) 02/08/2019   HGBA1C 6.7 (H) 02/01/2018   HGBA1C 6.7 (H) 01/27/2017   Lab Results  Component Value Date   MICROALBUR 7.9 (H) 02/08/2019   LDLCALC 72 02/08/2019   CREATININE 1.05 02/08/2019    Wt Readings from Last 3 Encounters:  05/24/19 218 lb (98.9 kg)  03/10/19 220 lb (99.8 kg)  02/08/19 221 lb 11.2 oz (100.6 kg)    Lipids: Doing well, stable. Tolerating meds fine with no SE. Panel reviewed with patient.  Lipids:    Component Value Date/Time   CHOL 127 02/08/2019 1041   CHOL 120 08/10/2014 2128   TRIG 80.0 02/08/2019 1041   TRIG 164 08/10/2014 2128   HDL 39.10 02/08/2019 1041   HDL 29 (L) 08/10/2014 2128   LDLDIRECT 173.5 05/23/2012 1215   VLDL 16.0 02/08/2019 1041   VLDL 33 08/10/2014 2128   CHOLHDL 3 02/08/2019 1041    Lab Results  Component Value Date   ALT 18 02/08/2019   AST 15 02/08/2019   ALKPHOS 88 02/08/2019   BILITOT 1.2 02/08/2019     Review of Systems as above: See pertinent positives and pertinent negatives per HPI No acute distress verbally Otherwise a full 14 point ROS has been performed and is otherwise negative.  Past Medical History, Surgical History, Social History, Family History, Problem List, Medications, and Allergies have been reviewed and updated if relevant.  Patient Active Problem List   Diagnosis Date Noted  . OSA (obstructive sleep apnea) 09/15/2017  .  Coronary artery disease of native artery of native heart with stable angina pectoris ([place]) 08/18/2017  . COPD (chronic obstructive pulmonary disease) ([place]) 05/14/2017  . Radial neck fracture 09/14/2014  . Fracture of femoral neck, left ([place] [place]) 09/14/2014  . Centrilobular emphysema ([place]) 09/12/2014  . Aortic transection   . Peripheral vascular disease ([place])   . History of MI (myocardial infarction)   . Duodenal ulcer  with hemorrhage 04/30/2011  . DM (diabetes mellitus) type II, controlled, with peripheral vascular disorder ([place]) 02/13/2010  . Hyperlipidemia LDL goal <70 08/16/2008  . ERECTILE DYSFUNCTION 08/16/2008  . Major depressive disorder, recurrent, in remission ([place]) 08/16/2008  . Essential hypertension 08/16/2008    Past Medical History:  Diagnosis Date  . Aortic transection 2005   Traumatic after a fall from a second floor. s/p repair at [facility]  . Basal cell carcinoma   . CAD (coronary artery disease) 1992   a. 1992 Acute anterior MI, thrombolytic therapy-->cath reportedly w/o significant CAD; b. 08/2016 MV: EF 57%, hypertensive respons, no ischemia/infarct; c. 04/2017 CTA chest w/ coronary Ca2+.  . Depression   . Diastolic dysfunction    a. 04/2017 Echo: EF 60-65%, Gr1 DD, nl RV fxn.  . Erectile dysfunction   . Fall 2005   fell off house: torn aorta, clavicle fracture, rib fracture, vertebral fractures, lung contusion, coma x 2 weeks  . Fracture of femoral neck, left ([place]) 08/09/2014   ORIF, Dr. Marry Guan  . Fracture of radial neck, left, closed 08/09/2014  . Gallstone pancreatitis   . Gastric ulcer   . History of ATN 08/09/2014   [facility], 2 days of dialysis (ARF)  . Hyperlipidemia    Statin with joint pain   . Hypertension   . Obesity   . Peripheral vascular disease ([place])    Atherosclerotic:R renal artery stenosis  . Type II diabetes mellitus ([place])   . Upper GI bleed    a. 04/2017 admit w/ melena and HGB down to 7 req prbc's; b. 04/2017 EGD: small HH, non-bleeding gastric & duod ulcers, non-bleeding erosive gastropathy-->PPI Rx.    Past Surgical History:  Procedure Laterality Date  . CARDIAC CATHETERIZATION  08/1991   50 % mid-Lad stenosis with clot, 25-505 second marginal  . CARDIOVASCULAR SURGERY     with ruptured Aorta, Dr. Camila Li, [facility]   . CHOLECYSTECTOMY    . ESOPHAGOGASTRODUODENOSCOPY (EGD) WITH PROPOFOL N/A 05/03/2017   Procedure: ESOPHAGOGASTRODUODENOSCOPY (EGD) WITH PROPOFOL;   Surgeon: Lucilla Lame, MD;  Location: [facility] ENDOSCOPY;  Service: Endoscopy;  Laterality: N/A;  . KNEE SURGERY     Right   . ORIF HIP FRACTURE Left 08/12/2014   Dr. Marry Guan  . THORACOTOMY     thoracic aorta repair   . TRACHEOSTOMY     s/p reversal  . VIDEO BRONCHOSCOPY Bilateral 05/27/2017   Procedure: VIDEO BRONCHOSCOPY WITHOUT FLUORO;  Surgeon: Juanito Doom, MD;  Location: [facility] ENDOSCOPY;  Service: Cardiopulmonary;  Laterality: Bilateral;    Social History   Socioeconomic History  . Marital status: Married    Spouse name: Not on file  . Number of children: Not on file  . Years of education: Not on file  . Highest education level: Not on file  Occupational History  . 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 Financial resource strain: Not on file  . Food insecurity    Worry: Not on file    Inability: Not on file  . Transportation needs  Medical: Not on file    Non-medical: Not on file  Tobacco Use  . Smoking status: Former Smoker    Packs/day: 1.50    Years: 20.00    Pack years: 30.00    Types: Cigarettes    Quit date: 09/13/1991    Years since quitting: 27.7  . Smokeless tobacco: Never Used  . Tobacco comment: quit post MI  Substance and Sexual Activity  . Alcohol use: Yes    Comment: beer occasionally   . Drug use: No  . Sexual activity: Not on file  Lifestyle  . Physical activity    Days per week: Not on file    Minutes per session: Not on file  . Stress: Not on file  Relationships  . Social Herbalist on phone: Not on file    Gets together: Not on file    Attends religious service: Not on file    Active member of club or organization: Not on file    Attends meetings of clubs or organizations: Not on file    Relationship status: Not on file  . Intimate partner violence    Fear of current or ex partner: Not on file    Emotionally abused: Not on file    Physically abused: Not on file    Forced sexual  activity: Not on file  Other Topics Concern  . Not on file  Social History Narrative   No regular exercise     Family History  Problem Relation Age of Onset  . Dementia Mother 55  . Heart attack Father 27  . Diabetes Brother 51  . Heart attack Brother   . Diabetes Brother   . Colon cancer Neg Hx   . Stomach cancer Neg Hx   . Esophageal cancer Neg Hx   . Rectal cancer Neg Hx     Allergies  Allergen Reactions  . Contrast Media [Iodinated Diagnostic Agents]     Severe hives  . Xarelto [Rivaroxaban] Other (See Comments)    Reaction: GI Bleed  . Clindamycin/Lincomycin Rash  . Dye Fdc Red [Red Dye] Rash  . Lovenox [Enoxaparin Sodium]     Rash, blackness at injection site. Multifactorial and multiple other drugs used at the same time.  [name] Kitchen Penicillins Rash and Other (See Comments)    Has patient had a PCN reaction causing immediate rash, facial/tongue/throat swelling, SOB or lightheadedness with hypotension: Unknown Has patient had a PCN reaction causing severe rash involving mucus membranes or skin necrosis: Unknown Has patient had a PCN reaction that required hospitalization: Unknown Has patient had a PCN reaction occurring within the last 10 years: No If all of the above answers are "NO", then may proceed with Cephalosporin use.     Medication list reviewed and updated in full in [place].    Observations/Objective/Exam:  An attempt was made to discern vital signs over the phone and per patient if applicable and possible.   General:    Alert, Oriented, appears well and in no acute distress HEENT:     Atraumatic, conjunctiva clear, no obvious abnormalities on inspection of external nose and ears.  Neck:    Normal movements of the head and neck Pulmonary:     On inspection no signs of respiratory distress, breathing rate appears normal, no obvious gross SOB, gasping or wheezing Cardiovascular:    No obvious cyanosis Musculoskeletal:    Moves all visible  extremities without noticeable abnormality Psych / Neurological:  Pleasant and cooperative, no obvious depression or anxiety, speech and thought processing grossly intact  Assessment and Plan:    ICD-10-CM   1. Healthcare maintenance  Z00.00    Doing well, no complaints  HM up to date, imm up to date.  I discussed the assessment and treatment plan with the patient. The patient was provided an opportunity to ask questions and all were answered. The patient agreed with the plan and demonstrated an understanding of the instructions.   The patient was advised to call back or seek an in-person evaluation if the symptoms worsen or if the condition fails to improve as anticipated.  Follow-up: prn unless noted otherwise below No follow-ups on file.  No orders of the defined types were placed in this encounter.  No orders of the defined types were placed in this encounter.   Signed,  Maud Deed. Keshun Berrett, MD

## 2019-09-14 ENCOUNTER — Ambulatory Visit (INDEPENDENT_AMBULATORY_CARE_PROVIDER_SITE_OTHER): Payer: PPO

## 2019-09-14 DIAGNOSIS — Z23 Encounter for immunization: Secondary | ICD-10-CM | POA: Diagnosis not present

## 2019-10-02 ENCOUNTER — Other Ambulatory Visit: Payer: Self-pay | Admitting: Family Medicine

## 2019-11-06 ENCOUNTER — Other Ambulatory Visit: Payer: Self-pay | Admitting: Family Medicine

## 2019-11-20 ENCOUNTER — Other Ambulatory Visit: Payer: Self-pay | Admitting: Family Medicine

## 2019-12-05 ENCOUNTER — Other Ambulatory Visit: Payer: Self-pay | Admitting: Gastroenterology

## 2019-12-06 ENCOUNTER — Telehealth: Payer: Self-pay | Admitting: Gastroenterology

## 2019-12-06 NOTE — Telephone Encounter (Signed)
Patient called to schedule an appointment with Dr Allen Norris. He is scheduled on 12-12-19 and he has requested a refill until he can be seen on  pantoprazole (PROTONIX) 40 MG tablet called into the [facility].

## 2019-12-06 NOTE — Telephone Encounter (Signed)
Rx for Pantoprazole has been sent to pt's pharmacy per request. Pt aware no more refills given until appt.

## 2019-12-06 NOTE — Telephone Encounter (Signed)
Pt left vm regarding  Needing a refill for rx Protonic to [facility] please call pt regarding this

## 2019-12-12 ENCOUNTER — Ambulatory Visit: Payer: PPO | Admitting: Gastroenterology

## 2019-12-12 ENCOUNTER — Other Ambulatory Visit: Payer: Self-pay

## 2019-12-12 ENCOUNTER — Encounter: Payer: Self-pay | Admitting: Gastroenterology

## 2019-12-12 VITALS — BP 155/71 | HR 82 | Temp 98.5°F | Ht 67.5 in | Wt 227.4 lb

## 2019-12-12 DIAGNOSIS — K264 Chronic or unspecified duodenal ulcer with hemorrhage: Secondary | ICD-10-CM | POA: Diagnosis not present

## 2019-12-12 NOTE — Progress Notes (Signed)
Primary Care Physician: [name], MD  Primary Gastroenterologist:  Dr. Lucilla Lame  Chief Complaint  Patient presents with  . Medication Refill    HPI: [name] is a 74 y.o. male here for refill of prescriptions.  The patient reports that he is taking Protonix daily without any symptoms.  The patient had peptic ulcer disease with melena back in 2018 and was started on a PPI.  The patient would like to know if he is able to stop the medication.  Patient denies taking any NSAIDs or having any sign of bleeding.  The patient also denies any heartburn.  Current Outpatient Medications  Medication Sig Dispense Refill  . amLODipine (NORVASC) 5 MG tablet TAKE 1 TABLET BY MOUTH DAILY 90 tablet 1  . aspirin 81 MG chewable tablet Chew 81 mg by mouth daily.    [name] Kitchen atorvastatin (LIPITOR) 40 MG tablet TAKE ONE TABLET BY MOUTH EVERY DAY 90 tablet 3  . busPIRone (BUSPAR) 5 MG tablet TAKE 1 TABLET BY MOUTH 3 TIMES DAILY 60 tablet 0  . citalopram (CELEXA) 10 MG tablet TAKE ONE TABLET BY MOUTH EVERY DAY 90 tablet 1  . metFORMIN (GLUCOPHAGE-XR) 500 MG 24 hr tablet TAKE ONE TABLET BY MOUTH EVERY DAY 90 tablet 1  . nitroGLYCERIN (NITROSTAT) 0.4 MG SL tablet Place 1 tablet (0.4 mg total) under the tongue every 5 (five) minutes as needed for chest pain. 25 tablet 6  . pantoprazole (PROTONIX) 40 MG tablet Take 1 tablet (40 mg total) by mouth daily. 30 tablet 1   No current facility-administered medications for this visit.    Allergies as of 12/12/2019 - Review Complete 12/12/2019  Allergen Reaction Noted  . Contrast media [iodinated diagnostic agents]  05/19/2017  . Xarelto [rivaroxaban] Other (See Comments) 05/02/2017  . Clindamycin/lincomycin Rash 07/05/2015  . Dye fdc red [red dye] Rash 05/24/2012  . Lovenox [enoxaparin sodium]  09/12/2014  . Penicillins Rash and Other (See Comments) 08/16/2008    ROS:  General: Negative for anorexia, weight loss, fever, chills, fatigue, weakness. ENT:  Negative for hoarseness, difficulty swallowing , nasal congestion. CV: Negative for chest pain, angina, palpitations, dyspnea on exertion, peripheral edema.  Respiratory: Negative for dyspnea at rest, dyspnea on exertion, cough, sputum, wheezing.  GI: See history of present illness. GU:  Negative for dysuria, hematuria, urinary incontinence, urinary frequency, nocturnal urination.  Endo: Negative for unusual weight change.    Physical Examination:   BP (!) 155/71   Pulse 82   Temp 98.5 F (36.9 C) (Oral)   Ht 5' 7.5" (1.715 m)   Wt 227 lb 6.4 oz (103.1 kg)   BMI 35.09 kg/m   General: Well-nourished, well-developed in no acute distress.  Eyes: No icterus. Conjunctivae pink. Lungs: Clear to auscultation bilaterally. Non-labored. Heart: Regular rate and rhythm, no murmurs rubs or gallops.  Abdomen: Bowel sounds are normal, nontender, nondistended, no hepatosplenomegaly or masses, no abdominal bruits or hernia , no rebound or guarding.   Extremities: No lower extremity edema. No clubbing or deformities. Neuro: Alert and oriented x 3.  Grossly intact. Skin: Warm and dry, no jaundice.   Psych: Alert and cooperative, normal mood and affect.  Labs:    Imaging Studies: No results found.  Assessment and Plan:   [name] is a 74 y.o. y/o male who comes in today with a history of peptic ulcer disease with melena and was started on Protonix in 2018.  The patient would like to stop the medication  and has been told that he can stop the medication.  The patient will keep an eye out for any sign of GI bleeding or any GI upset.  He has been told that if he has any black stools bloody stools or severe abdominal pain he should go to the ER otherwise if he starts having heartburn off the medication he can start it back up.  The patient has been explained the plan and agrees with it.     Lucilla Lame, MD. Marval Regal    Note: This dictation was prepared with Dragon dictation along with smaller  phrase technology. Any transcriptional errors that result from this process are unintentional.

## 2020-01-08 ENCOUNTER — Ambulatory Visit: Payer: PPO | Attending: Internal Medicine

## 2020-01-08 ENCOUNTER — Other Ambulatory Visit: Payer: Self-pay

## 2020-01-08 DIAGNOSIS — Z23 Encounter for immunization: Secondary | ICD-10-CM | POA: Insufficient documentation

## 2020-01-08 NOTE — Progress Notes (Signed)
   Covid-19 Vaccination Clinic  Name:  [name]    MRN: [id number] DOB: 1946/08/15  01/08/2020  [name] was observed post Covid-19 immunization for 15 minutes without incidence. He was provided with Vaccine Information Sheet and instruction to access the V-Safe system.   [name] was instructed to call 911 with any severe reactions post vaccine: [name] Kitchen Difficulty breathing  . Swelling of your face and throat  . A fast heartbeat  . A bad rash all over your body  . Dizziness and weakness    Immunizations Administered    Name Date Dose VIS Date Route   Pfizer COVID-19 Vaccine 01/08/2020 11:18 AM 0.3 mL 11/10/2019 Intramuscular   Manufacturer: [place]   Lot: [id number]   [place]: [id number]

## 2020-02-01 ENCOUNTER — Ambulatory Visit: Payer: PPO | Attending: Internal Medicine

## 2020-02-01 DIAGNOSIS — Z23 Encounter for immunization: Secondary | ICD-10-CM | POA: Insufficient documentation

## 2020-02-01 NOTE — Progress Notes (Signed)
   Covid-19 Vaccination Clinic  Name:  [name]    MRN: [id number] DOB: 1946-07-25  02/01/2020  Mr. Odor was observed post Covid-19 immunization for 15 minutes without incident. He was provided with Vaccine Information Sheet and instruction to access the V-Safe system.   [name] was instructed to call 911 with any severe reactions post vaccine: [name] Kitchen Difficulty breathing  . Swelling of face and throat  . A fast heartbeat  . A bad rash all over body  . Dizziness and weakness   Immunizations Administered    Name Date Dose VIS Date Route   Pfizer COVID-19 Vaccine 02/01/2020  6:27 PM 0.3 mL 11/10/2019 Intramuscular   Manufacturer: [place]   Lot: [id number]   [place]: [id number]

## 2020-02-14 ENCOUNTER — Ambulatory Visit: Payer: Self-pay

## 2020-02-19 ENCOUNTER — Encounter: Payer: Self-pay | Admitting: Family Medicine

## 2020-03-26 ENCOUNTER — Other Ambulatory Visit: Payer: Self-pay | Admitting: Family Medicine

## 2020-04-13 ENCOUNTER — Other Ambulatory Visit: Payer: Self-pay | Admitting: Family Medicine

## 2020-05-02 ENCOUNTER — Other Ambulatory Visit: Payer: Self-pay | Admitting: Family Medicine

## 2020-05-20 ENCOUNTER — Other Ambulatory Visit: Payer: Self-pay | Admitting: Family Medicine

## 2020-05-20 DIAGNOSIS — H40013 Open angle with borderline findings, low risk, bilateral: Secondary | ICD-10-CM | POA: Diagnosis not present

## 2020-05-20 DIAGNOSIS — E119 Type 2 diabetes mellitus without complications: Secondary | ICD-10-CM | POA: Diagnosis not present

## 2020-05-20 LAB — HM DIABETES EYE EXAM

## 2020-05-21 ENCOUNTER — Encounter: Payer: Self-pay | Admitting: Family Medicine

## 2020-06-04 ENCOUNTER — Ambulatory Visit (INDEPENDENT_AMBULATORY_CARE_PROVIDER_SITE_OTHER): Payer: PPO

## 2020-06-04 ENCOUNTER — Ambulatory Visit: Payer: PPO

## 2020-06-04 ENCOUNTER — Other Ambulatory Visit (INDEPENDENT_AMBULATORY_CARE_PROVIDER_SITE_OTHER): Payer: PPO

## 2020-06-04 DIAGNOSIS — Z0000 Encounter for general adult medical examination without abnormal findings: Secondary | ICD-10-CM

## 2020-06-04 DIAGNOSIS — E1151 Type 2 diabetes mellitus with diabetic peripheral angiopathy without gangrene: Secondary | ICD-10-CM

## 2020-06-04 DIAGNOSIS — Z79899 Other long term (current) drug therapy: Secondary | ICD-10-CM

## 2020-06-04 DIAGNOSIS — Z125 Encounter for screening for malignant neoplasm of prostate: Secondary | ICD-10-CM | POA: Diagnosis not present

## 2020-06-04 DIAGNOSIS — E785 Hyperlipidemia, unspecified: Secondary | ICD-10-CM

## 2020-06-04 LAB — CBC WITH DIFFERENTIAL/PLATELET
Basophils Absolute: 0.1 10*3/uL (ref 0.0–0.1)
Basophils Relative: 1.4 % (ref 0.0–3.0)
Eosinophils Absolute: 0.2 10*3/uL (ref 0.0–0.7)
Eosinophils Relative: 2.8 % (ref 0.0–5.0)
HCT: 44.3 % (ref 39.0–52.0)
Hemoglobin: 15.6 g/dL (ref 13.0–17.0)
Lymphocytes Relative: 22.2 % (ref 12.0–46.0)
Lymphs Abs: 1.7 10*3/uL (ref 0.7–4.0)
MCHC: 35.3 g/dL (ref 30.0–36.0)
MCV: 85.9 fl (ref 78.0–100.0)
Monocytes Absolute: 0.5 10*3/uL (ref 0.1–1.0)
Monocytes Relative: 6.7 % (ref 3.0–12.0)
Neutro Abs: 5.1 10*3/uL (ref 1.4–7.7)
Neutrophils Relative %: 66.9 % (ref 43.0–77.0)
Platelets: 180 10*3/uL (ref 150.0–400.0)
RBC: 5.15 Mil/uL (ref 4.22–5.81)
RDW: 13.3 % (ref 11.5–15.5)
WBC: 7.6 10*3/uL (ref 4.0–10.5)

## 2020-06-04 LAB — BASIC METABOLIC PANEL WITH GFR
BUN: 16 mg/dL (ref 6–23)
CO2: 24 meq/L (ref 19–32)
Calcium: 9.7 mg/dL (ref 8.4–10.5)
Chloride: 106 meq/L (ref 96–112)
Creatinine, Ser: 1.01 mg/dL (ref 0.40–1.50)
GFR: 72.4 mL/min
Glucose, Bld: 139 mg/dL — ABNORMAL HIGH (ref 70–99)
Potassium: 4.6 meq/L (ref 3.5–5.1)
Sodium: 140 meq/L (ref 135–145)

## 2020-06-04 LAB — PSA, MEDICARE: PSA: 1.76 ng/mL (ref 0.10–4.00)

## 2020-06-04 LAB — HEPATIC FUNCTION PANEL
ALT: 20 U/L (ref 0–53)
AST: 15 U/L (ref 0–37)
Albumin: 5 g/dL (ref 3.5–5.2)
Alkaline Phosphatase: 82 U/L (ref 39–117)
Bilirubin, Direct: 0.2 mg/dL (ref 0.0–0.3)
Total Bilirubin: 1.3 mg/dL — ABNORMAL HIGH (ref 0.2–1.2)
Total Protein: 7.1 g/dL (ref 6.0–8.3)

## 2020-06-04 LAB — LIPID PANEL
Cholesterol: 135 mg/dL (ref 0–200)
HDL: 41.2 mg/dL
LDL Cholesterol: 73 mg/dL (ref 0–99)
NonHDL: 93.94
Total CHOL/HDL Ratio: 3
Triglycerides: 106 mg/dL (ref 0.0–149.0)
VLDL: 21.2 mg/dL (ref 0.0–40.0)

## 2020-06-04 LAB — HEMOGLOBIN A1C: Hgb A1c MFr Bld: 6.7 % — ABNORMAL HIGH (ref 4.6–6.5)

## 2020-06-04 NOTE — Progress Notes (Signed)
PCP notes:  Health Maintenance: Shingrix- due Foot exam- due   Abnormal Screenings: none   Patient concerns: none   Nurse concerns: none   Next PCP appt.: 06/05/2020 @ 9 am

## 2020-06-04 NOTE — Patient Instructions (Signed)
[name] , Thank you for taking time to come for your Medicare Wellness Visit. I appreciate your ongoing commitment to your health goals. Please review the following plan we discussed and let me know if I can assist you in the future.   Screening recommendations/referrals: Colonoscopy: Up to date, completed 07/20/2012, due 06/2022 Recommended yearly ophthalmology/optometry visit for glaucoma screening and checkup Recommended yearly dental visit for hygiene and checkup  Vaccinations: Influenza vaccine: Up to date, completed 09/14/2019, due 08/2020 Pneumococcal vaccine: Completed series Tdap vaccine: Up to date, completed 07/01/2015, due 06/2025 Shingles vaccine: due, check with insurance for coverage   Covid-19: Completed series  Advanced directives: Please bring a copy of your POA (Power of Attorney) and/or Living Will to your next appointment.   Conditions/risks identified: diabetes, hypertension, hyperlipidemia  Next appointment: Follow up in one year for your annual wellness visit.   Preventive Care 73 Years and Older, Male Preventive care refers to lifestyle choices and visits with your health care provider that can promote health and wellness. What does preventive care include?  A yearly physical exam. This is also called an annual well check.  Dental exams once or twice a year.  Routine eye exams. Ask your health care provider how often you should have your eyes checked.  Personal lifestyle choices, including:  Daily care of your teeth and gums.  Regular physical activity.  Eating a healthy diet.  Avoiding tobacco and drug use.  Limiting alcohol use.  Practicing safe sex.  Taking low doses of aspirin every day.  Taking vitamin and mineral supplements as recommended by your health care provider. What happens during an annual well check? The services and screenings done by your health care provider during your annual well check will depend on your age, overall health,  lifestyle risk factors, and family history of disease. Counseling  Your health care provider may ask you questions about your:  Alcohol use.  Tobacco use.  Drug use.  Emotional well-being.  Home and relationship well-being.  Sexual activity.  Eating habits.  History of falls.  Memory and ability to understand (cognition).  Work and work Statistician. Screening  You may have the following tests or measurements:  Height, weight, and BMI.  Blood pressure.  Lipid and cholesterol levels. These may be checked every 5 years, or more frequently if you are over 35 years old.  Skin check.  Lung cancer screening. You may have this screening every year starting at age 37 if you have a 30-pack-year history of smoking and currently smoke or have quit within the past 15 years.  Fecal occult blood test (FOBT) of the stool. You may have this test every year starting at age 35.  Flexible sigmoidoscopy or colonoscopy. You may have a sigmoidoscopy every 5 years or a colonoscopy every 10 years starting at age 36.  Prostate cancer screening. Recommendations will vary depending on your family history and other risks.  Hepatitis C blood test.  Hepatitis B blood test.  Sexually transmitted disease (STD) testing.  Diabetes screening. This is done by checking your blood sugar (glucose) after you have not eaten for a while (fasting). You may have this done every 1-3 years.  Abdominal aortic aneurysm (AAA) screening. You may need this if you are a current or former smoker.  Osteoporosis. You may be screened starting at age 32 if you are at high risk. Talk with your health care provider about your test results, treatment options, and if necessary, the need for more tests.  Vaccines  Your health care provider may recommend certain vaccines, such as:  Influenza vaccine. This is recommended every year.  Tetanus, diphtheria, and acellular pertussis (Tdap, Td) vaccine. You may need a Td booster  every 10 years.  Zoster vaccine. You may need this after age 45.  Pneumococcal 13-valent conjugate (PCV13) vaccine. One dose is recommended after age 84.  Pneumococcal polysaccharide (PPSV23) vaccine. One dose is recommended after age 36. Talk to your health care provider about which screenings and vaccines you need and how often you need them. This information is not intended to replace advice given to you by your health care provider. Make sure you discuss any questions you have with your health care provider. Document Released: 12/13/2015 Document Revised: 08/05/2016 Document Reviewed: 09/17/2015 Elsevier Interactive Patient Education  2017 [place] Prevention in the Home Falls can cause injuries. They can happen to people of all ages. There are many things you can do to make your home safe and to help prevent falls. What can I do on the outside of my home?  Regularly fix the edges of walkways and driveways and fix any cracks.  Remove anything that might make you trip as you walk through a door, such as a raised step or threshold.  Trim any bushes or trees on the path to your home.  Use bright outdoor lighting.  Clear any walking paths of anything that might make someone trip, such as rocks or tools.  Regularly check to see if handrails are loose or broken. Make sure that both sides of any steps have handrails.  Any raised decks and porches should have guardrails on the edges.  Have any leaves, snow, or ice cleared regularly.  Use sand or salt on walking paths during winter.  Clean up any spills in your garage right away. This includes oil or grease spills. What can I do in the bathroom?  Use night lights.  Install grab bars by the toilet and in the tub and shower. Do not use towel bars as grab bars.  Use non-skid mats or decals in the tub or shower.  If you need to sit down in the shower, use a plastic, non-slip stool.  Keep the floor dry. Clean up any  water that spills on the floor as soon as it happens.  Remove soap buildup in the tub or shower regularly.  Attach bath mats securely with double-sided non-slip rug tape.  Do not have throw rugs and other things on the floor that can make you trip. What can I do in the bedroom?  Use night lights.  Make sure that you have a light by your bed that is easy to reach.  Do not use any sheets or blankets that are too big for your bed. They should not hang down onto the floor.  Have a firm chair that has side arms. You can use this for support while you get dressed.  Do not have throw rugs and other things on the floor that can make you trip. What can I do in the kitchen?  Clean up any spills right away.  Avoid walking on wet floors.  Keep items that you use a lot in easy-to-reach places.  If you need to reach something above you, use a strong step stool that has a grab bar.  Keep electrical cords out of the [place].  Do not use floor polish or wax that makes floors slippery. If you must use wax, use non-skid floor wax.  Do not have throw rugs and other things on the floor that can make you trip. What can I do with my stairs?  Do not leave any items on the stairs.  Make sure that there are handrails on both sides of the stairs and use them. Fix handrails that are broken or loose. Make sure that handrails are as long as the stairways.  Check any carpeting to make sure that it is firmly attached to the stairs. Fix any carpet that is loose or worn.  Avoid having throw rugs at the top or bottom of the stairs. If you do have throw rugs, attach them to the floor with carpet tape.  Make sure that you have a light switch at the top of the stairs and the bottom of the stairs. If you do not have them, ask someone to add them for you. What else can I do to help prevent falls?  Wear shoes that:  Do not have high heels.  Have rubber bottoms.  Are comfortable and fit you well.  Are closed  at the toe. Do not wear sandals.  If you use a stepladder:  Make sure that it is fully opened. Do not climb a closed stepladder.  Make sure that both sides of the stepladder are locked into place.  Ask someone to hold it for you, if possible.  Clearly mark and make sure that you can see:  Any grab bars or handrails.  First and last steps.  Where the edge of each step is.  Use tools that help you move around (mobility aids) if they are needed. These include:  Canes.  Walkers.  Scooters.  Crutches.  Turn on the lights when you go into a dark area. Replace any light bulbs as soon as they burn out.  Set up your furniture so you have a clear path. Avoid moving your furniture around.  If any of your floors are uneven, fix them.  If there are any pets around you, be aware of where they are.  Review your medicines with your doctor. Some medicines can make you feel dizzy. This can increase your chance of falling. Ask your doctor what other things that you can do to help prevent falls. This information is not intended to replace advice given to you by your health care provider. Make sure you discuss any questions you have with your health care provider. Document Released: 09/12/2009 Document Revised: 04/23/2016 Document Reviewed: 12/21/2014 Elsevier Interactive Patient Education  2017 [employer].

## 2020-06-04 NOTE — Progress Notes (Signed)
Subjective:   [name] is a 74 y.o. male who presents for Medicare Annual/Subsequent preventive examination.  Review of Systems: N/A      I connected with the patient today by telephone and verified that I am speaking with the correct person using two identifiers. Location patient: home Location nurse: work Persons participating in the virtual visit: patient, Marine scientist.   I discussed the limitations, risks, security and privacy concerns of performing an evaluation and management service by telephone and the availability of in person appointments. I also discussed with the patient that there may be a patient responsible charge related to this service. The patient expressed understanding and verbally consented to this telephonic visit.    Interactive audio and video telecommunications were attempted between this nurse and patient, however failed, due to patient having technical difficulties OR patient did not have access to video capability.  We continued and completed visit with audio only.     Cardiac Risk Factors include: advanced age (>68men, >52 women);male gender;hypertension;dyslipidemia;diabetes mellitus     Objective:    Today's Vitals   There is no height or weight on file to calculate BMI.  Advanced Directives 06/04/2020 02/08/2019 02/01/2018 05/14/2017 05/14/2017 05/03/2017 05/02/2017  Does Patient Have a Medical Advance Directive? Yes Yes Yes Yes No;Yes Yes Yes  Type of Paramedic of [place];Living will [place];Living will [place];Living will Healthcare Power of [place] -  Does patient want to make changes to medical advance directive? - - - No - Patient declined - No - Patient declined -  Copy of [place] in Chart? No - copy requested No - copy requested No - copy requested Yes - No - copy requested -    Current Medications (verified) Outpatient Encounter  Medications as of 06/04/2020  Medication Sig   amLODipine (NORVASC) 5 MG tablet TAKE ONE TABLET EVERY DAY   aspirin 81 MG chewable tablet Chew 81 mg by mouth daily.   atorvastatin (LIPITOR) 40 MG tablet TAKE ONE TABLET BY MOUTH EVERY DAY   citalopram (CELEXA) 10 MG tablet TAKE ONE TABLET BY MOUTH EVERY DAY   metFORMIN (GLUCOPHAGE-XR) 500 MG 24 hr tablet TAKE 1 TABLET BY MOUTH DAILY   nitroGLYCERIN (NITROSTAT) 0.4 MG SL tablet Place 1 tablet (0.4 mg total) under the tongue every 5 (five) minutes as needed for chest pain.   busPIRone (BUSPAR) 5 MG tablet TAKE 1 TABLET BY MOUTH 3 TIMES DAILY   pantoprazole (PROTONIX) 40 MG tablet Take 1 tablet (40 mg total) by mouth daily.   No facility-administered encounter medications on file as of 06/04/2020.    Allergies (verified) Contrast media [iodinated diagnostic agents], Xarelto [rivaroxaban], Clindamycin/lincomycin, Dye fdc red [red dye], Lovenox [enoxaparin sodium], and Penicillins   History: Past Medical History:  Diagnosis Date   Aortic transection 2005   Traumatic after a fall from a second floor. s/p repair at [facility]   Basal cell carcinoma    CAD (coronary artery disease) 1992   a. 1992 Acute anterior MI, thrombolytic therapy-->cath reportedly w/o significant CAD; b. 08/2016 MV: EF 57%, hypertensive respons, no ischemia/infarct; c. 04/2017 CTA chest w/ coronary Ca2+.   Depression    Diastolic dysfunction    a. 04/2017 Echo: EF 60-65%, Gr1 DD, nl RV fxn.   Erectile dysfunction    Fall 2005   fell off house: torn aorta, clavicle fracture, rib fracture, vertebral fractures, lung contusion, coma x 2 weeks  Fracture of femoral neck, left ( [place]) 08/09/2014   ORIF, Dr. Marry Guan   Fracture of radial neck, left, closed 08/09/2014   Gallstone pancreatitis    Gastric ulcer    History of ATN 08/09/2014   [facility], 2 days of dialysis (ARF)   Hyperlipidemia    Statin with joint pain    Hypertension    Obesity    Peripheral vascular  disease ([place])    Atherosclerotic:R renal artery stenosis   Type II diabetes mellitus ([place])    Upper GI bleed    a. 04/2017 admit w/ melena and HGB down to 7 req prbc's; b. 04/2017 EGD: small HH, non-bleeding gastric & duod ulcers, non-bleeding erosive gastropathy-->PPI Rx.   Past Surgical History:  Procedure Laterality Date   CARDIAC CATHETERIZATION  08/1991   50 % mid-Lad stenosis with clot, 25-505 second marginal   CARDIOVASCULAR SURGERY     with ruptured Aorta, Dr. Camila Li, [place]     ESOPHAGOGASTRODUODENOSCOPY (EGD) WITH PROPOFOL N/A 05/03/2017   Procedure: ESOPHAGOGASTRODUODENOSCOPY (EGD) WITH PROPOFOL;  Surgeon: Lucilla Lame, MD;  Location: [facility] ENDOSCOPY;  Service: Endoscopy;  Laterality: N/A;   KNEE SURGERY     Right    ORIF HIP FRACTURE Left 08/12/2014   Dr. Marry Guan   THORACOTOMY     thoracic aorta repair    TRACHEOSTOMY     s/p reversal   VIDEO BRONCHOSCOPY Bilateral 05/27/2017   Procedure: VIDEO BRONCHOSCOPY WITHOUT FLUORO;  Surgeon: Juanito Doom, MD;  Location: [facility] ENDOSCOPY;  Service: Cardiopulmonary;  Laterality: Bilateral;   Family History  Problem Relation Age of Onset   Dementia Mother 61   Heart attack Father 33   Diabetes Brother 41   Heart attack Brother    Diabetes Brother    Colon cancer Neg Hx    Stomach cancer Neg Hx    Esophageal cancer Neg Hx    Rectal cancer Neg Hx    Social History   Socioeconomic History   Marital status: Married    Spouse name: Not on file   Number of children: Not on file   Years of education: Not on file   Highest education level: Not on file  Occupational History   Occupation: Build in Production manager: self employed  Tobacco Use   Smoking status: Former Smoker    Packs/day: 1.50    Years: 20.00    Pack years: 30.00    Types: Cigarettes    Quit date: 09/13/1991    Years since quitting: 28.7   Smokeless tobacco: Never Used   Tobacco comment: quit post MI    Vaping Use   Vaping Use: Never used  Substance and Sexual Activity   Alcohol use: Yes    Comment: beer occasionally    Drug use: No   Sexual activity: Not on file  Other Topics Concern   Not on file  Social History Narrative   No regular exercise    Social Determinants of Health   Financial Resource Strain: Low Risk    Difficulty of Paying Living Expenses: Not hard at all  Food Insecurity: No Food Insecurity   Worried About Charity fundraiser in the Last Year: Never true   [place] in the Last Year: Never true  Transportation Needs: No Transportation Needs   Lack of Transportation (Medical): No   Lack of Transportation (Non-Medical): No  Physical Activity: Inactive   Days of Exercise per Week: 0 days   Minutes  of Exercise per Session: 0 min  Stress: No Stress Concern Present   Feeling of Stress : Not at all  Social Connections:    Frequency of Communication with Friends and Family:    Frequency of Social Gatherings with Friends and Family:    Attends Religious Services:    Active Member of Clubs or Organizations:    Attends Music therapist:    Marital Status:     Tobacco Counseling Counseling given: Not Answered Comment: quit post MI   Clinical Intake:  Pre-visit preparation completed: Yes  Pain : No/denies pain     Nutritional Risks: None Diabetes: Yes CBG done?: No Did pt. bring in CBG monitor from home?: No  How often do you need to have someone help you when you read instructions, pamphlets, or other written materials from your doctor or pharmacy?: 1 - Never What is the last grade level you completed in school?: associates  Diabetic: Yes Nutrition Risk Assessment:  Has the patient had any N/V/D within the last 2 months?  No  Does the patient have any non-healing wounds?  No  Has the patient had any unintentional weight loss or weight gain?  No   Diabetes:  Is the patient diabetic?  Yes  If diabetic, was  a CBG obtained today?  No  Did the patient bring in their glucometer from home?  No  How often do you monitor your CBG's? as needed.   Financial Strains and Diabetes Management:  Are you having any financial strains with the device, your supplies or your medication? No .  Does the patient want to be seen by Chronic Care Management for management of their diabetes?  No  Would the patient like to be referred to a Nutritionist or for Diabetic Management?  No   Diabetic Exams:  Diabetic Eye Exam: Completed 05/20/2020 Diabetic Foot Exam: Overdue, Pt has been advised about the importance in completing this exam. Pt is scheduled for diabetic foot exam on 06/05/2020.   Interpreter Needed?: No  Information entered by :: CJohnson, LPN   Activities of Daily Living In your present state of health, do you have any difficulty performing the following activities: 06/04/2020  Hearing? N  Vision? N  Difficulty concentrating or making decisions? N  Walking or climbing stairs? N  Dressing or bathing? N  Doing errands, shopping? N  Preparing Food and eating ? N  Using the Toilet? N  In the past six months, have you accidently leaked urine? N  Do you have problems with loss of bowel control? N  Managing your Medications? N  Managing your Finances? N  Housekeeping or managing your Housekeeping? N  Some recent data might be hidden    Patient Care Team: [name], MD as PCP - General Sydnee Levans, MD as Consulting Physician (Dermatology)  Indicate any recent Medical Services you may have received from other than [facility] providers in the past year (date may be approximate).     Assessment:   This is a routine wellness examination for [name].  Hearing/Vision screen  Hearing Screening   125Hz  250Hz  500Hz  1000Hz  2000Hz  3000Hz  4000Hz  6000Hz  8000Hz   Right ear:           Left ear:           Vision Screening Comments: Patient gets annual eye exams  Dietary issues and exercise activities  discussed: Current Exercise Habits: The patient does not participate in regular exercise at present, Exercise limited by: None identified  Goals  DIET - INCREASE WATER INTAKE     Starting 02/08/2019, I will continue to drink at least 1 gallon of water daily.      Patient Stated     06/04/2020, I will maintain and continue medications as prescribed.       Depression Screen PHQ 2/9 Scores 06/04/2020 02/08/2019 02/01/2018 01/27/2017 11/18/2015 07/20/2013  PHQ - 2 Score 0 0 0 0 0 0  PHQ- 9 Score 0 0 0 - - -    Fall Risk Fall Risk  06/04/2020 02/08/2019 02/01/2018 01/27/2017 11/18/2015  Falls in the past year? 0 0 No No No  Number falls in past yr: 0 - - - -  Injury with Fall? 0 - - - -  Risk for fall due to : Medication side effect - - - -  Follow up Falls evaluation completed;Falls prevention discussed - - - -    Any stairs in or around the home? Yes  If so, are there any without handrails? No  Home free of loose throw rugs in walkways, pet beds, electrical cords, etc? Yes  Adequate lighting in your home to reduce risk of falls? Yes   ASSISTIVE DEVICES UTILIZED TO PREVENT FALLS:  Life alert? No  Use of a cane, walker or w/c? No  Grab bars in the bathroom? No  Shower chair or bench in shower? No  Elevated toilet seat or a handicapped toilet? No   TIMED UP AND GO:  Was the test performed? N/A, telephonic visit.    Cognitive Function: MMSE - Mini Mental State Exam 06/04/2020 02/08/2019 02/01/2018  Orientation to time 5 5 5   Orientation to Place 5 5 5   Registration 3 3 3   Attention/ Calculation 5 0 0  Recall 3 3 3   Language- name 2 objects - 0 0  Language- repeat 1 1 1   Language- follow 3 step command - 3 2  Language- follow 3 step command-comments - - unable to follow 1 step of 3 step command  Language- read & follow direction - 0 0  Write a sentence - 0 0  Copy design - 0 0  Total score - 20 19  Mini Cog  Mini-Cog screen was completed. Maximum score is 22. A value of 0 denotes  this part of the MMSE was not completed or the patient failed this part of the Mini-Cog screening.       Immunizations Immunization History  Administered Date(s) Administered   Fluad Quad(high Dose 65+) 09/14/2019   Influenza Split 10/04/2012   Influenza Whole 08/16/2008, 08/01/2014   Influenza, High Dose Seasonal PF 09/16/2017   Influenza,inj,Quad PF,6+ Mos 10/11/2013, 10/18/2015, 08/24/2016, 09/29/2018   PFIZER SARS-COV-2 Vaccination 01/08/2020, 02/01/2020   Pneumococcal Conjugate-13 11/18/2015   Pneumococcal Polysaccharide-23 06/13/2012   Td 07/01/2015    TDAP status: Up to date Flu Vaccine status: Up to date Pneumococcal vaccine status: Up to date Covid-19 vaccine status: Completed vaccines  Qualifies for Shingles Vaccine? Yes   Zostavax completed No   Shingrix Completed?: No.    Education has been provided regarding the importance of this vaccine. Patient has been advised to call insurance company to determine out of pocket expense if they have not yet received this vaccine. Advised may also receive vaccine at local pharmacy or Health Dept. Verbalized acceptance and understanding.  Screening Tests Health Maintenance  Topic Date Due   FOOT EXAM  07/21/2014   HEMOGLOBIN A1C  08/11/2019   URINE MICROALBUMIN  02/08/2020   INFLUENZA VACCINE  06/30/2020  OPHTHALMOLOGY EXAM  05/20/2021   COLONOSCOPY  07/20/2022   TETANUS/TDAP  06/30/2025   COVID-19 Vaccine  Completed   Hepatitis C Screening  Completed   PNA vac Low Risk Adult  Completed    Health Maintenance  Health Maintenance Due  Topic Date Due   FOOT EXAM  07/21/2014   HEMOGLOBIN A1C  08/11/2019   URINE MICROALBUMIN  02/08/2020    Colorectal cancer screening: Completed 07/20/2012. Repeat every 10 years  Lung Cancer Screening: (Low Dose CT Chest recommended if Age 57-80 years, 30 pack-year currently smoking OR have quit w/in 15 years.) does not qualify.    Additional  Screening:  Hepatitis C Screening: does qualify; Completed 01/27/2017  Vision Screening: Recommended annual ophthalmology exams for early detection of glaucoma and other disorders of the eye. Is the patient up to date with their annual eye exam?  Yes  Who is the provider or what is the name of the office in which the patient attends annual eye exams? Dr. Ellin Mayhew If pt is not established with a provider, would they like to be referred to a provider to establish care? No .   Dental Screening: Recommended annual dental exams for proper oral hygiene  Community Resource Referral / Chronic Care Management: CRR required this visit?  No   CCM required this visit?  No      Plan:     I have personally reviewed and noted the following in the patients chart:    Medical and social history  Use of alcohol, tobacco or illicit drugs   Current medications and supplements  Functional ability and status  Nutritional status  Physical activity  Advanced directives  List of other physicians  Hospitalizations, surgeries, and ER visits in previous 12 months  Vitals  Screenings to include cognitive, depression, and falls  Referrals and appointments  In addition, I have reviewed and discussed with patient certain preventive protocols, quality metrics, and best practice recommendations. A written personalized care plan for preventive services as well as general preventive health recommendations were provided to patient.   Due to this being a telephonic visit, the after visit summary with patients personalized plan was offered to patient via mail or my-chart.  Patient preferred to pick up at office at next visit.    Andrez Grime, LPN   05/05/5992

## 2020-06-05 ENCOUNTER — Encounter: Payer: Self-pay | Admitting: Family Medicine

## 2020-06-05 ENCOUNTER — Other Ambulatory Visit: Payer: Self-pay

## 2020-06-05 ENCOUNTER — Ambulatory Visit (INDEPENDENT_AMBULATORY_CARE_PROVIDER_SITE_OTHER): Payer: PPO | Admitting: Family Medicine

## 2020-06-05 VITALS — BP 160/80 | HR 75 | Temp 98.5°F | Ht 68.0 in | Wt 229.5 lb

## 2020-06-05 DIAGNOSIS — Z0000 Encounter for general adult medical examination without abnormal findings: Secondary | ICD-10-CM | POA: Diagnosis not present

## 2020-06-05 DIAGNOSIS — E1151 Type 2 diabetes mellitus with diabetic peripheral angiopathy without gangrene: Secondary | ICD-10-CM

## 2020-06-05 LAB — MICROALBUMIN / CREATININE URINE RATIO
Creatinine,U: 109.6 mg/dL
Microalb Creat Ratio: 5.7 mg/g (ref 0.0–30.0)
Microalb, Ur: 6.2 mg/dL — ABNORMAL HIGH (ref 0.0–1.9)

## 2020-06-05 MED ORDER — AMLODIPINE BESYLATE 10 MG PO TABS: 10.0000 mg | ORAL_TABLET | Freq: Every day | ORAL | 1 refills | Status: DC

## 2020-06-05 NOTE — Progress Notes (Signed)
Colton Pfost T. Consuello Lassalle, MD, [place] at [facility] [place], [address]  Phone: [phone]  FAX: [name] - 74 y.o. male  MRN [id number]  Date of Birth: Oct 18, 1946  Date: 06/05/2020  PCP: [name], MD  Referral: [name], MD  Chief Complaint  Patient presents with  . Annual Exam    Part 2    This visit occurred during the SARS-CoV-2 public health emergency.  Safety protocols were in place, including screening questions prior to the visit, additional usage of staff PPE, and extensive cleaning of exam room while observing appropriate contact time as indicated for disinfecting solutions.   Patient Care Team: [name], MD as PCP - General Sydnee Levans, MD as Consulting Physician (Dermatology) Subjective:   [name] is a 74 y.o. pleasant patient who presents with the following:  Preventative Health Maintenance Visit:  Health Maintenance Summary Reviewed and updated, unless pt declines services.  Tobacco History Reviewed. Alcohol: No concerns, no excessive use Exercise Habits: Some activity, rec at least 30 mins 5 times a week - walking some.  STD concerns: no risk or activity to increase risk Drug Use: None  Shingrix - needs to get  BP is high at home.   BP Readings from Last 3 Encounters:  06/05/20 (!) 160/80  12/12/19 (!) 155/71  05/24/19 (!) 141/81     Wt Readings from Last 3 Encounters:  06/05/20 229 lb 8 oz (104.1 kg)  12/12/19 227 lb 6.4 oz (103.1 kg)  05/24/19 218 lb (98.9 kg)      Health Maintenance  Topic Date Due  . FOOT EXAM  07/21/2014  . URINE MICROALBUMIN  02/08/2020  . INFLUENZA VACCINE  06/30/2020  . HEMOGLOBIN A1C  12/05/2020  . OPHTHALMOLOGY EXAM  05/20/2021  . COLONOSCOPY  07/20/2022  . TETANUS/TDAP  06/30/2025  . COVID-19 Vaccine  Completed  . Hepatitis C Screening  Completed  . PNA vac  Low Risk Adult  Completed   Immunization History  Administered Date(s) Administered  . Fluad Quad(high Dose 65+) 09/14/2019  . Influenza Split 10/04/2012  . Influenza Whole 08/16/2008, 08/01/2014  . Influenza, High Dose Seasonal PF 09/16/2017  . Influenza,inj,Quad PF,6+ Mos 10/11/2013, 10/18/2015, 08/24/2016, 09/29/2018  . PFIZER SARS-COV-2 Vaccination 01/08/2020, 02/01/2020  . Pneumococcal Conjugate-13 11/18/2015  . Pneumococcal Polysaccharide-23 06/13/2012  . Td 07/01/2015   Patient Active Problem List   Diagnosis Date Noted  . OSA (obstructive sleep apnea) 09/15/2017  . Coronary artery disease of native artery of native heart with stable angina pectoris ([place]) 08/18/2017  . COPD (chronic obstructive pulmonary disease) ([place]) 05/14/2017  . Radial neck fracture 09/14/2014  . Fracture of femoral neck, left ([place]) 09/14/2014  . Centrilobular emphysema ([place]) 09/12/2014  . Aortic transection   . Peripheral vascular disease ([place])   . History of MI (myocardial infarction)   . Duodenal ulcer with hemorrhage 04/30/2011  . DM (diabetes mellitus) type II, controlled, with peripheral vascular disorder ([name]) 02/13/2010  . Hyperlipidemia LDL goal <70 08/16/2008  . ERECTILE DYSFUNCTION 08/16/2008  . Major depressive disorder, recurrent, in remission ([name]) 08/16/2008  . Essential hypertension 08/16/2008    Past Medical History:  Diagnosis Date  . Aortic transection 2005   Traumatic after a fall from a second floor. s/p repair at [facility]  . Basal cell carcinoma   . CAD (coronary artery disease) 1992   a. 1992 Acute anterior MI, thrombolytic  therapy-->cath reportedly w/o significant CAD; b. 08/2016 MV: EF 57%, hypertensive respons, no ischemia/infarct; c. 04/2017 CTA chest w/ coronary Ca2+.  . Depression   . Diastolic dysfunction    a. 04/2017 Echo: EF 60-65%, Gr1 DD, nl RV fxn.  . Erectile dysfunction   . Fall 2005   fell off house: torn aorta, clavicle fracture, rib fracture, vertebral  fractures, lung contusion, coma x 2 weeks  . Fracture of femoral neck, left ([name]) 08/09/2014   ORIF, Dr. Marry Guan  . Fracture of radial neck, left, closed 08/09/2014  . Gallstone pancreatitis   . Gastric ulcer   . History of ATN 08/09/2014   [facility], 2 days of dialysis (ARF)  . Hyperlipidemia    Statin with joint pain   . Hypertension   . Obesity   . Peripheral vascular disease ([place])    Atherosclerotic:R renal artery stenosis  . Type II diabetes mellitus ([name])   . Upper GI bleed    a. 04/2017 admit w/ melena and HGB down to 7 req prbc's; b. 04/2017 EGD: small HH, non-bleeding gastric & duod ulcers, non-bleeding erosive gastropathy-->PPI Rx.    Past Surgical History:  Procedure Laterality Date  . CARDIAC CATHETERIZATION  08/1991   50 % mid-Lad stenosis with clot, 25-505 second marginal  . CARDIOVASCULAR SURGERY     with ruptured Aorta, Dr. Camila Li, [name]   . CHOLECYSTECTOMY    . ESOPHAGOGASTRODUODENOSCOPY (EGD) WITH PROPOFOL N/A 05/03/2017   Procedure: ESOPHAGOGASTRODUODENOSCOPY (EGD) WITH PROPOFOL;  Surgeon: Lucilla Lame, MD;  [facility];  Service: Endoscopy;  Laterality: N/A;  . KNEE SURGERY     Right   . ORIF HIP FRACTURE Left 08/12/2014   Dr. Marry Guan  . THORACOTOMY     thoracic aorta repair   . TRACHEOSTOMY     s/p reversal  . VIDEO BRONCHOSCOPY Bilateral 05/27/2017   Procedure: VIDEO BRONCHOSCOPY WITHOUT FLUORO;  Surgeon: Juanito Doom, MD;  Location: [facility] ENDOSCOPY;  Service: Cardiopulmonary;  Laterality: Bilateral;    Family History  Problem Relation Age of Onset  . Dementia Mother 66  . Heart attack Father 59  . Diabetes Brother 38  . Heart attack Brother   . Diabetes Brother   . Colon cancer Neg Hx   . Stomach cancer Neg Hx   . Esophageal cancer Neg Hx   . Rectal cancer Neg Hx     Past Medical History, Surgical History, Social History, Family History, Problem List, Medications, and Allergies have been reviewed and updated if relevant.  Review of  Systems: Pertinent positives are listed above.  Otherwise, a full 14 point review of systems has been done in full and it is negative except where it is noted positive.  Objective:   BP (!) 160/80   Pulse 75   Temp 98.5 F (36.9 C) (Temporal)   Ht 5\' 8"  (1.727 m)   Wt 229 lb 8 oz (104.1 kg)   SpO2 91%   BMI 34.90 kg/m  Ideal Body Weight: Weight in (lb) to have BMI = 25: 164.1  Ideal Body Weight: Weight in (lb) to have BMI = 25: 164.1 No exam data present Depression screen [facility] 2/9 06/04/2020 02/08/2019 02/01/2018 01/27/2017 11/18/2015  Decreased Interest 0 0 0 0 0  Down, Depressed, Hopeless 0 0 0 0 0  PHQ - 2 Score 0 0 0 0 0  Altered sleeping 0 0 0 - -  Tired, decreased energy 0 0 0 - -  Change in appetite 0 0 0 - -  Feeling bad or failure about yourself  0 0 0 - -  Trouble concentrating 0 0 0 - -  Moving slowly or fidgety/restless 0 0 0 - -  Suicidal thoughts 0 0 0 - -  PHQ-9 Score 0 0 0 - -  Difficult doing work/chores Not difficult at all Not difficult at all Not difficult at all - -     GEN: well developed, well nourished, no acute distress Eyes: conjunctiva and lids normal, PERRLA, EOMI ENT: TM clear, nares clear, oral exam WNL Neck: supple, no lymphadenopathy, no thyromegaly, no JVD Pulm: clear to auscultation and percussion, respiratory effort normal CV: regular rate and rhythm, S1-S2, no murmur, rub or gallop, no bruits, peripheral pulses normal and symmetric, no cyanosis, clubbing, edema or varicosities GI: soft, non-tender; no hepatosplenomegaly, masses; active bowel sounds all quadrants GU: no hernia, testicular mass, penile discharge Lymph: no cervical, axillary or inguinal adenopathy MSK: gait normal, muscle tone and strength WNL, no joint swelling, effusions, discoloration, crepitus  SKIN: clear, good turgor, color WNL, no rashes, lesions, or ulcerations Neuro: normal mental status, normal strength, sensation, and motion Psych: alert; oriented to person, place and  time, normally interactive and not anxious or depressed in appearance.  All labs reviewed with patient. Results for orders placed or performed in visit on 06/04/20  Lipid panel  Result Value Ref Range   Cholesterol 135 0 - 200 mg/dL   Triglycerides 106.0 0 - 149 mg/dL   HDL 41.20 >39.00 mg/dL   VLDL 21.2 0.0 - 40.0 mg/dL   LDL Cholesterol 73 0 - 99 mg/dL   Total CHOL/HDL Ratio 3    NonHDL 93.94   Hepatic function panel  Result Value Ref Range   Total Bilirubin 1.3 (H) 0.2 - 1.2 mg/dL   Bilirubin, Direct 0.2 0.0 - 0.3 mg/dL   Alkaline Phosphatase 82 39 - 117 U/L   AST 15 0 - 37 U/L   ALT 20 0 - 53 U/L   Total Protein 7.1 6.0 - 8.3 g/dL   Albumin 5.0 3.5 - 5.2 g/dL  Basic metabolic panel  Result Value Ref Range   Sodium 140 135 - 145 mEq/L   Potassium 4.6 3.5 - 5.1 mEq/L   Chloride 106 96 - 112 mEq/L   CO2 24 19 - 32 mEq/L   Glucose, Bld 139 (H) 70 - 99 mg/dL   BUN 16 6 - 23 mg/dL   Creatinine, Ser 1.01 0.40 - 1.50 mg/dL   GFR 72.40 >60.00 mL/min   Calcium 9.7 8.4 - 10.5 mg/dL  CBC with Differential/Platelet  Result Value Ref Range   WBC 7.6 4.0 - 10.5 K/uL   RBC 5.15 4.22 - 5.81 Mil/uL   Hemoglobin 15.6 13.0 - 17.0 g/dL   HCT 44.3 39 - 52 %   MCV 85.9 78.0 - 100.0 fl   MCHC 35.3 30.0 - 36.0 g/dL   RDW 13.3 11.5 - 15.5 %   Platelets 180.0 150 - 400 K/uL   Neutrophils Relative % 66.9 43 - 77 %   Lymphocytes Relative 22.2 12 - 46 %   Monocytes Relative 6.7 3 - 12 %   Eosinophils Relative 2.8 0 - 5 %   Basophils Relative 1.4 0 - 3 %   Neutro Abs 5.1 1.4 - 7.7 K/uL   Lymphs Abs 1.7 0.7 - 4.0 K/uL   Monocytes Absolute 0.5 0 - 1 K/uL   Eosinophils Absolute 0.2 0 - 0 K/uL   Basophils Absolute 0.1 0 - 0  K/uL  Hemoglobin A1c  Result Value Ref Range   Hgb A1c MFr Bld 6.7 (H) 4.6 - 6.5 %  PSA, Medicare  Result Value Ref Range   PSA 1.76 0.10 - 4.00 ng/ml    Assessment and Plan:     ICD-10-CM   1. Healthcare maintenance  Z00.00   2. DM (diabetes mellitus) type II,  controlled, with peripheral vascular disorder (HCC)  E11.51 Microalbumin / creatinine urine ratio   Increase to Norvasc 10 mg Goal BP: 120/80 Check at home, if not at goal in about 2 months, f/u  Work on walking, weight Shingrix vacc recommended  Health Maintenance Exam: The patient's preventative maintenance and recommended screening tests for an annual wellness exam were reviewed in full today. Brought up to date unless services declined.  Counselled on the importance of diet, exercise, and its role in overall health and mortality. The patient's FH and SH was reviewed, including their home life, tobacco status, and drug and alcohol status.  Follow-up in 1 year for physical exam or additional follow-up below.  Follow-up: No follow-ups on file. Or follow-up in 1 year if not noted.  Meds ordered this encounter  Medications  . amLODipine (NORVASC) 10 MG tablet    Sig: Take 1 tablet (10 mg total) by mouth daily.    Dispense:  180 tablet    Refill:  1   Medications Discontinued During This Encounter  Medication Reason  . busPIRone (BUSPAR) 5 MG tablet Completed Course  . pantoprazole (PROTONIX) 40 MG tablet Completed Course  . amLODipine (NORVASC) 5 MG tablet    No orders of the defined types were placed in this encounter.   Signed,  Maud Deed. Seung Nidiffer, MD   Allergies as of 06/05/2020      Reactions   Contrast Media [iodinated Diagnostic Agents]    Severe hives   Xarelto [rivaroxaban] Other (See Comments)   Reaction: GI Bleed   Clindamycin/lincomycin Rash   Dye Fdc Red [red Dye] Rash   Lovenox [enoxaparin Sodium]    Rash, blackness at injection site. Multifactorial and multiple other drugs used at the same time.   Penicillins Rash, Other (See Comments)   Has patient had a PCN reaction causing immediate rash, facial/tongue/throat swelling, SOB or lightheadedness with hypotension: Unknown Has patient had a PCN reaction causing severe rash involving mucus membranes or skin  necrosis: Unknown Has patient had a PCN reaction that required hospitalization: Unknown Has patient had a PCN reaction occurring within the last 10 years: No If all of the above answers are "NO", then may proceed with Cephalosporin use.      Medication List       Accurate as of June 05, 2020  9:39 AM. If you have any questions, ask your nurse or doctor.        STOP taking these medications   busPIRone 5 MG tablet Commonly known as: BUSPAR Stopped by: [name], MD   pantoprazole 40 MG tablet Commonly known as: PROTONIX Stopped by: [name], MD     TAKE these medications   amLODipine 10 MG tablet Commonly known as: NORVASC Take 1 tablet (10 mg total) by mouth daily. What changed:   medication strength  how much to take Changed by: [name], MD   aspirin 81 MG chewable tablet Chew 81 mg by mouth daily.   atorvastatin 40 MG tablet Commonly known as: LIPITOR TAKE ONE TABLET BY MOUTH EVERY DAY   citalopram 10 MG tablet Commonly known as: CELEXA TAKE  ONE TABLET BY MOUTH EVERY DAY   metFORMIN 500 MG 24 hr tablet Commonly known as: GLUCOPHAGE-XR TAKE 1 TABLET BY MOUTH DAILY   nitroGLYCERIN 0.4 MG SL tablet Commonly known as: NITROSTAT Place 1 tablet (0.4 mg total) under the tongue every 5 (five) minutes as needed for chest pain.

## 2020-06-05 NOTE — Patient Instructions (Signed)
Check with your insurance to see if they will cover the shingles shot.  The newer [facility] shot is much better than the older shot. The [facility] shot requires 2 shots given 6 months apart.  Almost always, this shot is not covered in our office by your insurance. It costs 500 dollars, but essentially all insurances cover it at your pharmacy.  I would call your insurance number on your card to confirm this.

## 2020-06-25 ENCOUNTER — Other Ambulatory Visit: Payer: Self-pay | Admitting: Family Medicine

## 2020-07-16 ENCOUNTER — Other Ambulatory Visit: Payer: Self-pay | Admitting: Family Medicine

## 2020-08-19 ENCOUNTER — Other Ambulatory Visit: Payer: Self-pay | Admitting: Family Medicine

## 2020-09-23 ENCOUNTER — Other Ambulatory Visit: Payer: Self-pay | Admitting: Family Medicine

## 2020-11-20 ENCOUNTER — Encounter: Payer: Self-pay | Admitting: Emergency Medicine

## 2020-11-20 ENCOUNTER — Telehealth: Payer: Self-pay

## 2020-11-20 ENCOUNTER — Emergency Department
Admission: EM | Admit: 2020-11-20 | Discharge: 2020-11-20 | Disposition: A | Discharge status: Home / Self Care | Payer: PPO | Attending: Emergency Medicine | Admitting: Emergency Medicine

## 2020-11-20 ENCOUNTER — Other Ambulatory Visit: Payer: Self-pay

## 2020-11-20 DIAGNOSIS — Z7982 Long term (current) use of aspirin: Secondary | ICD-10-CM | POA: Diagnosis not present

## 2020-11-20 DIAGNOSIS — E1169 Type 2 diabetes mellitus with other specified complication: Secondary | ICD-10-CM | POA: Diagnosis not present

## 2020-11-20 DIAGNOSIS — Z7901 Long term (current) use of anticoagulants: Secondary | ICD-10-CM | POA: Diagnosis not present

## 2020-11-20 DIAGNOSIS — R21 Rash and other nonspecific skin eruption: Secondary | ICD-10-CM | POA: Insufficient documentation

## 2020-11-20 DIAGNOSIS — I2510 Atherosclerotic heart disease of native coronary artery without angina pectoris: Secondary | ICD-10-CM | POA: Insufficient documentation

## 2020-11-20 DIAGNOSIS — Z79899 Other long term (current) drug therapy: Secondary | ICD-10-CM | POA: Insufficient documentation

## 2020-11-20 DIAGNOSIS — R220 Localized swelling, mass and lump, head: Secondary | ICD-10-CM | POA: Diagnosis not present

## 2020-11-20 DIAGNOSIS — Z85828 Personal history of other malignant neoplasm of skin: Secondary | ICD-10-CM | POA: Insufficient documentation

## 2020-11-20 DIAGNOSIS — Z7984 Long term (current) use of oral hypoglycemic drugs: Secondary | ICD-10-CM | POA: Diagnosis not present

## 2020-11-20 DIAGNOSIS — I110 Hypertensive heart disease with heart failure: Secondary | ICD-10-CM | POA: Diagnosis not present

## 2020-11-20 DIAGNOSIS — I5030 Unspecified diastolic (congestive) heart failure: Secondary | ICD-10-CM | POA: Insufficient documentation

## 2020-11-20 DIAGNOSIS — J449 Chronic obstructive pulmonary disease, unspecified: Secondary | ICD-10-CM | POA: Insufficient documentation

## 2020-11-20 DIAGNOSIS — Z87891 Personal history of nicotine dependence: Secondary | ICD-10-CM | POA: Insufficient documentation

## 2020-11-20 LAB — CBC WITH DIFFERENTIAL/PLATELET
Abs Immature Granulocytes: 0.07 10*3/uL (ref 0.00–0.07)
Basophils Absolute: 0.1 10*3/uL (ref 0.0–0.1)
Basophils Relative: 1 %
Eosinophils Absolute: 0.4 10*3/uL (ref 0.0–0.5)
Eosinophils Relative: 2 %
HCT: 47.1 % (ref 39.0–52.0)
Hemoglobin: 16.5 g/dL (ref 13.0–17.0)
Immature Granulocytes: 0 %
Lymphocytes Relative: 7 %
Lymphs Abs: 1.3 10*3/uL (ref 0.7–4.0)
MCH: 29.7 pg (ref 26.0–34.0)
MCHC: 35 g/dL (ref 30.0–36.0)
MCV: 84.7 fL (ref 80.0–100.0)
Monocytes Absolute: 0.8 10*3/uL (ref 0.1–1.0)
Monocytes Relative: 4 %
Neutro Abs: 15.9 10*3/uL — ABNORMAL HIGH (ref 1.7–7.7)
Neutrophils Relative %: 86 %
Platelets: 185 10*3/uL (ref 150–400)
RBC: 5.56 MIL/uL (ref 4.22–5.81)
RDW: 12.9 % (ref 11.5–15.5)
WBC: 18.6 10*3/uL — ABNORMAL HIGH (ref 4.0–10.5)
nRBC: 0 % (ref 0.0–0.2)

## 2020-11-20 LAB — COMPREHENSIVE METABOLIC PANEL WITH GFR
ALT: 21 U/L (ref 0–44)
AST: 19 U/L (ref 15–41)
Albumin: 5.1 g/dL — ABNORMAL HIGH (ref 3.5–5.0)
Alkaline Phosphatase: 100 U/L (ref 38–126)
Anion gap: 12 (ref 5–15)
BUN: 17 mg/dL (ref 8–23)
CO2: 19 mmol/L — ABNORMAL LOW (ref 22–32)
Calcium: 9.7 mg/dL (ref 8.9–10.3)
Chloride: 106 mmol/L (ref 98–111)
Creatinine, Ser: 1.18 mg/dL (ref 0.61–1.24)
GFR, Estimated: >60 mL/min
Glucose, Bld: 148 mg/dL — ABNORMAL HIGH (ref 70–99)
Potassium: 4.7 mmol/L (ref 3.5–5.1)
Sodium: 137 mmol/L (ref 135–145)
Total Bilirubin: 1.8 mg/dL — ABNORMAL HIGH (ref 0.3–1.2)
Total Protein: 7.7 g/dL (ref 6.5–8.1)

## 2020-11-20 LAB — SEDIMENTATION RATE: Sed Rate: 0 mm/h (ref 0–16)

## 2020-11-20 LAB — C-REACTIVE PROTEIN: CRP: <0.5 mg/dL

## 2020-11-20 MED ORDER — PREDNISONE 20 MG PO TABS
40.0000 mg | ORAL_TABLET | Freq: Once | ORAL | Status: AC
  Administered 2020-11-20: 14:00:00 40 mg via ORAL
  Filled 2020-11-20: qty 2

## 2020-11-20 MED ORDER — FAMOTIDINE 20 MG PO TABS: 20.0000 mg | ORAL_TABLET | Freq: Two times a day (BID) | ORAL | 0 refills | Status: DC

## 2020-11-20 MED ORDER — PREDNISONE 10 MG PO TABS: ORAL_TABLET | ORAL | 0 refills | Status: DC

## 2020-11-20 MED ORDER — DOXYCYCLINE HYCLATE 100 MG PO CAPS: 100.0000 mg | ORAL_CAPSULE | Freq: Two times a day (BID) | ORAL | 0 refills | Status: AC

## 2020-11-20 MED ORDER — DOXYCYCLINE HYCLATE 100 MG PO CAPS: 100.0000 mg | ORAL_CAPSULE | Freq: Two times a day (BID) | ORAL | 0 refills | Status: DC

## 2020-11-20 MED ORDER — FAMOTIDINE 20 MG PO TABS
20.0000 mg | ORAL_TABLET | Freq: Once | ORAL | Status: AC
  Administered 2020-11-20: 14:00:00 20 mg via ORAL
  Filled 2020-11-20: qty 1

## 2020-11-20 NOTE — ED Notes (Signed)
See triage note, pt reports rash to abdomen and face that started yesterday. Denies pain, some itching. Denies shob, difficulty breathing.  NAD noted, RR Even and unlabored

## 2020-11-20 NOTE — Telephone Encounter (Signed)
[place] [facility] - Client TELEPHONE ADVICE RECORD [facility] Patient Name: [name] Gender: Male DOB: July 04, 1946 Age: 74 Y 44 M 28 D Return Phone Number: [phone] (Primary), [phone] (Secondary) Address: City/State/Zip: [place] [address] Client [place] [facility] - Client Client Site [place] - Day Physician Copland, Frederico Hamman - MD Contact Type Call Who Is Calling Patient / Member / Family / Caregiver Call Type Triage / Clinical Caller Name [name] Relationship To Patient Spouse Return Phone Number [phone] (Secondary) Chief Complaint Facial Swelling Reason for Call Symptomatic / Request for [place] states her husband has a rash that is getting worse. His eye and face are also swollen. [facility] ER Translation No Nurse Assessment Nurse: Alveta Heimlich, RN, Rise Paganini Date/Time Eilene Ghazi Time): 11/20/2020 8:44:35 AM Confirm and document reason for call. If symptomatic, describe symptoms. ---Caller states she is having a rash with facial and eye swelling. Yesterday he had a rash on stomach and chest. He first noticed yesterday morning.He is having some eye swelling and face swelling. The rash is little red bumps that are going together and are smooth. He has no fever. His eyes were about shut when he woke up . Took children's benadryl, 15 ml at 530 am. At 730 am was given a benadryl tablet, 25 mg. No difficulty breathing or swallowing. Does the patient have any new or worsening symptoms? ---Yes Will a triage be completed? ---Yes Related visit to physician within the last 2 weeks? ---No Does the PT have any chronic conditions? (i.e. diabetes, asthma, this includes High risk factors for pregnancy, etc.) ---Yes List chronic conditions. ---diabetic, HTN, COPD, cholesterol Is this a behavioral health or substance abuse call?  ---No Guidelines Guideline Title Affirmed Question Affirmed Notes Nurse Date/Time Eilene Ghazi Time) Face Swelling Taking an ACE Inhibitor medication (e.g., benazepril/LOTENSIN, captopril/CAPOTEN, enalapril/VASOTEC, lisinopril/ZESTRIL) Alveta Heimlich, RN, [facility] 11/20/2020 8:49:48 AM PLEASE NOTE: All timestamps contained within this report are represented as [place] Standard Time. CONFIDENTIALTY NOTICE: This fax transmission is intended only for the addressee. It contains information that is legally privileged, confidential or otherwise protected from use or disclosure. If you are not the intended recipient, you are strictly prohibited from reviewing, disclosing, copying using or disseminating any of this information or taking any action in reliance on or regarding this information. If you have received this fax in error, please notify us immediately by telephone so that we can arrange for its return to [place]. Phone: [phone], Toll-Free: [phone], Fax: [phone] Page: 2 of 2 Call Id: [id number] [place]. Time Eilene Ghazi Time) Disposition Final User 11/20/2020 8:53:42 AM Go to ED Now (or PCP triage) Yes Alveta Heimlich, RN, Ali Lowe Disagree/Comply Comply Caller Understands Yes PreDisposition Call Doctor Care Advice Given Per Guideline GO TO ED NOW (OR PCP TRIAGE): CALL EMS 911 IF: * Any difficulty breathing or swallowing * Any tongue swelling or swelling inside your mouth. CARE ADVICE given per Face Swelling (Adult) guideline. Referrals GO TO FACILITY OTHER - SPECIFY

## 2020-11-20 NOTE — Telephone Encounter (Signed)
Per chart review tab pt is at [facility] ED. 

## 2020-11-20 NOTE — Discharge Instructions (Addendum)
Follow-up with your primary care provider although dermatology offices are listed on your discharge papers.  [place] [facility] and also [place] for an appointment if you continue to have problems.  Return to the emergency department immediately if any severe worsening of your symptoms or difficulty breathing.  3 medications were sent to your pharmacy.  The prednisone is a tapered dose beginning tomorrow.  Pepcid is twice a day every day and the doxycycline is an antibiotic and should be taken twice a day for 7 days.

## 2020-11-20 NOTE — ED Triage Notes (Addendum)
Pt in via POV, reports rash to trunk x a few days, waking up this morning with swelling around eyes.  Face does appear puffy throughout.  Denies any new medication, detergents, foods. Denies any difficulty breathing, in NAD at this time.    Reports taking dose of Benadryl this morning with some relief.

## 2020-11-20 NOTE — ED Provider Notes (Signed)
[facility] Emergency Department Provider Note  ____________________________________________   Event Date/Time   First MD Initiated Contact with Patient 11/20/20 1102     (approximate)  I have reviewed the triage vital signs and the nursing notes.   HISTORY  Chief Complaint Rash and Facial Swelling   HPI [name] is a 74 y.o. male presents to the ED with complaint of rash to his abdomen that began a few days ago.  Patient states he woke this morning with swelling around his eyes.  He denies any pain or itching in these areas.  He also has had no shortness of breath, difficulty breathing, difficulty swallowing or speaking.  Patient took a child's dose of Benadryl last evening but this morning took Benadryl 25 mg and states that he did get some relief of his discomfort.  He denies any new medications, food, clothing or detergents.  Patient also denies any tick bites in the past.  He denies any fever, chills or URI symptoms.       Past Medical History:  Diagnosis Date  . Aortic transection 2005   Traumatic after a fall from a second floor. s/p repair at [facility]  . Basal cell carcinoma   . CAD (coronary artery disease) 1992   a. 1992 Acute anterior MI, thrombolytic therapy-->cath reportedly w/o significant CAD; b. 08/2016 MV: EF 57%, hypertensive respons, no ischemia/infarct; c. 04/2017 CTA chest w/ coronary Ca2+.  . Depression   . Diastolic dysfunction    a. 04/2017 Echo: EF 60-65%, Gr1 DD, nl RV fxn.  . Erectile dysfunction   . Fall 2005   fell off house: torn aorta, clavicle fracture, rib fracture, vertebral fractures, lung contusion, coma x 2 weeks  . Fracture of femoral neck, left ([place]) 08/09/2014   ORIF, Dr. Marry Guan  . Fracture of radial neck, left, closed 08/09/2014  . Gallstone pancreatitis   . Gastric ulcer   . History of ATN 08/09/2014   [facility], 2 days of dialysis (ARF)  . Hyperlipidemia    Statin with joint pain   . Hypertension   . Obesity   .  Peripheral vascular disease ([place])    Atherosclerotic:R renal artery stenosis  . Type II diabetes mellitus ([place])   . Upper GI bleed    a. 04/2017 admit w/ melena and HGB down to 7 req prbc's; b. 04/2017 EGD: small HH, non-bleeding gastric & duod ulcers, non-bleeding erosive gastropathy-->PPI Rx.    Patient Active Problem List   Diagnosis Date Noted  . OSA (obstructive sleep apnea) 09/15/2017  . Coronary artery disease of native artery of native heart with stable angina pectoris ([place]) 08/18/2017  . COPD (chronic obstructive pulmonary disease) ([place]) 05/14/2017  . Radial neck fracture 09/14/2014  . Fracture of femoral neck, left ([place]) 09/14/2014  . Centrilobular emphysema ([place]) 09/12/2014  . Aortic transection   . Peripheral vascular disease ([place])   . History of MI (myocardial infarction)   . Duodenal ulcer with hemorrhage 04/30/2011  . DM (diabetes mellitus) type II, controlled, with peripheral vascular disorder ([place]) 02/13/2010  . Hyperlipidemia LDL goal <70 08/16/2008  . ERECTILE DYSFUNCTION 08/16/2008  . Major depressive disorder, recurrent, in remission ([place]) 08/16/2008  . Essential hypertension 08/16/2008    Past Surgical History:  Procedure Laterality Date  . CARDIAC CATHETERIZATION  08/1991   50 % mid-Lad stenosis with clot, 25-505 second marginal  . CARDIOVASCULAR SURGERY     with ruptured Aorta, Dr. Camila Li, [facility]   . CHOLECYSTECTOMY    .  ESOPHAGOGASTRODUODENOSCOPY (EGD) WITH PROPOFOL N/A 05/03/2017   Procedure: ESOPHAGOGASTRODUODENOSCOPY (EGD) WITH PROPOFOL;  Surgeon: Lucilla Lame, MD;  Location: [facility] ENDOSCOPY;  Service: Endoscopy;  Laterality: N/A;  . KNEE SURGERY     Right   . ORIF HIP FRACTURE Left 08/12/2014   Dr. Marry Guan  . THORACOTOMY     thoracic aorta repair   . TRACHEOSTOMY     s/p reversal  . VIDEO BRONCHOSCOPY Bilateral 05/27/2017   Procedure: VIDEO BRONCHOSCOPY WITHOUT FLUORO;  Surgeon: Juanito Doom, MD;  Location: [facility] ENDOSCOPY;  Service:  Cardiopulmonary;  Laterality: Bilateral;    Prior to Admission medications   Medication Sig Start Date End Date Taking? Authorizing Provider  amLODipine (NORVASC) 10 MG tablet Take 1 tablet (10 mg total) by mouth daily. 06/05/20   Copland, Frederico Hamman, MD  aspirin 81 MG chewable tablet Chew 81 mg by mouth daily.    [provider]  atorvastatin (LIPITOR) 40 MG tablet TAKE ONE TABLET BY MOUTH EVERY DAY 07/16/20   Copland, Frederico Hamman, MD  citalopram (CELEXA) 10 MG tablet TAKE ONE TABLET BY MOUTH EVERY DAY 09/23/20   Copland, Frederico Hamman, MD  doxycycline (VIBRAMYCIN) 100 MG capsule Take 1 capsule (100 mg total) by mouth 2 (two) times daily for 7 days. 11/20/20 11/27/20  Johnn Hai, PA-C  famotidine (PEPCID) 20 MG tablet Take 1 tablet (20 mg total) by mouth 2 (two) times daily. 11/20/20 11/20/21  Johnn Hai, PA-C  metFORMIN (GLUCOPHAGE-XR) 500 MG 24 hr tablet TAKE 1 TABLET BY MOUTH DAILY 08/19/20   Copland, Frederico Hamman, MD  nitroGLYCERIN (NITROSTAT) 0.4 MG SL tablet Place 1 tablet (0.4 mg total) under the tongue every 5 (five) minutes as needed for chest pain. 05/19/17   Theora Gianotti, NP  predniSONE (DELTASONE) 10 MG tablet Take 6 tablets  today, on day 2 take 5 tablets, day 3 take 4 tablets, day 4 take 3 tablets, day 5 take  2 tablets and 1 tablet the last day 11/20/20   Johnn Hai, PA-C    Allergies Contrast media [iodinated diagnostic agents], Xarelto [rivaroxaban], Clindamycin/lincomycin, Dye fdc red [red dye], Lovenox [enoxaparin sodium], and Penicillins  Family History  Problem Relation Age of Onset  . Dementia Mother 88  . Heart attack Father 45  . Diabetes Brother 71  . Heart attack Brother   . Diabetes Brother   . Colon cancer Neg Hx   . Stomach cancer Neg Hx   . Esophageal cancer Neg Hx   . Rectal cancer Neg Hx     Social History Social History   Tobacco Use  . Smoking status: Former Smoker    Packs/day: 1.50    Years: 20.00    Pack years: 30.00     Types: Cigarettes    Quit date: 09/13/1991    Years since quitting: 29.2  . Smokeless tobacco: Never Used  . Tobacco comment: quit post MI  Vaping Use  . Vaping Use: Never used  Substance Use Topics  . Alcohol use: Yes  . Drug use: No    Review of Systems Constitutional: No fever/chills Eyes: No visual changes. ENT: No sore throat.  Negative for difficulty swallowing or speaking. Cardiovascular: Denies chest pain. Respiratory: Denies shortness of breath.  Negative for cough, wheezing or coughing. Gastrointestinal: No abdominal pain.  No nausea, no vomiting.  No diarrhea.    Genitourinary: Negative for dysuria. Musculoskeletal: Negative for musculoskeletal pain. Skin: Positive for rash. Neurological: Negative for headaches, focal weakness or numbness. ____________________________________________   PHYSICAL EXAM:  VITAL SIGNS: ED Triage Vitals  Enc Vitals Group     BP 11/20/20 1023 (!) 145/80     Pulse Rate 11/20/20 1023 89     Resp 11/20/20 1023 16     Temp 11/20/20 1023 98.5 F (36.9 C)     Temp Source 11/20/20 1023 Oral     SpO2 11/20/20 1023 94 %     Weight 11/20/20 1023 230 lb (104.3 kg)     Height 11/20/20 1023 5\' 7"  (1.702 m)     Head Circumference --      Peak Flow --      Pain Score 11/20/20 1037 0     Pain Loc --      Pain Edu? --      Excl. in [place]? --     Constitutional: Alert and oriented. Well appearing and in no acute distress. Eyes: Conjunctivae are normal. PERRL. EOMI. Head: Atraumatic. Nose: No congestion/rhinnorhea. Mouth/Throat: Mucous membranes are moist.  Oropharynx non-erythematous.  No edema noted posteriorly.  Patient is able to swallow secretions without any difficulty. Neck: No stridor.   Hematological/Lymphatic/Immunilogical: No cervical lymphadenopathy. Cardiovascular: Normal rate, regular rhythm. Grossly normal heart sounds.  Good peripheral circulation. Respiratory: Normal respiratory effort.  No retractions. Lungs  CTAB. Gastrointestinal: Soft and nontender. No distention.  Bowel sounds are normoactive x4 quadrants. Musculoskeletal: Patient is able move upper and lower extremities with any difficulty normal gait was noted.  Patient has no tenderness or pain on palpation. Neurologic:  Normal speech and language. No gross focal neurologic deficits are appreciated. No gait instability. Skin:  Skin is warm, dry and intact.  There is an erythematous rash present most predominantly on the trunk.  The upper portion of the trunk is macular without vesicles or papules.  There is no appreciable warmth.  The lower portion of the trunk has some satellite areas again with no papules, vesicles or pustules.  Patient's face is slightly edematous with erythema.  There is no rash involvement noted on the upper and lower extremities. Psychiatric: Mood and affect are normal. Speech and behavior are normal.  ____________________________________________   LABS (all labs ordered are listed, but only abnormal results are displayed)  Labs Reviewed  CBC WITH DIFFERENTIAL/PLATELET - Abnormal; Notable for the following components:      Result Value   WBC 18.6 (*)    Neutro Abs 15.9 (*)    All other components within normal limits  COMPREHENSIVE METABOLIC PANEL - Abnormal; Notable for the following components:   CO2 19 (*)    Glucose, Bld 148 (*)    Albumin 5.1 (*)    Total Bilirubin 1.8 (*)    All other components within normal limits  SEDIMENTATION RATE  C-REACTIVE PROTEIN     PROCEDURES  Procedure(s) performed (including Critical Care):  Procedures   ____________________________________________   INITIAL IMPRESSION / ASSESSMENT AND PLAN / ED COURSE  As part of my medical decision making, I reviewed the following data within the electronic MEDICAL RECORD NUMBER Notes from prior ED visits and [place] Controlled Substance Database  74 year old male presents to the ED with rash to his trunk that began several days ago and  now beginning yesterday started with his face which was more involved this morning with some swelling around his eyes.  Patient denies any known allergens and denies changes in his medication.  Patient denies any difficulty breathing, swallowing or speaking.  Dr. Ellender Hose was also in to see the patient.  Patient was given Pepcid and prednisone  while in the ED.  Lab work included a CMP, CBC with differential which was elevated with a WBC of 18.6 and neutrophil elevated at 15.9.  Nonfasting glucose was 148 and total bilirubin elevated at 1.8.  C-reactive protein and sed rate were still pending at the time this is being dictated.  I spoke with patient about being admitted for further evaluation and investigation of this rash which is concerning with his WBC being 18.6.  Patient was polite and said that he would rather not be in the emergency department department or hospital at this time.  He does agree to outpatient medication and agrees to return to the emergency department immediately if any severe worsening of his symptoms or sudden changes.  He is aware that his white count is elevated and this is concerning.  Patient is to follow-up with his PCP also the 2 dermatology offices in [facility] were listed on his discharge papers.  Patient was discharged with prescription for doxycycline 100 mg twice daily for 7 days, a tapering dose of prednisone and Pepcid 20 mg twice daily.  Care plan was also discussed with Dr. Ellender Hose prior to patient being discharged.  ____________________________________________   FINAL CLINICAL IMPRESSION(S) / ED DIAGNOSES  Final diagnoses:  Rash and nonspecific skin eruption     ED Discharge Orders         Ordered    doxycycline (VIBRAMYCIN) 100 MG capsule  2 times daily,   Status:  Discontinued        11/20/20 1431    predniSONE (DELTASONE) 10 MG tablet        11/20/20 1431    famotidine (PEPCID) 20 MG tablet  2 times daily        11/20/20 1431    doxycycline  (VIBRAMYCIN) 100 MG capsule  2 times daily        11/20/20 1433          *Please note:  Jamyrion Venturo Manthei was evaluated in Emergency Department on 11/20/2020 for the symptoms described in the history of present illness. He was evaluated in the context of the global COVID-19 pandemic, which necessitated consideration that the patient might be at risk for infection with the SARS-CoV-2 virus that causes COVID-19. Institutional protocols and algorithms that pertain to the evaluation of patients at risk for COVID-19 are in a state of rapid change based on information released by regulatory bodies including the CDC and federal and state organizations. These policies and algorithms were followed during the patient's care in the ED.  Some ED evaluations and interventions may be delayed as a result of limited staffing during and the pandemic.*   Note:  This document was prepared using Dragon voice recognition software and may include unintentional dictation errors.    Johnn Hai, PA-C 11/20/20 1543    Arta Silence, MD 11/21/20 [phone]

## 2020-11-27 ENCOUNTER — Ambulatory Visit (INDEPENDENT_AMBULATORY_CARE_PROVIDER_SITE_OTHER): Payer: PPO | Admitting: Family Medicine

## 2020-11-27 ENCOUNTER — Other Ambulatory Visit: Payer: Self-pay

## 2020-11-27 ENCOUNTER — Encounter: Payer: Self-pay | Admitting: Family Medicine

## 2020-11-27 VITALS — BP 130/70 | HR 75 | Temp 98.2°F | Ht 68.0 in | Wt 233.0 lb

## 2020-11-27 DIAGNOSIS — R7989 Other specified abnormal findings of blood chemistry: Secondary | ICD-10-CM

## 2020-11-27 DIAGNOSIS — R945 Abnormal results of liver function studies: Secondary | ICD-10-CM | POA: Diagnosis not present

## 2020-11-27 DIAGNOSIS — Z23 Encounter for immunization: Secondary | ICD-10-CM | POA: Diagnosis not present

## 2020-11-27 DIAGNOSIS — R220 Localized swelling, mass and lump, head: Secondary | ICD-10-CM

## 2020-11-27 LAB — CBC WITH DIFFERENTIAL/PLATELET
Basophils Absolute: 0.1 10*3/uL (ref 0.0–0.1)
Basophils Relative: 0.9 % (ref 0.0–3.0)
Eosinophils Absolute: 0.3 10*3/uL (ref 0.0–0.7)
Eosinophils Relative: 4.4 % (ref 0.0–5.0)
HCT: 44.7 % (ref 39.0–52.0)
Hemoglobin: 15.7 g/dL (ref 13.0–17.0)
Lymphocytes Relative: 25.9 % (ref 12.0–46.0)
Lymphs Abs: 2 10*3/uL (ref 0.7–4.0)
MCHC: 35.2 g/dL (ref 30.0–36.0)
MCV: 85.1 fl (ref 78.0–100.0)
Monocytes Absolute: 0.7 10*3/uL (ref 0.1–1.0)
Monocytes Relative: 8.5 % (ref 3.0–12.0)
Neutro Abs: 4.6 10*3/uL (ref 1.4–7.7)
Neutrophils Relative %: 60.3 % (ref 43.0–77.0)
Platelets: 212 10*3/uL (ref 150.0–400.0)
RBC: 5.26 Mil/uL (ref 4.22–5.81)
RDW: 13.2 % (ref 11.5–15.5)
WBC: 7.7 10*3/uL (ref 4.0–10.5)

## 2020-11-27 LAB — HEPATIC FUNCTION PANEL
ALT: 21 U/L (ref 0–53)
AST: 14 U/L (ref 0–37)
Albumin: 4.8 g/dL (ref 3.5–5.2)
Alkaline Phosphatase: 91 U/L (ref 39–117)
Bilirubin, Direct: 0.2 mg/dL (ref 0.0–0.3)
Total Bilirubin: 1 mg/dL (ref 0.2–1.2)
Total Protein: 7.2 g/dL (ref 6.0–8.3)

## 2020-11-27 NOTE — Progress Notes (Signed)
Colton Debruler T. Isael Stille, MD, CAQ Sports Medicine  Primary Care and Sports Medicine [facility] [address] [place], [address]  Phone: [phone]  FAX: [phone]  [name] - 74 y.o. male  MRN [id number]  Date of Birth: 1946-10-06  Date: 11/27/2020  PCP: Hannah Beat, MD  Referral: Hannah Beat, MD  Chief Complaint  Patient presents with  . Follow-up    ER Visit-Facial Swelling/Rash    This visit occurred during the SARS-CoV-2 public health emergency.  Safety protocols were in place, including screening questions prior to the visit, additional usage of staff PPE, and extensive cleaning of exam room while observing appropriate contact time as indicated for disinfecting solutions.   Subjective:   [name] is a 74 y.o. very pleasant male patient with Body mass index is 35.43 kg/m. who presents with the following:  1 week f/u after ER visit from 11/20/2020  Day before noticed some swelling around his eyes.  Given some prednisone and pepcid in the ER. CBC with diff was elevated to 18.6.  D/c with prednisone for 7 days and a script for doxy.  Woke up with some redness and a rash.  On the 22nd got swollen.  Wife thought that he should go to the emergency.  Eyes and cheeks.  No known ingestions.  Never has happened before.  Went away after a couple of days.  Went away and faded.  Recheck labs  Review of Systems is noted in the HPI, as appropriate  Objective:   BP 130/70   Pulse 75   Temp 98.2 F (36.8 C) (Temporal)   Ht 5\' 8"  (1.727 m)   Wt 233 lb (105.7 kg)   SpO2 93%   BMI 35.43 kg/m   GEN: No acute distress; alert,appropriate. PULM: Breathing comfortably in no respiratory distress PSYCH: Normally interactive.  CV: RRR, no m/g/r   Laboratory and Imaging Data:  Assessment and Plan:     ICD-10-CM   1. Facial swelling  R22.0   2. Abnormal CBC  R79.89 CBC with Differential/Platelet  3. Abnormal LFTs   R94.5 Hepatic function panel  4. Need for influenza vaccination  Z23 Flu Vaccine QUAD High Dose(Fluad)   He does not know any form of ingestion that could have happened, and he is not on ACE inhibitor.  We talked about this, and I do not think we are likely ever to figure out what caused this.  Of main importance, if he does have anything that resembles this a, then I think he needs to have formal allergy testing.  He is 65 and this is never happened before in his life.  Recheck abnormal labs from the ER.  No orders of the defined types were placed in this encounter.  Medications Discontinued During This Encounter  Medication Reason  . predniSONE (DELTASONE) 10 MG tablet Completed Course   Orders Placed This Encounter  Procedures  . Flu Vaccine QUAD High Dose(Fluad)  . CBC with Differential/Platelet  . Hepatic function panel    Follow-up: No follow-ups on file.  Signed,  Elpidio Galea. Shirlie Enck, MD   Outpatient Encounter Medications as of 11/27/2020  Medication Sig  . amLODipine (NORVASC) 10 MG tablet Take 1 tablet (10 mg total) by mouth daily.  [name] Kitchen aspirin 81 MG chewable tablet Chew 81 mg by mouth daily.  [name] Kitchen atorvastatin (LIPITOR) 40 MG tablet TAKE ONE TABLET BY MOUTH EVERY DAY  . citalopram (CELEXA) 10 MG tablet TAKE ONE TABLET  BY MOUTH EVERY DAY  . doxycycline (VIBRAMYCIN) 100 MG capsule Take 1 capsule (100 mg total) by mouth 2 (two) times daily for 7 days.  . famotidine (PEPCID) 20 MG tablet Take 1 tablet (20 mg total) by mouth 2 (two) times daily.  . metFORMIN (GLUCOPHAGE-XR) 500 MG 24 hr tablet TAKE 1 TABLET BY MOUTH DAILY  . nitroGLYCERIN (NITROSTAT) 0.4 MG SL tablet Place 1 tablet (0.4 mg total) under the tongue every 5 (five) minutes as needed for chest pain.  . [DISCONTINUED] predniSONE (DELTASONE) 10 MG tablet Take 6 tablets  today, on day 2 take 5 tablets, day 3 take 4 tablets, day 4 take 3 tablets, day 5 take  2 tablets and 1 tablet the last day   No  facility-administered encounter medications on file as of 11/27/2020.

## 2021-02-17 ENCOUNTER — Other Ambulatory Visit: Payer: Self-pay | Admitting: Family Medicine

## 2021-02-21 ENCOUNTER — Telehealth: Payer: Self-pay

## 2021-02-21 NOTE — Telephone Encounter (Signed)
I spoke with pt; pt said he has not gone yet but sometime this afternoon pt is going to one of the UC in [place]. Pt is aware of location of all UC in [place]. Pt is not having any CP or abd pain or SOB. Sending note as FYI to Dr Lorelei Pont and Butch Penny CMA.

## 2021-02-21 NOTE — Telephone Encounter (Signed)
[facility] - Client TELEPHONE ADVICE RECORD [facility] Patient Name: [name] Gender: Male DOB: 06/03/1946 Age: 75 Y 9 M 1 D Return Phone Number: [phone] (Secondary) Address: City/State/Zip: [place] [address] Client [place] [facility] - Client Client Site [facility] - Day Physician Copland, Frederico Hamman - MD Contact Type Call Who Is Calling Patient / Member / Family / Caregiver Call Type Triage / Clinical Relationship To Patient Self Return Phone Number [phone] (Secondary) Chief Complaint Coughing Up Blood Reason for Call Symptomatic / Request for [place] states, pt is coughing up brown mucus ( blood or cough syrup) Translation No Nurse Assessment Nurse: Vallery Sa, RN, Cathy Date/Time (Eastern Time): 02/21/2021 11:36:12 AM Confirm and document reason for call. If symptomatic, describe symptoms. ---[name] states that he became coughing up rusty brown mucous that he thinks could be blood about 2 days ago. No severe breathing difficulty or chest pain. No fever. Alert and responsive. Does the patient have any new or worsening symptoms? ---Yes Will a triage be completed? ---Yes Related visit to physician within the last 2 weeks? ---No Does the PT have any chronic conditions? (i.e. diabetes, asthma, this includes High risk factors for pregnancy, etc.) ---Yes List chronic conditions. ---Diabetes, High Blood Pressure and Cholesterol Is this a behavioral health or substance abuse call? ---No Guidelines Guideline Title Affirmed Question Affirmed Notes Nurse Date/Time (Eastern Time) Coughing Up Blood [1] Coughed up blood AND [2] > 1 tablespoon (15 ml) (Exception: Blood-tinged sputum.) Vallery Sa, RN, Cathy 02/21/2021 11:39:27 AM Disp. Time Eilene Ghazi Time) Disposition Final User 02/21/2021 11:42:07 AM See HCP within 4 Hours (or PCP triage) Yes [place], RN, Rosey Bath  Disagree/Comply Comply PLEASE NOTE: All timestamps contained within this report are represented as [place] Standard Time. CONFIDENTIALTY NOTICE: This fax transmission is intended only for the addressee. It contains information that is legally privileged, confidential or otherwise protected from use or disclosure. If you are not the intended recipient, you are strictly prohibited from reviewing, disclosing, copying using or disseminating any of this information or taking any action in reliance on or regarding this information. If you have received this fax in error, please notify us immediately by telephone so that we can arrange for its return to [place]. Phone: [phone], Toll-Free: [phone], Fax: [phone] Page: 2 of 2 Call Id: [id number] [place] Understands Yes PreDisposition Did not know what to do Care Advice Given Per Guideline SEE HCP (OR PCP TRIAGE) WITHIN 4 HOURS: * IF OFFICE WILL BE OPEN: You need to be seen within the next 3 or 4 hours. Call your doctor (or NP/PA) now or as soon as the office opens. CALL BACK IF: * You become worse CARE ADVICE given per Coughing Up Blood guideline. Comments User: Berton Mount, RN Date/Time Eilene Ghazi Time): 02/21/2021 11:45:18 AM [name] shares there are no appointments available and to direct him to urgent care. [name] notified. Referrals REFERRED TO PCP OFFICE GO TO FACILITY UNDECIDED

## 2021-02-22 ENCOUNTER — Other Ambulatory Visit: Payer: Self-pay

## 2021-02-22 ENCOUNTER — Emergency Department: Payer: PPO

## 2021-02-22 ENCOUNTER — Inpatient Hospital Stay
Admission: EM | Admit: 2021-02-22 | Discharge: 2021-02-24 | DRG: 193 | Disposition: A | Discharge status: Home / Self Care | Payer: PPO | Attending: Internal Medicine | Admitting: Internal Medicine

## 2021-02-22 DIAGNOSIS — Z7984 Long term (current) use of oral hypoglycemic drugs: Secondary | ICD-10-CM

## 2021-02-22 DIAGNOSIS — Z91041 Radiographic dye allergy status: Secondary | ICD-10-CM | POA: Diagnosis not present

## 2021-02-22 DIAGNOSIS — K219 Gastro-esophageal reflux disease without esophagitis: Secondary | ICD-10-CM | POA: Diagnosis present

## 2021-02-22 DIAGNOSIS — I5032 Chronic diastolic (congestive) heart failure: Secondary | ICD-10-CM | POA: Diagnosis present

## 2021-02-22 DIAGNOSIS — Z888 Allergy status to other drugs, medicaments and biological substances status: Secondary | ICD-10-CM | POA: Diagnosis not present

## 2021-02-22 DIAGNOSIS — Z880 Allergy status to penicillin: Secondary | ICD-10-CM

## 2021-02-22 DIAGNOSIS — J439 Emphysema, unspecified: Secondary | ICD-10-CM | POA: Diagnosis not present

## 2021-02-22 DIAGNOSIS — Z9102 Food additives allergy status: Secondary | ICD-10-CM

## 2021-02-22 DIAGNOSIS — Z881 Allergy status to other antibiotic agents status: Secondary | ICD-10-CM

## 2021-02-22 DIAGNOSIS — E785 Hyperlipidemia, unspecified: Secondary | ICD-10-CM | POA: Diagnosis present

## 2021-02-22 DIAGNOSIS — R0602 Shortness of breath: Secondary | ICD-10-CM | POA: Diagnosis not present

## 2021-02-22 DIAGNOSIS — E669 Obesity, unspecified: Secondary | ICD-10-CM | POA: Diagnosis present

## 2021-02-22 DIAGNOSIS — Z85828 Personal history of other malignant neoplasm of skin: Secondary | ICD-10-CM | POA: Diagnosis not present

## 2021-02-22 DIAGNOSIS — Z8249 Family history of ischemic heart disease and other diseases of the circulatory system: Secondary | ICD-10-CM

## 2021-02-22 DIAGNOSIS — Z20822 Contact with and (suspected) exposure to covid-19: Secondary | ICD-10-CM | POA: Diagnosis present

## 2021-02-22 DIAGNOSIS — J9601 Acute respiratory failure with hypoxia: Secondary | ICD-10-CM | POA: Diagnosis present

## 2021-02-22 DIAGNOSIS — F329 Major depressive disorder, single episode, unspecified: Secondary | ICD-10-CM | POA: Diagnosis present

## 2021-02-22 DIAGNOSIS — I252 Old myocardial infarction: Secondary | ICD-10-CM | POA: Diagnosis not present

## 2021-02-22 DIAGNOSIS — I10 Essential (primary) hypertension: Secondary | ICD-10-CM | POA: Diagnosis not present

## 2021-02-22 DIAGNOSIS — Z7982 Long term (current) use of aspirin: Secondary | ICD-10-CM

## 2021-02-22 DIAGNOSIS — Z833 Family history of diabetes mellitus: Secondary | ICD-10-CM

## 2021-02-22 DIAGNOSIS — Z79899 Other long term (current) drug therapy: Secondary | ICD-10-CM | POA: Diagnosis not present

## 2021-02-22 DIAGNOSIS — F3340 Major depressive disorder, recurrent, in remission, unspecified: Secondary | ICD-10-CM | POA: Diagnosis present

## 2021-02-22 DIAGNOSIS — J189 Pneumonia, unspecified organism: Principal | ICD-10-CM | POA: Diagnosis present

## 2021-02-22 DIAGNOSIS — I110 Hypertensive heart disease with heart failure: Secondary | ICD-10-CM | POA: Diagnosis present

## 2021-02-22 DIAGNOSIS — I25118 Atherosclerotic heart disease of native coronary artery with other forms of angina pectoris: Secondary | ICD-10-CM | POA: Diagnosis present

## 2021-02-22 DIAGNOSIS — J440 Chronic obstructive pulmonary disease with acute lower respiratory infection: Secondary | ICD-10-CM | POA: Diagnosis present

## 2021-02-22 DIAGNOSIS — J432 Centrilobular emphysema: Secondary | ICD-10-CM | POA: Diagnosis not present

## 2021-02-22 DIAGNOSIS — E1151 Type 2 diabetes mellitus with diabetic peripheral angiopathy without gangrene: Secondary | ICD-10-CM | POA: Diagnosis present

## 2021-02-22 DIAGNOSIS — G4733 Obstructive sleep apnea (adult) (pediatric): Secondary | ICD-10-CM | POA: Diagnosis present

## 2021-02-22 DIAGNOSIS — J441 Chronic obstructive pulmonary disease with (acute) exacerbation: Secondary | ICD-10-CM | POA: Diagnosis present

## 2021-02-22 DIAGNOSIS — Z87891 Personal history of nicotine dependence: Secondary | ICD-10-CM

## 2021-02-22 LAB — CBC
HCT: 40.7 % (ref 39.0–52.0)
Hemoglobin: 14.1 g/dL (ref 13.0–17.0)
MCH: 29.4 pg (ref 26.0–34.0)
MCHC: 34.6 g/dL (ref 30.0–36.0)
MCV: 85 fL (ref 80.0–100.0)
Platelets: 160 10*3/uL (ref 150–400)
RBC: 4.79 MIL/uL (ref 4.22–5.81)
RDW: 12.4 % (ref 11.5–15.5)
WBC: 10.5 10*3/uL (ref 4.0–10.5)
nRBC: 0 % (ref 0.0–0.2)

## 2021-02-22 LAB — TROPONIN I (HIGH SENSITIVITY): Troponin I (High Sensitivity): 11 ng/L

## 2021-02-22 LAB — BASIC METABOLIC PANEL WITH GFR
Anion gap: 9 (ref 5–15)
BUN: 17 mg/dL (ref 8–23)
CO2: 22 mmol/L (ref 22–32)
Calcium: 9 mg/dL (ref 8.9–10.3)
Chloride: 106 mmol/L (ref 98–111)
Creatinine, Ser: 1.22 mg/dL (ref 0.61–1.24)
GFR, Estimated: >60 mL/min
Glucose, Bld: 214 mg/dL — ABNORMAL HIGH (ref 70–99)
Potassium: 4.4 mmol/L (ref 3.5–5.1)
Sodium: 137 mmol/L (ref 135–145)

## 2021-02-22 LAB — LACTIC ACID, PLASMA
Lactic Acid, Venous: 2 mmol/L (ref 0.5–1.9)
Lactic Acid, Venous: 1.8 mmol/L (ref 0.5–1.9)

## 2021-02-22 LAB — PROCALCITONIN: Procalcitonin: 0.17 ng/mL

## 2021-02-22 LAB — RESP PANEL BY RT-PCR (FLU A&B, COVID) ARPGX2
Influenza A by PCR: NEGATIVE
Influenza B by PCR: NEGATIVE
SARS Coronavirus 2 by RT PCR: NEGATIVE

## 2021-02-22 LAB — GLUCOSE, CAPILLARY
Glucose-Capillary: 387 mg/dL — ABNORMAL HIGH (ref 70–99)
Glucose-Capillary: 286 mg/dL — ABNORMAL HIGH (ref 70–99)

## 2021-02-22 LAB — BRAIN NATRIURETIC PEPTIDE: B Natriuretic Peptide: 122.1 pg/mL — ABNORMAL HIGH (ref 0.0–100.0)

## 2021-02-22 MED ORDER — NITROGLYCERIN 0.4 MG SL SUBL: 0.4000 mg | SUBLINGUAL_TABLET | SUBLINGUAL | Status: DC | PRN

## 2021-02-22 MED ORDER — LEVOFLOXACIN IN D5W 750 MG/150ML IV SOLN
750.0000 mg | Freq: Once | INTRAVENOUS | Status: AC
  Administered 2021-02-22: 750 mg via INTRAVENOUS
  Filled 2021-02-22: qty 150

## 2021-02-22 MED ORDER — LEVOFLOXACIN IN D5W 750 MG/150ML IV SOLN
750.0000 mg | INTRAVENOUS | Status: DC
  Administered 2021-02-23 – 2021-02-24 (×2): 750 mg via INTRAVENOUS
  Filled 2021-02-22 (×2): qty 150

## 2021-02-22 MED ORDER — ATORVASTATIN CALCIUM 20 MG PO TABS: 40.0000 mg | ORAL_TABLET | Freq: Every day | ORAL | Status: DC

## 2021-02-22 MED ORDER — ENOXAPARIN SODIUM 40 MG/0.4ML ~~LOC~~ SOLN: 40.0000 mg | SUBCUTANEOUS | Status: DC

## 2021-02-22 MED ORDER — INSULIN ASPART 100 UNIT/ML ~~LOC~~ SOLN
0.0000 [IU] | Freq: Three times a day (TID) | SUBCUTANEOUS | Status: DC
  Administered 2021-02-22: 17:00:00 9 [IU] via SUBCUTANEOUS
  Administered 2021-02-23: 3 [IU] via SUBCUTANEOUS
  Administered 2021-02-23: 5 [IU] via SUBCUTANEOUS
  Administered 2021-02-23: 09:00:00 3 [IU] via SUBCUTANEOUS
  Administered 2021-02-24 (×2): 5 [IU] via SUBCUTANEOUS
  Filled 2021-02-22 (×6): qty 1

## 2021-02-22 MED ORDER — LEVOFLOXACIN IN D5W 750 MG/150ML IV SOLN: 750.0000 mg | Freq: Once | INTRAVENOUS | Status: DC

## 2021-02-22 MED ORDER — DM-GUAIFENESIN ER 30-600 MG PO TB12: 1.0000 | ORAL_TABLET | Freq: Two times a day (BID) | ORAL | Status: DC | PRN

## 2021-02-22 MED ORDER — ALBUTEROL SULFATE HFA 108 (90 BASE) MCG/ACT IN AERS
2.0000 | INHALATION_SPRAY | RESPIRATORY_TRACT | Status: DC | PRN
  Filled 2021-02-22: qty 6.7

## 2021-02-22 MED ORDER — FAMOTIDINE 20 MG PO TABS: 20.0000 mg | ORAL_TABLET | Freq: Every day | ORAL | Status: DC | PRN

## 2021-02-22 MED ORDER — AMLODIPINE BESYLATE 10 MG PO TABS
10.0000 mg | ORAL_TABLET | Freq: Every day | ORAL | Status: DC
  Administered 2021-02-23 – 2021-02-24 (×2): 10 mg via ORAL
  Filled 2021-02-22 (×2): qty 1

## 2021-02-22 MED ORDER — LEVOFLOXACIN IN D5W 750 MG/150ML IV SOLN: 750.0000 mg | INTRAVENOUS | Status: DC

## 2021-02-22 MED ORDER — SODIUM CHLORIDE 0.9 % IV SOLN: 2.0000 g | INTRAVENOUS | Status: DC

## 2021-02-22 MED ORDER — IPRATROPIUM-ALBUTEROL 0.5-2.5 (3) MG/3ML IN SOLN
3.0000 mL | Freq: Once | RESPIRATORY_TRACT | Status: AC
  Administered 2021-02-22: 3 mL via RESPIRATORY_TRACT
  Filled 2021-02-22: qty 3

## 2021-02-22 MED ORDER — SODIUM CHLORIDE 0.9 % IV SOLN: 500.0000 mg | INTRAVENOUS | Status: DC

## 2021-02-22 MED ORDER — ACETAMINOPHEN 325 MG PO TABS: 650.0000 mg | ORAL_TABLET | Freq: Four times a day (QID) | ORAL | Status: DC | PRN

## 2021-02-22 MED ORDER — ASPIRIN 81 MG PO CHEW
81.0000 mg | CHEWABLE_TABLET | Freq: Every day | ORAL | Status: DC
  Administered 2021-02-23 – 2021-02-24 (×2): 81 mg via ORAL
  Filled 2021-02-22 (×2): qty 1

## 2021-02-22 MED ORDER — METHYLPREDNISOLONE SODIUM SUCC 125 MG IJ SOLR
125.0000 mg | INTRAMUSCULAR | Status: AC
  Administered 2021-02-22: 125 mg via INTRAVENOUS
  Filled 2021-02-22: qty 2

## 2021-02-22 MED ORDER — HYDRALAZINE HCL 20 MG/ML IJ SOLN: 5.0000 mg | INTRAMUSCULAR | Status: DC | PRN

## 2021-02-22 MED ORDER — METHYLPREDNISOLONE SODIUM SUCC 40 MG IJ SOLR
40.0000 mg | Freq: Two times a day (BID) | INTRAMUSCULAR | Status: DC
  Administered 2021-02-22 – 2021-02-24 (×4): 40 mg via INTRAVENOUS
  Filled 2021-02-22 (×4): qty 1

## 2021-02-22 MED ORDER — INSULIN ASPART 100 UNIT/ML ~~LOC~~ SOLN
0.0000 [IU] | Freq: Every day | SUBCUTANEOUS | Status: DC
  Administered 2021-02-22: 21:00:00 3 [IU] via SUBCUTANEOUS
  Administered 2021-02-23: 2 [IU] via SUBCUTANEOUS
  Filled 2021-02-22 (×2): qty 1

## 2021-02-22 MED ORDER — ONDANSETRON HCL 4 MG/2ML IJ SOLN: 4.0000 mg | Freq: Three times a day (TID) | INTRAMUSCULAR | Status: DC | PRN

## 2021-02-22 MED ORDER — ATORVASTATIN CALCIUM 20 MG PO TABS
40.0000 mg | ORAL_TABLET | Freq: Every day | ORAL | Status: DC
  Administered 2021-02-23 – 2021-02-24 (×2): 40 mg via ORAL
  Filled 2021-02-22 (×2): qty 2

## 2021-02-22 MED ORDER — CITALOPRAM HYDROBROMIDE 20 MG PO TABS
10.0000 mg | ORAL_TABLET | Freq: Every day | ORAL | Status: DC
  Administered 2021-02-23 – 2021-02-24 (×2): 10 mg via ORAL
  Filled 2021-02-22 (×2): qty 1

## 2021-02-22 NOTE — Consult Note (Signed)
Pharmacy Antibiotic Note  [name] is a 75 y.o. male admitted on 02/22/2021 with pneumonia. Pt presented with cough, chest congestion, and worsening SOB x 1 week. PMH includes COPD, traumatic aortic injury, and CAD. Pt cough has been productive with presence of brown sputum. Pharmacy has been consulted for levofloxacin dosing.  3/26 CXR: Heterogeneous airspace opacity of the right middle lobe, consistent with infection.  Afebrile, WBC 10.5, LA 2.0, Scr 1.22 (baseline ~1)  Plan: Levaquin 750 mg x 1 dose ordered by MD for 3/26. Will order Levaquin 750 mg q24h to start 3/27  Monitor renal function   Height: 5\' 8"  (172.7 cm) Weight: 104.3 kg (230 lb) IBW/kg (Calculated) : 68.4  Temp (24hrs), Avg:98.2 F (36.8 C), Min:98.2 F (36.8 C), Max:98.2 F (36.8 C)  Recent Labs  Lab 02/22/21 0946  WBC 10.5  CREATININE 1.22  LATICACIDVEN 2.0*    Estimated Creatinine Clearance: 63.2 mL/min (by C-G formula based on SCr of 1.22 mg/dL).    Allergies  Allergen Reactions  . Contrast Media [Iodinated Diagnostic Agents] Hives  . Xarelto [Rivaroxaban] Other (See Comments)    GI Bleed  . Clindamycin/Lincomycin Rash  . Dye Fdc Red [Red Dye] Rash  . Lovenox [Enoxaparin Sodium] Rash  . Penicillins Rash and Other (See Comments)    Has patient had a PCN reaction causing immediate rash, facial/tongue/throat swelling, SOB or lightheadedness with hypotension: Unknown Has patient had a PCN reaction causing severe rash involving mucus membranes or skin necrosis: Unknown Has patient had a PCN reaction that required hospitalization: Unknown Has patient had a PCN reaction occurring within the last 10 years: No If all of the above answers are "NO", then may proceed with Cephalosporin use.     Antimicrobials this admission: 3/26 levofloxacin >>  Microbiology results: 3/26 BCx: pending  Thank you for allowing pharmacy to be a part of this patient's care.  Benn Moulder, PharmD Pharmacy  Resident  02/22/2021 11:23 AM

## 2021-02-22 NOTE — H&P (Signed)
History and Physical    [name] MEQ:[id number] DOB: [birth year] DOA: 02/22/2021  Referring MD/NP/PA:   PCP: [name], MD   Patient coming from:  The patient is coming from home.  At baseline, pt is independent for most of ADL.        Chief Complaint: Shortness breath and cough  HPI: [name] is a 75 y.o. male with medical history significant of hypertension, hyperlipidemia, diabetes mellitus, COPD, GERD, depression, upper GI bleeding, PVD, HTN, gallstone pancreatitis, dCHF, CAD, aortic transection due to fall (s/p of repair 2005), OSA on CPAP, who presents with shortness breath and cough.  Patient states that he has been having shortness of breath and cough for almost 1 week, which has worsened today. He coughs up clear mucus.  He has chest congestion, denies active chest pain.  No fever or chills.  Denies nausea vomiting, diarrhea, abdominal pain, symptoms of UTI.  No unilateral weakness.  ED Course: pt was found to have negative COVID PCR, WBC 10.5, lactic acid 2.0, 1.8, troponin level 11, BNP 122, GFR> 60, temperature normal, blood pressure 163/75, heart rate 85, RR 24, oxygen saturation 89% on room air, which improved with 92-97% on 4 L oxygen.  Chest x-ray showed right middle lobe infiltration.  Patient is admitted med-surg bed as inpt  Review of Systems:   General: no fevers, chills, no body weight gain, has fatigue HEENT: no blurry vision, hearing changes or sore throat Respiratory: has dyspnea, coughing, no wheezing CV: no chest pain, no palpitations GI: no nausea, vomiting, abdominal pain, diarrhea, constipation GU: no dysuria, burning on urination, increased urinary frequency, hematuria  Ext: has trace leg edema Neuro: no unilateral weakness, numbness, or tingling, no vision change or hearing loss Skin: no rash, no skin tear. MSK: No muscle spasm, no deformity, no limitation of range of movement in spin Heme: No easy bruising.  Travel history: No recent long  distant travel.  Allergy:  Allergies  Allergen Reactions  . Contrast Media [Iodinated Diagnostic Agents] Hives  . Xarelto [Rivaroxaban] Other (See Comments)    GI Bleed  . Clindamycin/Lincomycin Rash  . Dye Fdc Red [Red Dye] Rash  . Lovenox [Enoxaparin Sodium] Rash  . Penicillins Rash and Other (See Comments)    Has patient had a PCN reaction causing immediate rash, facial/tongue/throat swelling, SOB or lightheadedness with hypotension: Unknown Has patient had a PCN reaction causing severe rash involving mucus membranes or skin necrosis: Unknown Has patient had a PCN reaction that required hospitalization: Unknown Has patient had a PCN reaction occurring within the last 10 years: No If all of the above answers are "NO", then may proceed with Cephalosporin use.     Past Medical History:  Diagnosis Date  . Aortic transection 2005   Traumatic after a fall from a second floor. s/p repair [facility]  . Basal cell carcinoma   . CAD (coronary artery disease) 1992   a. 1992 Acute anterior MI, thrombolytic therapy-->cath reportedly w/o significant CAD; b. 08/2016 MV: EF 57%, hypertensive respons, no ischemia/infarct; c. 04/2017 CTA chest w/ coronary Ca2+.  . Depression   . Diastolic dysfunction    a. 04/2017 Echo: EF 60-65%, Gr1 DD, nl RV fxn.  . Erectile dysfunction   . Fall 2005   fell off house: torn aorta, clavicle fracture, rib fracture, vertebral fractures, lung contusion, coma x 2 weeks  . Fracture of femoral neck, left ([place]) 08/09/2014   ORIF, Dr. Marry Guan  . Fracture of radial neck,  left, closed 08/09/2014  . Gallstone pancreatitis   . Gastric ulcer   . History of ATN 08/09/2014   [facility], 2 days of dialysis (ARF)  . Hyperlipidemia    Statin with joint pain   . Hypertension   . Obesity   . Peripheral vascular disease ([place])    Atherosclerotic:R renal artery stenosis  . Type II diabetes mellitus ([place])   . Upper GI bleed    a. 04/2017 admit w/ melena and HGB down to 7 req prbc's; b.  04/2017 EGD: small HH, non-bleeding gastric & duod ulcers, non-bleeding erosive gastropathy-->PPI Rx.    Past Surgical History:  Procedure Laterality Date  . CARDIAC CATHETERIZATION  08/1991   50 % mid-Lad stenosis with clot, 25-505 second marginal  . CARDIOVASCULAR SURGERY     with ruptured Aorta, Dr. Camila Li, [facility]   . CHOLECYSTECTOMY    . ESOPHAGOGASTRODUODENOSCOPY (EGD) WITH PROPOFOL N/A 05/03/2017   Procedure: ESOPHAGOGASTRODUODENOSCOPY (EGD) WITH PROPOFOL;  Surgeon: Lucilla Lame, MD;  Location: [facility] ENDOSCOPY;  Service: Endoscopy;  Laterality: N/A;  . KNEE SURGERY     Right   . ORIF HIP FRACTURE Left 08/12/2014   Dr. Marry Guan  . THORACOTOMY     thoracic aorta repair   . TRACHEOSTOMY     s/p reversal  . VIDEO BRONCHOSCOPY Bilateral 05/27/2017   Procedure: VIDEO BRONCHOSCOPY WITHOUT FLUORO;  Surgeon: Juanito Doom, MD;  Location: [facility] ENDOSCOPY;  Service: Cardiopulmonary;  Laterality: Bilateral;    Social History:  reports that he quit smoking about 29 years ago. His smoking use included cigarettes. He has a 30.00 pack-year smoking history. He has never used smokeless tobacco. He reports current alcohol use. He reports that he does not use drugs.  Family History:  Family History  Problem Relation Age of Onset  . Dementia Mother 56  . Heart attack Father 69  . Diabetes Brother 37  . Heart attack Brother   . Diabetes Brother   . Colon cancer Neg Hx   . Stomach cancer Neg Hx   . Esophageal cancer Neg Hx   . Rectal cancer Neg Hx      Prior to Admission medications   Medication Sig Start Date End Date Taking? Authorizing Provider  amLODipine (NORVASC) 10 MG tablet Take 1 tablet (10 mg total) by mouth daily. 06/05/20   Copland, Frederico Hamman, MD  aspirin 81 MG chewable tablet Chew 81 mg by mouth daily.    [provider]  atorvastatin (LIPITOR) 40 MG tablet TAKE ONE TABLET BY MOUTH EVERY DAY 07/16/20   Copland, Frederico Hamman, MD  citalopram (CELEXA) 10 MG tablet TAKE ONE TABLET BY  MOUTH EVERY DAY 09/23/20   Copland, Frederico Hamman, MD  famotidine (PEPCID) 20 MG tablet Take 1 tablet (20 mg total) by mouth 2 (two) times daily. 11/20/20 11/20/21  Johnn Hai, PA-C  metFORMIN (GLUCOPHAGE-XR) 500 MG 24 hr tablet TAKE 1 TABLET BY MOUTH DAILY 02/17/21   Copland, Frederico Hamman, MD  nitroGLYCERIN (NITROSTAT) 0.4 MG SL tablet Place 1 tablet (0.4 mg total) under the tongue every 5 (five) minutes as needed for chest pain. 05/19/17   Theora Gianotti, NP    Physical Exam: Vitals:   02/22/21 1000 02/22/21 1015 02/22/21 1227 02/22/21 1230  BP: (!) 163/75  (!) 173/78   Pulse: 78 85 97   Resp: 15 (!) 24 18   Temp:   99.4 F (37.4 C)   TempSrc:   Oral   SpO2: 97% 92% (!) 89% 94%  Weight:  Height:       General: Not in acute distress HEENT:       Eyes: PERRL, EOMI, no scleral icterus.       ENT: No discharge from the ears and nose, no pharynx injection, no tonsillar enlargement.        Neck: No JVD, no bruit, no mass felt. Heme: No neck lymph node enlargement. Cardiac: S1/S2, RRR, No murmurs, No gallops or rubs. Respiratory: Has decreased air movement and rhonchi bilaterally GI: Soft, nondistended, nontender, no rebound pain, no organomegaly, BS present. GU: No hematuria Ext: Has trace leg edema bilaterally. 1+DP/PT pulse bilaterally. Musculoskeletal: No joint deformities, No joint redness or warmth, no limitation of ROM in spin. Skin: No rashes.  Neuro: Alert, oriented X3, cranial nerves II-XII grossly intact, moves all extremities normally.   Psych: Patient is not psychotic, no suicidal or hemocidal ideation.  Labs on Admission: I have personally reviewed following labs and imaging studies  CBC: Recent Labs  Lab 02/22/21 0946  WBC 10.5  HGB 14.1  HCT 40.7  MCV 85.0  PLT 161   Basic Metabolic Panel: Recent Labs  Lab 02/22/21 0946  NA 137  K 4.4  CL 106  CO2 22  GLUCOSE 214*  BUN 17  CREATININE 1.22  CALCIUM 9.0   GFR: Estimated Creatinine  Clearance: 63.2 mL/min (by C-G formula based on SCr of 1.22 mg/dL). Liver Function Tests: No results for input(s): AST, ALT, ALKPHOS, BILITOT, PROT, ALBUMIN in the last 168 hours. No results for input(s): LIPASE, AMYLASE in the last 168 hours. No results for input(s): AMMONIA in the last 168 hours. Coagulation Profile: No results for input(s): INR, PROTIME in the last 168 hours. Cardiac Enzymes: No results for input(s): CKTOTAL, CKMB, CKMBINDEX, TROPONINI in the last 168 hours. BNP (last 3 results) No results for input(s): PROBNP in the last 8760 hours. HbA1C: No results for input(s): HGBA1C in the last 72 hours. CBG: No results for input(s): GLUCAP in the last 168 hours. Lipid Profile: No results for input(s): CHOL, HDL, LDLCALC, TRIG, CHOLHDL, LDLDIRECT in the last 72 hours. Thyroid Function Tests: No results for input(s): TSH, T4TOTAL, FREET4, T3FREE, THYROIDAB in the last 72 hours. Anemia Panel: No results for input(s): VITAMINB12, FOLATE, FERRITIN, TIBC, IRON, RETICCTPCT in the last 72 hours. Urine analysis:    Component Value Date/Time   COLORURINE YELLOW (A) 05/14/2017 1153   APPEARANCEUR CLEAR (A) 05/14/2017 1153   APPEARANCEUR Clear 08/10/2014 0207   LABSPEC 1.015 05/14/2017 1153   LABSPEC 1.014 08/10/2014 0207   PHURINE 5.0 05/14/2017 1153   GLUCOSEU >=500 (A) 05/14/2017 1153   GLUCOSEU Negative 08/10/2014 0207   HGBUR NEGATIVE 05/14/2017 1153   BILIRUBINUR NEGATIVE 05/14/2017 1153   BILIRUBINUR Negative 08/10/2014 0207   KETONESUR NEGATIVE 05/14/2017 1153   PROTEINUR NEGATIVE 05/14/2017 1153   NITRITE NEGATIVE 05/14/2017 1153   LEUKOCYTESUR NEGATIVE 05/14/2017 1153   LEUKOCYTESUR Negative 08/10/2014 0207   Sepsis Labs: @LABRCNTIP (procalcitonin:4,lacticidven:4) ) Recent Results (from the past 240 hour(s))  Resp Panel by RT-PCR (Flu A&B, Covid) Nasopharyngeal Swab     Status: None   Collection Time: 02/22/21 11:30 AM   Specimen: Nasopharyngeal Swab;  Nasopharyngeal(NP) swabs in vial transport medium  Result Value Ref Range Status   SARS Coronavirus 2 by RT PCR NEGATIVE NEGATIVE Final    Comment: (NOTE) SARS-CoV-2 target nucleic acids are NOT DETECTED.  The SARS-CoV-2 RNA is generally detectable in upper respiratory specimens during the acute phase of infection. The lowest concentration of SARS-CoV-2 viral  copies this assay can detect is 138 copies/mL. A negative result does not preclude SARS-Cov-2 infection and should not be used as the sole basis for treatment or other patient management decisions. A negative result may occur with  improper specimen collection/handling, submission of specimen other than nasopharyngeal swab, presence of viral mutation(s) within the areas targeted by this assay, and inadequate number of viral copies(<138 copies/mL). A negative result must be combined with clinical observations, patient history, and epidemiological information. The expected result is Negative.  Fact Sheet for Patients:  EntrepreneurPulse.com.au  Fact Sheet for Healthcare Providers:  IncredibleEmployment.be  This test is no t yet approved or cleared by the [place] FDA and  has been authorized for detection and/or diagnosis of SARS-CoV-2 by FDA under an Emergency Use Authorization (EUA). This EUA will remain  in effect (meaning this test can be used) for the duration of the COVID-19 declaration under Section 564(b)(1) of the Act, 21 U.S.C.section 360bbb-3(b)(1), unless the authorization is terminated  or revoked sooner.       Influenza A by PCR NEGATIVE NEGATIVE Final   Influenza B by PCR NEGATIVE NEGATIVE Final    Comment: (NOTE) The Xpert Xpress SARS-CoV-2/FLU/RSV plus assay is intended as an aid in the diagnosis of influenza from Nasopharyngeal swab specimens and should not be used as a sole basis for treatment. Nasal washings and aspirates are unacceptable for Xpert Xpress  SARS-CoV-2/FLU/RSV testing.  Fact Sheet for Patients: EntrepreneurPulse.com.au  Fact Sheet for Healthcare Providers: IncredibleEmployment.be  This test is not yet approved or cleared by the [place] FDA and has been authorized for detection and/or diagnosis of SARS-CoV-2 by FDA under an Emergency Use Authorization (EUA). This EUA will remain in effect (meaning this test can be used) for the duration of the COVID-19 declaration under Section 564(b)(1) of the Act, 21 U.S.C. section 360bbb-3(b)(1), unless the authorization is terminated or revoked.  Performed at [facility], [address] [place], [place] [address]      Radiological Exams on Admission: DG Chest 2 View  Result Date: 02/22/2021 CLINICAL DATA:  Shortness of breath EXAM: CHEST - 2 VIEW COMPARISON:  05/08/2015 FINDINGS: The heart size and mediastinal contours are within normal limits. Heterogeneous airspace opacity of right middle lobe. Emphysema. Disc degenerative disease of the thoracic spine. IMPRESSION: 1. Heterogeneous airspace opacity of the right middle lobe, consistent with infection. 2. Emphysema. Electronically Signed   By: Eddie Candle M.D.   On: 02/22/2021 09:42     EKG: I have personally reviewed.  Sinus rhythm, QTC 415, PVC, nonspecific T wave change   Assessment/Plan Principal Problem:   CAP (community acquired pneumonia) Active Problems:   DM (diabetes mellitus) type II, controlled, with peripheral vascular disorder ([place])   Hyperlipidemia LDL goal <70   Major depressive disorder, recurrent, in remission ([place])   Essential hypertension   Coronary artery disease of native artery of native heart with stable angina pectoris (HCC)   OSA (obstructive sleep apnea)   Acute respiratory failure with hypoxia (HCC)   Chronic diastolic CHF (congestive heart failure) (HCC)   COPD exacerbation (HCC)  Acute respiratory failure with hypoxia due to CAP and COPD  exacerbation: Chest x-ray showed right middle lobe infiltration for pneumonia.  Patient has rhonchi on auscultation, indicating COPD exacerbation.  No fever.  Clinically not septic.  Hemodynamically stable   - Will admit to med-surg bed as inpt - Levaquin (patient is allergic to penicillin, causing severe rash and facial swelling, will not use Rocephin) -  Mucinex for cough  - Bronchodilators -Solu-Medrol 40 mg twice daily for COPD exacerbation - Urine legionella and S. pneumococcal antigen - Follow up blood culture x2, sputum culture   DM (diabetes mellitus) type II, controlled, with peripheral vascular disorder [facility]): Recent A1c 6.7.  Well controlled.  Patient is taking Metformin -Sliding scale insulin  Hyperlipidemia LDL goal <70 -Lipitor  Major depressive disorder, recurrent, in remission ([place]) -Celexa  Essential hypertension -Amlodipine -IV hydralazine as needed  Coronary artery disease of native artery of native heart with stable angina pectoris [facility]): No chest pain -Aspirin, Lipitor  OSA (obstructive sleep apnea) -CPAP  Chronic diastolic CHF (congestive heart failure) [facility]): Patient has trace leg edema, no pulmonary edema on chest x-ray.  No JVD.  CHF seem to be compensated.  Patient is not taking diuretics.  2D echo on 05/14/2017 showed EF of 60--65% with grade 1 diastolic dysfunction -Check BNP -->122 -Watch volume status closely     DVT ppx: SCD (patient is allergic to Lovenox) Code Status: Full code Family Communication:  Yes, patient's wife   at bed side Disposition Plan:  Anticipate discharge back to previous environment Consults called: None Admission status and Level of care: Med-Surg:    as inpt      Status is: Inpatient  Remains inpatient appropriate because:Inpatient level of care appropriate due to severity of illness   Dispo: The patient is from: Home              Anticipated d/c is to: Home              Patient currently is not medically stable to  d/c.   Difficult to place patient No            Date of Service 02/22/2021    [facility]   If 7PM-7AM, please contact night-coverage [email or web address] 02/22/2021, 2:30 PM

## 2021-02-22 NOTE — ED Triage Notes (Signed)
Pt states he is having some trouble breathing since Sunday- pt started with a cold on Sunday and it has gotten progressively worse until he started having trouble breathing- pt has a hx pf COPD and wears CPAP at night

## 2021-02-22 NOTE — ED Triage Notes (Signed)
First Nurse Note:  Arrives with c/o cough, chest congestion and worsening SOB x 1 week.  Patient has history of  COPD and has exertional SOB at baseline.  Wears CPAP at night.

## 2021-02-22 NOTE — ED Provider Notes (Signed)
[facility] Emergency Department Provider Note   ____________________________________________   Event Date/Time   First MD Initiated Contact with Patient 02/22/21 [phone]     (approximate)  I have reviewed the triage vital signs and the nursing notes.   HISTORY  Chief Complaint Shortness of Breath    HPI [name] is a 75 y.o. male history of previous traumatic aortic injury, COPD in the distant past, coronary disease  Patient presents for evaluation reports for about a week now has had a cough productive of some brown sputum.  Shortness of breath seem to get worse on a daily basis.  Worse when he exerts himself no chest pain.  No leg swelling.  He does feel like he has a rattling cough.  No noted fever  No nausea vomiting.  No chest pain  History of COPD   Past Medical History:  Diagnosis Date  . Aortic transection 2005   Traumatic after a fall from a second floor. s/p repair at [facility]  . Basal cell carcinoma   . CAD (coronary artery disease) 1992   a. 1992 Acute anterior MI, thrombolytic therapy-->cath reportedly w/o significant CAD; b. 08/2016 MV: EF 57%, hypertensive respons, no ischemia/infarct; c. 04/2017 CTA chest w/ coronary Ca2+.  . Depression   . Diastolic dysfunction    a. 04/2017 Echo: EF 60-65%, Gr1 DD, nl RV fxn.  . Erectile dysfunction   . Fall 2005   fell off house: torn aorta, clavicle fracture, rib fracture, vertebral fractures, lung contusion, coma x 2 weeks  . Fracture of femoral neck, left ([place]) 08/09/2014   ORIF, Dr. Marry Guan  . Fracture of radial neck, left, closed 08/09/2014  . Gallstone pancreatitis   . Gastric ulcer   . History of ATN 08/09/2014   [facility], 2 days of dialysis (ARF)  . Hyperlipidemia    Statin with joint pain   . Hypertension   . Obesity   . Peripheral vascular disease ([place])    Atherosclerotic:R renal artery stenosis  . Type II diabetes mellitus ([place])   . Upper GI bleed    a. 04/2017 admit w/ melena and HGB  down to 7 req prbc's; b. 04/2017 EGD: small HH, non-bleeding gastric & duod ulcers, non-bleeding erosive gastropathy-->PPI Rx.    Patient Active Problem List   Diagnosis Date Noted  . OSA (obstructive sleep apnea) 09/15/2017  . Coronary artery disease of native artery of native heart with stable angina pectoris ([place]) 08/18/2017  . COPD (chronic obstructive pulmonary disease) ([place]) 05/14/2017  . Radial neck fracture 09/14/2014  . Fracture of femoral neck, left ([place]) 09/14/2014  . Centrilobular emphysema ([place]) 09/12/2014  . Aortic transection   . Peripheral vascular disease ([place])   . History of MI (myocardial infarction)   . Duodenal ulcer with hemorrhage 04/30/2011  . DM (diabetes mellitus) type II, controlled, with peripheral vascular disorder ([place]) 02/13/2010  . Hyperlipidemia LDL goal <70 08/16/2008  . ERECTILE DYSFUNCTION 08/16/2008  . Major depressive disorder, recurrent, in remission ([place]) 08/16/2008  . Essential hypertension 08/16/2008    Past Surgical History:  Procedure Laterality Date  . CARDIAC CATHETERIZATION  08/1991   50 % mid-[place]ad stenosis with clot, 25-505 second marginal  . CARDIOVASCULAR SURGERY     with ruptured Aorta, Dr. Camila Li, [facility]   . CHOLECYSTECTOMY    . ESOPHAGOGASTRODUODENOSCOPY (EGD) WITH PROPOFOL N/A 05/03/2017   Procedure: ESOPHAGOGASTRODUODENOSCOPY (EGD) WITH PROPOFOL;  Surgeon: Lucilla Lame, MD;  Location: [facility] ENDOSCOPY;  Service: Endoscopy;  Laterality: N/A;  .  KNEE SURGERY     Right   . ORIF HIP FRACTURE Left 08/12/2014   Dr. Marry Guan  . THORACOTOMY     thoracic aorta repair   . TRACHEOSTOMY     s/p reversal  . VIDEO BRONCHOSCOPY Bilateral 05/27/2017   Procedure: VIDEO BRONCHOSCOPY WITHOUT FLUORO;  Surgeon: Juanito Doom, MD;  Location: [facility] ENDOSCOPY;  Service: Cardiopulmonary;  Laterality: Bilateral;    Prior to Admission medications   Medication Sig Start Date End Date Taking? Authorizing Provider  amLODipine (NORVASC) 10 MG tablet  Take 1 tablet (10 mg total) by mouth daily. 06/05/20   Copland, Frederico Hamman, MD  aspirin 81 MG chewable tablet Chew 81 mg by mouth daily.    [provider]  atorvastatin (LIPITOR) 40 MG tablet TAKE ONE TABLET BY MOUTH EVERY DAY 07/16/20   Copland, Frederico Hamman, MD  citalopram (CELEXA) 10 MG tablet TAKE ONE TABLET BY MOUTH EVERY DAY 09/23/20   Copland, Frederico Hamman, MD  famotidine (PEPCID) 20 MG tablet Take 1 tablet (20 mg total) by mouth 2 (two) times daily. 11/20/20 11/20/21  Johnn Hai, PA-C  metFORMIN (GLUCOPHAGE-XR) 500 MG 24 hr tablet TAKE 1 TABLET BY MOUTH DAILY 02/17/21   Copland, Frederico Hamman, MD  nitroGLYCERIN (NITROSTAT) 0.4 MG SL tablet Place 1 tablet (0.4 mg total) under the tongue every 5 (five) minutes as needed for chest pain. 05/19/17   Theora Gianotti, NP    Allergies Contrast media [iodinated diagnostic agents], Xarelto [rivaroxaban], Clindamycin/lincomycin, Dye fdc red [red dye], Lovenox [enoxaparin sodium], and Penicillins  Family History  Problem Relation Age of Onset  . Dementia Mother 3  . Heart attack Father 64  . Diabetes Brother 4  . Heart attack Brother   . Diabetes Brother   . Colon cancer Neg Hx   . Stomach cancer Neg Hx   . Esophageal cancer Neg Hx   . Rectal cancer Neg Hx     Social History Social History   Tobacco Use  . Smoking status: Former Smoker    Packs/day: 1.50    Years: 20.00    Pack years: 30.00    Types: Cigarettes    Quit date: 09/13/1991    Years since quitting: 29.4  . Smokeless tobacco: Never Used  . Tobacco comment: quit post MI  Vaping Use  . Vaping Use: Never used  Substance Use Topics  . Alcohol use: Yes  . Drug use: No    Review of Systems Constitutional: No fever/chills Eyes: No visual changes. ENT: No sore throat. Cardiovascular: Denies chest pain. Respiratory see HPI Gastrointestinal: No abdominal pain.   Genitourinary: Negative for dysuria. Musculoskeletal: Negative for back pain. Skin: Negative for  rash. Neurological: Negative for headaches, areas of focal weakness or numbness.    ____________________________________________   PHYSICAL EXAM:  VITAL SIGNS: ED Triage Vitals [02/22/21 0906]  Enc Vitals Group     BP (!) 163/78     Pulse Rate 92     Resp (!) 24     Temp 98.2 F (36.8 C)     Temp Source Oral     SpO2 90 %     Weight 230 lb (104.3 kg)     Height 5\' 8"  (1.727 m)     Head Circumference      Peak Flow      Pain Score 0     Pain Loc      Pain Edu?      Excl. in [place]?     Constitutional: Alert and oriented.  Well appearing and in no acute distress though appears mildly dyspneic. Eyes: Conjunctivae are normal. Head: Atraumatic. Nose: No congestion/rhinnorhea. Mouth/Throat: Mucous membranes are moist. Neck: No stridor.  Cardiovascular: Normal rate, regular rhythm. Grossly normal heart sounds.  Good peripheral circulation. Respiratory: Mild tachypnea.  Mild use accessory muscles.  Has diffusely increased expiratory phase on exam throughout, but no notable wheezing.  Some central rhonchi.  Left lung primarily clear, right middle right lower lobe with obvious crackles.  Gastrointestinal: Soft and nontender. No distention. Musculoskeletal: No lower extremity tenderness nor edema. Neurologic:  Normal speech and language. No gross focal neurologic deficits are appreciated.  Skin:  Skin is warm, dry and intact. No rash noted. Psychiatric: Mood and affect are normal. Speech and behavior are normal.  ____________________________________________   LABS (all labs ordered are listed, but only abnormal results are displayed)  Labs Reviewed  BASIC METABOLIC PANEL - Abnormal; Notable for the following components:      Result Value   Glucose, Bld 214 (*)    All other components within normal limits  BRAIN NATRIURETIC PEPTIDE - Abnormal; Notable for the following components:   B Natriuretic Peptide 122.1 (*)    All other components within normal limits  LACTIC ACID,  PLASMA - Abnormal; Notable for the following components:   Lactic Acid, Venous 2.0 (*)    All other components within normal limits  CULTURE, BLOOD (ROUTINE X 2)  CULTURE, BLOOD (ROUTINE X 2)  CBC  LACTIC ACID, PLASMA  PROCALCITONIN  POC SARS CORONAVIRUS 2 AG -  ED  TROPONIN I (HIGH SENSITIVITY)  TROPONIN I (HIGH SENSITIVITY)   ____________________________________________  EKG  Reviewed inter by me at 910 Heart rate 89 QRS 99 QTc 420 Normal sinus rhythm, occasional PVC.  No evidence of ischemia ____________________________________________  RADIOLOGY  DG Chest 2 View  Result Date: 02/22/2021 CLINICAL DATA:  Shortness of breath EXAM: CHEST - 2 VIEW COMPARISON:  05/08/2015 FINDINGS: The heart size and mediastinal contours are within normal limits. Heterogeneous airspace opacity of right middle lobe. Emphysema. Disc degenerative disease of the thoracic spine. IMPRESSION: 1. Heterogeneous airspace opacity of the right middle lobe, consistent with infection. 2. Emphysema. Electronically Signed   By: Eddie Candle M.D.   On: 02/22/2021 09:42   Personally viewed images, concerning for right middle lobe airspace disease probably pneumonia based on clinical assessment  ____________________________________________   PROCEDURES  Procedure(s) performed: None  Procedures  Critical Care performed: No  ____________________________________________   INITIAL IMPRESSION / ASSESSMENT AND PLAN / ED COURSE  Pertinent labs & imaging results that were available during my care of the patient were reviewed by me and considered in my medical decision making (see chart for details).   Patient presents for dyspnea.  Evidence of probably some mild bronchospasm will treat with nebs and steroids, but additionally examination and history seem suggestive of likely pneumonia.  Chest x-ray also concerning for same.  No associated chest pain.  No other signs or symptoms of ACS  CBC normal.  Labs including  lactic acid, blood culture sent.  Chemistry reveals mildly elevated glucose.  BNP slightly elevated with normal troponin.  ----------------------------------------- 10:56 AM on 02/22/2021 -----------------------------------------  Patient feeling somewhat improved.  Currently resting well saturations in the low 90s on 2 L nasal cannula.  Lungs have cleared after neb but still has persistent crackles and slight tachypnea.  Will admit for further work-up for concerns of probable community-acquired pneumonia.  No recent hospitalizations or anabiotic use  Additionally, further work-up  under the hospitalist service anticipated    Covid swab pending  ____________________________________________   FINAL CLINICAL IMPRESSION(S) / ED DIAGNOSES  Final diagnoses:  COPD exacerbation ([place])  Community acquired pneumonia of right middle lobe of lung        Note:  This document was prepared using Systems analyst and may include unintentional dictation errors       Delman Kitten, MD 02/22/21 1057

## 2021-02-22 NOTE — Progress Notes (Signed)
cpap declined by pt 

## 2021-02-23 DIAGNOSIS — J189 Pneumonia, unspecified organism: Principal | ICD-10-CM

## 2021-02-23 DIAGNOSIS — J9601 Acute respiratory failure with hypoxia: Secondary | ICD-10-CM

## 2021-02-23 DIAGNOSIS — J441 Chronic obstructive pulmonary disease with (acute) exacerbation: Secondary | ICD-10-CM

## 2021-02-23 LAB — BASIC METABOLIC PANEL WITH GFR
Anion gap: 11 (ref 5–15)
BUN: 25 mg/dL — ABNORMAL HIGH (ref 8–23)
CO2: 18 mmol/L — ABNORMAL LOW (ref 22–32)
Calcium: 9.2 mg/dL (ref 8.9–10.3)
Chloride: 106 mmol/L (ref 98–111)
Creatinine, Ser: 1.17 mg/dL (ref 0.61–1.24)
GFR, Estimated: >60 mL/min
Glucose, Bld: 252 mg/dL — ABNORMAL HIGH (ref 70–99)
Potassium: 4.3 mmol/L (ref 3.5–5.1)
Sodium: 135 mmol/L (ref 135–145)

## 2021-02-23 LAB — CBC
HCT: 39 % (ref 39.0–52.0)
Hemoglobin: 13.7 g/dL (ref 13.0–17.0)
MCH: 29.5 pg (ref 26.0–34.0)
MCHC: 35.1 g/dL (ref 30.0–36.0)
MCV: 83.9 fL (ref 80.0–100.0)
Platelets: 179 10*3/uL (ref 150–400)
RBC: 4.65 MIL/uL (ref 4.22–5.81)
RDW: 12.4 % (ref 11.5–15.5)
WBC: 11.8 10*3/uL — ABNORMAL HIGH (ref 4.0–10.5)
nRBC: 0 % (ref 0.0–0.2)

## 2021-02-23 LAB — GLUCOSE, CAPILLARY
Glucose-Capillary: 228 mg/dL — ABNORMAL HIGH (ref 70–99)
Glucose-Capillary: 239 mg/dL — ABNORMAL HIGH (ref 70–99)
Glucose-Capillary: 272 mg/dL — ABNORMAL HIGH (ref 70–99)
Glucose-Capillary: 212 mg/dL — ABNORMAL HIGH (ref 70–99)

## 2021-02-23 LAB — STREP PNEUMONIAE URINARY ANTIGEN: Strep Pneumo Urinary Antigen: POSITIVE — AB

## 2021-02-23 MED ORDER — HEPARIN SODIUM (PORCINE) 5000 UNIT/ML IJ SOLN
5000.0000 [IU] | Freq: Three times a day (TID) | INTRAMUSCULAR | Status: DC
  Administered 2021-02-23 – 2021-02-24 (×3): 5000 [IU] via SUBCUTANEOUS
  Filled 2021-02-23 (×3): qty 1

## 2021-02-23 NOTE — Progress Notes (Signed)
PROGRESS NOTE    [name]  DDU:[id number] DOB: 1946/09/26 DOA: 02/22/2021 PCP: [name], MD   Assessment & Plan:   Principal Problem:   CAP (community acquired pneumonia) Active Problems:   DM (diabetes mellitus) type II, controlled, with peripheral vascular disorder ([place])   Hyperlipidemia LDL goal <70   Major depressive disorder, recurrent, in remission ([place])   Essential hypertension   Coronary artery disease of native artery of native heart with stable angina pectoris (HCC)   OSA (obstructive sleep apnea)   Acute respiratory failure with hypoxia (HCC)   Chronic diastolic CHF (congestive heart failure) ([place])   COPD exacerbation ([employer])   Acute hypoxic respiratory failure: secondary to CAP & COPD exacerbation. Continue on IV levaquin, IV steroids and bronchodilators. Encourage incentive spirometry. Continue on supplemental oxygen and wean as tolerated  CAP: continue on IV levaquin and bronchodilators. Encourage incentive spirometry. Legionella, strep are pending. Blood cx NGTD  COPD exacerbation: continue on IV steroids and bronchodilators. Encourage incentive spirometry. Continue on supplemental oxygen and wean as tolerated  DM2: well controlled, HbA1c 6.7. Continue on SSI w/ accuchecks   Hyperlipidemia: continue on statin  Major depressive disorder: severity unknown. Continue on home dose of celexa   HTN: continue on home dose of amlodipine. IV hydralazine prn   CAD: continue on statin, aspirin   OSA: continue on CPAP qhs   Chronic diastolic CHF: euvolemic. Echo in 2018 showed EF 23-76%, grade I diastolic dysfunction. Will monitor I/Os   DVT prophylaxis: heparin SQ Code Status: full  Family Communication: discussed pt's care w/ pt's wife at bedside and answered her questions Disposition Plan: likely d/c back home  Level of care: Med-Surg   Status is: Inpatient  Remains inpatient appropriate because:IV treatments appropriate due to intensity of  illness or inability to take PO and Inpatient level of care appropriate due to severity of illness   Dispo: The patient is from: Home              Anticipated d/c is to: Home              Patient currently is not medically stable to d/c.   Difficult to place patient Yes      Consultants:      Procedures:    Antimicrobials: levaquin   Subjective: Pt c/o shortness of breath, improved from day prior.   Objective: Vitals:   02/22/21 1545 02/22/21 1955 02/23/21 0032 02/23/21 0531  BP: (!) 168/69 (!) 152/68 (!) 148/74 (!) 143/81  Pulse: 92 84 80 86  Resp: 20 18 18 20   Temp: 97.8 F (36.6 C) (!) 97.5 F (36.4 C) 98.4 F (36.9 C) 98.1 F (36.7 C)  TempSrc: Oral Oral  Oral  SpO2: 92% 94% 93% 93%  Weight:      Height:        Intake/Output Summary (Last 24 hours) at 02/23/2021 0758 Last data filed at 02/23/2021 2831 Gross per 24 hour  Intake 254.98 ml  Output 1815 ml  Net -1560.02 ml   Filed Weights   02/22/21 0906  Weight: 104.3 kg    Examination:  General exam: Appears calm and comfortable  Respiratory system: Clear to auscultation. Respiratory effort normal. Cardiovascular system: S1 & S2 +. No rubs, gallops or clicks.  Gastrointestinal system: Abdomen is nondistended, soft and nontender. Normal bowel sounds heard. Central nervous system: Alert and oriented. Moves all 4 extremities  Psychiatry: Judgement and insight appear normal. Mood & affect appropriate.     Data  Reviewed: I have personally reviewed following labs and imaging studies  CBC: Recent Labs  Lab 02/22/21 0946 02/23/21 0415  WBC 10.5 11.8*  HGB 14.1 13.7  HCT 40.7 39.0  MCV 85.0 83.9  PLT 160 546   Basic Metabolic Panel: Recent Labs  Lab 02/22/21 0946 02/23/21 0415  NA 137 135  K 4.4 4.3  CL 106 106  CO2 22 18*  GLUCOSE 214* 252*  BUN 17 25*  CREATININE 1.22 1.17  CALCIUM 9.0 9.2   GFR: Estimated Creatinine Clearance: 65.9 mL/min (by C-G formula based on SCr of 1.17  mg/dL). Liver Function Tests: No results for input(s): AST, ALT, ALKPHOS, BILITOT, PROT, ALBUMIN in the last 168 hours. No results for input(s): LIPASE, AMYLASE in the last 168 hours. No results for input(s): AMMONIA in the last 168 hours. Coagulation Profile: No results for input(s): INR, PROTIME in the last 168 hours. Cardiac Enzymes: No results for input(s): CKTOTAL, CKMB, CKMBINDEX, TROPONINI in the last 168 hours. BNP (last 3 results) No results for input(s): PROBNP in the last 8760 hours. HbA1C: No results for input(s): HGBA1C in the last 72 hours. CBG: Recent Labs  Lab 02/22/21 1601 02/22/21 2028  GLUCAP 387* 286*   Lipid Profile: No results for input(s): CHOL, HDL, LDLCALC, TRIG, CHOLHDL, LDLDIRECT in the last 72 hours. Thyroid Function Tests: No results for input(s): TSH, T4TOTAL, FREET4, T3FREE, THYROIDAB in the last 72 hours. Anemia Panel: No results for input(s): VITAMINB12, FOLATE, FERRITIN, TIBC, IRON, RETICCTPCT in the last 72 hours. Sepsis Labs: Recent Labs  Lab 02/22/21 0946 02/22/21 1141  PROCALCITON 0.17  --   LATICACIDVEN 2.0* 1.8    Recent Results (from the past 240 hour(s))  Culture, blood (Routine X 2) w Reflex to ID Panel     Status: None (Preliminary result)   Collection Time: 02/22/21  9:46 AM   Specimen: BLOOD  Result Value Ref Range Status   Specimen Description BLOOD RIGHT ANTECUBITAL  Final   Special Requests   Final    BOTTLES DRAWN AEROBIC AND ANAEROBIC Blood Culture results may not be optimal due to an excessive volume of blood received in culture bottles   Culture   Final    NO GROWTH < 24 HOURS Performed at [facility], [address] [place], [place] [address]    Report Status PENDING  Incomplete  Culture, blood (Routine X 2) w Reflex to ID Panel     Status: None (Preliminary result)   Collection Time: 02/22/21  9:51 AM   Specimen: BLOOD  Result Value Ref Range Status   Specimen Description BLOOD LEFT ANTECUBITAL   Final   Special Requests   Final    BOTTLES DRAWN AEROBIC AND ANAEROBIC Blood Culture adequate volume   Culture   Final    NO GROWTH < 24 HOURS Performed at [facility], [address], [place], [place] [address]    Report Status PENDING  Incomplete  Resp Panel by RT-PCR (Flu A&B, Covid) Nasopharyngeal Swab     Status: None   Collection Time: 02/22/21 11:30 AM   Specimen: Nasopharyngeal Swab; Nasopharyngeal(NP) swabs in vial transport medium  Result Value Ref Range Status   SARS Coronavirus 2 by RT PCR NEGATIVE NEGATIVE Final    Comment: (NOTE) SARS-CoV-2 target nucleic acids are NOT DETECTED.  The SARS-CoV-2 RNA is generally detectable in upper respiratory specimens during the acute phase of infection. The lowest concentration of SARS-CoV-2 viral copies this assay can detect is 138 copies/mL. A negative  result does not preclude SARS-Cov-2 infection and should not be used as the sole basis for treatment or other patient management decisions. A negative result may occur with  improper specimen collection/handling, submission of specimen other than nasopharyngeal swab, presence of viral mutation(s) within the areas targeted by this assay, and inadequate number of viral copies(<138 copies/mL). A negative result must be combined with clinical observations, patient history, and epidemiological information. The expected result is Negative.  Fact Sheet for Patients:  EntrepreneurPulse.com.au  Fact Sheet for Healthcare Providers:  IncredibleEmployment.be  This test is no t yet approved or cleared by the [place] FDA and  has been authorized for detection and/or diagnosis of SARS-CoV-2 by FDA under an Emergency Use Authorization (EUA). This EUA will remain  in effect (meaning this test can be used) for the duration of the COVID-19 declaration under Section 564(b)(1) of the Act, 21 U.S.C.section 360bbb-3(b)(1), unless the authorization  is terminated  or revoked sooner.       Influenza A by PCR NEGATIVE NEGATIVE Final   Influenza B by PCR NEGATIVE NEGATIVE Final    Comment: (NOTE) The Xpert Xpress SARS-CoV-2/FLU/RSV plus assay is intended as an aid in the diagnosis of influenza from Nasopharyngeal swab specimens and should not be used as a sole basis for treatment. Nasal washings and aspirates are unacceptable for Xpert Xpress SARS-CoV-2/FLU/RSV testing.  Fact Sheet for Patients: EntrepreneurPulse.com.au  Fact Sheet for Healthcare Providers: IncredibleEmployment.be  This test is not yet approved or cleared by the [place] FDA and has been authorized for detection and/or diagnosis of SARS-CoV-2 by FDA under an Emergency Use Authorization (EUA). This EUA will remain in effect (meaning this test can be used) for the duration of the COVID-19 declaration under Section 564(b)(1) of the Act, 21 U.S.C. section 360bbb-3(b)(1), unless the authorization is terminated or revoked.  Performed at [facility], [address], [place], [place] [address]          Radiology Studies: DG Chest 2 View  Result Date: 02/22/2021 CLINICAL DATA:  Shortness of breath EXAM: CHEST - 2 VIEW COMPARISON:  05/08/2015 FINDINGS: The heart size and mediastinal contours are within normal limits. Heterogeneous airspace opacity of right middle lobe. Emphysema. Disc degenerative disease of the thoracic spine. IMPRESSION: 1. Heterogeneous airspace opacity of the right middle lobe, consistent with infection. 2. Emphysema. Electronically Signed   By: Eddie Candle M.D.   On: 02/22/2021 09:42        Scheduled Meds: . amLODipine  10 mg Oral Daily  . aspirin  81 mg Oral Daily  . atorvastatin  40 mg Oral Daily  . citalopram  10 mg Oral Daily  . insulin aspart  0-5 Units Subcutaneous QHS  . insulin aspart  0-9 Units Subcutaneous TID WC  . methylPREDNISolone (SOLU-MEDROL) injection  40 mg  Intravenous Q12H   Continuous Infusions: . levofloxacin (LEVAQUIN) IV       LOS: 1 day    Time spent: 32 mins     Wyvonnia Dusky, MD [facility] Pager [phone]  If 7PM-7AM, please contact night-coverage 02/23/2021, 7:58 AM

## 2021-02-23 NOTE — Plan of Care (Signed)
End of Shift Summary:  Alert and oriented x4. VSS. Remained on 2L via [place], no c/o resp distress. Audible wheezing on ambulation to bathroom, oxygen sats remain >93%. Denies pain or n/v. (+) BM and adequate urine output. Blood glucose monitoring. Remained free from falls or injury. Call bell within reach and able to use.   Problem: Education: Goal: Knowledge of General Education information will improve Description: Including pain rating scale, medication(s)/side effects and non-pharmacologic comfort measures Outcome: Progressing   Problem: Clinical Measurements: Goal: Ability to maintain clinical measurements within normal limits will improve Outcome: Progressing Goal: Respiratory complications will improve Outcome: Progressing   Problem: Activity: Goal: Risk for activity intolerance will decrease Outcome: Progressing   Problem: Safety: Goal: Ability to remain free from injury will improve Outcome: Progressing   Problem: Respiratory: Goal: Ability to maintain adequate ventilation will improve Outcome: Progressing

## 2021-02-23 NOTE — Evaluation (Signed)
Physical Therapy Evaluation Patient Details Name: [name] MRN: [id number] DOB: Jun 27, 1946 Today's Date: 02/23/2021   History of Present Illness  Pt is a 75 y/o M admitted from home on 02/22/21 with c/c of SOB & cough x 1 week. Pt being treated for ARF with hypoxia due to CAP & COPD exacerbation. PMH: HTN, HLD, DM, COPD, GERD, depression, upper GI bleeding, PVD, HTN, gallstone pancreatitis, dCHF, CAD, aortic transection due to fall (s/p repair 2005), OSA on CPAP, L femoral neck fx (2015), L radial fx (2015), obesity  Clinical Impression  Pt seen for PT evaluation with wife present. During session pt was able to ambulate 3 laps around the nurses station without AD, mod I, without LOB noted. PT reviewed use of incentive spirometer. Pt on room air throughout session, with lowest SpO2 of 87% at rest in bed at beginning of session, up to 88-90% with gait, and 92% at rest at end of session. Reviewed need for pt to use rail during stair negotiation & have wife provide supervision just for added safety, but no concerns with task at this time (unable to practice in stair well 2/2 pt recently hooked up to IV meds). At this time PT will sign off, please re consult if new needs arise.     Follow Up Recommendations No PT follow up (pulmonary rehab)    Equipment Recommendations  None recommended by PT    Recommendations for Other Services       Precautions / Restrictions Precautions Precautions: None Restrictions Weight Bearing Restrictions: No      Mobility  Bed Mobility Overal bed mobility: Modified Independent             General bed mobility comments: HOB elevated    Transfers Overall transfer level: Modified independent Equipment used: None             General transfer comment: no physical assistance needed and no LOB noted for sit<>stand  Ambulation/Gait Ambulation/Gait assistance: Modified independent (Device/Increase time) Gait Distance (Feet): 600 Feet Assistive  device: None Gait Pattern/deviations: WFL(Within Functional Limits) Gait velocity: slightly decreased      Stairs            Wheelchair Mobility    Modified Rankin (Stroke Patients Only)       Balance Overall balance assessment: Modified Independent                                           Pertinent Vitals/Pain Pain Assessment: No/denies pain    Home Living Family/patient expects to be discharged to:: Private residence Living Arrangements: Spouse/significant other Available Help at Discharge: Family;Available PRN/intermittently Type of Home: House Home Access: Stairs to enter Entrance Stairs-Rails: Right Entrance Stairs-Number of Steps: 4-5 in front and "11-13" in back Home Layout: Two level;Able to live on main level with bedroom/bathroom;Laundry or work area in [place]: Environmental consultant - 2 wheels      Prior Function Level of Independence: Independent         Comments: Pt reports being I in self care tasks, IADLs, driving, and ambulation. Works part time as Therapist, nutritional: Right    Extremity/Trunk Assessment   Upper Extremity Assessment Upper Extremity Assessment: Overall WFL for tasks assessed    Lower Extremity Assessment Lower Extremity Assessment: Overall WFL for tasks assessed  Communication   Communication: No difficulties  Cognition Arousal/Alertness: Awake/alert Behavior During Therapy: WFL for tasks assessed/performed Overall Cognitive Status: Within Functional Limits for tasks assessed                                        General Comments General comments (skin integrity, edema, etc.): Pt on room air throughout session. SpO2 87% at rest in bed, increases to 88-90% with pursed lip breathing & during gait, up to 92% at end of session - nurse made aware    Exercises     Assessment/Plan    PT Assessment Patent does not need any further PT  services  PT Problem List         PT Treatment Interventions      PT Goals (Current goals can be found in the Care Plan section)  Acute Rehab PT Goals Patient Stated Goal: to go home PT Goal Formulation: With patient/family Time For Goal Achievement: 03/09/21 Potential to Achieve Goals: Good    Frequency     Barriers to discharge        Co-evaluation               AM-PAC PT "6 Clicks" Mobility  Outcome Measure Help needed turning from your back to your side while in a flat bed without using bedrails?: None Help needed moving from lying on your back to sitting on the side of a flat bed without using bedrails?: None Help needed moving to and from a bed to a chair (including a wheelchair)?: None Help needed standing up from a chair using your arms (e.g., wheelchair or bedside chair)?: None Help needed to walk in hospital room?: None Help needed climbing 3-5 steps with a railing? : A Little 6 Click Score: 23    End of Session Equipment Utilized During Treatment: Gait belt Activity Tolerance: Patient tolerated treatment well Patient left: in bed;with bed alarm set;with call bell/phone within reach Nurse Communication: Mobility status (O2)      Time: 0881-1031 PT Time Calculation (min) (ACUTE ONLY): 14 min   Charges:   PT Evaluation $PT Eval Low Complexity: [place], PT, DPT 02/23/21, 12:08 PM   Waunita Schooner 02/23/2021, 12:06 PM

## 2021-02-23 NOTE — Evaluation (Signed)
Occupational Therapy Evaluation Patient Details Name: [name] MRN: [id number] DOB: 12-13-1945 Today's Date: 02/23/2021    History of Present Illness 75 y.o. male admitted on 02/22/2021 with pneumonia. Pt presented with cough, chest congestion, and worsening SOB x 1 week. PMH includes COPD, traumatic aortic injury, and CAD.   Clinical Impression   Pt supine in bed with wife present in the room. Pt with no c/o pain and cooperative for OT intervention this session. Pt reports living at home with wife and being independent without use of AD at baseline. Pt reports being very active and still driving. Pt on 2 L O2 via [place] which was removed during this session and monitored with activity. Pt ambulating without use of AD without assistance or LOB. Pt's O2 desats to 90 but quickly removed to 94% with rest. RN notified and agreeable to leaving pt on RA at end of session. Pt demonstrated self care tasks without assistance as well. Pt with no skilled OT need at this time. OT to SIGN OFF. Thank you for this referral.     Follow Up Recommendations  No OT follow up    Equipment Recommendations  None recommended by OT       Precautions / Restrictions Precautions Precautions: Fall      Mobility Bed Mobility Overal bed mobility: Modified Independent             General bed mobility comments: HOB elevated    Transfers Overall transfer level: Modified independent               General transfer comment: no physical assistance needed and no LOB noted    Balance Overall balance assessment: Modified Independent                                         ADL either performed or assessed with clinical judgement   ADL Overall ADL's : Independent                                             Vision Patient Visual Report: No change from baseline              Pertinent Vitals/Pain Pain Assessment: No/denies pain     Hand Dominance Right    Extremity/Trunk Assessment Upper Extremity Assessment Upper Extremity Assessment: Overall WFL for tasks assessed   Lower Extremity Assessment Lower Extremity Assessment: Overall WFL for tasks assessed       Communication Communication Communication: No difficulties   Cognition Arousal/Alertness: Awake/alert Behavior During Therapy: WFL for tasks assessed/performed Overall Cognitive Status: Within Functional Limits for tasks assessed                                                Home Living Family/patient expects to be discharged to:: Private residence Living Arrangements: Spouse/significant other Available Help at Discharge: Family;Available PRN/intermittently Type of Home: House Home Access: Stairs to enter [employer] of Steps: 4-5 in front and "11-13" in back Entrance Stairs-Rails: Right;Left Home Layout: Two level;Able to live on main level with bedroom/bathroom     Bathroom Shower/Tub: Tub/shower unit  Home Equipment: [employer] - 2 wheels          Prior Functioning/Environment Level of Independence: Independent        Comments: Pt reports being I in self care tasks, IADLs, driving, and ambulation.                 OT Goals(Current goals can be found in the care plan section) Acute Rehab OT Goals Patient Stated Goal: to go home OT Goal Formulation: With patient/family Potential to Achieve Goals: Good   AM-PAC OT "6 Clicks" Daily Activity     Outcome Measure Help from another person eating meals?: None Help from another person taking care of personal grooming?: None Help from another person toileting, which includes using toliet, bedpan, or urinal?: None Help from another person bathing (including washing, rinsing, drying)?: None Help from another person to put on and taking off regular upper body clothing?: None Help from another person to put on and taking off regular lower body clothing?: None 6 Click Score: 24    End of Session Nurse Communication: Mobility status  Activity Tolerance: Patient tolerated treatment well Patient left: in bed;with family/visitor present                   Time: 0950-1011 OT Time Calculation (min): 21 min Charges:  OT General Charges $OT Visit: 1 Visit OT Evaluation $OT Eval Low Complexity: 1 Low OT Treatments $Therapeutic Activity: 8-22 mins  Darleen Crocker, MS, OTR/L , CBIS ascom [phone]  02/23/21, 12:00 PM

## 2021-02-24 ENCOUNTER — Telehealth: Payer: Self-pay

## 2021-02-24 LAB — CBC
HCT: 39.7 % (ref 39.0–52.0)
Hemoglobin: 13.7 g/dL (ref 13.0–17.0)
MCH: 29.3 pg (ref 26.0–34.0)
MCHC: 34.5 g/dL (ref 30.0–36.0)
MCV: 84.8 fL (ref 80.0–100.0)
Platelets: 230 10*3/uL (ref 150–400)
RBC: 4.68 MIL/uL (ref 4.22–5.81)
RDW: 12.4 % (ref 11.5–15.5)
WBC: 14.5 10*3/uL — ABNORMAL HIGH (ref 4.0–10.5)
nRBC: 0 % (ref 0.0–0.2)

## 2021-02-24 LAB — BASIC METABOLIC PANEL WITH GFR
Anion gap: 9 (ref 5–15)
BUN: 31 mg/dL — ABNORMAL HIGH (ref 8–23)
CO2: 22 mmol/L (ref 22–32)
Calcium: 8.9 mg/dL (ref 8.9–10.3)
Chloride: 107 mmol/L (ref 98–111)
Creatinine, Ser: 1.1 mg/dL (ref 0.61–1.24)
GFR, Estimated: >60 mL/min
Glucose, Bld: 241 mg/dL — ABNORMAL HIGH (ref 70–99)
Potassium: 4.9 mmol/L (ref 3.5–5.1)
Sodium: 138 mmol/L (ref 135–145)

## 2021-02-24 LAB — GLUCOSE, CAPILLARY
Glucose-Capillary: 289 mg/dL — ABNORMAL HIGH (ref 70–99)
Glucose-Capillary: 276 mg/dL — ABNORMAL HIGH (ref 70–99)

## 2021-02-24 LAB — LEGIONELLA PNEUMOPHILA SEROGP 1 UR AG: L. pneumophila Serogp 1 Ur Ag: NEGATIVE

## 2021-02-24 MED ORDER — PREDNISONE 20 MG PO TABS: 40.0000 mg | ORAL_TABLET | Freq: Every day | ORAL | 0 refills | Status: AC

## 2021-02-24 MED ORDER — LEVOFLOXACIN 750 MG PO TABS: 750.0000 mg | ORAL_TABLET | Freq: Every day | ORAL | 0 refills | Status: AC

## 2021-02-24 MED ORDER — ASPIRIN 81 MG PO CHEW
CHEWABLE_TABLET | ORAL | Status: AC
  Filled 2021-02-24: qty 1

## 2021-02-24 MED ORDER — INSULIN ASPART 100 UNIT/ML ~~LOC~~ SOLN
SUBCUTANEOUS | Status: AC
  Filled 2021-02-24: qty 1

## 2021-02-24 NOTE — Discharge Summary (Signed)
Physician Discharge Summary  [name] FGH:[id number] DOB: 01/07/1946 DOA: 02/22/2021  PCP: [name], MD  Admit date: 02/22/2021 Discharge date: 02/24/2021  Admitted From: home  Disposition:  Home   Recommendations for Outpatient Follow-up:  1. Follow up with PCP in 1-2 weeks   Home Health: no  Equipment/Devices: n/a  Discharge Condition: stable  CODE STATUS: full  Diet recommendation: Heart Healthy / Carb Modified  Brief/Interim Summary: HPI was taken from Dr. Blaine Hamper: [name] is a 75 y.o. male with medical history significant of hypertension, hyperlipidemia, diabetes mellitus, COPD, GERD, depression, upper GI bleeding, PVD, HTN, gallstone pancreatitis, dCHF, CAD, aortic transection due to fall (s/p of repair 2005), OSA on CPAP, who presents with shortness breath and cough.  Patient states that he has been having shortness of breath and cough for almost 1 week, which has worsened today. He coughs up clear mucus.  He has chest congestion, denies active chest pain.  No fever or chills.  Denies nausea vomiting, diarrhea, abdominal pain, symptoms of UTI.  No unilateral weakness.  ED Course: pt was found to have negative COVID PCR, WBC 10.5, lactic acid 2.0, 1.8, troponin level 11, BNP 122, GFR> 60, temperature normal, blood pressure 163/75, heart rate 85, RR 24, oxygen saturation 89% on room air, which improved with 92-97% on 4 L oxygen.  Chest x-ray showed right middle lobe infiltration.  Patient is admitted med-surg bed as [place] from Dr. Jimmye Norman 3/27-3/28/22: Pt presented w/ shortness of breath and cough that was secondary to CAP. Pt was treated on IV levaquin, IV steroids, bronchodilators, incentive spirometry & supplemental oxygen. The supplemental oxygen was weaned off prior to d/c. PT/OT evaluated the pt and did not recommend anymore therapy. For more information, please see other progress notes.   Discharge Diagnoses:  Principal Problem:   CAP  (community acquired pneumonia) Active Problems:   DM (diabetes mellitus) type II, controlled, with peripheral vascular disorder ([place])   Hyperlipidemia LDL goal <70   Major depressive disorder, recurrent, in remission ([place])   Essential hypertension   Coronary artery disease of native artery of native heart with stable angina pectoris (HCC)   OSA (obstructive sleep apnea)   Acute respiratory failure with hypoxia (HCC)   Chronic diastolic CHF (congestive heart failure) (HCC)   COPD exacerbation (HCC)  Acute hypoxic respiratory failure: secondary to CAP & COPD exacerbation. Continue on IV levaquin, IV steroids and bronchodilators. Encourage incentive spirometry. Weaned off of supplemental oxygen. Resolved   CAP: continue on levaquin and bronchodilators. Encourage incentive spirometry. Legionella is pending still. Blood cx NGTD  COPD exacerbation: continue on steroids and bronchodilators. Encourage incentive spirometry. Continue on supplemental oxygen and wean as tolerated  DM2: well controlled, HbA1c 6.7. Continue on SSI w/ accuchecks   Hyperlipidemia: continue on statin  Major depressive disorder: severity unknown. Continue on home dose of celexa   HTN: continue on home dose of amlodipine. IV hydralazine prn   CAD: continue on statin, aspirin   OSA: continue on CPAP qhs   Chronic diastolic CHF: euvolemic. Echo in 2018 showed EF 67-89%, grade I diastolic dysfunction. Will monitor I/Os   Discharge Instructions  Discharge Instructions    Diet - low sodium heart healthy   Complete by: As directed    Diet Carb Modified   Complete by: As directed    Discharge instructions   Complete by: As directed    F/u PCP in 1-2 weeks   Increase activity slowly   Complete  by: As directed      Allergies as of 02/24/2021      Reactions   Contrast Media [iodinated Diagnostic Agents] Hives   Xarelto [rivaroxaban] Other (See Comments)   GI Bleed   Clindamycin/lincomycin Rash   Dye Fdc  Red [red Dye] Rash   Lovenox [enoxaparin Sodium] Rash   Penicillins Rash, Other (See Comments)   Has patient had a PCN reaction causing immediate rash, facial/tongue/throat swelling, SOB or lightheadedness with hypotension: Unknown Has patient had a PCN reaction causing severe rash involving mucus membranes or skin necrosis: Unknown Has patient had a PCN reaction that required hospitalization: Unknown Has patient had a PCN reaction occurring within the last 10 years: No If all of the above answers are "NO", then may proceed with Cephalosporin use.      Medication List    TAKE these medications   amLODipine 10 MG tablet Commonly known as: NORVASC Take 1 tablet (10 mg total) by mouth daily.   aspirin 81 MG chewable tablet Chew 81 mg by mouth daily.   atorvastatin 40 MG tablet Commonly known as: LIPITOR TAKE ONE TABLET BY MOUTH EVERY DAY   citalopram 10 MG tablet Commonly known as: CELEXA TAKE ONE TABLET BY MOUTH EVERY DAY   famotidine 20 MG tablet Commonly known as: PEPCID Take 1 tablet (20 mg total) by mouth 2 (two) times daily.   levofloxacin 750 MG tablet Commonly known as: Levaquin Take 1 tablet (750 mg total) by mouth daily for 5 days.   metFORMIN 500 MG 24 hr tablet Commonly known as: GLUCOPHAGE-XR TAKE 1 TABLET BY MOUTH DAILY What changed: when to take this   nitroGLYCERIN 0.4 MG SL tablet Commonly known as: NITROSTAT Place 1 tablet (0.4 mg total) under the tongue every 5 (five) minutes as needed for chest pain.   predniSONE 20 MG tablet Commonly known as: DELTASONE Take 2 tablets (40 mg total) by mouth daily for 5 days.       Allergies  Allergen Reactions  . Contrast Media [Iodinated Diagnostic Agents] Hives  . Xarelto [Rivaroxaban] Other (See Comments)    GI Bleed  . Clindamycin/Lincomycin Rash  . Dye Fdc Red [Red Dye] Rash  . Lovenox [Enoxaparin Sodium] Rash  . Penicillins Rash and Other (See Comments)    Has patient had a PCN reaction causing  immediate rash, facial/tongue/throat swelling, SOB or lightheadedness with hypotension: Unknown Has patient had a PCN reaction causing severe rash involving mucus membranes or skin necrosis: Unknown Has patient had a PCN reaction that required hospitalization: Unknown Has patient had a PCN reaction occurring within the last 10 years: No If all of the above answers are "NO", then may proceed with Cephalosporin use.     Consultations:     Procedures/Studies: DG Chest 2 View  Result Date: 02/22/2021 CLINICAL DATA:  Shortness of breath EXAM: CHEST - 2 VIEW COMPARISON:  05/08/2015 FINDINGS: The heart size and mediastinal contours are within normal limits. Heterogeneous airspace opacity of right middle lobe. Emphysema. Disc degenerative disease of the thoracic spine. IMPRESSION: 1. Heterogeneous airspace opacity of the right middle lobe, consistent with infection. 2. Emphysema. Electronically Signed   By: Eddie Candle M.D.   On: 02/22/2021 09:42      Subjective: Pt c/o fatigue    Discharge Exam: Vitals:   02/24/21 0740 02/24/21 0906  BP: (!) 158/72 (!) 146/72  Pulse: 82 77  Resp: 18 18  Temp: 97.8 F (36.6 C) 99.1 F (37.3 C)  SpO2: 95% 92%  Vitals:   02/23/21 2113 02/24/21 0404 02/24/21 0740 02/24/21 0906  BP: (!) 153/75 (!) 158/74 (!) 158/72 (!) 146/72  Pulse: 76 87 82 77  Resp: 18 16 18 18   Temp: 98.1 F (36.7 C) 97.6 F (36.4 C) 97.8 F (36.6 C) 99.1 F (37.3 C)  TempSrc:  Oral  Oral  SpO2: 94% 98% 95% 92%  Weight:      Height:        General: Pt is alert, awake, not in acute distress Cardiovascular: S1/S2 +, no rubs, no gallops Respiratory: diminished breath sounds b/l otherwise clear  Abdominal: Soft, NT, obese, bowel sounds + Extremities: no cyanosis    The results of significant diagnostics from this hospitalization (including imaging, microbiology, ancillary and laboratory) are listed below for reference.     Microbiology: Recent Results (from the  past 240 hour(s))  Culture, blood (Routine X 2) w Reflex to ID Panel     Status: None (Preliminary result)   Collection Time: 02/22/21  9:46 AM   Specimen: BLOOD  Result Value Ref Range Status   Specimen Description BLOOD RIGHT ANTECUBITAL  Final   Special Requests   Final    BOTTLES DRAWN AEROBIC AND ANAEROBIC Blood Culture results may not be optimal due to an excessive volume of blood received in culture bottles   Culture   Final    NO GROWTH 2 DAYS Performed at [facility], [address], [place], [place] [address]    Report Status PENDING  Incomplete  Culture, blood (Routine X 2) w Reflex to ID Panel     Status: None (Preliminary result)   Collection Time: 02/22/21  9:51 AM   Specimen: BLOOD  Result Value Ref Range Status   Specimen Description BLOOD LEFT ANTECUBITAL  Final   Special Requests   Final    BOTTLES DRAWN AEROBIC AND ANAEROBIC Blood Culture adequate volume   Culture   Final    NO GROWTH 2 DAYS Performed at [facility], [address] [place], [place] [address]    Report Status PENDING  Incomplete  Resp Panel by RT-PCR (Flu A&B, Covid) Nasopharyngeal Swab     Status: None   Collection Time: 02/22/21 11:30 AM   Specimen: Nasopharyngeal Swab; Nasopharyngeal(NP) swabs in vial transport medium  Result Value Ref Range Status   SARS Coronavirus 2 by RT PCR NEGATIVE NEGATIVE Final    Comment: (NOTE) SARS-CoV-2 target nucleic acids are NOT DETECTED.  The SARS-CoV-2 RNA is generally detectable in upper respiratory specimens during the acute phase of infection. The lowest concentration of SARS-CoV-2 viral copies this assay can detect is 138 copies/mL. A negative result does not preclude SARS-Cov-2 infection and should not be used as the sole basis for treatment or other patient management decisions. A negative result may occur with  improper specimen collection/handling, submission of specimen other than nasopharyngeal swab, presence of viral  mutation(s) within the areas targeted by this assay, and inadequate number of viral copies(<138 copies/mL). A negative result must be combined with clinical observations, patient history, and epidemiological information. The expected result is Negative.  Fact Sheet for Patients:  EntrepreneurPulse.com.au  Fact Sheet for Healthcare Providers:  IncredibleEmployment.be  This test is no t yet approved or cleared by the [place] FDA and  has been authorized for detection and/or diagnosis of SARS-CoV-2 by FDA under an Emergency Use Authorization (EUA). This EUA will remain  in effect (meaning this test can be used) for the duration of the COVID-19 declaration under  Section 564(b)(1) of the Act, 21 U.S.C.section 360bbb-3(b)(1), unless the authorization is terminated  or revoked sooner.       Influenza A by PCR NEGATIVE NEGATIVE Final   Influenza B by PCR NEGATIVE NEGATIVE Final    Comment: (NOTE) The Xpert Xpress SARS-CoV-2/FLU/RSV plus assay is intended as an aid in the diagnosis of influenza from Nasopharyngeal swab specimens and should not be used as a sole basis for treatment. Nasal washings and aspirates are unacceptable for Xpert Xpress SARS-CoV-2/FLU/RSV testing.  Fact Sheet for Patients: EntrepreneurPulse.com.au  Fact Sheet for Healthcare Providers: IncredibleEmployment.be  This test is not yet approved or cleared by the [place] FDA and has been authorized for detection and/or diagnosis of SARS-CoV-2 by FDA under an Emergency Use Authorization (EUA). This EUA will remain in effect (meaning this test can be used) for the duration of the COVID-19 declaration under Section 564(b)(1) of the Act, 21 U.S.C. section 360bbb-3(b)(1), unless the authorization is terminated or revoked.  Performed at [facility], [address] [place], [place] [address]      Labs: BNP (last 3  results) Recent Labs    02/22/21 0946  BNP 786.7*   Basic Metabolic Panel: Recent Labs  Lab 02/22/21 0946 02/23/21 0415 02/24/21 0249  NA 137 135 138  K 4.4 4.3 4.9  CL 106 106 107  CO2 22 18* 22  GLUCOSE 214* 252* 241*  BUN 17 25* 31*  CREATININE 1.22 1.17 1.10  CALCIUM 9.0 9.2 8.9   Liver Function Tests: No results for input(s): AST, ALT, ALKPHOS, BILITOT, PROT, ALBUMIN in the last 168 hours. No results for input(s): LIPASE, AMYLASE in the last 168 hours. No results for input(s): AMMONIA in the last 168 hours. CBC: Recent Labs  Lab 02/22/21 0946 02/23/21 0415 02/24/21 0249  WBC 10.5 11.8* 14.5*  HGB 14.1 13.7 13.7  HCT 40.7 39.0 39.7  MCV 85.0 83.9 84.8  PLT 160 179 230   Cardiac Enzymes: No results for input(s): CKTOTAL, CKMB, CKMBINDEX, TROPONINI in the last 168 hours. BNP: Invalid input(s): POCBNP CBG: Recent Labs  Lab 02/23/21 1215 02/23/21 1609 02/23/21 2114 02/24/21 0743 02/24/21 1113  GLUCAP 239* 272* 212* 289* 276*   D-Dimer No results for input(s): DDIMER in the last 72 hours. Hgb A1c No results for input(s): HGBA1C in the last 72 hours. Lipid Profile No results for input(s): CHOL, HDL, LDLCALC, TRIG, CHOLHDL, LDLDIRECT in the last 72 hours. Thyroid function studies No results for input(s): TSH, T4TOTAL, T3FREE, THYROIDAB in the last 72 hours.  Invalid input(s): FREET3 Anemia work up No results for input(s): VITAMINB12, FOLATE, FERRITIN, TIBC, IRON, RETICCTPCT in the last 72 hours. Urinalysis    Component Value Date/Time   COLORURINE YELLOW (A) 05/14/2017 1153   APPEARANCEUR CLEAR (A) 05/14/2017 1153   APPEARANCEUR Clear 08/10/2014 0207   LABSPEC 1.015 05/14/2017 1153   LABSPEC 1.014 08/10/2014 0207   PHURINE 5.0 05/14/2017 1153   GLUCOSEU >=500 (A) 05/14/2017 1153   GLUCOSEU Negative 08/10/2014 0207   HGBUR NEGATIVE 05/14/2017 1153   BILIRUBINUR NEGATIVE 05/14/2017 1153   BILIRUBINUR Negative 08/10/2014 0207   KETONESUR NEGATIVE  05/14/2017 1153   PROTEINUR NEGATIVE 05/14/2017 1153   NITRITE NEGATIVE 05/14/2017 1153   LEUKOCYTESUR NEGATIVE 05/14/2017 1153   LEUKOCYTESUR Negative 08/10/2014 0207   Sepsis Labs Invalid input(s): PROCALCITONIN,  WBC,  LACTICIDVEN Microbiology Recent Results (from the past 240 hour(s))  Culture, blood (Routine X 2) w Reflex to ID Panel     Status: None (Preliminary  result)   Collection Time: 02/22/21  9:46 AM   Specimen: BLOOD  Result Value Ref Range Status   Specimen Description BLOOD RIGHT ANTECUBITAL  Final   Special Requests   Final    BOTTLES DRAWN AEROBIC AND ANAEROBIC Blood Culture results may not be optimal due to an excessive volume of blood received in culture bottles   Culture   Final    NO GROWTH 2 DAYS Performed at [facility], [address] [place], [place] [address]    Report Status PENDING  Incomplete  Culture, blood (Routine X 2) w Reflex to ID Panel     Status: None (Preliminary result)   Collection Time: 02/22/21  9:51 AM   Specimen: BLOOD  Result Value Ref Range Status   Specimen Description BLOOD LEFT ANTECUBITAL  Final   Special Requests   Final    BOTTLES DRAWN AEROBIC AND ANAEROBIC Blood Culture adequate volume   Culture   Final    NO GROWTH 2 DAYS Performed at [facility], [address] [place], [place] [address]    Report Status PENDING  Incomplete  Resp Panel by RT-PCR (Flu A&B, Covid) Nasopharyngeal Swab     Status: None   Collection Time: 02/22/21 11:30 AM   Specimen: Nasopharyngeal Swab; Nasopharyngeal(NP) swabs in vial transport medium  Result Value Ref Range Status   SARS Coronavirus 2 by RT PCR NEGATIVE NEGATIVE Final    Comment: (NOTE) SARS-CoV-2 target nucleic acids are NOT DETECTED.  The SARS-CoV-2 RNA is generally detectable in upper respiratory specimens during the acute phase of infection. The lowest concentration of SARS-CoV-2 viral copies this assay can detect is 138 copies/mL. A negative result does  not preclude SARS-Cov-2 infection and should not be used as the sole basis for treatment or other patient management decisions. A negative result may occur with  improper specimen collection/handling, submission of specimen other than nasopharyngeal swab, presence of viral mutation(s) within the areas targeted by this assay, and inadequate number of viral copies(<138 copies/mL). A negative result must be combined with clinical observations, patient history, and epidemiological information. The expected result is Negative.  Fact Sheet for Patients:  EntrepreneurPulse.com.au  Fact Sheet for Healthcare Providers:  IncredibleEmployment.be  This test is no t yet approved or cleared by the [place] FDA and  has been authorized for detection and/or diagnosis of SARS-CoV-2 by FDA under an Emergency Use Authorization (EUA). This EUA will remain  in effect (meaning this test can be used) for the duration of the COVID-19 declaration under Section 564(b)(1) of the Act, 21 U.S.C.section 360bbb-3(b)(1), unless the authorization is terminated  or revoked sooner.       Influenza A by PCR NEGATIVE NEGATIVE Final   Influenza B by PCR NEGATIVE NEGATIVE Final    Comment: (NOTE) The Xpert Xpress SARS-CoV-2/FLU/RSV plus assay is intended as an aid in the diagnosis of influenza from Nasopharyngeal swab specimens and should not be used as a sole basis for treatment. Nasal washings and aspirates are unacceptable for Xpert Xpress SARS-CoV-2/FLU/RSV testing.  Fact Sheet for Patients: EntrepreneurPulse.com.au  Fact Sheet for Healthcare Providers: IncredibleEmployment.be  This test is not yet approved or cleared by the [place] FDA and has been authorized for detection and/or diagnosis of SARS-CoV-2 by FDA under an Emergency Use Authorization (EUA). This EUA will remain in effect (meaning this test can be used) for the  duration of the COVID-19 declaration under Section 564(b)(1) of the Act, 21 U.S.C. section 360bbb-3(b)(1), unless the authorization is  terminated or revoked.  Performed at [facility], [address] [place], [place] [address]      Time coordinating discharge: Over 30 minutes  SIGNED:   Wyvonnia Dusky, MD  [facility] 02/24/2021, 11:27 AM Pager   If 7PM-7AM, please contact night-coverage

## 2021-02-24 NOTE — Progress Notes (Signed)
Patient Saturations on Room Air at Rest = 92%  Patient Saturations on Room Air while Ambulating = 91%   Pt was able to ambulate 300 feet on room air. No dizziness, weakness, or shortness of breath.

## 2021-02-24 NOTE — Progress Notes (Signed)
I agree with student's note

## 2021-02-24 NOTE — Telephone Encounter (Signed)
Transition Care Management Follow-up Telephone Call  Date of discharge and from where: 02/24/2021, [facility]   How have you been since you were released from the hospital? Patient states he is doing okay.  Any questions or concerns? No  Items Reviewed:  Did the pt receive and understand the discharge instructions provided? Yes   Medications obtained and verified? Yes   Other? No   Any new allergies since your discharge? No   Dietary orders reviewed? Yes  Do you have support at home? Yes   Home Care and Equipment/Supplies: Were home health services ordered? not applicable If so, what is the name of the agency? N/A  Has the agency set up a time to come to the patient's home? not applicable Were any new equipment or medical supplies ordered?  No What is the name of the medical supply agency? N/A Were you able to get the supplies/equipment? not applicable Do you have any questions related to the use of the equipment or supplies? No  Functional Questionnaire: (I = Independent and D = Dependent) ADLs: I  Bathing/Dressing- I  Meal Prep- I  Eating- I  Maintaining continence- I  Transferring/Ambulation- I  Managing Meds- I  Follow up appointments reviewed:   PCP Hospital f/u appt confirmed? No  Patient declined to schedule appointment. States he will call back later and make appointment with Dr. Lorelei Pont.  [facility] f/u appt confirmed? N/A   Are transportation arrangements needed? No   If their condition worsens, is the pt aware to call PCP or go to the Emergency Dept.? Yes  Was the patient provided with contact information for the PCP's office or ED? Yes  Was to pt encouraged to call back with questions or concerns? Yes

## 2021-02-27 LAB — CULTURE, BLOOD (ROUTINE X 2)
Culture: NO GROWTH
Culture: NO GROWTH
Special Requests: ADEQUATE

## 2021-03-05 ENCOUNTER — Ambulatory Visit: Payer: PPO | Admitting: Cardiovascular Disease

## 2021-03-06 ENCOUNTER — Ambulatory Visit (INDEPENDENT_AMBULATORY_CARE_PROVIDER_SITE_OTHER): Payer: PPO | Admitting: Family Medicine

## 2021-03-06 ENCOUNTER — Encounter: Payer: Self-pay | Admitting: Family Medicine

## 2021-03-06 ENCOUNTER — Other Ambulatory Visit: Payer: Self-pay

## 2021-03-06 VITALS — BP 120/60 | HR 88 | Temp 98.5°F | Ht 68.0 in | Wt 214.5 lb

## 2021-03-06 DIAGNOSIS — J9601 Acute respiratory failure with hypoxia: Secondary | ICD-10-CM | POA: Diagnosis not present

## 2021-03-06 DIAGNOSIS — J189 Pneumonia, unspecified organism: Secondary | ICD-10-CM | POA: Diagnosis not present

## 2021-03-06 DIAGNOSIS — E1151 Type 2 diabetes mellitus with diabetic peripheral angiopathy without gangrene: Secondary | ICD-10-CM | POA: Diagnosis not present

## 2021-03-06 DIAGNOSIS — J441 Chronic obstructive pulmonary disease with (acute) exacerbation: Secondary | ICD-10-CM | POA: Diagnosis not present

## 2021-03-06 LAB — HEPATIC FUNCTION PANEL
ALT: 22 U/L (ref 0–53)
AST: 15 U/L (ref 0–37)
Albumin: 4.1 g/dL (ref 3.5–5.2)
Alkaline Phosphatase: 93 U/L (ref 39–117)
Bilirubin, Direct: 0.2 mg/dL (ref 0.0–0.3)
Total Bilirubin: 1.1 mg/dL (ref 0.2–1.2)
Total Protein: 7 g/dL (ref 6.0–8.3)

## 2021-03-06 LAB — CBC WITH DIFFERENTIAL/PLATELET
Basophils Absolute: 0 10*3/uL (ref 0.0–0.1)
Basophils Relative: 0.3 % (ref 0.0–3.0)
Eosinophils Absolute: 0.2 10*3/uL (ref 0.0–0.7)
Eosinophils Relative: 1 % (ref 0.0–5.0)
HCT: 39.7 % (ref 39.0–52.0)
Hemoglobin: 13.6 g/dL (ref 13.0–17.0)
Lymphocytes Relative: 6.5 % — ABNORMAL LOW (ref 12.0–46.0)
Lymphs Abs: 1.1 10*3/uL (ref 0.7–4.0)
MCHC: 34.2 g/dL (ref 30.0–36.0)
MCV: 84.6 fl (ref 78.0–100.0)
Monocytes Absolute: 1 10*3/uL (ref 0.1–1.0)
Monocytes Relative: 6.2 % (ref 3.0–12.0)
Neutro Abs: 14.5 10*3/uL — ABNORMAL HIGH (ref 1.4–7.7)
Neutrophils Relative %: 86 % — ABNORMAL HIGH (ref 43.0–77.0)
Platelets: 276 10*3/uL (ref 150.0–400.0)
RBC: 4.69 Mil/uL (ref 4.22–5.81)
RDW: 12.9 % (ref 11.5–15.5)
WBC: 16.9 10*3/uL — ABNORMAL HIGH (ref 4.0–10.5)

## 2021-03-06 LAB — BASIC METABOLIC PANEL WITH GFR
BUN: 20 mg/dL (ref 6–23)
CO2: 26 meq/L (ref 19–32)
Calcium: 9.4 mg/dL (ref 8.4–10.5)
Chloride: 103 meq/L (ref 96–112)
Creatinine, Ser: 1.23 mg/dL (ref 0.40–1.50)
GFR: 58.13 mL/min — ABNORMAL LOW
Glucose, Bld: 181 mg/dL — ABNORMAL HIGH (ref 70–99)
Potassium: 4.6 meq/L (ref 3.5–5.1)
Sodium: 137 meq/L (ref 135–145)

## 2021-03-06 LAB — HEMOGLOBIN A1C: Hgb A1c MFr Bld: 8 % — ABNORMAL HIGH (ref 4.6–6.5)

## 2021-03-06 MED ORDER — ALBUTEROL SULFATE HFA 108 (90 BASE) MCG/ACT IN AERS: 2.0000 | INHALATION_SPRAY | Freq: Four times a day (QID) | RESPIRATORY_TRACT | 3 refills | Status: DC | PRN

## 2021-03-06 MED ORDER — FLUTICASONE-SALMETEROL 250-50 MCG/DOSE IN AEPB: 1.0000 | INHALATION_SPRAY | Freq: Two times a day (BID) | RESPIRATORY_TRACT | 3 refills | Status: DC

## 2021-03-06 NOTE — Progress Notes (Signed)
Colton Navejas T. Emeline Simpson, MD, [place] at [facility] [place], [address]  Phone: [phone]  FAX: Welcome - 75 y.o. male  MRN [id number]  Date of Birth: Jun 05, 1946  Date: 03/06/2021  PCP: [name], MD  Referral: [name], MD  Chief Complaint  Patient presents with  . Hospitalization Follow-up    This visit occurred during the SARS-CoV-2 public health emergency.  Safety protocols were in place, including screening questions prior to the visit, additional usage of staff PPE, and extensive cleaning of exam room while observing appropriate contact time as indicated for disinfecting solutions.   Subjective:   [name] is a 75 y.o. very pleasant male patient who presents with the following:  Admit date: 02/22/2021 Discharge date: 02/24/2021  He was admitted to the hospital on the above date with COPD exacerbation, acute hypoxic respiratory failure with approximately 89% oxygen on room air.  He required 4 L of O2, and then his O2 sats normalized.  He also had community-acquired pneumonia, and he was given Levaquin.  On further questioning with his wife he is actually had some worsening breathing of recent months and years.  Does have known COPD, and at this point he has not been on regular inhalers.  On his prior PFTs he does have COPD at a moderate level.  He does have some right side pain, and did have a right middle lobe pneumonia.  His blood sugars have still been running around 300 and he has been off of steroids for about 1 week.  Now has some abdominal complaints.   Review of Systems is noted in the HPI, as appropriate  Objective:   BP 120/60   Pulse 88   Temp 98.5 F (36.9 C) (Temporal)   Ht 5\' 8"  (1.727 m)   Wt 214 lb 8 oz (97.3 kg)   SpO2 93%   BMI 32.61 kg/m   GEN: WDWN, NAD, Non-toxic HEENT: Atraumatic, Normocephalic. Neck  supple. No masses. CV: RRR, No M/G/R. No JVD. No thrill. No extra heart sounds. PULM: CTA B, no wheezes, crackles, rhonchi. No retractions. No resp. distress. No accessory muscle use. EXTR: No c/c/e NEURO Normal gait.  PSYCH: Normally interactive. Conversant.   Laboratory and Imaging Data:  DG Chest 2 View  Result Date: 02/22/2021 CLINICAL DATA:  Shortness of breath EXAM: CHEST - 2 VIEW COMPARISON:  05/08/2015 FINDINGS: The heart size and mediastinal contours are within normal limits. Heterogeneous airspace opacity of right middle lobe. Emphysema. Disc degenerative disease of the thoracic spine. IMPRESSION: 1. Heterogeneous airspace opacity of the right middle lobe, consistent with infection. 2. Emphysema. Electronically Signed   By: [name] M.D.   On: 02/22/2021 09:42     Assessment and Plan:     ICD-10-CM   1. Acute hypoxemic respiratory failure (HCC)  J96.01 CBC with Differential/Platelet    Hepatic function panel    Ambulatory referral to Pulmonology  2. COPD exacerbation (HCC)  J44.1 CBC with Differential/Platelet    Hepatic function panel    Ambulatory referral to Pulmonology  3. Community acquired pneumonia of right middle lobe of lung  J18.9 CBC with Differential/Platelet    Hepatic function panel  4. DM (diabetes mellitus) type II, controlled, with peripheral vascular disorder ([place])  E33.29 Basic metabolic panel    Hemoglobin A1c   I think that he needs to be on scheduled inhalers, and I  am going to start him on some Advair.  His lungs are clear at this point, without any physical exam signs of lingering pneumonia.  Blood sugars have been quite high, and my suspicion is that he has been high for a while, and I will check an A1c today.  I plan to slowly increase with a 24-month follow-up.  Given his recent hypoxic respiratory failure as well as known COPD, I think it would be helpful for him to follow-up with pulmonology as well to make sure that he is optimized.  Meds  ordered this encounter  Medications  . albuterol (VENTOLIN HFA) 108 (90 Base) MCG/ACT inhaler    Sig: Inhale 2 puffs into the lungs every 6 (six) hours as needed for wheezing or shortness of breath.    Dispense:  1 each    Refill:  3  . Fluticasone-Salmeterol (ADVAIR DISKUS) 250-50 MCG/DOSE AEPB    Sig: Inhale 1 puff into the lungs 2 (two) times daily.    Dispense:  1 each    Refill:  3   Medications Discontinued During This Encounter  Medication Reason  . famotidine (PEPCID) 20 MG tablet Completed Course   Orders Placed This Encounter  Procedures  . Basic metabolic panel  . CBC with Differential/Platelet  . Hepatic function panel  . Hemoglobin A1c  . Ambulatory referral to Pulmonology    Follow-up: Return in about 3 months (around 06/05/2021) for diabetes.  Signed,  Maud Deed. Corrina Steffensen, MD   Outpatient Encounter Medications as of 03/06/2021  Medication Sig  . albuterol (VENTOLIN HFA) 108 (90 Base) MCG/ACT inhaler Inhale 2 puffs into the lungs every 6 (six) hours as needed for wheezing or shortness of breath.  [name] Kitchen amLODipine (NORVASC) 10 MG tablet Take 1 tablet (10 mg total) by mouth daily.  [name] Kitchen aspirin 81 MG chewable tablet Chew 81 mg by mouth daily.  [name] Kitchen atorvastatin (LIPITOR) 40 MG tablet TAKE ONE TABLET BY MOUTH EVERY DAY  . citalopram (CELEXA) 10 MG tablet TAKE ONE TABLET BY MOUTH EVERY DAY  . Fluticasone-Salmeterol (ADVAIR DISKUS) 250-50 MCG/DOSE AEPB Inhale 1 puff into the lungs 2 (two) times daily.  . metFORMIN (GLUCOPHAGE-XR) 500 MG 24 hr tablet TAKE 1 TABLET BY MOUTH DAILY  . nitroGLYCERIN (NITROSTAT) 0.4 MG SL tablet Place 1 tablet (0.4 mg total) under the tongue every 5 (five) minutes as needed for chest pain.  . [DISCONTINUED] famotidine (PEPCID) 20 MG tablet Take 1 tablet (20 mg total) by mouth 2 (two) times daily. (Patient not taking: Reported on 02/22/2021)   No facility-administered encounter medications on file as of 03/06/2021.

## 2021-03-14 MED ORDER — METFORMIN HCL ER 500 MG PO TB24: 1500.0000 mg | ORAL_TABLET | Freq: Every day | ORAL | 1 refills | Status: DC

## 2021-03-14 NOTE — Addendum Note (Signed)
Addended by: [name] on: 03/14/2021 11:45 AM   Modules accepted: Orders

## 2021-03-27 ENCOUNTER — Other Ambulatory Visit: Payer: Self-pay

## 2021-03-27 ENCOUNTER — Ambulatory Visit: Payer: PPO | Admitting: Internal Medicine

## 2021-03-27 ENCOUNTER — Encounter: Payer: Self-pay | Admitting: Internal Medicine

## 2021-03-27 VITALS — BP 144/72 | HR 76 | Temp 98.2°F | Ht 67.0 in | Wt 215.2 lb

## 2021-03-27 DIAGNOSIS — J441 Chronic obstructive pulmonary disease with (acute) exacerbation: Secondary | ICD-10-CM

## 2021-03-27 DIAGNOSIS — G4733 Obstructive sleep apnea (adult) (pediatric): Secondary | ICD-10-CM | POA: Diagnosis not present

## 2021-03-27 NOTE — Progress Notes (Signed)
Name: [name] MRN: [id number] DOB: 01/02/1946     CONSULTATION DATE: 03/27/2021  REFERRING MD : Lorelei Pont  CHIEF COMPLAINT: assessment for SOB  STUDIES:     CT chest Independently reviewed by Me today from June 2018  [place] C/W EMPHYSEMA   CXR 01/2021 RT lung opacity pneumonia most likely diagnosis         HISTORY OF PRESENT ILLNESS: 75 year old pleasant white male seen today for assessment for underlying COPD as well as follow-up hospitalization for right lung pneumonia  Patient has a previous diagnosis of COPD 5 or 6 years ago Patient currently takes Advair 250-50 daily  Patient with recent hospital admission to the hospital 3 days Patient discharged on antibiotics and prednisone therapy Patient diagnosed with right middle lung pneumonia with COPD exacerbation  Follow-up in our office today after recent hospital hospitalization Patient seems to be back to baseline in terms of his breathing There is no fevers no cough no chills No wheezing  No exacerbation at this time No evidence of heart failure at this time No evidence or signs of infection at this time No respiratory distress No fevers, chills, nausea, vomiting, diarrhea No evidence of lower extremity edema No evidence hemoptysis   Patient has a diagnosis of obstructive sleep apnea Based on 2018 sleep study results patient has an AHI of 50 and REM and supine position and on average has a AHI of 24 Since admission to the hospital patient has not been using his CPAP and date is not available for that time However prior to his admission to the hospital patient has very good compliance with using his CPAP daily and greater than 4 hours His AHI is reduced to 0.7 He is on auto CPAP 5 to 20 cm of water pressure I have reviewed the results with the patient in detail and at this time he is doing an excellent job and his sleep apnea is under control as long as he uses his CPAP   I have  reviewed the CT scan from 2018 which showed extensive bilateral emphysematous changes  I have also reviewed the chest x-ray from February 25, 2021 that showed right middle lung pneumonia Follow-up chest x-ray as needed in 3 months to assess for resolution  PAST MEDICAL HISTORY :   has a past medical history of Aortic transection (2005), Basal cell carcinoma, CAD (coronary artery disease) (1992), Depression, Diastolic dysfunction, Erectile dysfunction, Fall (2005), Fracture of femoral neck, left ([place]) (08/09/2014), Fracture of radial neck, left, closed (08/09/2014), Gallstone pancreatitis, Gastric ulcer, History of ATN (08/09/2014), Hyperlipidemia, Hypertension, Obesity, Peripheral vascular disease ([place]), Type II diabetes mellitus ([place]), and Upper GI bleed.  has a past surgical history that includes Cholecystectomy; Cardiovascular surgery; Knee surgery; Thoracotomy; Tracheostomy; ORIF hip fracture (Left, 08/12/2014); Esophagogastroduodenoscopy (egd) with propofol (N/A, 05/03/2017); Cardiac catheterization (08/1991); and Video bronchoscopy (Bilateral, 05/27/2017). Prior to Admission medications   Medication Sig Start Date End Date Taking? Authorizing Provider  albuterol (VENTOLIN HFA) 108 (90 Base) MCG/ACT inhaler Inhale 2 puffs into the lungs every 6 (six) hours as needed for wheezing or shortness of breath. 03/06/21 03/06/22  Copland, Frederico Hamman, MD  amLODipine (NORVASC) 10 MG tablet Take 1 tablet (10 mg total) by mouth daily. 06/05/20   Copland, Frederico Hamman, MD  aspirin 81 MG chewable tablet Chew 81 mg by mouth daily.    [provider]  atorvastatin (LIPITOR) 40 MG tablet TAKE ONE TABLET BY MOUTH EVERY DAY 07/16/20   [name], MD  citalopram (  CELEXA) 10 MG tablet TAKE ONE TABLET BY MOUTH EVERY DAY 09/23/20   Copland, Frederico Hamman, MD  Fluticasone-Salmeterol (ADVAIR DISKUS) 250-50 MCG/DOSE AEPB Inhale 1 puff into the lungs 2 (two) times daily. 03/06/21   Copland, Frederico Hamman, MD  metFORMIN (GLUCOPHAGE-XR) 500 MG 24  hr tablet Take 3 tablets (1,500 mg total) by mouth daily. 03/14/21   Copland, Frederico Hamman, MD  nitroGLYCERIN (NITROSTAT) 0.4 MG SL tablet Place 1 tablet (0.4 mg total) under the tongue every 5 (five) minutes as needed for chest pain. 05/19/17   Theora Gianotti, NP   Allergies  Allergen Reactions  . Contrast Media [Iodinated Diagnostic Agents] Hives  . Xarelto [Rivaroxaban] Other (See Comments)    GI Bleed  . Clindamycin/Lincomycin Rash  . Dye Fdc Red [Red Dye] Rash  . Lovenox [Enoxaparin Sodium] Rash  . Penicillins Rash and Other (See Comments)    Has patient had a PCN reaction causing immediate rash, facial/tongue/throat swelling, SOB or lightheadedness with hypotension: Unknown Has patient had a PCN reaction causing severe rash involving mucus membranes or skin necrosis: Unknown Has patient had a PCN reaction that required hospitalization: Unknown Has patient had a PCN reaction occurring within the last 10 years: No If all of the above answers are "NO", then may proceed with Cephalosporin use.     FAMILY HISTORY:  family history includes Dementia (age of onset: 16) in his mother; Diabetes in his brother; Diabetes (age of onset: 56) in his brother; Heart attack in his brother; Heart attack (age of onset: 31) in his father. SOCIAL HISTORY:  reports that he quit smoking about 29 years ago. His smoking use included cigarettes. He has a 30.00 pack-year smoking history. He has never used smokeless tobacco. He reports current alcohol use. He reports that he does not use drugs.    Review of Systems:  Gen:  Denies  fever, sweats, chills weigh loss  HEENT: Denies blurred vision, double vision, ear pain, eye pain, hearing loss, nose bleeds, sore throat Cardiac:  No dizziness, chest pain or heaviness, chest tightness,edema, No JVD Resp:   No cough, -sputum production, +shortness of breath,-wheezing, -hemoptysis,  Gi: Denies swallowing difficulty, stomach pain, nausea or vomiting, diarrhea,  constipation, bowel incontinence Gu:  Denies bladder incontinence, burning urine Ext:   Denies Joint pain, stiffness or swelling Skin: Denies  skin rash, easy bruising or bleeding or hives Endoc:  Denies polyuria, polydipsia , polyphagia or weight change Psych:   Denies depression, insomnia or hallucinations  Other:  All other systems negative  BP (!) 144/72 (BP Location: Left Arm, Patient Position: Sitting, Cuff Size: Normal)   Pulse 76   Temp 98.2 F (36.8 C) (Temporal)   Ht 5\' 7"  (1.702 m)   Wt 215 lb 3.2 oz (97.6 kg)   SpO2 96%   BMI 33.71 kg/m    Physical Examination:   GENERAL:NAD, no fevers, chills, no weakness no fatigue HEAD: Normocephalic, atraumatic.  EYES: PERLA, EOMI No scleral icterus.  NECK: Supple.  PULMONARY: CTA B/L no wheezing, rhonchi, crackles CARDIOVASCULAR: S1 and S2. Regular rate and rhythm. No murmurs GASTROINTESTINAL: Soft, nontender, nondistended. Positive bowel sounds.  MUSCULOSKELETAL: No swelling, clubbing, or edema.  NEUROLOGIC: No gross focal neurological deficits. 5/5 strength all extremities SKIN: No ulceration, lesions, rashes, or cyanosis.  PSYCHIATRIC: Insight, judgment intact. -depression -anxiety ALL OTHER ROS ARE NEGATIVE   MEDICATIONS: I have reviewed all medications and confirmed regimen as documented       ASSESSMENT AND PLAN SYNOPSIS  75 year old pleasant white male  seen today for estimate of COPD and OSA in the setting of deconditioned state who is a former smoker quit 1992 with recent bouts of right middle lung pneumonia and COPD exacerbation  COPD Gold stage D at this time based on symptoms and CT chest Patient will need pulmonary function testing in the near future but would recommend in the next 6 to 10 months Continue Advair as prescribed Albuterol as needed Avoid secondhand smoke exposure  Right lung pneumonia abnormal chest x-ray Follow-up chest x-ray needed in 3 months to assess for interval assessment  COPD  with exertional dyspnea Patient will need 6-minute walk test to assess for exertional hypoxia   Severe OSA Patient has excellent compliance report when using CPAP machine Continue as prescribed Auto CPAP 5 to 20 cm water pressure AHI down to 0.7     COVID-19 EDUCATION: The signs and symptoms of COVID-19 were discussed with the patient. Importance of hand Hygiene and Social Distancing   MEDICATION ADJUSTMENTS/LABS AND TESTS ORDERED: Continue Advair as prescribed Continue albuterol as  Follow-up chest x-ray in 3 months Check 6-minute walk test for exertional hypoxia  CURRENT MEDICATIONS REVIEWED AT LENGTH WITH PATIENT TODAY   Patient/Family are satisfied with Plan of action and management. All questions answered  Follow up  3 months  Jhoel Stieg Patricia Pesa, M.D.  [facility]  Medical Director [place] Director [facility] Cardio-Pulmonary Department

## 2021-03-27 NOTE — Patient Instructions (Addendum)
GREAT JOB WITH CPAP! A+!!  Continue Advair as prescribed Albuterol as needed  Follow up CXR in 3 months  Check 6 minute walk test

## 2021-04-26 DIAGNOSIS — H1032 Unspecified acute conjunctivitis, left eye: Secondary | ICD-10-CM | POA: Diagnosis not present

## 2021-05-02 DIAGNOSIS — S0502XA Injury of conjunctiva and corneal abrasion without foreign body, left eye, initial encounter: Secondary | ICD-10-CM | POA: Diagnosis not present

## 2021-05-05 DIAGNOSIS — S0502XD Injury of conjunctiva and corneal abrasion without foreign body, left eye, subsequent encounter: Secondary | ICD-10-CM | POA: Diagnosis not present

## 2021-05-06 DIAGNOSIS — S0502XD Injury of conjunctiva and corneal abrasion without foreign body, left eye, subsequent encounter: Secondary | ICD-10-CM | POA: Diagnosis not present

## 2021-05-08 DIAGNOSIS — H169 Unspecified keratitis: Secondary | ICD-10-CM | POA: Diagnosis not present

## 2021-05-09 DIAGNOSIS — H169 Unspecified keratitis: Secondary | ICD-10-CM | POA: Diagnosis not present

## 2021-05-12 DIAGNOSIS — H169 Unspecified keratitis: Secondary | ICD-10-CM | POA: Diagnosis not present

## 2021-05-15 DIAGNOSIS — H169 Unspecified keratitis: Secondary | ICD-10-CM | POA: Diagnosis not present

## 2021-05-23 DIAGNOSIS — H169 Unspecified keratitis: Secondary | ICD-10-CM | POA: Diagnosis not present

## 2021-06-10 NOTE — Progress Notes (Signed)
[name] T. Cheryln Balcom, MD, [place] at [facility] [place], [address]  Phone: [phone]  FAX: [phone]  [name] - 75 y.o. male  MRN [id number]  Date of Birth: 03/22/1946  Date: 06/11/2021  PCP: [name], MD  Referral: [name], MD  Chief Complaint  Patient presents with   Diabetes    This visit occurred during the SARS-CoV-2 public health emergency.  Safety protocols were in place, including screening questions prior to the visit, additional usage of staff PPE, and extensive cleaning of exam room while observing appropriate contact time as indicated for disinfecting solutions.   Subjective:   [name] is a 75 y.o. very pleasant male patient with Body mass index is 33.22 kg/m. who presents with the following:  He is here for f/u on multiple chronic medical problems.  CAD with h/o MI He has always been very compliant.  Diabetes Mellitus: Tolerating Medications: yes Compliance with diet: fair, Body mass index is 33.22 kg/m. Exercise: minimal / intermittent Avg blood sugars at home: not checking Foot problems: none Hypoglycemia: none No nausea, vomitting, blurred vision, polyuria.  Lab Results  Component Value Date   HGBA1C 6.7 (A) 06/11/2021   HGBA1C 8.0 (H) 03/06/2021   HGBA1C 6.7 (H) 06/04/2020   Lab Results  Component Value Date   MICROALBUR 6.2 (H) 06/05/2020   LDLCALC 73 06/04/2020   CREATININE 1.23 03/06/2021    Wt Readings from Last 3 Encounters:  06/11/21 218 lb 8 oz (99.1 kg)  03/27/21 215 lb 3.2 oz (97.6 kg)  03/06/21 214 lb 8 oz (97.3 kg)    HTN: Tolerating all medications without side effects Stable and at goal No CP, no sob. No HA.  BP Readings from Last 3 Encounters:  06/11/21 128/64  03/27/21 (!) 144/72  03/06/21 768/11    Basic Metabolic Panel:    Component Value Date/Time   NA 137 03/06/2021 0856   NA 140 08/23/2014 0619   K 4.6 03/06/2021  0856   K 4.7 08/23/2014 0619   CL 103 03/06/2021 0856   CL 109 (H) 08/23/2014 0619   CO2 26 03/06/2021 0856   CO2 25 08/23/2014 0619   BUN 20 03/06/2021 0856   BUN 14 08/23/2014 0619   CREATININE 1.23 03/06/2021 0856   CREATININE 0.99 08/23/2014 0619   GLUCOSE 181 (H) 03/06/2021 0856   GLUCOSE 129 (H) 08/23/2014 0619   CALCIUM 9.4 03/06/2021 0856   CALCIUM 8.2 (L) 08/23/2014 0619    Lipids: Doing well, stable. Tolerating meds fine with no SE. Panel reviewed with patient.  Lipids: Lab Results  Component Value Date   CHOL 135 06/04/2020   Lab Results  Component Value Date   HDL 41.20 06/04/2020   Lab Results  Component Value Date   LDLCALC 73 06/04/2020   Lab Results  Component Value Date   TRIG 106.0 06/04/2020   Lab Results  Component Value Date   CHOLHDL 3 06/04/2020    Lab Results  Component Value Date   ALT 22 03/06/2021   AST 15 03/06/2021   ALKPHOS 93 03/06/2021   BILITOT 1.1 03/06/2021     Review of Systems is noted in the HPI, as appropriate  Objective:   BP 128/64   Pulse 76   Temp (!) 97.1 F (36.2 C) (Temporal)   Ht 5\' 8"  (1.727 m)   Wt 218 lb 8 oz (99.1 kg)   SpO2 94%   BMI  33.22 kg/m   GEN: No acute distress; alert,appropriate. PULM: Breathing comfortably in no respiratory distress PSYCH: Normally interactive.  CV: RRR, no m/g/r  PULM: Normal respiratory rate, no accessory muscle use. No wheezes, crackles or rhonchi   Laboratory and Imaging Data: Results for orders placed or performed in visit on 06/11/21  POCT glycosylated hemoglobin (Hb A1C)  Result Value Ref Range   Hemoglobin A1C 6.7 (A) 4.0 - 5.6 %   HbA1c POC (<> result, manual entry)     HbA1c, POC (prediabetic range)     HbA1c, POC (controlled diabetic range)       Assessment and Plan:     ICD-10-CM   1. DM (diabetes mellitus) type II, controlled, with peripheral vascular disorder (HCC)  E11.51 POCT glycosylated hemoglobin (Hb A1C)    2. Centrilobular emphysema  ([place])  J43.2     3. Hyperlipidemia LDL goal <70  E78.5      [name] is really doing well and better BS control.  F/u CPX in the fall   Orders Placed This Encounter  Procedures   POCT glycosylated hemoglobin (Hb A1C)      Signed,  Chaske Paskett T. Myldred Raju, MD   Outpatient Encounter Medications as of 06/11/2021  Medication Sig   albuterol (VENTOLIN HFA) 108 (90 Base) MCG/ACT inhaler Inhale 2 puffs into the lungs every 6 (six) hours as needed for wheezing or shortness of breath.   amLODipine (NORVASC) 10 MG tablet Take 1 tablet (10 mg total) by mouth daily.   aspirin 81 MG chewable tablet Chew 81 mg by mouth daily.   atorvastatin (LIPITOR) 40 MG tablet TAKE ONE TABLET BY MOUTH EVERY DAY   citalopram (CELEXA) 10 MG tablet TAKE ONE TABLET BY MOUTH EVERY DAY   Fluticasone-Salmeterol (ADVAIR DISKUS) 250-50 MCG/DOSE AEPB Inhale 1 puff into the lungs 2 (two) times daily.   metFORMIN (GLUCOPHAGE-XR) 500 MG 24 hr tablet Take 3 tablets (1,500 mg total) by mouth daily.   nitroGLYCERIN (NITROSTAT) 0.4 MG SL tablet Place 1 tablet (0.4 mg total) under the tongue every 5 (five) minutes as needed for chest pain.   No facility-administered encounter medications on file as of 06/11/2021.

## 2021-06-11 ENCOUNTER — Ambulatory Visit (INDEPENDENT_AMBULATORY_CARE_PROVIDER_SITE_OTHER): Payer: PPO | Admitting: Family Medicine

## 2021-06-11 ENCOUNTER — Encounter: Payer: Self-pay | Admitting: Family Medicine

## 2021-06-11 ENCOUNTER — Other Ambulatory Visit: Payer: Self-pay

## 2021-06-11 VITALS — BP 128/64 | HR 76 | Temp 97.1°F | Ht 68.0 in | Wt 218.5 lb

## 2021-06-11 DIAGNOSIS — J432 Centrilobular emphysema: Secondary | ICD-10-CM

## 2021-06-11 DIAGNOSIS — I25118 Atherosclerotic heart disease of native coronary artery with other forms of angina pectoris: Secondary | ICD-10-CM

## 2021-06-11 DIAGNOSIS — E785 Hyperlipidemia, unspecified: Secondary | ICD-10-CM | POA: Diagnosis not present

## 2021-06-11 DIAGNOSIS — E1151 Type 2 diabetes mellitus with diabetic peripheral angiopathy without gangrene: Secondary | ICD-10-CM | POA: Diagnosis not present

## 2021-06-11 LAB — POCT GLYCOSYLATED HEMOGLOBIN (HGB A1C): Hemoglobin A1C: 6.7 % — AB (ref 4.0–5.6)

## 2021-06-13 DIAGNOSIS — H2513 Age-related nuclear cataract, bilateral: Secondary | ICD-10-CM | POA: Diagnosis not present

## 2021-06-13 DIAGNOSIS — H401111 Primary open-angle glaucoma, right eye, mild stage: Secondary | ICD-10-CM | POA: Diagnosis not present

## 2021-06-13 LAB — HM DIABETES EYE EXAM

## 2021-06-18 ENCOUNTER — Encounter: Payer: Self-pay | Admitting: Family Medicine

## 2021-06-23 ENCOUNTER — Other Ambulatory Visit: Payer: Self-pay | Admitting: Family Medicine

## 2021-06-26 ENCOUNTER — Ambulatory Visit (INDEPENDENT_AMBULATORY_CARE_PROVIDER_SITE_OTHER): Payer: PPO

## 2021-06-26 ENCOUNTER — Encounter: Payer: Self-pay | Admitting: Family Medicine

## 2021-06-26 ENCOUNTER — Other Ambulatory Visit: Payer: Self-pay

## 2021-06-26 DIAGNOSIS — R0600 Dyspnea, unspecified: Secondary | ICD-10-CM | POA: Diagnosis not present

## 2021-06-26 DIAGNOSIS — J441 Chronic obstructive pulmonary disease with (acute) exacerbation: Secondary | ICD-10-CM

## 2021-06-26 DIAGNOSIS — R0609 Other forms of dyspnea: Secondary | ICD-10-CM

## 2021-07-18 DIAGNOSIS — H401122 Primary open-angle glaucoma, left eye, moderate stage: Secondary | ICD-10-CM | POA: Diagnosis not present

## 2021-07-18 DIAGNOSIS — H401111 Primary open-angle glaucoma, right eye, mild stage: Secondary | ICD-10-CM | POA: Diagnosis not present

## 2021-07-22 ENCOUNTER — Other Ambulatory Visit: Payer: Self-pay | Admitting: Family Medicine

## 2021-07-22 NOTE — Telephone Encounter (Signed)
Last office visit 06/11/2021 for DM.  Last refilled 07/16/2020 for #90 with 3 refills.  Last Lipid panel 06/04/2020. No future appointments. Ok to refill?

## 2021-09-01 ENCOUNTER — Other Ambulatory Visit: Payer: Self-pay | Admitting: Family Medicine

## 2021-09-03 NOTE — Telephone Encounter (Signed)
Please call patient and schedule appointment per last office note. F/u CPX in the fall

## 2021-09-03 NOTE — Telephone Encounter (Signed)
Lvm for pt to schedule a cpx and f/u also sent a mychart message

## 2021-09-04 NOTE — Telephone Encounter (Signed)
Reached out to pt but no answer

## 2021-09-11 ENCOUNTER — Other Ambulatory Visit: Payer: Self-pay | Admitting: Family Medicine

## 2021-09-24 ENCOUNTER — Encounter: Payer: Self-pay | Admitting: Family Medicine

## 2021-09-24 ENCOUNTER — Other Ambulatory Visit: Payer: Self-pay | Admitting: Family Medicine

## 2021-09-24 NOTE — Telephone Encounter (Signed)
Lvm for pt to call office to get scheduled for awv/lab also sent mychart letter

## 2021-09-24 NOTE — Telephone Encounter (Signed)
Please schedule Medicare Wellness with health educator and CPE with Dr. Lorelei Pont.

## 2021-09-25 NOTE — Telephone Encounter (Signed)
Called patient and left vm.

## 2021-10-16 ENCOUNTER — Encounter: Payer: PPO | Admitting: Family Medicine

## 2021-10-16 NOTE — Progress Notes (Signed)
This encounter was created in error - please disregard.

## 2021-10-17 ENCOUNTER — Encounter: Payer: Self-pay | Admitting: Family Medicine

## 2021-11-18 DIAGNOSIS — H401111 Primary open-angle glaucoma, right eye, mild stage: Secondary | ICD-10-CM | POA: Diagnosis not present

## 2021-12-18 ENCOUNTER — Other Ambulatory Visit: Payer: Self-pay | Admitting: Family Medicine

## 2021-12-18 NOTE — Telephone Encounter (Signed)
Please schedule CPE with fasting labs for Dr. Lorelei Rhodes.  Already has [place] with nurse scheduled for 01/07/2022.

## 2021-12-22 ENCOUNTER — Other Ambulatory Visit: Payer: Self-pay | Admitting: Family Medicine

## 2021-12-22 NOTE — Telephone Encounter (Signed)
Please schedule CPE with fasting labs prior with Dr. Lorelei Pont.  Already has [place] with nurse on 01/07/2022.  Please schedule after that date.

## 2021-12-22 NOTE — Telephone Encounter (Signed)
Called pt to schedule CPE, [email or web address]

## 2022-01-05 ENCOUNTER — Telehealth: Payer: Self-pay | Admitting: Family Medicine

## 2022-01-05 NOTE — Telephone Encounter (Signed)
Please schedule CPE with fasting labs with Dr. Lorelei Pont after 01/14/22.  He is already scheduled for nurse for [employer].

## 2022-01-05 NOTE — Telephone Encounter (Signed)
Pt scheduled cpe/lab in march 2023

## 2022-01-07 ENCOUNTER — Ambulatory Visit: Payer: PPO

## 2022-01-13 NOTE — Progress Notes (Signed)
Subjective:   [name] is a 76 y.o. male who presents for Medicare Annual/Subsequent preventive examination.  I connected with Kalil Snell today by telephone and verified that I am speaking with the correct person using two identifiers. Location patient: home Location provider: work Persons participating in the virtual visit: patient, Marine scientist.    I discussed the limitations, risks, security and privacy concerns of performing an evaluation and management service by telephone and the availability of in person appointments. I also discussed with the patient that there may be a patient responsible charge related to this service. The patient expressed understanding and verbally consented to this telephonic visit.    Interactive audio and video telecommunications were attempted between this provider and patient, however failed, due to patient having technical difficulties OR patient did not have access to video capability.  We continued and completed visit with audio only.  Some vital signs may be absent or patient reported.   Time Spent with patient on telephone encounter: 20 minutes  Review of Systems     Cardiac Risk Factors include: advanced age (>50men, >50 women);diabetes mellitus;hypertension;dyslipidemia     Objective:    Today's Vitals   01/14/22 0946  Weight: 215 lb (97.5 kg)  Height: 5\' 8"  (1.727 m)   Body mass index is 32.69 kg/m.  Advanced Directives 01/14/2022 02/22/2021 02/22/2021 02/22/2021 11/20/2020 06/04/2020 02/08/2019  Does Patient Have a Medical Advance Directive? Yes - Yes Yes Yes Yes Yes  Type of Paramedic of [place];Living will - Living will;Healthcare Power of [place];Living will [place];Living will [place];Living will  Does patient want to make changes to medical advance directive? Yes (MAU/Ambulatory/Procedural Areas - Information given)  - No - Patient declined - No - Patient declined - -  Copy of [place] in Chart? - No - copy requested - - No - copy requested No - copy requested No - copy requested    Current Medications (verified) Outpatient Encounter Medications as of 01/14/2022  Medication Sig   albuterol (VENTOLIN HFA) 108 (90 Base) MCG/ACT inhaler Inhale 2 puffs into the lungs every 6 (six) hours as needed for wheezing or shortness of breath.   amLODipine (NORVASC) 10 MG tablet TAKE 1 TABLET BY MOUTH DAILY   aspirin 81 MG chewable tablet Chew 81 mg by mouth daily.   atorvastatin (LIPITOR) 40 MG tablet TAKE ONE TABLET BY MOUTH EVERY DAY   citalopram (CELEXA) 10 MG tablet TAKE ONE TABLET BY MOUTH EVERY DAY   dorzolamide-timolol (COSOPT) 22.3-6.8 MG/ML ophthalmic solution 1 drop 2 (two) times daily.   fluticasone-salmeterol (ADVAIR) 250-50 MCG/ACT AEPB INHALE 1 PUFF TWICE DAILY   metFORMIN (GLUCOPHAGE-XR) 500 MG 24 hr tablet TAKE 3 TABLETS BY MOUTH DAILY   nitroGLYCERIN (NITROSTAT) 0.4 MG SL tablet Place 1 tablet (0.4 mg total) under the tongue every 5 (five) minutes as needed for chest pain.   No facility-administered encounter medications on file as of 01/14/2022.    Allergies (verified) Contrast media [iodinated contrast media], Xarelto [rivaroxaban], Clindamycin/lincomycin, Dye fdc red [red dye], Lovenox [enoxaparin sodium], and Penicillins   History: Past Medical History:  Diagnosis Date   Aortic transection 2005   Traumatic after a fall from a second floor. s/p repair at [facility]   Basal cell carcinoma    CAD (coronary artery disease) 1992   a. 1992 Acute anterior MI, thrombolytic therapy-->cath reportedly w/o significant CAD; b. 08/2016 MV: EF  57%, hypertensive respons, no ischemia/infarct; c. 04/2017 CTA chest w/ coronary Ca2+.   Depression    Diastolic dysfunction    a. 04/2017 Echo: EF 60-65%, Gr1 DD, nl RV fxn.   Erectile dysfunction    Fall 2005   fell off house: torn aorta, clavicle  fracture, rib fracture, vertebral fractures, lung contusion, coma x 2 weeks   Fracture of femoral neck, left ([place]) 08/09/2014   ORIF, Dr. Marry Guan   Fracture of radial neck, left, closed 08/09/2014   Gallstone pancreatitis    Gastric ulcer    History of ATN 08/09/2014   [facility], 2 days of dialysis (ARF)   Hyperlipidemia    Statin with joint pain    Hypertension    Obesity    Peripheral vascular disease ([place])    Atherosclerotic:R renal artery stenosis   Type II diabetes mellitus ([place])    Upper GI bleed    a. 04/2017 admit w/ melena and HGB down to 7 req prbc's; b. 04/2017 EGD: small HH, non-bleeding gastric & duod ulcers, non-bleeding erosive gastropathy-->PPI Rx.   Past Surgical History:  Procedure Laterality Date   CARDIAC CATHETERIZATION  08/1991   50 % mid-Lad stenosis with clot, 25-505 second marginal   CARDIOVASCULAR SURGERY     with ruptured Aorta, Dr. Camila Li, [place]     ESOPHAGOGASTRODUODENOSCOPY (EGD) WITH PROPOFOL N/A 05/03/2017   Procedure: ESOPHAGOGASTRODUODENOSCOPY (EGD) WITH PROPOFOL;  Surgeon: Lucilla Lame, MD;  Location[facility]COPY;  Service: Endoscopy;  Laterality: N/A;   KNEE SURGERY     Right    ORIF HIP FRACTURE Left 08/12/2014   Dr. Marry Guan   THORACOTOMY     thoracic aorta repair    TRACHEOSTOMY     s/p reversal   VIDEO BRONCHOSCOPY Bilateral 05/27/2017   Procedure: VIDEO BRONCHOSCOPY WITHOUT FLUORO;  Surgeon: Juanito Doom, MD;  Location: [facility] ENDOSCOPY;  Service: Cardiopulmonary;  Laterality: Bilateral;   Family History  Problem Relation Age of Onset   Dementia Mother 45   Heart attack Father 24   Diabetes Brother 32   Heart attack Brother    Diabetes Brother    Colon cancer Neg Hx    Stomach cancer Neg Hx    Esophageal cancer Neg Hx    Rectal cancer Neg Hx    Social History   Socioeconomic History   Marital status: Married    Spouse name: Not on file   Number of children: Not on file   Years of education: Not on file    Highest education level: Not on file  Occupational History   Occupation: Build in Production manager: self employed  Tobacco Use   Smoking status: Former    Packs/day: 1.50    Years: 20.00    Pack years: 30.00    Types: Cigarettes    Quit date: 09/13/1991    Years since quitting: 30.3   Smokeless tobacco: Never   Tobacco comments:    quit post MI  Vaping Use   Vaping Use: Never used  Substance and Sexual Activity   Alcohol use: Yes    Comment: 1-2 cansof beer per month   Drug use: No   Sexual activity: Not on file  Other Topics Concern   Not on file  Social History Narrative   No regular exercise    Social Determinants of Health   Financial Resource Strain: Low Risk    Difficulty of Paying Living Expenses: Not hard at all  Food Insecurity:  No Food Insecurity   Worried About Charity fundraiser in the Last Year: Never true   Ran Out of Food in the Last Year: Never true  Transportation Needs: No Transportation Needs   Lack of Transportation (Medical): No   Lack of Transportation (Non-Medical): No  Physical Activity: Insufficiently Active   Days of Exercise per Week: 3 days   Minutes of Exercise per Session: 30 min  Stress: No Stress Concern Present   Feeling of Stress : Not at all  Social Connections: Moderately Integrated   Frequency of Communication with Friends and Family: More than three times a week   Frequency of Social Gatherings with Friends and Family: More than three times a week   Attends Religious Services: More than 4 times per year   Active Member of [employer] or Organizations: No   Attends Music therapist: Never   Marital Status: Married    Tobacco Counseling Counseling given: Not Answered Tobacco comments: quit post MI   Clinical Intake:  Pre-visit preparation completed: Yes  Pain : No/denies pain     BMI - recorded: 32.69 Nutritional Status: BMI > 30  Obese Nutritional Risks: None Diabetes: Yes CBG done?: No Did pt.  bring in CBG monitor from home?: No  How often do you need to have someone help you when you read instructions, pamphlets, or other written materials from your doctor or pharmacy?: 1 - Never Diabetes:  Is the patient diabetic?  Yes  If diabetic, was a CBG obtained today?  No , patient states blood sugar was 120 last night  Did the patient bring in their glucometer from home?  No  How often do you monitor your CBG's? 2 times per week .   Financial Strains and Diabetes Management:  Are you having any financial strains with the device, your supplies or your medication? No .  Does the patient want to be seen by Chronic Care Management for management of their diabetes?  No  Would the patient like to be referred to a Nutritionist or for Diabetic Management?  No   Diabetic Exams:  Diabetic Eye Exam: Completed 06/13/21.  Diabetic Foot Exam: Pt has patient has an upcoming appointment with the PCP on 02/19/22  Interpreter Needed?: No  Information entered by :: Orrin Brigham LPN   Activities of Daily Living In your present state of health, do you have any difficulty performing the following activities: 01/14/2022 02/22/2021  Hearing? N N  Vision? N N  Difficulty concentrating or making decisions? N Y  Walking or climbing stairs? N Y  Dressing or bathing? N N  Doing errands, shopping? N N  Preparing Food and eating ? N -  Using the Toilet? N -  In the past six months, have you accidently leaked urine? N -  Do you have problems with loss of bowel control? N -  Managing your Medications? N -  Managing your Finances? N -  Housekeeping or managing your Housekeeping? N -  Some recent data might be hidden    Patient Care Team: [name], MD as PCP - General Sydnee Levans, MD as Consulting Physician (Dermatology)  Indicate any recent Medical Services you may have received from other than [facility] providers in the past year (date may be approximate).     Assessment:   This is a  routine wellness examination for [name].  Hearing/Vision screen Hearing Screening - Comments:: No issues  Vision Screening - Comments:: Last exam 06/13/21, [facility], Dr, Wallace Going  Dietary issues and exercise activities discussed: Current Exercise Habits: Home exercise routine, Type of exercise: walking, Time (Minutes): 30, Frequency (Times/Week): 3, Weekly Exercise (Minutes/Week): 90, Intensity: Mild   Goals Addressed             This Visit's Progress    Patient Stated       Would like to maintain current routine        Depression Screen PHQ 2/9 Scores 01/14/2022 06/04/2020 02/08/2019 02/01/2018 01/27/2017 11/18/2015 07/20/2013  PHQ - 2 Score 0 0 0 0 0 0 0  PHQ- 9 Score - 0 0 0 - - -    Fall Risk Fall Risk  01/14/2022 06/04/2020 02/08/2019 02/01/2018 01/27/2017  Falls in the past year? 0 0 0 No No  Number falls in past yr: 0 0 - - -  Injury with Fall? 0 0 - - -  Risk for fall due to : No Fall Risks Medication side effect - - -  Follow up Falls prevention discussed Falls evaluation completed;Falls prevention discussed - - -    FALL RISK PREVENTION PERTAINING TO THE HOME:  Any stairs in or around the home? Yes  If so, are there any without handrails? No  Home free of loose throw rugs in walkways, pet beds, electrical cords, etc? Yes  Adequate lighting in your home to reduce risk of falls? Yes   ASSISTIVE DEVICES UTILIZED TO PREVENT FALLS:  Life alert? No  Use of a cane, walker or w/c? No  Grab bars in the bathroom? Yes  Shower chair or bench in shower? No  Elevated toilet seat or a handicapped toilet? No   TIMED UP AND GO:  Was the test performed? No .    Cognitive Function: Normal cognitive status assessed by  this Nurse Health Advisor. No abnormalities found.   MMSE - Mini Mental State Exam 06/04/2020 02/08/2019 02/01/2018  Orientation to time 5 5 5   Orientation to Place 5 5 5   Registration 3 3 3   Attention/ Calculation 5 0 0  Recall 3 3 3   Language- name 2  objects - 0 0  Language- repeat 1 1 1   Language- follow 3 step command - 3 2  Language- follow 3 step command-comments - - unable to follow 1 step of 3 step command  Language- read & follow direction - 0 0  Write a sentence - 0 0  Copy design - 0 0  Total score - 20 19        Immunizations Immunization History  Administered Date(s) Administered   Fluad Quad(high Dose 65+) 09/14/2019, 11/27/2020   Influenza Split 10/04/2012   Influenza Whole 08/16/2008, 08/01/2014   Influenza, High Dose Seasonal PF 09/16/2017   Influenza,inj,Quad PF,6+ Mos 10/11/2013, 10/18/2015, 08/24/2016, 09/29/2018   PFIZER(Purple Top)SARS-COV-2 Vaccination 01/08/2020, 02/01/2020   Pneumococcal Conjugate-13 11/18/2015   Pneumococcal Polysaccharide-23 06/13/2012   Td 07/01/2015    TDAP status: Up to date  Flu Vaccine status: Due, Education has been provided regarding the importance of this vaccine. Advised may receive this vaccine at local pharmacy or Health Dept. Aware to provide a copy of the vaccination record if obtained from local pharmacy or Health Dept. Verbalized acceptance and understanding.  Pneumococcal vaccine status: Up to date  Covid-19 vaccine status: Information provided on how to obtain vaccines.   Qualifies for Shingles Vaccine? Yes   Zostavax completed No   Shingrix Completed?: No.    Education has been provided regarding the importance of this vaccine. Patient has been advised to  call insurance company to determine out of pocket expense if they have not yet received this vaccine. Advised may also receive vaccine at local pharmacy or Health Dept. Verbalized acceptance and understanding.  Screening Tests Health Maintenance  Topic Date Due   Zoster Vaccines- Shingrix (1 of 2) Never done   FOOT EXAM  07/21/2014   COVID-19 Vaccine (3 - Pfizer risk series) 02/29/2020   URINE MICROALBUMIN  06/05/2021   INFLUENZA VACCINE  06/30/2021   HEMOGLOBIN A1C  12/12/2021   OPHTHALMOLOGY EXAM   06/13/2022   COLONOSCOPY (Pts 45-17yrs Insurance coverage will need to be confirmed)  07/20/2022   TETANUS/TDAP  06/30/2025   Pneumonia Vaccine 31+ Years old  Completed   Hepatitis C Screening  Completed   HPV VACCINES  Aged Out    Health Maintenance  Health Maintenance Due  Topic Date Due   Zoster Vaccines- Shingrix (1 of 2) Never done   FOOT EXAM  07/21/2014   COVID-19 Vaccine (3 - Pfizer risk series) 02/29/2020   URINE MICROALBUMIN  06/05/2021   INFLUENZA VACCINE  06/30/2021   HEMOGLOBIN A1C  12/12/2021    Colorectal cancer screening: Type of screening: Colonoscopy. Completed 07/20/12. Repeat every 10 years  Lung Cancer Screening: (Low Dose CT Chest recommended if Age 85-80 years, 30 pack-year currently smoking OR have quit w/in 15years.) does qualify.     Additional Screening:  Hepatitis C Screening: does qualify; Completed 01/27/25  Vision Screening: Recommended annual ophthalmology exams for early detection of glaucoma and other disorders of the eye. Is the patient up to date with their annual eye exam?  Yes  Who is the provider or what is the name of the office in which the patient attends annual eye exams? Dr, Wallace Going    Dental Screening: Recommended annual dental exams for proper oral hygiene  Community Resource Referral / Chronic Care Management: CRR required this visit?  No   CCM required this visit?  No      Plan:     I have personally reviewed and noted the following in the patients chart:   Medical and social history Use of alcohol, tobacco or illicit drugs  Current medications and supplements including opioid prescriptions. Patient is not currently taking opioid prescriptions. Functional ability and status Nutritional status Physical activity Advanced directives List of other physicians Hospitalizations, surgeries, and ER visits in previous 12 months Vitals Screenings to include cognitive, depression, and falls Referrals and  appointments  In addition, I have reviewed and discussed with patient certain preventive protocols, quality metrics, and best practice recommendations. A written personalized care plan for preventive services as well as general preventive health recommendations were provided to patient.   Due to this being a telephonic visit, the after visit summary with patients personalized plan was offered to patient via mail or my-chart. Patient would like to access on my-chart.    Loma Messing, LPN   07/17/5630   Nurse Health Advisor  Nurse Notes: none

## 2022-01-14 ENCOUNTER — Ambulatory Visit (INDEPENDENT_AMBULATORY_CARE_PROVIDER_SITE_OTHER): Payer: PPO

## 2022-01-14 VITALS — Ht 68.0 in | Wt 215.0 lb

## 2022-01-14 DIAGNOSIS — Z0000 Encounter for general adult medical examination without abnormal findings: Secondary | ICD-10-CM | POA: Diagnosis not present

## 2022-01-14 NOTE — Patient Instructions (Signed)
[name] , Thank you for taking time to complete  your Medicare Wellness Visit. I appreciate your ongoing commitment to your health goals. Please review the following plan we discussed and let me know if I can assist you in the future.   Screening recommendations/referrals: Colonoscopy: up to date, completed 07/20/12, due 07/20/22 Recommended yearly ophthalmology/optometry visit for glaucoma screening and checkup Recommended yearly dental visit for hygiene and checkup  Vaccinations: Influenza vaccine: Due-May obtain vaccine at our office or your local pharmacy. Pneumococcal vaccine: up to date Tdap vaccine: up to date , completed 07/01/15, due 06/30/25 Shingles vaccine: Discuss with pharmacy   Covid-19: newest booster available at your local pharmacy   Advanced directives: Please bring a copy of Living Will and/or Healthcare Power of Attorney for your chart.   Conditions/risks identified: see problem list   Next appointment: Follow up in one year for your annual wellness visit. 01/15/23 @ 8:15am, this will be a telephone visit   Preventive Care 75 Years and Older, Male Preventive care refers to lifestyle choices and visits with your health care provider that can promote health and wellness. What does preventive care include? A yearly physical exam. This is also called an annual well check. Dental exams once or twice a year. Routine eye exams. Ask your health care provider how often you should have your eyes checked. Personal lifestyle choices, including: Daily care of your teeth and gums. Regular physical activity. Eating a healthy diet. Avoiding tobacco and drug use. Limiting alcohol use. Practicing safe sex. Taking low doses of aspirin every day. Taking vitamin and mineral supplements as recommended by your health care provider. What happens during an annual well check? The services and screenings done by your health care provider during your annual well check will depend on your age,  overall health, lifestyle risk factors, and family history of disease. Counseling  Your health care provider may ask you questions about your: Alcohol use. Tobacco use. Drug use. Emotional well-being. Home and relationship well-being. Sexual activity. Eating habits. History of falls. Memory and ability to understand (cognition). Work and work Statistician. Screening  You may have the following tests or measurements: Height, weight, and BMI. Blood pressure. Lipid and cholesterol levels. These may be checked every 5 years, or more frequently if you are over 41 years old. Skin check. Lung cancer screening. You may have this screening every year starting at age 12 if you have a 30-pack-year history of smoking and currently smoke or have quit within the past 15 years. Fecal occult blood test (FOBT) of the stool. You may have this test every year starting at age 60. Flexible sigmoidoscopy or colonoscopy. You may have a sigmoidoscopy every 5 years or a colonoscopy every 10 years starting at age 46. Prostate cancer screening. Recommendations will vary depending on your family history and other risks. Hepatitis C blood test. Hepatitis B blood test. Sexually transmitted disease (STD) testing. Diabetes screening. This is done by checking your blood sugar (glucose) after you have not eaten for a while (fasting). You may have this done every 1-3 years. Abdominal aortic aneurysm (AAA) screening. You may need this if you are a current or former smoker. Osteoporosis. You may be screened starting at age 94 if you are at high risk. Talk with your health care provider about your test results, treatment options, and if necessary, the need for more tests. Vaccines  Your health care provider may recommend certain vaccines, such as: Influenza vaccine. This is recommended every year. Tetanus,  diphtheria, and acellular pertussis (Tdap, Td) vaccine. You may need a Td booster every 10 years. Zoster vaccine.  You may need this after age 96. Pneumococcal 13-valent conjugate (PCV13) vaccine. One dose is recommended after age 9. Pneumococcal polysaccharide (PPSV23) vaccine. One dose is recommended after age 50. Talk to your health care provider about which screenings and vaccines you need and how often you need them. This information is not intended to replace advice given to you by your health care provider. Make sure you discuss any questions you have with your health care provider. Document Released: 12/13/2015 Document Revised: 08/05/2016 Document Reviewed: 09/17/2015 Elsevier Interactive Patient Education  2017 [place] Prevention in the Home Falls can cause injuries. They can happen to people of all ages. There are many things you can do to make your home safe and to help prevent falls. What can I do on the outside of my home? Regularly fix the edges of walkways and driveways and fix any cracks. Remove anything that might make you trip as you walk through a door, such as a raised step or threshold. Trim any bushes or trees on the path to your home. Use bright outdoor lighting. Clear any walking paths of anything that might make someone trip, such as rocks or tools. Regularly check to see if handrails are loose or broken. Make sure that both sides of any steps have handrails. Any raised decks and porches should have guardrails on the edges. Have any leaves, snow, or ice cleared regularly. Use sand or salt on walking paths during winter. Clean up any spills in your garage right away. This includes oil or grease spills. What can I do in the bathroom? Use night lights. Install grab bars by the toilet and in the tub and shower. Do not use towel bars as grab bars. Use non-skid mats or decals in the tub or shower. If you need to sit down in the shower, use a plastic, non-slip stool. Keep the floor dry. Clean up any water that spills on the floor as soon as it happens. Remove soap  buildup in the tub or shower regularly. Attach bath mats securely with double-sided non-slip rug tape. Do not have throw rugs and other things on the floor that can make you trip. What can I do in the bedroom? Use night lights. Make sure that you have a light by your bed that is easy to reach. Do not use any sheets or blankets that are too big for your bed. They should not hang down onto the floor. Have a firm chair that has side arms. You can use this for support while you get dressed. Do not have throw rugs and other things on the floor that can make you trip. What can I do in the kitchen? Clean up any spills right away. Avoid walking on wet floors. Keep items that you use a lot in easy-to-reach places. If you need to reach something above you, use a strong step stool that has a grab bar. Keep electrical cords out of the [employer]. Do not use floor polish or wax that makes floors slippery. If you must use wax, use non-skid floor wax. Do not have throw rugs and other things on the floor that can make you trip. What can I do with my stairs? Do not leave any items on the stairs. Make sure that there are handrails on both sides of the stairs and use them. Fix handrails that are broken or loose. Make  sure that handrails are as long as the stairways. Check any carpeting to make sure that it is firmly attached to the stairs. Fix any carpet that is loose or worn. Avoid having throw rugs at the top or bottom of the stairs. If you do have throw rugs, attach them to the floor with carpet tape. Make sure that you have a light switch at the top of the stairs and the bottom of the stairs. If you do not have them, ask someone to add them for you. What else can I do to help prevent falls? Wear shoes that: Do not have high heels. Have rubber bottoms. Are comfortable and fit you well. Are closed at the toe. Do not wear sandals. If you use a stepladder: Make sure that it is fully opened. Do not climb a closed  stepladder. Make sure that both sides of the stepladder are locked into place. Ask someone to hold it for you, if possible. Clearly mark and make sure that you can see: Any grab bars or handrails. First and last steps. Where the edge of each step is. Use tools that help you move around (mobility aids) if they are needed. These include: Canes. Walkers. Scooters. Crutches. Turn on the lights when you go into a dark area. Replace any light bulbs as soon as they burn out. Set up your furniture so you have a clear path. Avoid moving your furniture around. If any of your floors are uneven, fix them. If there are any pets around you, be aware of where they are. Review your medicines with your doctor. Some medicines can make you feel dizzy. This can increase your chance of falling. Ask your doctor what other things that you can do to help prevent falls. This information is not intended to replace advice given to you by your health care provider. Make sure you discuss any questions you have with your health care provider. Document Released: 09/12/2009 Document Revised: 04/23/2016 Document Reviewed: 12/21/2014 Elsevier Interactive Patient Education  2017 [employer].

## 2022-02-12 ENCOUNTER — Other Ambulatory Visit (INDEPENDENT_AMBULATORY_CARE_PROVIDER_SITE_OTHER): Payer: PPO

## 2022-02-12 ENCOUNTER — Other Ambulatory Visit: Payer: Self-pay

## 2022-02-12 DIAGNOSIS — Z125 Encounter for screening for malignant neoplasm of prostate: Secondary | ICD-10-CM

## 2022-02-12 DIAGNOSIS — E1151 Type 2 diabetes mellitus with diabetic peripheral angiopathy without gangrene: Secondary | ICD-10-CM

## 2022-02-12 DIAGNOSIS — Z79899 Other long term (current) drug therapy: Secondary | ICD-10-CM

## 2022-02-12 DIAGNOSIS — E785 Hyperlipidemia, unspecified: Secondary | ICD-10-CM | POA: Diagnosis not present

## 2022-02-12 LAB — PSA, MEDICARE: PSA: 1.93 ng/mL (ref 0.10–4.00)

## 2022-02-12 LAB — CBC WITH DIFFERENTIAL/PLATELET
Basophils Absolute: 0.1 10*3/uL (ref 0.0–0.1)
Basophils Relative: 1 % (ref 0.0–3.0)
Eosinophils Absolute: 0.1 10*3/uL (ref 0.0–0.7)
Eosinophils Relative: 2.3 % (ref 0.0–5.0)
HCT: 43.9 % (ref 39.0–52.0)
Hemoglobin: 15 g/dL (ref 13.0–17.0)
Lymphocytes Relative: 22.7 % (ref 12.0–46.0)
Lymphs Abs: 1.4 10*3/uL (ref 0.7–4.0)
MCHC: 34.2 g/dL (ref 30.0–36.0)
MCV: 87.1 fl (ref 78.0–100.0)
Monocytes Absolute: 0.5 10*3/uL (ref 0.1–1.0)
Monocytes Relative: 8 % (ref 3.0–12.0)
Neutro Abs: 4 10*3/uL (ref 1.4–7.7)
Neutrophils Relative %: 66 % (ref 43.0–77.0)
Platelets: 191 10*3/uL (ref 150.0–400.0)
RBC: 5.05 Mil/uL (ref 4.22–5.81)
RDW: 13.6 % (ref 11.5–15.5)
WBC: 6 10*3/uL (ref 4.0–10.5)

## 2022-02-12 LAB — LIPID PANEL
Cholesterol: 114 mg/dL (ref 0–200)
HDL: 44.2 mg/dL
LDL Cholesterol: 59 mg/dL (ref 0–99)
NonHDL: 70.21
Total CHOL/HDL Ratio: 3
Triglycerides: 58 mg/dL (ref 0.0–149.0)
VLDL: 11.6 mg/dL (ref 0.0–40.0)

## 2022-02-12 LAB — BASIC METABOLIC PANEL WITH GFR
BUN: 20 mg/dL (ref 6–23)
CO2: 26 meq/L (ref 19–32)
Calcium: 9.6 mg/dL (ref 8.4–10.5)
Chloride: 108 meq/L (ref 96–112)
Creatinine, Ser: 0.97 mg/dL (ref 0.40–1.50)
GFR: 76.79 mL/min
Glucose, Bld: 123 mg/dL — ABNORMAL HIGH (ref 70–99)
Potassium: 5.2 meq/L — ABNORMAL HIGH (ref 3.5–5.1)
Sodium: 141 meq/L (ref 135–145)

## 2022-02-12 LAB — HEPATIC FUNCTION PANEL
ALT: 16 U/L (ref 0–53)
AST: 14 U/L (ref 0–37)
Albumin: 5 g/dL (ref 3.5–5.2)
Alkaline Phosphatase: 85 U/L (ref 39–117)
Bilirubin, Direct: 0.2 mg/dL (ref 0.0–0.3)
Total Bilirubin: 1.1 mg/dL (ref 0.2–1.2)
Total Protein: 7.2 g/dL (ref 6.0–8.3)

## 2022-02-12 LAB — HEMOGLOBIN A1C: Hgb A1c MFr Bld: 6.2 % (ref 4.6–6.5)

## 2022-02-19 ENCOUNTER — Encounter: Payer: Self-pay | Admitting: Family Medicine

## 2022-02-19 ENCOUNTER — Other Ambulatory Visit: Payer: Self-pay

## 2022-02-19 ENCOUNTER — Ambulatory Visit (INDEPENDENT_AMBULATORY_CARE_PROVIDER_SITE_OTHER): Payer: PPO | Admitting: Family Medicine

## 2022-02-19 VITALS — BP 140/62 | HR 67 | Temp 98.4°F | Ht 68.0 in | Wt 218.5 lb

## 2022-02-19 DIAGNOSIS — L570 Actinic keratosis: Secondary | ICD-10-CM | POA: Diagnosis not present

## 2022-02-19 DIAGNOSIS — Z0000 Encounter for general adult medical examination without abnormal findings: Secondary | ICD-10-CM | POA: Diagnosis not present

## 2022-02-19 DIAGNOSIS — L989 Disorder of the skin and subcutaneous tissue, unspecified: Secondary | ICD-10-CM | POA: Diagnosis not present

## 2022-02-19 NOTE — Patient Instructions (Signed)
Shingrix vaccine -  ? ?Bivalent Covid ?
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
## 2022-03-30 ENCOUNTER — Other Ambulatory Visit: Payer: Self-pay | Admitting: Family Medicine

## 2022-04-17 ENCOUNTER — Telehealth: Payer: Self-pay | Admitting: Cardiovascular Disease

## 2022-04-17 NOTE — Telephone Encounter (Signed)
3 attempts to schedule fu appt from recall list.   Deleting recall.   

## 2022-04-20 ENCOUNTER — Other Ambulatory Visit: Payer: Self-pay | Admitting: Family Medicine

## 2022-05-19 DIAGNOSIS — H401123 Primary open-angle glaucoma, left eye, severe stage: Secondary | ICD-10-CM | POA: Diagnosis not present

## 2022-05-19 LAB — HM DIABETES EYE EXAM

## 2022-05-21 ENCOUNTER — Other Ambulatory Visit: Payer: Self-pay | Admitting: Family Medicine

## 2022-06-24 ENCOUNTER — Encounter: Payer: Self-pay | Admitting: Family Medicine

## 2022-06-29 ENCOUNTER — Other Ambulatory Visit: Payer: Self-pay | Admitting: Family Medicine

## 2022-06-29 DIAGNOSIS — H401123 Primary open-angle glaucoma, left eye, severe stage: Secondary | ICD-10-CM | POA: Diagnosis not present

## 2022-08-05 ENCOUNTER — Emergency Department: Payer: PPO

## 2022-08-05 ENCOUNTER — Inpatient Hospital Stay
Admission: EM | Admit: 2022-08-05 | Discharge: 2022-08-07 | DRG: 177 | Disposition: A | Discharge status: Home / Self Care | Payer: PPO | Attending: Internal Medicine | Admitting: Internal Medicine

## 2022-08-05 ENCOUNTER — Other Ambulatory Visit: Payer: Self-pay

## 2022-08-05 ENCOUNTER — Encounter: Payer: Self-pay | Admitting: Emergency Medicine

## 2022-08-05 DIAGNOSIS — K219 Gastro-esophageal reflux disease without esophagitis: Secondary | ICD-10-CM | POA: Diagnosis present

## 2022-08-05 DIAGNOSIS — K449 Diaphragmatic hernia without obstruction or gangrene: Secondary | ICD-10-CM | POA: Diagnosis present

## 2022-08-05 DIAGNOSIS — A419 Sepsis, unspecified organism: Secondary | ICD-10-CM | POA: Diagnosis present

## 2022-08-05 DIAGNOSIS — Z6832 Body mass index (BMI) 32.0-32.9, adult: Secondary | ICD-10-CM

## 2022-08-05 DIAGNOSIS — J188 Other pneumonia, unspecified organism: Secondary | ICD-10-CM | POA: Diagnosis present

## 2022-08-05 DIAGNOSIS — I5032 Chronic diastolic (congestive) heart failure: Secondary | ICD-10-CM | POA: Diagnosis present

## 2022-08-05 DIAGNOSIS — J449 Chronic obstructive pulmonary disease, unspecified: Secondary | ICD-10-CM | POA: Diagnosis present

## 2022-08-05 DIAGNOSIS — I2510 Atherosclerotic heart disease of native coronary artery without angina pectoris: Secondary | ICD-10-CM | POA: Diagnosis present

## 2022-08-05 DIAGNOSIS — Z8249 Family history of ischemic heart disease and other diseases of the circulatory system: Secondary | ICD-10-CM

## 2022-08-05 DIAGNOSIS — Z7984 Long term (current) use of oral hypoglycemic drugs: Secondary | ICD-10-CM

## 2022-08-05 DIAGNOSIS — E785 Hyperlipidemia, unspecified: Secondary | ICD-10-CM | POA: Diagnosis present

## 2022-08-05 DIAGNOSIS — J441 Chronic obstructive pulmonary disease with (acute) exacerbation: Secondary | ICD-10-CM | POA: Diagnosis not present

## 2022-08-05 DIAGNOSIS — Z7982 Long term (current) use of aspirin: Secondary | ICD-10-CM

## 2022-08-05 DIAGNOSIS — J690 Pneumonitis due to inhalation of food and vomit: Principal | ICD-10-CM | POA: Diagnosis present

## 2022-08-05 DIAGNOSIS — I10 Essential (primary) hypertension: Secondary | ICD-10-CM | POA: Diagnosis present

## 2022-08-05 DIAGNOSIS — Z20822 Contact with and (suspected) exposure to covid-19: Secondary | ICD-10-CM | POA: Diagnosis present

## 2022-08-05 DIAGNOSIS — Z881 Allergy status to other antibiotic agents status: Secondary | ICD-10-CM

## 2022-08-05 DIAGNOSIS — Z7951 Long term (current) use of inhaled steroids: Secondary | ICD-10-CM

## 2022-08-05 DIAGNOSIS — F3340 Major depressive disorder, recurrent, in remission, unspecified: Secondary | ICD-10-CM | POA: Diagnosis present

## 2022-08-05 DIAGNOSIS — I110 Hypertensive heart disease with heart failure: Secondary | ICD-10-CM | POA: Diagnosis present

## 2022-08-05 DIAGNOSIS — Z87891 Personal history of nicotine dependence: Secondary | ICD-10-CM | POA: Diagnosis not present

## 2022-08-05 DIAGNOSIS — Z6830 Body mass index (BMI) 30.0-30.9, adult: Secondary | ICD-10-CM | POA: Diagnosis not present

## 2022-08-05 DIAGNOSIS — J189 Pneumonia, unspecified organism: Secondary | ICD-10-CM | POA: Diagnosis present

## 2022-08-05 DIAGNOSIS — G4733 Obstructive sleep apnea (adult) (pediatric): Secondary | ICD-10-CM | POA: Diagnosis present

## 2022-08-05 DIAGNOSIS — E1151 Type 2 diabetes mellitus with diabetic peripheral angiopathy without gangrene: Secondary | ICD-10-CM | POA: Diagnosis present

## 2022-08-05 DIAGNOSIS — Z79899 Other long term (current) drug therapy: Secondary | ICD-10-CM | POA: Diagnosis not present

## 2022-08-05 DIAGNOSIS — Z8719 Personal history of other diseases of the digestive system: Secondary | ICD-10-CM

## 2022-08-05 DIAGNOSIS — Z888 Allergy status to other drugs, medicaments and biological substances status: Secondary | ICD-10-CM

## 2022-08-05 DIAGNOSIS — E669 Obesity, unspecified: Secondary | ICD-10-CM | POA: Diagnosis present

## 2022-08-05 DIAGNOSIS — Z85828 Personal history of other malignant neoplasm of skin: Secondary | ICD-10-CM | POA: Diagnosis not present

## 2022-08-05 DIAGNOSIS — I252 Old myocardial infarction: Secondary | ICD-10-CM | POA: Diagnosis not present

## 2022-08-05 DIAGNOSIS — Z91199 Patient's noncompliance with other medical treatment and regimen due to unspecified reason: Secondary | ICD-10-CM | POA: Diagnosis not present

## 2022-08-05 DIAGNOSIS — J9601 Acute respiratory failure with hypoxia: Secondary | ICD-10-CM | POA: Diagnosis present

## 2022-08-05 DIAGNOSIS — R911 Solitary pulmonary nodule: Secondary | ICD-10-CM | POA: Diagnosis not present

## 2022-08-05 DIAGNOSIS — Z8711 Personal history of peptic ulcer disease: Secondary | ICD-10-CM

## 2022-08-05 DIAGNOSIS — Z833 Family history of diabetes mellitus: Secondary | ICD-10-CM

## 2022-08-05 DIAGNOSIS — Z9102 Food additives allergy status: Secondary | ICD-10-CM

## 2022-08-05 DIAGNOSIS — J439 Emphysema, unspecified: Secondary | ICD-10-CM | POA: Diagnosis not present

## 2022-08-05 DIAGNOSIS — Z880 Allergy status to penicillin: Secondary | ICD-10-CM

## 2022-08-05 DIAGNOSIS — J168 Pneumonia due to other specified infectious organisms: Secondary | ICD-10-CM | POA: Diagnosis not present

## 2022-08-05 DIAGNOSIS — R0602 Shortness of breath: Secondary | ICD-10-CM | POA: Diagnosis not present

## 2022-08-05 LAB — RESPIRATORY PANEL BY PCR

## 2022-08-05 LAB — CBC WITH DIFFERENTIAL/PLATELET
Abs Immature Granulocytes: 0.05 10*3/uL (ref 0.00–0.07)
Basophils Absolute: 0.1 10*3/uL (ref 0.0–0.1)
Basophils Relative: 0 %
Eosinophils Absolute: 0 10*3/uL (ref 0.0–0.5)
Eosinophils Relative: 0 %
HCT: 49.3 % (ref 39.0–52.0)
Hemoglobin: 17 g/dL (ref 13.0–17.0)
Immature Granulocytes: 0 %
Lymphocytes Relative: 4 %
Lymphs Abs: 0.5 10*3/uL — ABNORMAL LOW (ref 0.7–4.0)
MCH: 28.7 pg (ref 26.0–34.0)
MCHC: 34.5 g/dL (ref 30.0–36.0)
MCV: 83.3 fL (ref 80.0–100.0)
Monocytes Absolute: 0.2 10*3/uL (ref 0.1–1.0)
Monocytes Relative: 2 %
Neutro Abs: 12.5 10*3/uL — ABNORMAL HIGH (ref 1.7–7.7)
Neutrophils Relative %: 94 %
Platelets: 162 10*3/uL (ref 150–400)
RBC: 5.92 MIL/uL — ABNORMAL HIGH (ref 4.22–5.81)
RDW: 12.9 % (ref 11.5–15.5)
WBC: 12.5 10*3/uL — ABNORMAL HIGH (ref 4.0–10.5)
nRBC: 0 % (ref 0.0–0.2)

## 2022-08-05 LAB — COMPREHENSIVE METABOLIC PANEL WITH GFR
ALT: 15 U/L (ref 0–44)
AST: 17 U/L (ref 15–41)
Albumin: 4.7 g/dL (ref 3.5–5.0)
Alkaline Phosphatase: 126 U/L (ref 38–126)
Anion gap: 9 (ref 5–15)
BUN: 15 mg/dL (ref 8–23)
CO2: 20 mmol/L — ABNORMAL LOW (ref 22–32)
Calcium: 9.4 mg/dL (ref 8.9–10.3)
Chloride: 111 mmol/L (ref 98–111)
Creatinine, Ser: 1.01 mg/dL (ref 0.61–1.24)
GFR, Estimated: >60 mL/min
Glucose, Bld: 161 mg/dL — ABNORMAL HIGH (ref 70–99)
Potassium: 4.3 mmol/L (ref 3.5–5.1)
Sodium: 140 mmol/L (ref 135–145)
Total Bilirubin: 1.5 mg/dL — ABNORMAL HIGH (ref 0.3–1.2)
Total Protein: 8 g/dL (ref 6.5–8.1)

## 2022-08-05 LAB — TROPONIN I (HIGH SENSITIVITY)
Troponin I (High Sensitivity): 8 ng/L
Troponin I (High Sensitivity): 8 ng/L

## 2022-08-05 LAB — GLUCOSE, CAPILLARY
Glucose-Capillary: 235 mg/dL — ABNORMAL HIGH (ref 70–99)
Glucose-Capillary: 255 mg/dL — ABNORMAL HIGH (ref 70–99)
Glucose-Capillary: 276 mg/dL — ABNORMAL HIGH (ref 70–99)

## 2022-08-05 LAB — CBG MONITORING, ED: Glucose-Capillary: 163 mg/dL — ABNORMAL HIGH (ref 70–99)

## 2022-08-05 LAB — BRAIN NATRIURETIC PEPTIDE: B Natriuretic Peptide: 78.2 pg/mL (ref 0.0–100.0)

## 2022-08-05 LAB — STREP PNEUMONIAE URINARY ANTIGEN: Strep Pneumo Urinary Antigen: NEGATIVE

## 2022-08-05 LAB — LACTIC ACID, PLASMA: Lactic Acid, Venous: 1 mmol/L (ref 0.5–1.9)

## 2022-08-05 LAB — SARS CORONAVIRUS 2 BY RT PCR: SARS Coronavirus 2 by RT PCR: NEGATIVE

## 2022-08-05 LAB — PROCALCITONIN: Procalcitonin: <0.1 ng/mL

## 2022-08-05 MED ORDER — HYDRALAZINE HCL 20 MG/ML IJ SOLN: 5.0000 mg | INTRAMUSCULAR | Status: DC | PRN

## 2022-08-05 MED ORDER — ONDANSETRON HCL 4 MG/2ML IJ SOLN: 4.0000 mg | Freq: Three times a day (TID) | INTRAMUSCULAR | Status: DC | PRN

## 2022-08-05 MED ORDER — LEVOFLOXACIN IN D5W 750 MG/150ML IV SOLN
750.0000 mg | INTRAVENOUS | Status: DC
  Administered 2022-08-05 – 2022-08-07 (×3): 750 mg via INTRAVENOUS
  Filled 2022-08-05 (×3): qty 150

## 2022-08-05 MED ORDER — CITALOPRAM HYDROBROMIDE 20 MG PO TABS
10.0000 mg | ORAL_TABLET | Freq: Every day | ORAL | Status: DC
  Administered 2022-08-05 – 2022-08-07 (×3): 10 mg via ORAL
  Filled 2022-08-05 (×3): qty 1

## 2022-08-05 MED ORDER — LATANOPROST 0.005 % OP SOLN
1.0000 [drp] | Freq: Every day | OPHTHALMIC | Status: DC
  Administered 2022-08-06: 1 [drp] via OPHTHALMIC
  Filled 2022-08-05: qty 2.5

## 2022-08-05 MED ORDER — DORZOLAMIDE HCL-TIMOLOL MAL 2-0.5 % OP SOLN
1.0000 [drp] | Freq: Two times a day (BID) | OPHTHALMIC | Status: DC
  Administered 2022-08-05 – 2022-08-07 (×4): 1 [drp] via OPHTHALMIC
  Filled 2022-08-05: qty 10

## 2022-08-05 MED ORDER — IPRATROPIUM-ALBUTEROL 0.5-2.5 (3) MG/3ML IN SOLN
3.0000 mL | Freq: Once | RESPIRATORY_TRACT | Status: AC
  Administered 2022-08-05: 3 mL via RESPIRATORY_TRACT
  Filled 2022-08-05: qty 3

## 2022-08-05 MED ORDER — DM-GUAIFENESIN ER 30-600 MG PO TB12: 1.0000 | ORAL_TABLET | Freq: Two times a day (BID) | ORAL | Status: DC | PRN

## 2022-08-05 MED ORDER — INSULIN ASPART 100 UNIT/ML IJ SOLN
0.0000 [IU] | Freq: Three times a day (TID) | INTRAMUSCULAR | Status: DC
  Administered 2022-08-05: 2 [IU] via SUBCUTANEOUS
  Administered 2022-08-05: 3 [IU] via SUBCUTANEOUS
  Administered 2022-08-06: 2 [IU] via SUBCUTANEOUS
  Administered 2022-08-06 (×2): 3 [IU] via SUBCUTANEOUS
  Administered 2022-08-07 (×2): 2 [IU] via SUBCUTANEOUS
  Filled 2022-08-05 (×8): qty 1

## 2022-08-05 MED ORDER — ACETAMINOPHEN 325 MG PO TABS: 650.0000 mg | ORAL_TABLET | Freq: Four times a day (QID) | ORAL | Status: DC | PRN

## 2022-08-05 MED ORDER — ATORVASTATIN CALCIUM 20 MG PO TABS
40.0000 mg | ORAL_TABLET | Freq: Every day | ORAL | Status: DC
  Administered 2022-08-05 – 2022-08-06 (×2): 40 mg via ORAL
  Filled 2022-08-05 (×2): qty 2

## 2022-08-05 MED ORDER — AMLODIPINE BESYLATE 10 MG PO TABS
10.0000 mg | ORAL_TABLET | Freq: Every day | ORAL | Status: DC
  Administered 2022-08-05 – 2022-08-07 (×3): 10 mg via ORAL
  Filled 2022-08-05: qty 2
  Filled 2022-08-05 (×2): qty 1

## 2022-08-05 MED ORDER — INSULIN ASPART 100 UNIT/ML IJ SOLN
0.0000 [IU] | Freq: Every day | INTRAMUSCULAR | Status: DC
  Administered 2022-08-05: 3 [IU] via SUBCUTANEOUS
  Filled 2022-08-05: qty 1

## 2022-08-05 MED ORDER — MOMETASONE FURO-FORMOTEROL FUM 200-5 MCG/ACT IN AERO
2.0000 | INHALATION_SPRAY | Freq: Two times a day (BID) | RESPIRATORY_TRACT | Status: DC
  Administered 2022-08-06 – 2022-08-07 (×3): 2 via RESPIRATORY_TRACT
  Filled 2022-08-05: qty 8.8

## 2022-08-05 MED ORDER — IPRATROPIUM-ALBUTEROL 0.5-2.5 (3) MG/3ML IN SOLN
3.0000 mL | RESPIRATORY_TRACT | Status: DC
  Administered 2022-08-05 (×2): 3 mL via RESPIRATORY_TRACT
  Filled 2022-08-05 (×2): qty 3

## 2022-08-05 MED ORDER — METRONIDAZOLE 500 MG/100ML IV SOLN
500.0000 mg | Freq: Two times a day (BID) | INTRAVENOUS | Status: DC
  Administered 2022-08-05 – 2022-08-07 (×4): 500 mg via INTRAVENOUS
  Filled 2022-08-05 (×5): qty 100

## 2022-08-05 MED ORDER — NITROGLYCERIN 0.4 MG SL SUBL: 0.4000 mg | SUBLINGUAL_TABLET | SUBLINGUAL | Status: DC | PRN

## 2022-08-05 MED ORDER — IPRATROPIUM-ALBUTEROL 0.5-2.5 (3) MG/3ML IN SOLN
3.0000 mL | Freq: Two times a day (BID) | RESPIRATORY_TRACT | Status: DC
  Administered 2022-08-06 – 2022-08-07 (×3): 3 mL via RESPIRATORY_TRACT
  Filled 2022-08-05 (×3): qty 3

## 2022-08-05 MED ORDER — ALBUTEROL SULFATE (2.5 MG/3ML) 0.083% IN NEBU: 2.5000 mg | INHALATION_SOLUTION | RESPIRATORY_TRACT | Status: DC | PRN

## 2022-08-05 MED ORDER — ASPIRIN 81 MG PO CHEW
81.0000 mg | CHEWABLE_TABLET | Freq: Every day | ORAL | Status: DC
  Administered 2022-08-05 – 2022-08-07 (×3): 81 mg via ORAL
  Filled 2022-08-05 (×3): qty 1

## 2022-08-05 MED ORDER — METHYLPREDNISOLONE SODIUM SUCC 125 MG IJ SOLR
125.0000 mg | Freq: Once | INTRAMUSCULAR | Status: AC
  Administered 2022-08-05: 125 mg via INTRAVENOUS
  Filled 2022-08-05: qty 2

## 2022-08-05 NOTE — Assessment & Plan Note (Signed)
-  see above 

## 2022-08-05 NOTE — Assessment & Plan Note (Signed)
2d echo on 05/14/17 showed EF 60-65% with grade I diastolic dysFx. No leg edema. BNP 78.2. CHF is compensated. -watch volume status closely.

## 2022-08-05 NOTE — Assessment & Plan Note (Signed)
Trop 8 --> 8. -ASA, Lipitor

## 2022-08-05 NOTE — Assessment & Plan Note (Signed)
lipitor

## 2022-08-05 NOTE — ED Notes (Signed)
Pt. Given specimen cup to collect sputum if he can cough any up.

## 2022-08-05 NOTE — Assessment & Plan Note (Signed)
No wheezing and rhonchi. -bronchodilators

## 2022-08-05 NOTE — H&P (Signed)
History and Physical    [name] DZH:[id number] DOB: 1946/11/28 DOA: 08/05/2022  Referring MD/NP/PA:   PCP: Pcp, No   Patient coming from:  The patient is coming from home.  At baseline, pt is independent for most of ADL.        Chief Complaint: SOB and cough  HPI: [name] is a 76 y.o. male with medical history significant of hypertension, hyperlipidemia, diabetes mellitus, COPD, depression, GI bleeding, dCHF, CAD, aortic transection (s/p for repair), OSA not using CPAP currently, PVD, who presents with shortness breath and cough.  Patient states his symptoms started yesterday, including cough and shortness breath.  His has been progressively worsening.  Patient has productive cough with yellow-colored sputum production.  No fever or chills.  Patient had some mild frontal chest pain initially, which has subsided.  No nausea vomiting, diarrhea or abdominal pain.  No symptoms of UTI.  Patient is not using oxygen normally at home.  Patient was found to have 1 episode of oxygen desaturation to 86% on room air, which improved to 92-94% on room air later.  No acute respiratory distress.  Data reviewed independently and ED Course: pt was found to have WBC 12.5, BMP 78.2, troponin level 8, 8, negative COVID PCR, GFR> 60, temperature normal, blood pressure 146/78, heart rate 90, 68, RR 24, chest x-ray showed right middle lobe infiltration.  Patient is admitted to telemetry bed as inpatient.   EKG: I have personally reviewed.  Sinus rhythm, QTc 418, LAE, nonspecific T wave change.  CT-chest without contrast: 1. Patchy areas of nodularity with surrounding ground-glass opacity in the RIGHT lower lobe and RIGHT middle lobe in particular are superimposed on background changes of pulmonary emphysema. Findings are most suggestive of multifocal pneumonia, perhaps related to aspiration. Discrete nodularity is present in addition to tree-in-bud opacities. Would include atypical infection  including nontuberculous in tuberculous mycobacterial infection in the differential. Would also suggest blood cultures to exclude the possibility of an atypical presentation of septic emboli and include follow-up as outlined previously to ensure resolution of the above process. 2. Subcarinal nodal enlargement new from previous imaging, perhaps reactive but would suggest attention on follow-up. 3. Ultimately pulmonary consultation may be helpful for further evaluation given persistence and or recurrence when compared to imaging from March. 4. Stool like material in the esophagus. Swallow evaluation and or dedicated esophageal evaluation may be helpful. More focal area in the distal esophagus does not allow for exclusion of distal esophageal neoplasm or stricture. 5. Three-vessel coronary artery disease. 6. Small hiatal hernia.    Review of Systems:   General: no fevers, chills, no body weight gain, has fatigue HEENT: no blurry vision, hearing changes or sore throat Respiratory: has dyspnea, coughing, no wheezing CV: had chest pain, no palpitations GI: no nausea, vomiting, abdominal pain, diarrhea, constipation GU: no dysuria, burning on urination, increased urinary frequency, hematuria  Ext: no leg edema Neuro: no unilateral weakness, numbness, or tingling, no vision change or hearing loss Skin: no rash, no skin tear. MSK: No muscle spasm, no deformity, no limitation of range of movement in spin Heme: No easy bruising.  Travel history: No recent long distant travel.   Allergy:  Allergies  Allergen Reactions   Contrast Media [Iodinated Contrast Media] Hives   Penicillins Swelling and Other (See Comments)    08/05/22 Per MD: pt had severe rash and facial swelling with PCN     Xarelto [Rivaroxaban] Other (See Comments)    GI Bleed  Clindamycin/Lincomycin Rash   Dye Fdc Red [Red Dye] Rash   Lovenox [Enoxaparin Sodium] Rash    Past Medical History:  Diagnosis Date    Aortic transection 2005   Traumatic after a fall from a second floor. s/p repair at [facility]   Basal cell carcinoma    CAD (coronary artery disease) 1992   a. 1992 Acute anterior MI, thrombolytic therapy-->cath reportedly w/o significant CAD; b. 08/2016 MV: EF 57%, hypertensive respons, no ischemia/infarct; c. 04/2017 CTA chest w/ coronary Ca2+.   Depression    Diastolic dysfunction    a. 04/2017 Echo: EF 60-65%, Gr1 DD, nl RV fxn.   Erectile dysfunction    Fall 2005   fell off house: torn aorta, clavicle fracture, rib fracture, vertebral fractures, lung contusion, coma x 2 weeks   Fracture of femoral neck, left ([place]) 08/09/2014   ORIF, Dr. Marry Guan   Fracture of radial neck, left, closed 08/09/2014   Gallstone pancreatitis    Gastric ulcer    History of ATN 08/09/2014   [facility], 2 days of dialysis (ARF)   Hyperlipidemia    Statin with joint pain    Hypertension    Obesity    Peripheral vascular disease ([place])    Atherosclerotic:R renal artery stenosis   Type II diabetes mellitus ([place])    Upper GI bleed    a. 04/2017 admit w/ melena and HGB down to 7 req prbc's; b. 04/2017 EGD: small HH, non-bleeding gastric & duod ulcers, non-bleeding erosive gastropathy-->PPI Rx.    Past Surgical History:  Procedure Laterality Date   CARDIAC CATHETERIZATION  08/1991   50 % mid-Lad stenosis with clot, 25-505 second marginal   CARDIOVASCULAR SURGERY     with ruptured Aorta, Dr. Camila Li, [place]     ESOPHAGOGASTRODUODENOSCOPY (EGD) WITH PROPOFOL N/A 05/03/2017   Procedure: ESOPHAGOGASTRODUODENOSCOPY (EGD) WITH PROPOFOL;  Surgeon: Lucilla Lame, MD;  Location: [facility] ENDOSCOPY;  Service: Endoscopy;  Laterality: N/A;   KNEE SURGERY     Right    ORIF HIP FRACTURE Left 08/12/2014   Dr. Marry Guan   THORACOTOMY     thoracic aorta repair    TRACHEOSTOMY     s/p reversal   VIDEO BRONCHOSCOPY Bilateral 05/27/2017   Procedure: VIDEO BRONCHOSCOPY WITHOUT FLUORO;  Surgeon: Juanito Doom, MD;  Location:  [facility] ENDOSCOPY;  Service: Cardiopulmonary;  Laterality: Bilateral;    Social History:  reports that he quit smoking about 30 years ago. His smoking use included cigarettes. He has a 30.00 pack-year smoking history. He has never used smokeless tobacco. He reports current alcohol use. He reports that he does not use drugs.  Family History:  Family History  Problem Relation Age of Onset   Dementia Mother 51   Heart attack Father 33   Diabetes Brother 25   Heart attack Brother    Diabetes Brother    Colon cancer Neg Hx    Stomach cancer Neg Hx    Esophageal cancer Neg Hx    Rectal cancer Neg Hx      Prior to Admission medications   Medication Sig Start Date End Date Taking? Authorizing Provider  albuterol (VENTOLIN HFA) 108 (90 Base) MCG/ACT inhaler Inhale 2 puffs into the lungs every 6 (six) hours as needed for wheezing or shortness of breath. 03/06/21 03/06/22  Copland, Frederico Hamman, MD  amLODipine (NORVASC) 10 MG tablet TAKE 1 TABLET BY MOUTH DAILY 06/29/22   Copland, Frederico Hamman, MD  aspirin 81 MG chewable tablet Chew 81 mg by mouth  daily.    [provider]  atorvastatin (LIPITOR) 40 MG tablet TAKE ONE TABLET BY MOUTH EVERY DAY 04/20/22   Bedsole, Amy E, MD  citalopram (CELEXA) 10 MG tablet TAKE ONE TABLET BY MOUTH EVERY DAY 03/30/22   Copland, Frederico Hamman, MD  dorzolamide-timolol (COSOPT) 22.3-6.8 MG/ML ophthalmic solution 1 drop 2 (two) times daily. 07/09/21   [provider]  fluticasone-salmeterol (ADVAIR) 250-50 MCG/ACT AEPB INHALE 1 PUFF TWICE DAILY 05/21/22   Copland, Frederico Hamman, MD  metFORMIN (GLUCOPHAGE-XR) 500 MG 24 hr tablet TAKE 3 TABLETS BY MOUTH DAILY 04/20/22   Bedsole, Amy E, MD  nitroGLYCERIN (NITROSTAT) 0.4 MG SL tablet Place 1 tablet (0.4 mg total) under the tongue every 5 (five) minutes as needed for chest pain. 05/19/17   Theora Gianotti, NP    Physical Exam: Vitals:   08/05/22 1300 08/05/22 1535 08/05/22 1535 08/05/22 1629  BP: (!) 144/85  (!) 160/65   Pulse:  78  81   Resp: 20  20   Temp: 98.4 F (36.9 C)  98.7 F (37.1 C)   TempSrc: Oral  Oral   SpO2: 92%  94% 92%  Weight:  95.2 kg    Height:  '5\' 7"'$  (1.702 m)     General: Not in acute distress HEENT:       Eyes: PERRL, EOMI, no scleral icterus.       ENT: No discharge from the ears and nose, no pharynx injection, no tonsillar enlargement.        Neck: No JVD, no bruit, no mass felt. Heme: No neck lymph node enlargement. Cardiac: S1/S2, RRR, No murmurs, No gallops or rubs. Respiratory: No rales, wheezing, rhonchi or rubs.  GI: Soft, nondistended, nontender, no rebound pain, no organomegaly, BS present. GU: No hematuria Ext: No pitting leg edema bilaterally. 1+DP/PT pulse bilaterally. Musculoskeletal: No joint deformities, No joint redness or warmth, no limitation of ROM in spin. Skin: No rashes.  Neuro: Alert, oriented X3, cranial nerves II-XII grossly intact, moves all extremities normally.  Psych: Patient is not psychotic, no suicidal or hemocidal ideation.  Labs on Admission: I have personally reviewed following labs and imaging studies  CBC: Recent Labs  Lab 08/05/22 1050  WBC 12.5*  NEUTROABS 12.5*  HGB 17.0  HCT 49.3  MCV 83.3  PLT 170   Basic Metabolic Panel: Recent Labs  Lab 08/05/22 0604  NA 140  K 4.3  CL 111  CO2 20*  GLUCOSE 161*  BUN 15  CREATININE 1.01  CALCIUM 9.4   GFR: Estimated Creatinine Clearance: 69.5 mL/min (by C-G formula based on SCr of 1.01 mg/dL). Liver Function Tests: Recent Labs  Lab 08/05/22 0604  AST 17  ALT 15  ALKPHOS 126  BILITOT 1.5*  PROT 8.0  ALBUMIN 4.7   No results for input(s): "LIPASE", "AMYLASE" in the last 168 hours. No results for input(s): "AMMONIA" in the last 168 hours. Coagulation Profile: No results for input(s): "INR", "PROTIME" in the last 168 hours. Cardiac Enzymes: No results for input(s): "CKTOTAL", "CKMB", "CKMBINDEX", "TROPONINI" in the last 168 hours. BNP (last 3 results) No results for  input(s): "PROBNP" in the last 8760 hours. HbA1C: No results for input(s): "HGBA1C" in the last 72 hours. CBG: Recent Labs  Lab 08/05/22 1144  GLUCAP 163*   Lipid Profile: No results for input(s): "CHOL", "HDL", "LDLCALC", "TRIG", "CHOLHDL", "LDLDIRECT" in the last 72 hours. Thyroid Function Tests: No results for input(s): "TSH", "T4TOTAL", "FREET4", "T3FREE", "THYROIDAB" in the last 72 hours. Anemia Panel: No  results for input(s): "VITAMINB12", "FOLATE", "FERRITIN", "TIBC", "IRON", "RETICCTPCT" in the last 72 hours. Urine analysis:    Component Value Date/Time   COLORURINE YELLOW (A) 05/14/2017 1153   APPEARANCEUR CLEAR (A) 05/14/2017 1153   APPEARANCEUR Clear 08/10/2014 0207   LABSPEC 1.015 05/14/2017 1153   LABSPEC 1.014 08/10/2014 0207   PHURINE 5.0 05/14/2017 1153   GLUCOSEU >=500 (A) 05/14/2017 1153   GLUCOSEU Negative 08/10/2014 0207   HGBUR NEGATIVE 05/14/2017 1153   BILIRUBINUR NEGATIVE 05/14/2017 1153   BILIRUBINUR Negative 08/10/2014 0207   KETONESUR NEGATIVE 05/14/2017 1153   PROTEINUR NEGATIVE 05/14/2017 1153   NITRITE NEGATIVE 05/14/2017 1153   LEUKOCYTESUR NEGATIVE 05/14/2017 1153   LEUKOCYTESUR Negative 08/10/2014 0207   Sepsis Labs: '@LABRCNTIP'$ (procalcitonin:4,lacticidven:4) ) Recent Results (from the past 240 hour(s))  SARS Coronavirus 2 by RT PCR (hospital order, performed in [place] hospital lab) *cepheid single result test* Anterior Nasal Swab     Status: None   Collection Time: 08/05/22  6:04 AM   Specimen: Anterior Nasal Swab  Result Value Ref Range Status   SARS Coronavirus 2 by RT PCR NEGATIVE NEGATIVE Final    Comment: (NOTE) SARS-CoV-2 target nucleic acids are NOT DETECTED.  The SARS-CoV-2 RNA is generally detectable in upper and lower respiratory specimens during the acute phase of infection. The lowest concentration of SARS-CoV-2 viral copies this assay can detect is 250 copies / mL. A negative result does not preclude SARS-CoV-2  infection and should not be used as the sole basis for treatment or other patient management decisions.  A negative result may occur with improper specimen collection / handling, submission of specimen other than nasopharyngeal swab, presence of viral mutation(s) within the areas targeted by this assay, and inadequate number of viral copies (<250 copies / mL). A negative result must be combined with clinical observations, patient history, and epidemiological information.  Fact Sheet for Patients:   https://www.patel.info/  Fact Sheet for Healthcare Providers: https://hall.com/  This test is not yet approved or  cleared by the [place] FDA and has been authorized for detection and/or diagnosis of SARS-CoV-2 by FDA under an Emergency Use Authorization (EUA).  This EUA will remain in effect (meaning this test can be used) for the duration of the COVID-19 declaration under Section 564(b)(1) of the Act, 21 U.S.C. section 360bbb-3(b)(1), unless the authorization is terminated or revoked sooner.  Performed at [facility], [address] [place], [place] [address]      Radiological Exams on Admission: CT Chest Wo Contrast  Result Date: 08/05/2022 CLINICAL DATA:  History of lung nodule and recent history of chest pain and radiographic and/or clinical diagnosis of pneumonia. EXAM: CT CHEST WITHOUT CONTRAST TECHNIQUE: Multidetector CT imaging of the chest was performed following the standard protocol without IV contrast. RADIATION DOSE REDUCTION: This exam was performed according to the departmental dose-optimization program which includes automated exposure control, adjustment of the mA and/or kV according to patient size and/or use of iterative reconstruction technique. COMPARISON:  August 05, 2022. FINDINGS: Cardiovascular: Calcified aortic atherosclerosis. No signs of aneurysmal dilation of the thoracic aorta. Normal heart size. No  pericardial effusion. Three-vessel coronary artery disease is extensive. Central pulmonary vasculature is normal caliber. Limited assessment of cardiovascular structures given lack of intravenous contrast. Mediastinum/Nodes: Thoracic inlet structures are normal. No axillary lymphadenopathy. No mediastinal lymphadenopathy. No hilar lymphadenopathy. Mildly prominent subcarinal nodal tissue measures 14 mm short axis. There is material in the esophagus which is mildly patulous. A appearance of this material is nearly  stool-like and more focal in the distal esophagus implying stasis. Lungs/Pleura: Patchy areas of nodularity with surrounding ground-glass opacity in the RIGHT lower lobe and RIGHT middle lobe in particular are superimposed on background changes of pulmonary emphysema. Medial LEFT lung base also with less pronounced nodularity which is more typical of tree in bud opacities. There are small amounts of material within the bronchus intermedius. (Image 94/3) 7 mm RIGHT lower lobe pulmonary nodule. Tiny cavitary nodule on image 93/3 at 5 mm. (Image 86/3) 7 mm pulmonary nodule. More pronounced tree-in-bud type nodularity is present at the periphery and in the RIGHT middle lobe is There is material within some bronchi and RIGHT middle lobe and RIGHT lower lobe. Upper Abdomen: Incidental imaging of upper abdominal contents shows no acute process. Post cholecystectomy. Small hiatal hernia. Musculoskeletal: No acute bone finding. No destructive bone process. Spinal degenerative changes. IMPRESSION: 1. Patchy areas of nodularity with surrounding ground-glass opacity in the RIGHT lower lobe and RIGHT middle lobe in particular are superimposed on background changes of pulmonary emphysema. Findings are most suggestive of multifocal pneumonia, perhaps related to aspiration. Discrete nodularity is present in addition to tree-in-bud opacities. Would include atypical infection including nontuberculous in tuberculous  mycobacterial infection in the differential. Would also suggest blood cultures to exclude the possibility of an atypical presentation of septic emboli and include follow-up as outlined previously to ensure resolution of the above process. 2. Subcarinal nodal enlargement new from previous imaging, perhaps reactive but would suggest attention on follow-up. 3. Ultimately pulmonary consultation may be helpful for further evaluation given persistence and or recurrence when compared to imaging from March. 4. Stool like material in the esophagus. Swallow evaluation and or dedicated esophageal evaluation may be helpful. More focal area in the distal esophagus does not allow for exclusion of distal esophageal neoplasm or stricture. 5. Three-vessel coronary artery disease. 6. Small hiatal hernia. Aortic Atherosclerosis (ICD10-I70.0) and Emphysema (ICD10-J43.9). Electronically Signed   By: Zetta Bills M.D.   On: 08/05/2022 09:02   DG Chest 2 View  Result Date: 08/05/2022 CLINICAL DATA:  Shortness of breath.  Productive sputum EXAM: CHEST - 2 VIEW COMPARISON:  02/22/2021 FINDINGS: Chronic lung disease with interstitial coarsening. Indistinct opacity at the right middle lobe. Normal heart size and mediastinal contours. IMPRESSION: 1. Right middle lobe pneumonia. Followup PA and lateral chest X-ray is recommended in 3-4 weeks following trial of antibiotic therapy to ensure resolution and exclude underlying malignancy, similar opacity was seen in this location 02/22/2021. 2. COPD Electronically Signed   By: Jorje Guild M.D.   On: 08/05/2022 07:11      Assessment/Plan Principal Problem:   Multifocal pneumonia Active Problems:   Aspiration pneumonia (HCC)   Sepsis (HCC)   COPD (chronic obstructive pulmonary disease) (HCC)   CAD (coronary artery disease)   Chronic diastolic CHF (congestive heart failure) (HCC)   DM (diabetes mellitus) type II, controlled, with peripheral vascular disorder ([place])   Essential  hypertension   Hyperlipidemia LDL goal <70   Major depressive disorder, recurrent, in remission ([place])   Obesity with body mass index (BMI) of 30.0 to 39.9   Assessment and Plan: * Multifocal pneumonia Sepsis due to multifocal PAN and possible aspiration PNA: pt meets criteria for sepsis with WBC 12.5, RR 24. Lactic acid 1.0. pt has hx of severe allergic reaction to Penicillin  - Admit to tele bed as inpt - levaquine and flagyl - Mucinex for cough  - Bronchodilators - Urine legionella and S. pneumococcal antigen -  Follow up blood culture x2, sputum culture - will get Procalcitonin - IVF: no IVF, lactic acid is normal. - f/u SLP     Aspiration pneumonia (HCC) -see above -f/u SLP  Sepsis ([place]) -see above  COPD (chronic obstructive pulmonary disease) (HCC) No wheezing and rhonchi. -bronchodilators  CAD (coronary artery disease) Trop 8 --> 8. -ASA, Lipitor  Chronic diastolic CHF (congestive heart failure) ([place]) 2d echo on 05/14/17 showed EF 60-65% with grade I diastolic dysFx. No leg edema. BNP 78.2. CHF is compensated. -watch volume status closely.   DM (diabetes mellitus) type II, controlled, with peripheral vascular disorder ([place]) Recent A1c 6.2, well controlled. Pt is taking metformin -SSI.  Essential hypertension -continue home amlodipine  -IV Hydralazine prn  Hyperlipidemia LDL goal <70 -lipitor  Major depressive disorder, recurrent, in remission ([place]) -continue home meds, cellexa  Obesity with body mass index (BMI) of 30.0 to 39.9  BMI= 32.87  and BW= 95.2 -Diet and exercise.   -Encourage to lose weight.            DVT ppx: SCD (pt is allergic to lovenox)  Code Status: Full code  Family Communication:    Yes, patient's  wife  at bed side.     Disposition Plan:  Anticipate discharge back to previous environment  Consults called:  none  Admission status and Level of care: Telemetry Medical:    as inpt     Dispo: The patient is  from: Home              Anticipated d/c is to: Home              Anticipated d/c date is: 2 days              Patient currently is not medically stable to d/c.    Severity of Illness:  The appropriate patient status for this patient is INPATIENT. Inpatient status is judged to be reasonable and necessary in order to provide the required intensity of service to ensure the patient's safety. The patient's presenting symptoms, physical exam findings, and initial radiographic and laboratory data in the context of their chronic comorbidities is felt to place them at high risk for further clinical deterioration. Furthermore, it is not anticipated that the patient will be medically stable for discharge from the hospital within 2 midnights of admission.   * I certify that at the point of admission it is my clinical judgment that the patient will require inpatient hospital care spanning beyond 2 midnights from the point of admission due to high intensity of service, high risk for further deterioration and high frequency of surveillance required.*       Date of Service 08/05/2022    Ivor Costa [facility]   If 7PM-7AM, please contact night-coverage [email or web address] 08/05/2022, 5:27 PM

## 2022-08-05 NOTE — Assessment & Plan Note (Signed)
-  continue home meds, cellexa

## 2022-08-05 NOTE — Assessment & Plan Note (Signed)
-  see above -f/u SLP

## 2022-08-05 NOTE — ED Notes (Signed)
Pt SpO2 86% on room air. Pt placed on 2 liter O2 via [place].

## 2022-08-05 NOTE — Assessment & Plan Note (Signed)
-  continue home amlodipine  -IV Hydralazine prn

## 2022-08-05 NOTE — Assessment & Plan Note (Addendum)
Sepsis due to multifocal PAN and possible aspiration PNA: pt meets criteria for sepsis with WBC 12.5, RR 24. Lactic acid 1.0. pt has hx of severe allergic reaction to Penicillin Procalcitonin negative.  Strep pneumo antigen negative, Legionella pending. -Continue with Levaquin and Flagyl -Continue with supplemental oxygen-wean as tolerated -Continue with supportive care

## 2022-08-05 NOTE — Progress Notes (Signed)
Pharmacy Antibiotic Note  [name] is a 76 y.o. male admitted on 08/05/2022 with pneumonia.  Pharmacy has been consulted for Levaquin dosing.  -discussed with Dr. Jari Pigg and Dr. Blaine Hamper: Dr Blaine Hamper wants Levaquin d/t pt w/ severe rash and facial swelling from PCN-  will update allergy information in chart  Plan: Will order Levaquin 750 mg IV q24h for Crcl 72.4 mlmin  F/u renal fxn, cultures, etc  Height: '5\' 8"'$  (172.7 cm) Weight: 99.8 kg (220 lb) IBW/kg (Calculated) : 68.4  Temp (24hrs), Avg:97.7 F (36.5 C), Min:97.7 F (36.5 C), Max:97.7 F (36.5 C)  Recent Labs  Lab 08/05/22 0604  CREATININE 1.01    Estimated Creatinine Clearance: 72.4 mL/min (by C-G formula based on SCr of 1.01 mg/dL).    Allergies  Allergen Reactions   Contrast Media [Iodinated Contrast Media] Hives   Penicillins Swelling and Other (See Comments)    08/05/22 Per MD: pt had severe rash and facial swelling with PCN     Xarelto [Rivaroxaban] Other (See Comments)    GI Bleed   Clindamycin/Lincomycin Rash   Dye Fdc Red [Red Dye] Rash   Lovenox [Enoxaparin Sodium] Rash    Antimicrobials this admission: Levaquin 9/6 >>       >>    Dose adjustments this admission:    Microbiology results:   BCx:     UCx:      Sputum:      MRSA PCR:    Thank you for allowing pharmacy to be a part of this patient's care.  Jasai Sorg A 08/05/2022 8:33 AM

## 2022-08-05 NOTE — Assessment & Plan Note (Signed)
Recent A1c 6.2, well controlled. Pt is taking metformin -SSI.

## 2022-08-05 NOTE — ED Triage Notes (Signed)
Patient ambulatory to triage with steady gait, without difficulty or distress noted; pt reports prod cough yellow sputum since yesterday with [facility] and upper CP

## 2022-08-05 NOTE — ED Provider Notes (Signed)
[facility] Provider Note    Event Date/Time   First MD Initiated Contact with Patient 08/05/22 [phone]     (approximate)   History   Chest Pain   HPI  [name] is a 76 y.o. male with history of coronary disease, COPD, CHF who comes in with concerns for productive cough, yellow sputum since yesterday with shortness of breath and upper chest pain.  Patient reports symptoms started yesterday of cough, some chest discomfort and shortness of breath.  He does report coughing up some sputum.  He does report having a history of COPD but he is not on any oxygen at home.  He denies any known history of blood clots.  He was previously on Xarelto but had an allergy and he is not sure why he was on it previously but it appears that led to a GI bleed.  He denies any falls, hitting his head or any abdominal pain.  Physical Exam   Triage Vital Signs: ED Triage Vitals  Enc Vitals Group     BP 08/05/22 0556 (!) 151/68     Pulse Rate 08/05/22 0556 90     Resp 08/05/22 0556 18     Temp 08/05/22 0556 97.7 F (36.5 C)     Temp Source 08/05/22 0556 Oral     SpO2 08/05/22 0556 92 %     Weight 08/05/22 0555 220 lb (99.8 kg)     Height 08/05/22 0555 '5\' 8"'$  (1.727 m)     Head Circumference --      Peak Flow --      Pain Score 08/05/22 0555 7     Pain Loc --      Pain Edu? --      Excl. in [place]? --     Most recent vital signs: Vitals:   08/05/22 0556  BP: (!) 151/68  Pulse: 90  Resp: 18  Temp: 97.7 F (36.5 C)  SpO2: 92%     General: Awake, no distress.  CV:  Good peripheral perfusion.  Resp:  Normal effort.  Abd:  No distention.  Other:  No swelling the legs.  No calf tenderness.   ED Results / Procedures / Treatments   Labs (all labs ordered are listed, but only abnormal results are displayed) Labs Reviewed  COMPREHENSIVE METABOLIC PANEL - Abnormal; Notable for the following components:      Result Value   CO2 20 (*)    Glucose, Bld 161 (*)    Total  Bilirubin 1.5 (*)    All other components within normal limits  SARS CORONAVIRUS 2 BY RT PCR  CBC WITH DIFFERENTIAL/PLATELET  TROPONIN I (HIGH SENSITIVITY)  TROPONIN I (HIGH SENSITIVITY)     EKG  My interpretation of EKG:  Sinus rate of 86 without any ST elevation or T wave inversions, first-degree AV block  RADIOLOGY I have reviewed the xray personally and interpreted and patient has right middle lobe pneumonia  PROCEDURES:  Critical Care performed: Yes, see critical care procedure note(s)  .1-3 Lead EKG Interpretation  Performed by: Vanessa Muddy, MD Authorized by: Vanessa Bronson, MD     Interpretation: normal     ECG rate:  90   ECG rate assessment: normal     Rhythm: sinus rhythm     Ectopy: none     Conduction: normal   .Critical Care  Performed by: Vanessa [place], MD Authorized by: Vanessa [place], MD   Critical care provider statement:  Critical care time (minutes):  30   Critical care was necessary to treat or prevent imminent or life-threatening deterioration of the following conditions:  Respiratory failure   Critical care was time spent personally by me on the following activities:  Development of treatment plan with patient or surrogate, discussions with consultants, evaluation of patient's response to treatment, examination of patient, ordering and review of laboratory studies, ordering and review of radiographic studies, ordering and performing treatments and interventions, pulse oximetry, re-evaluation of patient's condition and review of old charts    [place] ED: Medications - No data to display   IMPRESSION / MDM / [place] / ED COURSE  I reviewed the triage vital signs and the nursing notes.   Patient's presentation is most consistent with acute presentation with potential threat to life or bodily function.   Patient comes in hypoxic.  When I saw patient I placed him on the monitor and his oxygen level was 85% with a good  waveform.  Denies a history of being on oxygen at home.  Patient was placed on oxygen.  Suspect this could be combination of COPD with pneumonia noted on the x-ray.  I considered the possibility of pulmonary embolism but he has a contrast allergy and it seems less likely given no swelling the legs.  No calf tenderness and he has symptoms to suggest more likely pneumonia.  However his x-ray did show some signs of similar findings back in March 2022 so we will get CT without contrast just to ensure there is no underlying mass.  Will discuss to the hospital team for admission.  Last time patient had this happen he was she would Levaquin due to allergy to penicillin.  We will also give a dose of steroids and DuoNeb due to COPD  CMP reassuring troponin is negative X-ray is concerning for pneumonia.  Patient did have a prior x-ray back in March 2022 with similar findings  The patient is on the cardiac monitor to evaluate for evidence of arrhythmia and/or significant heart rate changes.  Patient was treated with a course of antibiotics      FINAL CLINICAL IMPRESSION(S) / ED DIAGNOSES   Final diagnoses:  Pneumonia of right middle lobe due to infectious organism  Acute respiratory failure with hypoxia ([place])     Rx / DC Orders   ED Discharge Orders     None        Note:  This document was prepared using Dragon voice recognition software and may include unintentional dictation errors.   Vanessa [place], MD 08/05/22 [phone]

## 2022-08-05 NOTE — Assessment & Plan Note (Signed)
  BMI= 32.87  and BW= 95.2 -Diet and exercise.   -Encourage to lose weight.

## 2022-08-06 DIAGNOSIS — J189 Pneumonia, unspecified organism: Secondary | ICD-10-CM | POA: Diagnosis not present

## 2022-08-06 LAB — LEGIONELLA PNEUMOPHILA SEROGP 1 UR AG: L. pneumophila Serogp 1 Ur Ag: NEGATIVE

## 2022-08-06 LAB — CBC
HCT: 42.7 % (ref 39.0–52.0)
Hemoglobin: 14.5 g/dL (ref 13.0–17.0)
MCH: 29.7 pg (ref 26.0–34.0)
MCHC: 34 g/dL (ref 30.0–36.0)
MCV: 87.3 fL (ref 80.0–100.0)
Platelets: 127 10*3/uL — ABNORMAL LOW (ref 150–400)
RBC: 4.89 MIL/uL (ref 4.22–5.81)
RDW: 12.8 % (ref 11.5–15.5)
WBC: 11.4 10*3/uL — ABNORMAL HIGH (ref 4.0–10.5)
nRBC: 0 % (ref 0.0–0.2)

## 2022-08-06 LAB — GLUCOSE, CAPILLARY
Glucose-Capillary: 210 mg/dL — ABNORMAL HIGH (ref 70–99)
Glucose-Capillary: 205 mg/dL — ABNORMAL HIGH (ref 70–99)
Glucose-Capillary: 151 mg/dL — ABNORMAL HIGH (ref 70–99)
Glucose-Capillary: 174 mg/dL — ABNORMAL HIGH (ref 70–99)

## 2022-08-06 MED ORDER — PANTOPRAZOLE SODIUM 40 MG PO TBEC
40.0000 mg | DELAYED_RELEASE_TABLET | Freq: Every day | ORAL | Status: DC
  Administered 2022-08-07: 40 mg via ORAL
  Filled 2022-08-06: qty 1

## 2022-08-06 NOTE — Evaluation (Signed)
Clinical/Bedside Swallow Evaluation Patient Details  Name: [name] MRN: [id number] Date of Birth: Sep 05, 1946  Today's Date: 08/06/2022 Time: SLP Start Time (ACUTE ONLY): 0915 SLP Stop Time (ACUTE ONLY): 1000 SLP Time Calculation (min) (ACUTE ONLY): 45 min  Past Medical History:  Past Medical History:  Diagnosis Date   Aortic transection 2005   Traumatic after a fall from a second floor. s/p repair at [facility]   Basal cell carcinoma    CAD (coronary artery disease) 1992   a. 1992 Acute anterior MI, thrombolytic therapy-->cath reportedly w/o significant CAD; b. 08/2016 MV: EF 57%, hypertensive respons, no ischemia/infarct; c. 04/2017 CTA chest w/ coronary Ca2+.   Depression    Diastolic dysfunction    a. 04/2017 Echo: EF 60-65%, Gr1 DD, nl RV fxn.   Erectile dysfunction    Fall 2005   fell off house: torn aorta, clavicle fracture, rib fracture, vertebral fractures, lung contusion, coma x 2 weeks   Fracture of femoral neck, left ([place]) 08/09/2014   ORIF, Dr. Marry Guan   Fracture of radial neck, left, closed 08/09/2014   Gallstone pancreatitis    Gastric ulcer    History of ATN 08/09/2014   [facility], 2 days of dialysis ([facility])   Hyperlipidemia    Statin with joint pain    Hypertension    Obesity    Peripheral vascular disease ([place])    Atherosclerotic:R renal artery stenosis   Type II diabetes mellitus ([place])    Upper GI bleed    a. 04/2017 admit w/ melena and HGB down to 7 req prbc's; b. 04/2017 EGD: small HH, non-bleeding gastric & duod ulcers, non-bleeding erosive gastropathy-->PPI Rx.   Past Surgical History:  Past Surgical History:  Procedure Laterality Date   CARDIAC CATHETERIZATION  08/1991   50 % mid-Lad stenosis with clot, 25-505 second marginal   CARDIOVASCULAR SURGERY     with ruptured Aorta, Dr. Camila Li, [place]     ESOPHAGOGASTRODUODENOSCOPY (EGD) WITH PROPOFOL N/A 05/03/2017   Procedure: ESOPHAGOGASTRODUODENOSCOPY (EGD) WITH PROPOFOL;  Surgeon: Lucilla Lame, MD;   Location: [facility] ENDOSCOPY;  Service: Endoscopy;  Laterality: N/A;   KNEE SURGERY     Right    ORIF HIP FRACTURE Left 08/12/2014   Dr. Marry Guan   THORACOTOMY     thoracic aorta repair    TRACHEOSTOMY     s/p reversal   VIDEO BRONCHOSCOPY Bilateral 05/27/2017   Procedure: VIDEO BRONCHOSCOPY WITHOUT FLUORO;  Surgeon: Juanito Doom, MD;  Location: [facility] ENDOSCOPY;  Service: Cardiopulmonary;  Laterality: Bilateral;   HPI:  Pt  is a 76 y.o. male with medical history significant of Obesity, hypertension, hyperlipidemia, diabetes mellitus, COPD, depression, GI bleeding, dCHF, CAD, aortic transection (s/p for repair), OSA not using CPAP currently, PVD, who presents with progressively worsening shortness breath and cough for 2 days.   Patient has productive cough with yellow-colored sputum production.  No fever or chills.  Patient had some mild frontal chest pain initially, which has subsided.  No nausea vomiting, diarrhea or abdominal pain.  No symptoms of UTI.  Patient is not using oxygen normally at home.  Patient was found to have 1 episode of oxygen desaturation to 86% on room air, which improved to 92-94% on room air later.  No acute respiratory distress.    Chest CT:  "Stool like material in the Esophagus. Swallow evaluation and or  dedicated esophageal evaluation may be helpful. More focal area in the distal esophagus does not allow for exclusion of distal  esophageal neoplasm or stricture;  Hiatal Hernia;  Patchy areas of nodularity with surrounding ground-glass opacity  in the RIGHT lower lobe and RIGHT middle lobe in particular are  superimposed on background changes of pulmonary emphysema. Findings  are most suggestive of multifocal pneumonia, perhaps related to  aspiration. Discrete nodularity is present in addition to  tree-in-bud opacities. Would include atypical infection including  nontuberculous in tuberculous mycobacterial infection in the  differential.".    OF NOTE: Endoscopy completed in  2018 showing Hiatal Hernia then.  Pt is not on a PPI and does not follow w/ GI.     Assessment / Plan / Recommendation  Clinical Impression    Pt seen for BSE this morning post breakfast meal. Pt denied any difficulty eating/drinking during the meal. He presented as A/O x4; engaged easily w/ this therapist but seemed to have little to no memory of Endoscopy procedure in 2018 discussing dx'd Hiatal Hernia. He denied a dx of REFLUX but ENDORSED s/s of REFLUX "sometimes" at home.   Afebrile, WBC 11.4; [place] O2 2L.    OF NOTE: Pt endorses s/s of REFLUX at home; w/ episodes of coughing and having to spit up/out increased phlegm. He also endorses feelings of fullness around/after meals, belching, and food "stopping" in his chest "sometimes". Baseline Dx'd Hiatal Hernia; also see Chest CT this admit indicating Esophageal issues. No recent pneumonia dx'd; pt denied upper respiratory deficits.   Pt appears to present w/ adequate oropharyngeal phase swallowing function w/ No overt oropharyngeal phase dysphagia appreciated during oral intake of trials w/ this SLP. No neuromuscular swallowing deficits appreciated.  Pt appears at reduced risk for aspiration from an oropharyngeal phase standpoint following general aspiration precautions. HOWEVER, pt has a baseline presentation of REFLUX and episodes of REFLUX behavior, w/ Hiatal Hernia. He is not on a PPI -- this has been requested to MD. ANY Dysmotility or Regurgitation of Reflux material can increase risk for aspiration of the Reflux material during Retrograde flow thus impact Voicing and Pulmonary status. Pt described issues of globus, hacking cough w/ phlegm, fullness feelings when eating.    Pt was sitting upright in bed and fed self trials including thin liquids Via Cup, purees, and soft solids. No immediate, overt clinical s/s of aspiration noted; clear vocal quality b/t trials, no decline in pulmonary status, no multiple swallows noted post initial pharyngeal  swallow. Oral phase appeared [facility] for bolus management/control, mastication, and timely A-P transfer/clearing of material. OM exam was [facility] for oral clearing; lingual/labial movements. No unilateral weakness. Dentures+. Speech clear.    Recommend continue a fairly Regular diet (moistened, cut foods) w/ thin liquids. General aspiration precautions. Rest Breaks during meals/oral intake to allow for Esophageal clearing. REFLUX precautions strongly recommended to lessen chance for Regurgitation --  do not lie down post meals for ~45-60 mins and HOB elevated at night when sleeping. MD has initiated a PPI for pt -- this was discussed w/ pt also.   Recommend pt f/u w/ GI for assessment/management of Reflux and tx of any Esophageal phase Dysmotility. Discussion and Handouts given on REFLUX, impact of REFLUX on swallowing, behaviors to manage REFLUX, and foods/diet.  MD to reconsult ST services if any new needs while admitted. NSG updated. Pt and Family appreciative of Education information. SLP Visit Diagnosis: Dysphagia, unspecified (R13.10) (Esophageal phase Dysmotiltiy, Hiatal Hernia, REFLUX)    Aspiration Risk   (REDUCED from an oropharyngeal phase standpoint)    Diet Recommendation   a fairly Regular diet (moistened,  cut foods) w/ thin liquids. Avoid problematic foods. General aspiration precautions. Rest Breaks during meals/oral intake to allow for Esophageal clearing. REFLUX precautions strongly recommended to lessen chance for Regurgitation --  do not lie down post meals for ~45-60 mins and HOB elevated at night when sleeping  Medication Administration: Whole meds with liquid (but Whole in liquid if indicated)    Other  Recommendations Recommended Consults: Consider GI evaluation;Consider esophageal assessment Oral Care Recommendations: Oral care BID;Oral care before and after PO;Patient independent with oral care (setup) Other Recommendations:  (n/a)    Recommendations for follow up therapy are one  component of a multi-disciplinary discharge planning process, led by the attending physician.  Recommendations may be updated based on patient status, additional functional criteria and insurance authorization.  Follow up Recommendations No SLP follow up      Assistance Recommended at Discharge PRN  Functional Status Assessment Patient has had a recent decline in their functional status and demonstrates the ability to make significant improvements in function in a reasonable and predictable amount of time.  Frequency and Duration  (n/a)   (n/a)       Prognosis Prognosis for Safe Diet Advancement: Good Barriers to Reach Goals: Time post onset;Severity of deficits Barriers/Prognosis Comment: baseline Esophageal phase Dysmotiltiy, Hiatal Hernia, REFLUX      Swallow Study   General Date of Onset: 08/05/22 HPI: Pt  is a 76 y.o. male with medical history significant of Obesity, hypertension, hyperlipidemia, diabetes mellitus, COPD, depression, GI bleeding, dCHF, CAD, aortic transection (s/p for repair), OSA not using CPAP currently, PVD, who presents with progressively worsening shortness breath and cough for 2 days.   Patient has productive cough with yellow-colored sputum production.  No fever or chills.  Patient had some mild frontal chest pain initially, which has subsided.  No nausea vomiting, diarrhea or abdominal pain.  No symptoms of UTI.  Patient is not using oxygen normally at home.  Patient was found to have 1 episode of oxygen desaturation to 86% on room air, which improved to 92-94% on room air later.  No acute respiratory distress.  Chest CT:  "Stool like material in the esophagus. Swallow evaluation and or  dedicated esophageal evaluation may be helpful. More focal area in  the distal esophagus does not allow for exclusion of distal  esophageal neoplasm or stricture;  Hiatal Hernia;  Patchy areas of nodularity with surrounding ground-glass opacity  in the RIGHT lower lobe and RIGHT  middle lobe in particular are  superimposed on background changes of pulmonary emphysema. Findings  are most suggestive of multifocal pneumonia, perhaps related to  aspiration. Discrete nodularity is present in addition to  tree-in-bud opacities. Would include atypical infection including  nontuberculous in tuberculous mycobacterial infection in the  differential.".   OF NOTE: Endoscopy completed in 2018 showing Hiatal Hernia then.  Pt is not on a PPI and does not follow w/ GI. Type of Study: Bedside Swallow Evaluation Previous Swallow Assessment: none Diet Prior to this Study: Regular;Thin liquids Temperature Spikes Noted: No (wbc 11.4) Respiratory Status: Nasal cannula (2L) History of Recent Intubation: No Behavior/Cognition: Alert;Cooperative;Pleasant mood;Requires cueing (min) Oral Cavity Assessment: Within Functional Limits Oral Care Completed by SLP: Yes Oral Cavity - Dentition: Dentures, top;Dentures, bottom (in place) Vision: Functional for self-feeding Self-Feeding Abilities: Able to feed self;Needs set up Patient Positioning: Upright in bed Baseline Vocal Quality: Normal Volitional Cough: Strong Volitional Swallow: Able to elicit    Oral/Motor/Sensory Function Overall Oral Motor/Sensory Function: Within functional limits  Ice Chips Ice chips: Within functional limits Presentation: Spoon (fed; 2 trials)   Thin Liquid Thin Liquid: Within functional limits Presentation: Cup;Self Fed (~4 ozs+) Other Comments: does not use straws at home    Nectar Thick Nectar Thick Liquid: Not tested   Honey Thick Honey Thick Liquid: Not tested   Puree Puree: Within functional limits Presentation: Self Fed;Spoon (6-7 trials)   Solid     Solid: Within functional limits Presentation: Self Fed (10 trials) Other Comments: moistened foods        Orinda Kenner, MS, CCC-SLP Speech Language Pathologist Rehab Services; [place] [phone]  (ascom) Tressa Maldonado 08/06/2022,5:09 PM

## 2022-08-06 NOTE — Hospital Course (Addendum)
Taken from H&P.  HPI: [name] is a 76 y.o. male with medical history significant of hypertension, hyperlipidemia, diabetes mellitus, COPD, depression, GI bleeding, dCHF, CAD, aortic transection (s/p for repair), OSA not using CPAP currently, PVD, who presents with progressively worsening shortness breath and cough for 2 days.  Patient has productive cough with yellow-colored sputum production.  No fever or chills.  Patient had some mild frontal chest pain initially, which has subsided.  No nausea vomiting, diarrhea or abdominal pain.  No symptoms of UTI.  Patient is not using oxygen normally at home.  Patient was found to have 1 episode of oxygen desaturation to 86% on room air, which improved to 92-94% on room air later.  No acute respiratory distress.   Data reviewed independently and ED Course: pt was found to have WBC 12.5, BMP 78.2, troponin level 8, 8, negative COVID PCR, GFR> 60, temperature normal, blood pressure 146/78, heart rate 90, 68, RR 24, chest x-ray showed right middle lobe infiltration.     EKG: Sinus rhythm, QTc 418, LAE, nonspecific T wave change.  Patient was started on Levaquin due to history of severe penicillin allergies.  There was concern of aspiration, swallow evaluation ordered.  9/7: Remained afebrile.  Improving leukocytosis.  Procalcitonin negative.  Respiratory viral panel negative.  Strep pneumo antigen negative.  Preliminary blood cultures negative in 24 hours. Patient not having any significant upper respiratory symptoms. Remained on 2 L of oxygen with no baseline oxygen use. We will try weaning today. Also starting him on Protonix for concern of GERD with hiatal hernia as he will be high risk for aspiration. Swallow evaluation without any increased risk except GERD. CT was also concerning for some fecal like material in lower esophagus, questionable stricture.  Patient need to have a close follow-up with gastroenterologist as an outpatient for further  evaluation and recommendations.  9/8: Patient remained stable.  Continues to require up to 2 L of oxygen especially with ambulation he was becoming hypoxic in mid 80s.  Eating and drinking okay.  Would like to go home.  He is being discharged on 2 more days of Levaquin and Flagyl.  He was also given Protonix for concern of GERD. Patient need to follow-up with a gastroenterologist as an outpatient for further recommendations. Patient will need a repeat chest imaging in 3 to 4 weeks to see the resolution of pneumonia.  He is being discharged on 2 L of oxygen.  History of underlying COPD, his PCP can determine the further need for continuation of oxygen once pneumonia resolves.

## 2022-08-06 NOTE — Progress Notes (Signed)
Progress Note   Patient: [name] [facility] YWV:[id number] DOB: [birth year] DOA: 08/05/2022     1 DOS: the patient was seen and examined on 08/06/2022   Brief hospital course: Taken from H&P.  HPI: [name] is a 76 y.o. male with medical history significant of hypertension, hyperlipidemia, diabetes mellitus, COPD, depression, GI bleeding, dCHF, CAD, aortic transection (s/p for repair), OSA not using CPAP currently, PVD, who presents with progressively worsening shortness breath and cough for 2 days.  Patient has productive cough with yellow-colored sputum production.  No fever or chills.  Patient had some mild frontal chest pain initially, which has subsided.  No nausea vomiting, diarrhea or abdominal pain.  No symptoms of UTI.  Patient is not using oxygen normally at home.  Patient was found to have 1 episode of oxygen desaturation to 86% on room air, which improved to 92-94% on room air later.  No acute respiratory distress.   Data reviewed independently and ED Course: pt was found to have WBC 12.5, BMP 78.2, troponin level 8, 8, negative COVID PCR, GFR> 60, temperature normal, blood pressure 146/78, heart rate 90, 68, RR 24, chest x-ray showed right middle lobe infiltration.     EKG: Sinus rhythm, QTc 418, LAE, nonspecific T wave change.  Patient was started on Levaquin due to history of severe penicillin allergies.  There was concern of aspiration, swallow evaluation ordered.  9/7: Remained afebrile.  Improving leukocytosis.  Procalcitonin negative.  Respiratory viral panel negative.  Strep pneumo antigen negative.  Preliminary blood cultures negative in 24 hours. Patient not having any significant upper respiratory symptoms. Remained on 2 L of oxygen with no baseline oxygen use. We will try weaning today.   Assessment and Plan: * Multifocal pneumonia Sepsis due to multifocal PAN and possible aspiration PNA: pt meets criteria for sepsis with WBC 12.5, RR 24. Lactic acid 1.0. pt has hx of  severe allergic reaction to Penicillin Procalcitonin negative.  Strep pneumo antigen negative, Legionella pending. -Continue with Levaquin and Flagyl -Continue with supplemental oxygen-wean as tolerated -Continue with supportive care    Aspiration pneumonia ([place]) -see above -f/u SLP  Sepsis ([place]) -see above  COPD (chronic obstructive pulmonary disease) (HCC) No wheezing and rhonchi. -bronchodilators  CAD (coronary artery disease) Trop 8 --> 8. -ASA, Lipitor  Chronic diastolic CHF (congestive heart failure) ([place]) 2d echo on 05/14/17 showed EF 60-65% with grade I diastolic dysFx. No leg edema. BNP 78.2. CHF is compensated. -watch volume status closely.   DM (diabetes mellitus) type II, controlled, with peripheral vascular disorder ([place]) Recent A1c 6.2, well controlled. Pt is taking metformin -SSI.  Essential hypertension -continue home amlodipine  -IV Hydralazine prn  Hyperlipidemia LDL goal <70 -lipitor  Major depressive disorder, recurrent, in remission ([place]) -continue home meds, cellexa  Obesity with body mass index (BMI) of 30.0 to 39.9  BMI= 32.87  and BW= 95.2 -Diet and exercise.   -Encourage to lose weight.     Subjective: Patient was seen and examined today.  No new complaints.  Not much upper respiratory symptoms except mild cough.  No sputum production. No baseline oxygen use.  Physical Exam: Vitals:   08/05/22 1934 08/06/22 0408 08/06/22 0731 08/06/22 0744  BP: (!) 142/69 125/73  (!) 147/72  Pulse: 86 76  69  Resp: '20 20  18  '$ Temp: 98.2 F (36.8 C) 98.3 F (36.8 C)  98.1 F (36.7 C)  TempSrc: Oral Oral  Oral  SpO2: 93% 93% 94% 92%  Weight:  Height:       General.  Well-developed elderly man, in no acute distress. Pulmonary.  Lungs clear bilaterally, normal respiratory effort. CV.  Regular rate and rhythm, no JVD, rub or murmur. Abdomen.  Soft, nontender, nondistended, BS positive. CNS.  Alert and oriented .  No focal neurologic  deficit. Extremities.  No edema, no cyanosis, pulses intact and symmetrical. Psychiatry.  Judgment and insight appears normal.  Data Reviewed: Prior data reviewed  Family Communication: Discussed with patient  Disposition: Status is: Inpatient Remains inpatient appropriate because: Severity of illness   Planned Discharge Destination: Home  Time spent: 45 minutes  This record has been created using Systems analyst. Errors have been sought and corrected,but may not always be located. Such creation errors do not reflect on the standard of care.  Author: Lorella Nimrod, MD 08/06/2022 3:16 PM  For on call review [email or web address].

## 2022-08-06 NOTE — TOC Initial Note (Signed)
Transition of Care [facility]) - Initial/Assessment Note    Patient Details  Name: [name] MRN: [id number] Date of Birth: Sep 27, 1946  Transition of Care [facility]) CM/SW Contact:    Ninfa Meeker, RN Phone Number: 08/06/2022, 10:38 AM  Clinical Narrative:                 Transition of Care Screening Note:  Transition of Care Department [facility]) has reviewed patient and no TOC needs have been identified at this time. We will continue to monitor patient advancement through Interdisciplinary progressions. If new patient transition needs arise, please place a consult.         Patient Goals and CMS Choice        Expected Discharge Plan and Services                                                 Prior Living Arrangements/Services                       Activities of Daily Living Home Assistive Devices/Equipment: Eyeglasses, Dentures (specify type) ADL Screening (condition at time of admission) Patient's cognitive ability adequate to safely complete daily activities?: Yes Is the patient deaf or have difficulty hearing?: No Does the patient have difficulty seeing, even when wearing glasses/contacts?: No Does the patient have difficulty concentrating, remembering, or making decisions?: No Patient able to express need for assistance with ADLs?: Yes Does the patient have difficulty dressing or bathing?: No Independently performs ADLs?: Yes (appropriate for developmental age) Does the patient have difficulty walking or climbing stairs?: No Weakness of Legs: None Weakness of Arms/Hands: None  Permission Sought/Granted                  Emotional Assessment              Admission diagnosis:  COPD exacerbation ([place]) [J44.1] CAP (community acquired pneumonia) [J18.9] Acute respiratory failure with hypoxia ([place]) [J96.01] Pneumonia of right middle lobe due to infectious organism [J18.9] Patient Active Problem List   Diagnosis Date Noted   Multifocal  pneumonia 08/05/2022   Sepsis ([place]) 08/05/2022   Aspiration pneumonia ([place]) 08/05/2022   Obesity with body mass index (BMI) of 30.0 to 39.9    Chronic diastolic CHF (congestive heart failure) ([place]) 02/22/2021   COPD exacerbation ([place]) 02/22/2021   OSA (obstructive sleep apnea) 09/15/2017   Coronary artery disease of native artery of native heart with stable angina pectoris ([place]) 08/18/2017   COPD (chronic obstructive pulmonary disease) ([place]) 05/14/2017   Centrilobular emphysema ([place]) 09/12/2014   Aortic transection    Peripheral vascular disease ([place])    History of MI (myocardial infarction)    Duodenal ulcer with hemorrhage 04/30/2011   DM (diabetes mellitus) type II, controlled, with peripheral vascular disorder ([place]) 02/13/2010   Hyperlipidemia LDL goal <70 08/16/2008   ERECTILE DYSFUNCTION 08/16/2008   Major depressive disorder, recurrent, in remission ([place]) 08/16/2008   Essential hypertension 08/16/2008   CAD (coronary artery disease) 1992   PCP:  Pcp, No Pharmacy:   [place], [place] - [address] [place] [address] Phone: [phone] Fax: [phone]     Social Determinants of Health (SDOH) Interventions    Readmission Risk Interventions     No data to display

## 2022-08-06 NOTE — Care Management Important Message (Signed)
Important Message  Patient Details  Name: [name] MRN: [id number] Date of Birth: July 09, 1946   Medicare Important Message Given:  N/A - LOS <3 / Initial given by admissions     Dannette Barbara 08/06/2022, 2:12 PM

## 2022-08-07 DIAGNOSIS — J189 Pneumonia, unspecified organism: Secondary | ICD-10-CM | POA: Diagnosis not present

## 2022-08-07 LAB — GLUCOSE, CAPILLARY
Glucose-Capillary: 175 mg/dL — ABNORMAL HIGH (ref 70–99)
Glucose-Capillary: 171 mg/dL — ABNORMAL HIGH (ref 70–99)

## 2022-08-07 MED ORDER — LEVOFLOXACIN 750 MG PO TABS: 750.0000 mg | ORAL_TABLET | Freq: Every day | ORAL | 0 refills | Status: AC

## 2022-08-07 MED ORDER — METRONIDAZOLE 500 MG PO TABS
500.0000 mg | ORAL_TABLET | Freq: Two times a day (BID) | ORAL | Status: DC
  Administered 2022-08-07: 500 mg via ORAL
  Filled 2022-08-07: qty 1

## 2022-08-07 MED ORDER — METRONIDAZOLE 500 MG PO TABS: 500.0000 mg | ORAL_TABLET | Freq: Two times a day (BID) | ORAL | 0 refills | Status: AC

## 2022-08-07 MED ORDER — DM-GUAIFENESIN ER 30-600 MG PO TB12: 1.0000 | ORAL_TABLET | Freq: Two times a day (BID) | ORAL | 0 refills | Status: DC | PRN

## 2022-08-07 MED ORDER — PANTOPRAZOLE SODIUM 40 MG PO TBEC: 40.0000 mg | DELAYED_RELEASE_TABLET | Freq: Every day | ORAL | 2 refills | Status: DC

## 2022-08-07 MED ORDER — LEVOFLOXACIN 750 MG PO TABS: 750.0000 mg | ORAL_TABLET | Freq: Every day | ORAL | Status: DC

## 2022-08-07 NOTE — Discharge Summary (Signed)
Physician Discharge Summary   Patient: [name] MRN: [id number] DOB: [birth year]  Admit date:     08/05/2022  Discharge date: 08/07/22  Discharge Physician: Lorella Nimrod   PCP: Pcp, No   Recommendations at discharge:  Please obtain CBC and BMP in 1 week Patient is being discharged on 2 L of oxygen-please determine the need for continuation. Patient need to see a gastroenterologist for further evaluation and management of abnormal lower esophageal findings on CT scan. Repeat chest imaging in 3 to 4 weeks to see the resolution of pneumonia. Follow-up with primary care provider and pulmonologist  Discharge Diagnoses: Principal Problem:   Multifocal pneumonia Active Problems:   Aspiration pneumonia (HCC)   Sepsis ([place])   COPD (chronic obstructive pulmonary disease) (HCC)   CAD (coronary artery disease)   Chronic diastolic CHF (congestive heart failure) ([place])   DM (diabetes mellitus) type II, controlled, with peripheral vascular disorder ([place])   Essential hypertension   Hyperlipidemia LDL goal <70   Major depressive disorder, recurrent, in remission ([place])   Obesity with body mass index (BMI) of 30.0 to 39.9   Hospital Course: Taken from H&P.  HPI: [name] is a 76 y.o. male with medical history significant of hypertension, hyperlipidemia, diabetes mellitus, COPD, depression, GI bleeding, dCHF, CAD, aortic transection (s/p for repair), OSA not using CPAP currently, PVD, who presents with progressively worsening shortness breath and cough for 2 days.  Patient has productive cough with yellow-colored sputum production.  No fever or chills.  Patient had some mild frontal chest pain initially, which has subsided.  No nausea vomiting, diarrhea or abdominal pain.  No symptoms of UTI.  Patient is not using oxygen normally at home.  Patient was found to have 1 episode of oxygen desaturation to 86% on room air, which improved to 92-94% on room air later.  No acute respiratory distress.    Data reviewed independently and ED Course: pt was found to have WBC 12.5, BMP 78.2, troponin level 8, 8, negative COVID PCR, GFR> 60, temperature normal, blood pressure 146/78, heart rate 90, 68, RR 24, chest x-ray showed right middle lobe infiltration.     EKG: Sinus rhythm, QTc 418, LAE, nonspecific T wave change.  Patient was started on Levaquin due to history of severe penicillin allergies.  There was concern of aspiration, swallow evaluation ordered.  9/7: Remained afebrile.  Improving leukocytosis.  Procalcitonin negative.  Respiratory viral panel negative.  Strep pneumo antigen negative.  Preliminary blood cultures negative in 24 hours. Patient not having any significant upper respiratory symptoms. Remained on 2 L of oxygen with no baseline oxygen use. We will try weaning today. Also starting him on Protonix for concern of GERD with hiatal hernia as he will be high risk for aspiration. Swallow evaluation without any increased risk except GERD. CT was also concerning for some fecal like material in lower esophagus, questionable stricture.  Patient need to have a close follow-up with gastroenterologist as an outpatient for further evaluation and recommendations.  9/8: Patient remained stable.  Continues to require up to 2 L of oxygen especially with ambulation he was becoming hypoxic in mid 80s.  Eating and drinking okay.  Would like to go home.  He is being discharged on 2 more days of Levaquin and Flagyl.  He was also given Protonix for concern of GERD. Patient need to follow-up with a gastroenterologist as an outpatient for further recommendations. Patient will need a repeat chest imaging in 3 to 4 weeks to  see the resolution of pneumonia.  He is being discharged on 2 L of oxygen.  History of underlying COPD, his PCP can determine the further need for continuation of oxygen once pneumonia resolves.  Assessment and Plan: * Multifocal pneumonia Sepsis due to multifocal PAN and possible  aspiration PNA: pt meets criteria for sepsis with WBC 12.5, RR 24. Lactic acid 1.0. pt has hx of severe allergic reaction to Penicillin Procalcitonin negative.  Strep pneumo antigen negative, Legionella pending. -Continue with Levaquin and Flagyl -Continue with supplemental oxygen-wean as tolerated -Continue with supportive care    Aspiration pneumonia ([place]) -see above -f/u SLP  Sepsis ([place]) -see above  COPD (chronic obstructive pulmonary disease) (HCC) No wheezing and rhonchi. -bronchodilators  CAD (coronary artery disease) Trop 8 --> 8. -ASA, Lipitor  Chronic diastolic CHF (congestive heart failure) ([place]) 2d echo on 05/14/17 showed EF 60-65% with grade I diastolic dysFx. No leg edema. BNP 78.2. CHF is compensated. -watch volume status closely.   DM (diabetes mellitus) type II, controlled, with peripheral vascular disorder ([place]) Recent A1c 6.2, well controlled. Pt is taking metformin -SSI.  Essential hypertension -continue home amlodipine  -IV Hydralazine prn  Hyperlipidemia LDL goal <70 -lipitor  Major depressive disorder, recurrent, in remission ([place]) -continue home meds, cellexa  Obesity with body mass index (BMI) of 30.0 to 39.9  BMI= 32.87  and BW= 95.2 -Diet and exercise.   -Encourage to lose weight.     Consultants: None Procedures performed: None Disposition: Home Diet recommendation:  Discharge Diet Orders (From admission, onward)     Start     Ordered   08/07/22 0000  Diet - low sodium heart healthy        08/07/22 1225           Cardiac and Carb modified diet DISCHARGE MEDICATION: Allergies as of 08/07/2022       Reactions   Contrast Media [iodinated Contrast Media] Hives   Penicillins Swelling, Other (See Comments)   08/05/22 Per MD: pt had severe rash and facial swelling with PCN   Xarelto [rivaroxaban] Other (See Comments)   GI Bleed   Clindamycin/lincomycin Rash   Dye Fdc Red [red Dye] Rash   Lovenox [enoxaparin Sodium] Rash         Medication List     TAKE these medications    albuterol 108 (90 Base) MCG/ACT inhaler Commonly known as: VENTOLIN HFA Inhale 2 puffs into the lungs every 6 (six) hours as needed for wheezing or shortness of breath.   amLODipine 10 MG tablet Commonly known as: NORVASC TAKE 1 TABLET BY MOUTH DAILY   aspirin 81 MG chewable tablet Chew 81 mg by mouth daily.   atorvastatin 40 MG tablet Commonly known as: LIPITOR TAKE ONE TABLET BY MOUTH EVERY DAY   citalopram 10 MG tablet Commonly known as: CELEXA TAKE ONE TABLET BY MOUTH EVERY DAY   dextromethorphan-guaiFENesin 30-600 MG 12hr tablet Commonly known as: MUCINEX DM Take 1 tablet by mouth 2 (two) times daily as needed for cough.   dorzolamide-timolol 22.3-6.8 MG/ML ophthalmic solution Commonly known as: COSOPT 1 drop 2 (two) times daily.   fluticasone-salmeterol 250-50 MCG/ACT Aepb Commonly known as: ADVAIR INHALE 1 PUFF TWICE DAILY   latanoprost 0.005 % ophthalmic solution Commonly known as: XALATAN Place 1 drop into the left eye at bedtime.   levofloxacin 750 MG tablet Commonly known as: LEVAQUIN Take 1 tablet (750 mg total) by mouth daily for 2 doses. Start taking on: August 08, 2022  metFORMIN 500 MG 24 hr tablet Commonly known as: GLUCOPHAGE-XR TAKE 3 TABLETS BY MOUTH DAILY   metroNIDAZOLE 500 MG tablet Commonly known as: FLAGYL Take 1 tablet (500 mg total) by mouth every 12 (twelve) hours for 6 doses.   nitroGLYCERIN 0.4 MG SL tablet Commonly known as: NITROSTAT Place 1 tablet (0.4 mg total) under the tongue every 5 (five) minutes as needed for chest pain.   pantoprazole 40 MG tablet Commonly known as: PROTONIX Take 1 tablet (40 mg total) by mouth daily. Start taking on: August 08, 2022               Durable Medical Equipment  (From admission, onward)           Start     Ordered   08/07/22 1141  For home use only DME oxygen  Once       Question Answer Comment  Length of  Need 6 Months   Mode or (Route) Nasal cannula   Liters per Minute 2   Frequency Continuous (stationary and portable oxygen unit needed)   Oxygen conserving device Yes   Oxygen delivery system Gas      08/07/22 1140            Discharge Exam: Filed Weights   08/05/22 0555 08/05/22 1535 08/07/22 0600  Weight: 99.8 kg 95.2 kg 94 kg   General.  Well-developed elderly man, in no acute distress. Pulmonary.  Lungs clear bilaterally, normal respiratory effort. CV.  Regular rate and rhythm, no JVD, rub or murmur. Abdomen.  Soft, nontender, nondistended, BS positive. CNS.  Alert and oriented .  No focal neurologic deficit. Extremities.  No edema, no cyanosis, pulses intact and symmetrical. Psychiatry.  Judgment and insight appears normal.   Condition at discharge: stable  The results of significant diagnostics from this hospitalization (including imaging, microbiology, ancillary and laboratory) are listed below for reference.   Imaging Studies: CT Chest Wo Contrast  Result Date: 08/05/2022 CLINICAL DATA:  History of lung nodule and recent history of chest pain and radiographic and/or clinical diagnosis of pneumonia. EXAM: CT CHEST WITHOUT CONTRAST TECHNIQUE: Multidetector CT imaging of the chest was performed following the standard protocol without IV contrast. RADIATION DOSE REDUCTION: This exam was performed according to the departmental dose-optimization program which includes automated exposure control, adjustment of the mA and/or kV according to patient size and/or use of iterative reconstruction technique. COMPARISON:  August 05, 2022. FINDINGS: Cardiovascular: Calcified aortic atherosclerosis. No signs of aneurysmal dilation of the thoracic aorta. Normal heart size. No pericardial effusion. Three-vessel coronary artery disease is extensive. Central pulmonary vasculature is normal caliber. Limited assessment of cardiovascular structures given lack of intravenous contrast.  Mediastinum/Nodes: Thoracic inlet structures are normal. No axillary lymphadenopathy. No mediastinal lymphadenopathy. No hilar lymphadenopathy. Mildly prominent subcarinal nodal tissue measures 14 mm short axis. There is material in the esophagus which is mildly patulous. A appearance of this material is nearly stool-like and more focal in the distal esophagus implying stasis. Lungs/Pleura: Patchy areas of nodularity with surrounding ground-glass opacity in the RIGHT lower lobe and RIGHT middle lobe in particular are superimposed on background changes of pulmonary emphysema. Medial LEFT lung base also with less pronounced nodularity which is more typical of tree in bud opacities. There are small amounts of material within the bronchus intermedius. (Image 94/3) 7 mm RIGHT lower lobe pulmonary nodule. Tiny cavitary nodule on image 93/3 at 5 mm. (Image 86/3) 7 mm pulmonary nodule. More pronounced tree-in-bud type nodularity is present at  the periphery and in the RIGHT middle lobe is There is material within some bronchi and RIGHT middle lobe and RIGHT lower lobe. Upper Abdomen: Incidental imaging of upper abdominal contents shows no acute process. Post cholecystectomy. Small hiatal hernia. Musculoskeletal: No acute bone finding. No destructive bone process. Spinal degenerative changes. IMPRESSION: 1. Patchy areas of nodularity with surrounding ground-glass opacity in the RIGHT lower lobe and RIGHT middle lobe in particular are superimposed on background changes of pulmonary emphysema. Findings are most suggestive of multifocal pneumonia, perhaps related to aspiration. Discrete nodularity is present in addition to tree-in-bud opacities. Would include atypical infection including nontuberculous in tuberculous mycobacterial infection in the differential. Would also suggest blood cultures to exclude the possibility of an atypical presentation of septic emboli and include follow-up as outlined previously to ensure  resolution of the above process. 2. Subcarinal nodal enlargement new from previous imaging, perhaps reactive but would suggest attention on follow-up. 3. Ultimately pulmonary consultation may be helpful for further evaluation given persistence and or recurrence when compared to imaging from March. 4. Stool like material in the esophagus. Swallow evaluation and or dedicated esophageal evaluation may be helpful. More focal area in the distal esophagus does not allow for exclusion of distal esophageal neoplasm or stricture. 5. Three-vessel coronary artery disease. 6. Small hiatal hernia. Aortic Atherosclerosis (ICD10-I70.0) and Emphysema (ICD10-J43.9). Electronically Signed   By: Zetta Bills M.D.   On: 08/05/2022 09:02   DG Chest 2 View  Result Date: 08/05/2022 CLINICAL DATA:  Shortness of breath.  Productive sputum EXAM: CHEST - 2 VIEW COMPARISON:  02/22/2021 FINDINGS: Chronic lung disease with interstitial coarsening. Indistinct opacity at the right middle lobe. Normal heart size and mediastinal contours. IMPRESSION: 1. Right middle lobe pneumonia. Followup PA and lateral chest X-ray is recommended in 3-4 weeks following trial of antibiotic therapy to ensure resolution and exclude underlying malignancy, similar opacity was seen in this location 02/22/2021. 2. COPD Electronically Signed   By: Jorje Guild M.D.   On: 08/05/2022 07:11    Microbiology: Results for orders placed or performed during the hospital encounter of 08/05/22  SARS Coronavirus 2 by RT PCR (hospital order, performed in [facility] hospital lab) *cepheid single result test* Anterior Nasal Swab     Status: None   Collection Time: 08/05/22  6:04 AM   Specimen: Anterior Nasal Swab  Result Value Ref Range Status   SARS Coronavirus 2 by RT PCR NEGATIVE NEGATIVE Final    Comment: (NOTE) SARS-CoV-2 target nucleic acids are NOT DETECTED.  The SARS-CoV-2 RNA is generally detectable in upper and lower respiratory specimens during the  acute phase of infection. The lowest concentration of SARS-CoV-2 viral copies this assay can detect is 250 copies / mL. A negative result does not preclude SARS-CoV-2 infection and should not be used as the sole basis for treatment or other patient management decisions.  A negative result may occur with improper specimen collection / handling, submission of specimen other than nasopharyngeal swab, presence of viral mutation(s) within the areas targeted by this assay, and inadequate number of viral copies (<250 copies / mL). A negative result must be combined with clinical observations, patient history, and epidemiological information.  Fact Sheet for Patients:   https://www.patel.info/  Fact Sheet for Healthcare Providers: https://hall.com/  This test is not yet approved or  cleared by the [place] FDA and has been authorized for detection and/or diagnosis of SARS-CoV-2 by FDA under an Emergency Use Authorization (EUA).  This EUA will remain in  effect (meaning this test can be used) for the duration of the COVID-19 declaration under Section 564(b)(1) of the Act, 21 U.S.C. section 360bbb-3(b)(1), unless the authorization is terminated or revoked sooner.  Performed at [facility], [address] [place], [place] [address]   Culture, blood (routine x 2) Call MD if unable to obtain prior to antibiotics being given     Status: None (Preliminary result)   Collection Time: 08/05/22 10:51 AM   Specimen: BLOOD  Result Value Ref Range Status   Specimen Description BLOOD BLOOD LEFT ARM  Final   Special Requests   Final    BOTTLES DRAWN AEROBIC AND ANAEROBIC Blood Culture adequate volume   Culture   Final    NO GROWTH 2 DAYS Performed at [facility], [address] [place], [place] [address]    Report Status PENDING  Incomplete  Culture, blood (routine x 2) Call MD if unable to obtain prior to antibiotics being given      Status: None (Preliminary result)   Collection Time: 08/05/22 11:00 AM   Specimen: BLOOD  Result Value Ref Range Status   Specimen Description BLOOD BLOOD RIGHT ARM  Final   Special Requests   Final    BOTTLES DRAWN AEROBIC AND ANAEROBIC Blood Culture adequate volume   Culture   Final    NO GROWTH 2 DAYS Performed at [facility], [address] [place], [place] [address]    Report Status PENDING  Incomplete  Respiratory (~20 pathogens) panel by PCR     Status: None   Collection Time: 08/05/22  2:30 PM   Specimen: Nasopharyngeal Swab; Respiratory  Result Value Ref Range Status   Adenovirus NOT DETECTED NOT DETECTED Final   Coronavirus 229E NOT DETECTED NOT DETECTED Final    Comment: (NOTE) The Coronavirus on the Respiratory Panel, DOES NOT test for the novel  Coronavirus (2019 nCoV)    Coronavirus HKU1 NOT DETECTED NOT DETECTED Final   Coronavirus NL63 NOT DETECTED NOT DETECTED Final   Coronavirus OC43 NOT DETECTED NOT DETECTED Final   Metapneumovirus NOT DETECTED NOT DETECTED Final   Rhinovirus / Enterovirus NOT DETECTED NOT DETECTED Final   Influenza A NOT DETECTED NOT DETECTED Final   Influenza B NOT DETECTED NOT DETECTED Final   Parainfluenza Virus 1 NOT DETECTED NOT DETECTED Final   Parainfluenza Virus 2 NOT DETECTED NOT DETECTED Final   Parainfluenza Virus 3 NOT DETECTED NOT DETECTED Final   Parainfluenza Virus 4 NOT DETECTED NOT DETECTED Final   Respiratory Syncytial Virus NOT DETECTED NOT DETECTED Final   Bordetella pertussis NOT DETECTED NOT DETECTED Final   Bordetella Parapertussis NOT DETECTED NOT DETECTED Final   Chlamydophila pneumoniae NOT DETECTED NOT DETECTED Final   Mycoplasma pneumoniae NOT DETECTED NOT DETECTED Final    Comment: Performed at [facility], [place] [address] [place], [place] [address]    Labs: CBC: Recent Labs  Lab 08/05/22 1050 08/06/22 0424  WBC 12.5* 11.4*  NEUTROABS 12.5*  --   HGB 17.0 14.5  HCT 49.3 42.7  MCV  83.3 87.3  PLT 162 921*   Basic Metabolic Panel: Recent Labs  Lab 08/05/22 0604  NA 140  K 4.3  CL 111  CO2 20*  GLUCOSE 161*  BUN 15  CREATININE 1.01  CALCIUM 9.4   Liver Function Tests: Recent Labs  Lab 08/05/22 0604  AST 17  ALT 15  ALKPHOS 126  BILITOT 1.5*  PROT 8.0  ALBUMIN 4.7   CBG:  Recent Labs  Lab 08/06/22 1125 08/06/22 1611 08/06/22 2120 08/07/22 0754 08/07/22 1209  GLUCAP 205* 151* 174* 175* 171*    Discharge time spent: greater than 30 minutes.  This record has been created using Systems analyst. Errors have been sought and corrected,but may not always be located. Such creation errors do not reflect on the standard of care.   Signed: Lorella Nimrod, MD [facility] 08/07/2022

## 2022-08-07 NOTE — Progress Notes (Signed)
Pulse oximetry on room air is 90, walking on RA sats went to 83-84% post 1 min, back to room sitting for 3 minutes sats at 85%, patient placed back on 2 L O2 sats re-checked and up to 95%.

## 2022-08-07 NOTE — Plan of Care (Signed)
Problem: Education: Goal: Knowledge of General Education information will improve Description: Including pain rating scale, medication(s)/side effects and non-pharmacologic comfort measures 08/07/2022 1407 by Jordan Likes, RN Outcome: Adequate for Discharge 08/07/2022 1143 by Jordan Likes, RN Outcome: Progressing   Problem: Health Behavior/Discharge Planning: Goal: Ability to manage health-related needs will improve 08/07/2022 1407 by Jordan Likes, RN Outcome: Adequate for Discharge 08/07/2022 1143 by Jordan Likes, RN Outcome: Progressing   Problem: Clinical Measurements: Goal: Ability to maintain clinical measurements within normal limits will improve 08/07/2022 1407 by Jordan Likes, RN Outcome: Adequate for Discharge 08/07/2022 1143 by Jordan Likes, RN Outcome: Progressing Goal: Will remain free from infection 08/07/2022 1407 by Jordan Likes, RN Outcome: Adequate for Discharge 08/07/2022 1143 by Jordan Likes, RN Outcome: Progressing Goal: Diagnostic test results will improve 08/07/2022 1407 by Jordan Likes, RN Outcome: Adequate for Discharge 08/07/2022 1143 by Jordan Likes, RN Outcome: Progressing Goal: Respiratory complications will improve 08/07/2022 1407 by Jordan Likes, RN Outcome: Adequate for Discharge 08/07/2022 1143 by Jordan Likes, RN Outcome: Progressing Goal: Cardiovascular complication will be avoided 08/07/2022 1407 by Jordan Likes, RN Outcome: Adequate for Discharge 08/07/2022 1143 by Jordan Likes, RN Outcome: Progressing   Problem: Activity: Goal: Risk for activity intolerance will decrease 08/07/2022 1407 by Jordan Likes, RN Outcome: Adequate for Discharge 08/07/2022 1143 by Jordan Likes, RN Outcome: Progressing   Problem: Nutrition: Goal: Adequate nutrition will be maintained 08/07/2022 1407 by Jordan Likes, RN Outcome: Adequate for Discharge 08/07/2022 1143 by Jordan Likes, RN Outcome: Progressing   Problem: Coping: Goal: Level of anxiety will decrease 08/07/2022 1407 by Jordan Likes, RN Outcome: Adequate for Discharge 08/07/2022 1143 by Jordan Likes, RN Outcome: Progressing   Problem: Elimination: Goal: Will not experience complications related to bowel motility 08/07/2022 1407 by Jordan Likes, RN Outcome: Adequate for Discharge 08/07/2022 1143 by Jordan Likes, RN Outcome: Progressing Goal: Will not experience complications related to urinary retention 08/07/2022 1407 by Jordan Likes, RN Outcome: Adequate for Discharge 08/07/2022 1143 by Jordan Likes, RN Outcome: Progressing   Problem: Pain Managment: Goal: General experience of comfort will improve 08/07/2022 1407 by Jordan Likes, RN Outcome: Adequate for Discharge 08/07/2022 1143 by Jordan Likes, RN Outcome: Progressing   Problem: Safety: Goal: Ability to remain free from injury will improve 08/07/2022 1407 by Jordan Likes, RN Outcome: Adequate for Discharge 08/07/2022 1143 by Jordan Likes, RN Outcome: Progressing   Problem: Skin Integrity: Goal: Risk for impaired skin integrity will decrease 08/07/2022 1407 by Jordan Likes, RN Outcome: Adequate for Discharge 08/07/2022 1143 by Jordan Likes, RN Outcome: Progressing   Problem: Education: Goal: Knowledge of disease or condition will improve 08/07/2022 1407 by Jordan Likes, RN Outcome: Adequate for Discharge 08/07/2022 1143 by Jordan Likes, RN Outcome: Progressing Goal: Knowledge of the prescribed therapeutic regimen will improve 08/07/2022 1407 by Jordan Likes, RN Outcome: Adequate for Discharge 08/07/2022 1143 by Jordan Likes, RN Outcome: Progressing Goal: Individualized Educational Video(s) 08/07/2022 1407 by Jordan Likes, RN Outcome: Adequate for Discharge 08/07/2022 1143 by Jordan Likes, RN Outcome: Progressing   Problem: Activity: Goal: Ability  to tolerate increased activity will improve 08/07/2022 1407 by Jordan Likes, RN Outcome: Adequate for Discharge 08/07/2022 1143 by Jordan Likes, RN Outcome: Progressing Goal: Will verbalize the importance of balancing activity with adequate rest periods 08/07/2022 1407 by Ouida Sills,  Lydia Guiles, RN Outcome: Adequate for Discharge 08/07/2022 1143 by Jordan Likes, RN Outcome: Progressing   Problem: Respiratory: Goal: Ability to maintain a clear airway will improve 08/07/2022 1407 by Jordan Likes, RN Outcome: Adequate for Discharge 08/07/2022 1143 by Jordan Likes, RN Outcome: Progressing Goal: Levels of oxygenation will improve 08/07/2022 1407 by Jordan Likes, RN Outcome: Adequate for Discharge 08/07/2022 1143 by Jordan Likes, RN Outcome: Not Met (add Reason) Note: Patient desats with ambulation to the 80's on RA, provider notified  Goal: Ability to maintain adequate ventilation will improve 08/07/2022 1407 by Jordan Likes, RN Outcome: Adequate for Discharge 08/07/2022 1143 by Jordan Likes, RN Outcome: Progressing   Problem: Activity: Goal: Ability to tolerate increased activity will improve 08/07/2022 1407 by Jordan Likes, RN Outcome: Adequate for Discharge 08/07/2022 1143 by Jordan Likes, RN Outcome: Progressing   Problem: Clinical Measurements: Goal: Ability to maintain a body temperature in the normal range will improve 08/07/2022 1407 by Jordan Likes, RN Outcome: Adequate for Discharge 08/07/2022 1143 by Jordan Likes, RN Outcome: Progressing   Problem: Respiratory: Goal: Ability to maintain adequate ventilation will improve 08/07/2022 1407 by Jordan Likes, RN Outcome: Adequate for Discharge 08/07/2022 1143 by Jordan Likes, RN Outcome: Progressing Goal: Ability to maintain a clear airway will improve 08/07/2022 1407 by Jordan Likes, RN Outcome: Adequate for Discharge 08/07/2022 1143 by Jordan Likes,  RN Outcome: Progressing   Problem: Education: Goal: Ability to describe self-care measures that may prevent or decrease complications (Diabetes Survival Skills Education) will improve 08/07/2022 1407 by Jordan Likes, RN Outcome: Adequate for Discharge 08/07/2022 1143 by Jordan Likes, RN Outcome: Progressing Goal: Individualized Educational Video(s) 08/07/2022 1407 by Jordan Likes, RN Outcome: Adequate for Discharge 08/07/2022 1143 by Jordan Likes, RN Outcome: Progressing   Problem: Coping: Goal: Ability to adjust to condition or change in health will improve 08/07/2022 1407 by Jordan Likes, RN Outcome: Adequate for Discharge 08/07/2022 1143 by Jordan Likes, RN Outcome: Progressing   Problem: Fluid Volume: Goal: Ability to maintain a balanced intake and output will improve 08/07/2022 1407 by Jordan Likes, RN Outcome: Adequate for Discharge 08/07/2022 1143 by Jordan Likes, RN Outcome: Progressing   Problem: Health Behavior/Discharge Planning: Goal: Ability to identify and utilize available resources and services will improve 08/07/2022 1407 by Jordan Likes, RN Outcome: Adequate for Discharge 08/07/2022 1143 by Jordan Likes, RN Outcome: Progressing Goal: Ability to manage health-related needs will improve 08/07/2022 1407 by Jordan Likes, RN Outcome: Adequate for Discharge 08/07/2022 1143 by Jordan Likes, RN Outcome: Progressing   Problem: Metabolic: Goal: Ability to maintain appropriate glucose levels will improve 08/07/2022 1407 by Jordan Likes, RN Outcome: Adequate for Discharge 08/07/2022 1143 by Jordan Likes, RN Outcome: Progressing   Problem: Nutritional: Goal: Maintenance of adequate nutrition will improve 08/07/2022 1407 by Jordan Likes, RN Outcome: Adequate for Discharge 08/07/2022 1143 by Jordan Likes, RN Outcome: Progressing Goal: Progress toward achieving an optimal weight will  improve 08/07/2022 1407 by Jordan Likes, RN Outcome: Adequate for Discharge 08/07/2022 1143 by Jordan Likes, RN Outcome: Progressing   Problem: Skin Integrity: Goal: Risk for impaired skin integrity will decrease 08/07/2022 1407 by Jordan Likes, RN Outcome: Adequate for Discharge 08/07/2022 1143 by Jordan Likes, RN Outcome: Progressing   Problem: Tissue Perfusion: Goal: Adequacy of tissue perfusion will improve 08/07/2022 1407 by Jordan Likes,  RN Outcome: Adequate for Discharge 08/07/2022 1143 by Jordan Likes, RN Outcome: Progressing

## 2022-08-07 NOTE — Progress Notes (Signed)
All patient belongings gathered. Discharge instruction given to patient, IV removed. Patient still waiting for O2 to be delivered bedside.

## 2022-08-07 NOTE — TOC Initial Note (Signed)
Transition of Care [facility]) - Initial/Assessment Note    Patient Details  Name: [name] MRN: [id number] Date of Birth: Jan 11, 1946  [facility] CM/SW Contact:    Beverly Sessions, RN Phone Number: 08/07/2022, 11:52 AM  Clinical Narrative:                  Admitted for: PNA Admitted from: Home with wife [id number] Current home health/prior home health/DME: NA     Wife to transport at discharge Patient will require O2 at discharge.  Referral made to [facility] with [employer].  Portable to be delivered to bedside prior to dc.   Patient declines home health services at DC   Patient Goals and CMS Choice        Expected Discharge Plan and Services                                                Prior Living Arrangements/Services                       Activities of Daily Living Home Assistive Devices/Equipment: Eyeglasses, Dentures (specify type) ADL Screening (condition at time of admission) Patient's cognitive ability adequate to safely complete daily activities?: Yes Is the patient deaf or have difficulty hearing?: No Does the patient have difficulty seeing, even when wearing glasses/contacts?: No Does the patient have difficulty concentrating, remembering, or making decisions?: No Patient able to express need for assistance with ADLs?: Yes Does the patient have difficulty dressing or bathing?: No Independently performs ADLs?: Yes (appropriate for developmental age) Does the patient have difficulty walking or climbing stairs?: No Weakness of Legs: None Weakness of Arms/Hands: None  Permission Sought/Granted                  Emotional Assessment              Admission diagnosis:  COPD exacerbation ([place]) [J44.1] CAP (community acquired pneumonia) [J18.9] Acute respiratory failure with hypoxia ([place]) [J96.01] Pneumonia of right middle lobe due to infectious organism [J18.9] Patient Active Problem List   Diagnosis Date Noted    Multifocal pneumonia 08/05/2022   Sepsis ([place]) 08/05/2022   Aspiration pneumonia ([place]) 08/05/2022   Obesity with body mass index (BMI) of 30.0 to 39.9    Chronic diastolic CHF (congestive heart failure) ([place]) 02/22/2021   COPD exacerbation ([place]) 02/22/2021   OSA (obstructive sleep apnea) 09/15/2017   Coronary artery disease of native artery of native heart with stable angina pectoris ([place]) 08/18/2017   COPD (chronic obstructive pulmonary disease) ([place]) 05/14/2017   Centrilobular emphysema ([place]) 09/12/2014   Aortic transection    Peripheral vascular disease ([place])    History of MI (myocardial infarction)    Duodenal ulcer with hemorrhage 04/30/2011   DM (diabetes mellitus) type II, controlled, with peripheral vascular disorder ([place]) 02/13/2010   Hyperlipidemia LDL goal <70 08/16/2008   ERECTILE DYSFUNCTION 08/16/2008   Major depressive disorder, recurrent, in remission ([place]) 08/16/2008   Essential hypertension 08/16/2008   CAD (coronary artery disease) 1992   PCP:  Pcp, No Pharmacy:   [place], [place] - [address] [place] [address] Phone: [phone] Fax: [phone]     Social Determinants of Health (SDOH) Interventions    Readmission Risk Interventions     No data to  display

## 2022-08-07 NOTE — Plan of Care (Signed)
Pt. Axox4,RA, ambulates independently. Continent B&B, takes meds whole. On 2LPM O2 taken off by MD today and pt desat with ambulation and post at rest had to re-apply, refer to note.  Problem: Education: Goal: Knowledge of General Education information will improve Description: Including pain rating scale, medication(s)/side effects and non-pharmacologic comfort measures Outcome: Progressing   Problem: Health Behavior/Discharge Planning: Goal: Ability to manage health-related needs will improve Outcome: Progressing   Problem: Clinical Measurements: Goal: Ability to maintain clinical measurements within normal limits will improve Outcome: Progressing Goal: Will remain free from infection Outcome: Progressing Goal: Diagnostic test results will improve Outcome: Progressing Goal: Respiratory complications will improve Outcome: Progressing Goal: Cardiovascular complication will be avoided Outcome: Progressing   Problem: Activity: Goal: Risk for activity intolerance will decrease Outcome: Progressing   Problem: Nutrition: Goal: Adequate nutrition will be maintained Outcome: Progressing   Problem: Coping: Goal: Level of anxiety will decrease Outcome: Progressing   Problem: Elimination: Goal: Will not experience complications related to bowel motility Outcome: Progressing Goal: Will not experience complications related to urinary retention Outcome: Progressing   Problem: Pain Managment: Goal: General experience of comfort will improve Outcome: Progressing   Problem: Safety: Goal: Ability to remain free from injury will improve Outcome: Progressing   Problem: Skin Integrity: Goal: Risk for impaired skin integrity will decrease Outcome: Progressing   Problem: Education: Goal: Knowledge of disease or condition will improve Outcome: Progressing Goal: Knowledge of the prescribed therapeutic regimen will improve Outcome: Progressing Goal: Individualized Educational  Video(s) Outcome: Progressing   Problem: Activity: Goal: Ability to tolerate increased activity will improve Outcome: Progressing Goal: Will verbalize the importance of balancing activity with adequate rest periods Outcome: Progressing   Problem: Respiratory: Goal: Ability to maintain a clear airway will improve Outcome: Progressing Goal: Levels of oxygenation will improve Outcome: Not Met (add Reason) Note: Patient desats with ambulation to the 80's on RA, provider notified  Goal: Ability to maintain adequate ventilation will improve Outcome: Progressing   Problem: Activity: Goal: Ability to tolerate increased activity will improve Outcome: Progressing   Problem: Clinical Measurements: Goal: Ability to maintain a body temperature in the normal range will improve Outcome: Progressing   Problem: Respiratory: Goal: Ability to maintain adequate ventilation will improve Outcome: Progressing Goal: Ability to maintain a clear airway will improve Outcome: Progressing   Problem: Education: Goal: Ability to describe self-care measures that may prevent or decrease complications (Diabetes Survival Skills Education) will improve Outcome: Progressing Goal: Individualized Educational Video(s) Outcome: Progressing   Problem: Coping: Goal: Ability to adjust to condition or change in health will improve Outcome: Progressing   Problem: Fluid Volume: Goal: Ability to maintain a balanced intake and output will improve Outcome: Progressing   Problem: Health Behavior/Discharge Planning: Goal: Ability to identify and utilize available resources and services will improve Outcome: Progressing Goal: Ability to manage health-related needs will improve Outcome: Progressing   Problem: Metabolic: Goal: Ability to maintain appropriate glucose levels will improve Outcome: Progressing   Problem: Metabolic: Goal: Ability to maintain appropriate glucose levels will improve Outcome:  Progressing   Problem: Nutritional: Goal: Maintenance of adequate nutrition will improve Outcome: Progressing Goal: Progress toward achieving an optimal weight will improve Outcome: Progressing   Problem: Skin Integrity: Goal: Risk for impaired skin integrity will decrease Outcome: Progressing   Problem: Tissue Perfusion: Goal: Adequacy of tissue perfusion will improve Outcome: Progressing

## 2022-08-10 ENCOUNTER — Telehealth: Payer: Self-pay | Admitting: *Deleted

## 2022-08-10 ENCOUNTER — Telehealth: Payer: Self-pay | Admitting: Gastroenterology

## 2022-08-10 LAB — CULTURE, BLOOD (ROUTINE X 2)
Culture: NO GROWTH
Culture: NO GROWTH
Special Requests: ADEQUATE
Special Requests: ADEQUATE

## 2022-08-10 NOTE — Patient Outreach (Signed)
  Care Coordination [facility] Note Transition Care Management Unsuccessful Follow-up Telephone Call  Date of discharge and from where:  [facility] [phone]  Attempts:  1st Attempt  Reason for unsuccessful TCM follow-up call:  Left voice message  [place] Management [phone]

## 2022-08-10 NOTE — Telephone Encounter (Signed)
Patients son called and stated that the patient was seen in the emergency room on 08/05/2022 and they recommended that the patient make an appointment with Dr Allen Norris for gerd. I do not see anything in the discharge summary but I could be looking in the wrong spot. Patients son is requesting a call back to schedule an appointment.

## 2022-08-10 NOTE — Patient Outreach (Signed)
  Care Coordination [facility] Note Transition Care Management Unsuccessful Follow-up Telephone Call  Date of discharge and from where:  [facility] [phone]  Attempts:  2nd Attempt  Reason for unsuccessful TCM follow-up call:  Left voice message  [place] Management [phone]

## 2022-08-11 ENCOUNTER — Telehealth: Payer: Self-pay | Admitting: *Deleted

## 2022-08-11 NOTE — Patient Outreach (Signed)
  Care Coordination [facility] Note Transition Care Management Follow-up Telephone Call Date of discharge and from where: [facility] [phone] How have you been since you were released from the hospital? Doing OK Any questions or concerns? No  Items Reviewed: Did the pt receive and understand the discharge instructions provided? Yes  Medications obtained and verified? Yes  Other? No  Any new allergies since your discharge? No  Dietary orders reviewed? No Do you have support at home? Yes   Home Care and Equipment/Supplies: Were home health services ordered? no If so, what is the name of the agency? N  Has the agency set up a time to come to the patient's home? no Were any new equipment or medical supplies ordered?  No What is the name of the medical supply agency? N  Were you able to get the supplies/equipment? not applicable Do you have any questions related to the use of the equipment or supplies? No  Functional Questionnaire: (I = Independent and D = Dependent) ADLs: I  Bathing/Dressing- I  Meal Prep- D  Eating- I  Maintaining continence- I  Transferring/Ambulation- I  Managing Meds- I  Follow up appointments reviewed:  PCP Hospital f/u appt confirmed? No  Patient refused RN to assist with follow up appointments Specialist Hospital f/u appt confirmed? No   Are transportation arrangements needed? No  If their condition worsens, is the pt aware to call PCP or go to the Emergency Dept.? Yes Was the patient provided with contact information for the PCP's office or ED? Yes Was to pt encouraged to call back with questions or concerns? Yes  SDOH assessments and interventions completed:   Yes  Care Coordination Interventions Activated:  Yes   Care Coordination Interventions:   N/A     Encounter Outcome:  Pt. Visit Completed    [place] Management [phone]

## 2022-08-12 ENCOUNTER — Telehealth: Payer: Self-pay

## 2022-08-12 ENCOUNTER — Telehealth: Payer: Self-pay | Admitting: *Deleted

## 2022-08-12 NOTE — Progress Notes (Signed)
    Chronic Care Management Pharmacy Assistant   Name: ZACKRY DEINES  MRN: [id number] DOB: February 16, 1946  Reason for Encounter: Non-CCM (Hosptial Follow Up)  Medications: Outpatient Encounter Medications as of 08/12/2022  Medication Sig   albuterol (VENTOLIN HFA) 108 (90 Base) MCG/ACT inhaler Inhale 2 puffs into the lungs every 6 (six) hours as needed for wheezing or shortness of breath.   amLODipine (NORVASC) 10 MG tablet TAKE 1 TABLET BY MOUTH DAILY   aspirin 81 MG chewable tablet Chew 81 mg by mouth daily.   atorvastatin (LIPITOR) 40 MG tablet TAKE ONE TABLET BY MOUTH EVERY DAY   citalopram (CELEXA) 10 MG tablet TAKE ONE TABLET BY MOUTH EVERY DAY   dextromethorphan-guaiFENesin (MUCINEX DM) 30-600 MG 12hr tablet Take 1 tablet by mouth 2 (two) times daily as needed for cough.   dorzolamide-timolol (COSOPT) 22.3-6.8 MG/ML ophthalmic solution 1 drop 2 (two) times daily.   fluticasone-salmeterol (ADVAIR) 250-50 MCG/ACT AEPB INHALE 1 PUFF TWICE DAILY   latanoprost (XALATAN) 0.005 % ophthalmic solution Place 1 drop into the left eye at bedtime.   metFORMIN (GLUCOPHAGE-XR) 500 MG 24 hr tablet TAKE 3 TABLETS BY MOUTH DAILY   nitroGLYCERIN (NITROSTAT) 0.4 MG SL tablet Place 1 tablet (0.4 mg total) under the tongue every 5 (five) minutes as needed for chest pain.   pantoprazole (PROTONIX) 40 MG tablet Take 1 tablet (40 mg total) by mouth daily.   No facility-administered encounter medications on file as of 08/12/2022.   Reviewed hospital notes for details of recent visit. Patient has been contacted by Transitions of Care team: Yes  Admitted to the hospital on 08/05/22. Discharge date was 08/07/22.  Discharged from [facility].   Discharge diagnosis (Principal Problem): Multifocal pneumonia Patient was discharged to Home  Brief summary of hospital course: MACINTYRE ALEXA is a 76 y.o. male with medical history significant of hypertension, hyperlipidemia, diabetes mellitus, COPD,  depression, GI bleeding, dCHF, CAD, aortic transection (s/p for repair), OSA not using CPAP currently, PVD, who presents with progressively worsening shortness breath and cough for 2 days.  Patient has productive cough with yellow-colored sputum production.  No fever or chills.  Patient had some mild frontal chest pain initially, which has subsided.  No nausea vomiting, diarrhea or abdominal pain.  No symptoms of UTI.  Patient is not using oxygen normally at home.  Patient was found to have 1 episode of oxygen desaturation to 86% on room air, which improved to 92-94% on room air later.  No acute respiratory distress. He is being discharged on 2 L of oxygen.  History of underlying COPD, his PCP can determine the further need for continuation of oxygen once pneumonia resolves.  New?Medications Started at [facility] Discharge:?? -Started dextromethorphan-guaiFENesin [facility] DM) 30-600 MG 12hr tablet -Started levofloxacin (LEVAQUIN) 750 MG tablet -Started metroNIDAZOLE (FLAGYL) 500 MG tablet -Started pantoprazole (PROTONIX) 40 MG tablet  Medication Changes at Hospital Discharge: No changes  Medications Discontinued at Hospital Discharge: No changes  Medications that remain the same after Hospital Discharge:??  -All other medications will remain the same.    Next CCM appt: Non CCM  Other upcoming appts: Pulmonology appointment with on 09/02/2022 for [facility] appointment with on 10/07/2022  Charlene Brooke, PharmD notified and will determine if action is needed.  Charlene Brooke, CPP notified  Marijean Niemann, [place] Clinical Pharmacy Assistant [phone]

## 2022-08-12 NOTE — Patient Outreach (Signed)
  Care Coordination   Follow Up Visit Note   08/12/2022 Name: [name] MRN: [id number] DOB: 1946-03-26  [name] is a 76 y.o. year old male who sees Copland, Frederico Hamman, MD for primary care. I spoke with  Mrs Pezzullo by phone today.  What matters to the patients health and wellness today?  She has concerns with the patient oxygen tanks. She wants to walk him outside and was not shown how to switch the tank over to the portable tank.    Goals Addressed   None        Care Coordination Interventions Activated:  Yes  Care Coordination Interventions:  Yes, provided RN gave patient the number for [employer] to troubleshoot switching tanks.   Follow up plan: No further intervention required.   Encounter Outcome:  Pt. Visit Completed   [place] Management [phone]

## 2022-08-12 NOTE — Telephone Encounter (Signed)
Patient son called back to see if we have got the referral taken care of. Informed patient yes because I looked in the discharge summary and it did say to follow up with GI. Made appointment for 11/04/2022

## 2022-08-13 DIAGNOSIS — I5032 Chronic diastolic (congestive) heart failure: Secondary | ICD-10-CM | POA: Diagnosis not present

## 2022-08-13 DIAGNOSIS — J690 Pneumonitis due to inhalation of food and vomit: Secondary | ICD-10-CM | POA: Diagnosis not present

## 2022-08-13 DIAGNOSIS — J441 Chronic obstructive pulmonary disease with (acute) exacerbation: Secondary | ICD-10-CM | POA: Diagnosis not present

## 2022-08-13 DIAGNOSIS — J189 Pneumonia, unspecified organism: Secondary | ICD-10-CM | POA: Diagnosis not present

## 2022-08-17 ENCOUNTER — Encounter: Payer: Self-pay | Admitting: Family Medicine

## 2022-08-17 ENCOUNTER — Ambulatory Visit (INDEPENDENT_AMBULATORY_CARE_PROVIDER_SITE_OTHER): Payer: PPO | Admitting: Family Medicine

## 2022-08-17 VITALS — BP 124/60 | HR 71 | Temp 98.2°F | Ht 68.0 in | Wt 207.1 lb

## 2022-08-17 DIAGNOSIS — E1151 Type 2 diabetes mellitus with diabetic peripheral angiopathy without gangrene: Secondary | ICD-10-CM

## 2022-08-17 DIAGNOSIS — I10 Essential (primary) hypertension: Secondary | ICD-10-CM | POA: Diagnosis not present

## 2022-08-17 DIAGNOSIS — E785 Hyperlipidemia, unspecified: Secondary | ICD-10-CM

## 2022-08-17 DIAGNOSIS — F0280 Dementia in other diseases classified elsewhere without behavioral disturbance: Secondary | ICD-10-CM | POA: Diagnosis not present

## 2022-08-17 DIAGNOSIS — R9389 Abnormal findings on diagnostic imaging of other specified body structures: Secondary | ICD-10-CM | POA: Diagnosis not present

## 2022-08-17 DIAGNOSIS — G3184 Mild cognitive impairment, so stated: Secondary | ICD-10-CM

## 2022-08-17 DIAGNOSIS — I25118 Atherosclerotic heart disease of native coronary artery with other forms of angina pectoris: Secondary | ICD-10-CM | POA: Diagnosis not present

## 2022-08-17 DIAGNOSIS — J432 Centrilobular emphysema: Secondary | ICD-10-CM

## 2022-08-17 DIAGNOSIS — J188 Other pneumonia, unspecified organism: Secondary | ICD-10-CM

## 2022-08-17 DIAGNOSIS — J189 Pneumonia, unspecified organism: Secondary | ICD-10-CM

## 2022-08-17 MED ORDER — DONEPEZIL HCL 5 MG PO TABS: 5.0000 mg | ORAL_TABLET | Freq: Every day | ORAL | 1 refills | Status: DC

## 2022-08-17 MED ORDER — PREDNISONE 20 MG PO TABS: ORAL_TABLET | ORAL | 0 refills | Status: DC

## 2022-08-17 MED ORDER — TIOTROPIUM BROMIDE MONOHYDRATE 18 MCG IN CAPS: 18.0000 ug | ORAL_CAPSULE | Freq: Every day | RESPIRATORY_TRACT | 12 refills | Status: DC

## 2022-08-17 NOTE — Patient Instructions (Signed)
You will need to get a follow-up chest x-ray, but it is too early.  The timing will be right when you have your follow-up with pulmonology.

## 2022-08-17 NOTE — Progress Notes (Signed)
Colton Eisenberger T. Shemica Meath, MD, [name] [place], [address]  Phone: [phone]  FAX: [phone]  [name] - 76 y.o. male  MRN [id number]  Date of Birth: 10/25/1946  Date: 08/17/2022  PCP: [name], MD  Referral: [name], MD  Chief Complaint  Patient presents with   Hospitalization Follow-up    PNA/Acute respiratory failure   Subjective:   [name] is a 76 y.o. very pleasant male patient who presents with the following:  Admit date:     08/05/2022  Discharge date: 08/07/22   Follow-up BMP and CBC. Repeat chest x-ray.  Outpatient GI referral, abnormality on esophagus on CT scan.  What is described as stool like material in the esophagus.  Radiology recommended swallow evaluation or esophageal evaluation by GI.  He did have a swallow evaluation in the hospital, which was normal  Was admitted with a multifocal pneumonia.  He had been having some progressive shortness of breath and coughing for 2 days with a productive cough.  At discharge he did require 2 L of oxygen, becoming hypoxic in the mid 80s with ambulation.  We will need to assess needs for future oxygen based on assessment and how he looks today in the office.,  And he is still requiring oxygen even at baseline, let alone standing and walking.  Family worried some about him mentallly.   Worked pretty hard all of the time, stopped thinking and asking strange questions in the last year.   He is still fairly short of breath and having some difficulty since leaving the hospital.  He is not sure how much better he is.  He did complete a 5-day course of Levaquin.  He currently did denies chest pain  Advair  Albuterol  - spiriva - prednisone - f/u pulmonology  What is the (year) (season) (date) (day) (month) 3 /5  What is the (state) (country) (town) (hospital) (floor) 5/5  Remember [place], [place], [place] - repeat them  back to me 3/3  Spell W-O-R-L-D backwards?  5/5  What are the 3 objects I gave you 1/3  Language  Name pen - watch 2/2  Repeat No ifs, ands or buts? 1/1  Take a paper in your hand, fold it in half and put it on the floor.  3/3  Read and obey the following:  CLOSE YOUR EYES  1/1  Write a sentence 1/1  Copy the design 1/1  Total 26/30     Review of Systems is noted in the HPI, as appropriate  Patient Active Problem List   Diagnosis Date Noted   Chronic diastolic CHF (congestive heart failure) ([place]) 02/22/2021    Priority: High   Coronary artery disease of native artery of native heart with stable angina pectoris ([place]) 08/18/2017    Priority: High   Centrilobular emphysema ([place]) 09/12/2014    Priority: High   History of MI (myocardial infarction)     Priority: High   Dementia 08/17/2022    Priority: Medium    OSA (obstructive sleep apnea) 09/15/2017    Priority: Medium    DM (diabetes mellitus) type II, controlled, with peripheral vascular disorder ([place]) 02/13/2010    Priority: Medium    Hyperlipidemia LDL goal <70 08/16/2008    Priority: Medium    Essential hypertension 08/16/2008    Priority: 1.   Multifocal pneumonia 08/05/2022   Aortic transection    Peripheral vascular disease ([place])  Duodenal ulcer with hemorrhage 04/30/2011   Major depressive disorder, recurrent, in remission ([place]) 08/16/2008   CAD (coronary artery disease) 1992    Past Medical History:  Diagnosis Date   Aortic transection 2005   Traumatic after a fall from a second floor. s/p repair at [facility]   Basal cell carcinoma    CAD (coronary artery disease) 1992   a. 1992 Acute anterior MI, thrombolytic therapy-->cath reportedly w/o significant CAD; b. 08/2016 MV: EF 57%, hypertensive respons, no ischemia/infarct; c. 04/2017 CTA chest w/ coronary Ca2+.   Depression    Diastolic dysfunction    a. 04/2017 Echo: EF 60-65%, Gr1 DD, nl RV fxn.   Erectile dysfunction    Fall 2005   fell off house:  torn aorta, clavicle fracture, rib fracture, vertebral fractures, lung contusion, coma x 2 weeks   Fracture of femoral neck, left ([place]) 08/09/2014   ORIF, Dr. Marry Guan   Fracture of radial neck, left, closed 08/09/2014   Gallstone pancreatitis    Gastric ulcer    History of ATN 08/09/2014   [facility], 2 days of dialysis (ARF)   Hyperlipidemia    Statin with joint pain    Hypertension    Obesity    Peripheral vascular disease ([place])    Atherosclerotic:R renal artery stenosis   Type II diabetes mellitus ([place])    Upper GI bleed    a. 04/2017 admit w/ melena and HGB down to 7 req prbc's; b. 04/2017 EGD: small HH, non-bleeding gastric & duod ulcers, non-bleeding erosive gastropathy-->PPI Rx.    Past Surgical History:  Procedure Laterality Date   CARDIAC CATHETERIZATION  08/1991   50 % mid-[place]ad stenosis with clot, 25-505 second marginal   CARDIOVASCULAR SURGERY     with ruptured Aorta, Dr. Camila Li, [place]     ESOPHAGOGASTRODUODENOSCOPY (EGD) WITH PROPOFOL N/A 05/03/2017   Procedure: ESOPHAGOGASTRODUODENOSCOPY (EGD) WITH PROPOFOL;  Surgeon: Lucilla Lame, MD;  Location: [facility] ENDOSCOPY;  Service: Endoscopy;  Laterality: N/A;   KNEE SURGERY     Right    ORIF HIP FRACTURE Left 08/12/2014   Dr. Marry Guan   THORACOTOMY     thoracic aorta repair    TRACHEOSTOMY     s/p reversal   VIDEO BRONCHOSCOPY Bilateral 05/27/2017   Procedure: VIDEO BRONCHOSCOPY WITHOUT FLUORO;  Surgeon: Juanito Doom, MD;  Location: [facility] ENDOSCOPY;  Service: Cardiopulmonary;  Laterality: Bilateral;    Family History  Problem Relation Age of Onset   Dementia Mother 24   Heart attack Father 63   Diabetes Brother 70   Heart attack Brother    Diabetes Brother    Colon cancer Neg Hx    Stomach cancer Neg Hx    Esophageal cancer Neg Hx    Rectal cancer Neg Hx     Social History   Social History Narrative   No regular exercise      Objective:   BP 124/60   Pulse 71   Temp 98.2 F (36.8 C) (Oral)    Ht 5' 8"  (1.727 m)   Wt 207 lb 2 oz (94 kg)   SpO2 92% Comment: 2L O2  BMI 31.49 kg/m   GEN: No acute distress; alert,appropriate. PULM: Breathing comfortably in no respiratory distress PSYCH: Normally interactive.   CV: RRR, no m/g/r  PULM: Normal respiratory rate, no accessory muscle use. No wheezes, crackles or rhonchi.  Distant breath sounds.  Currently wearing oxygen.  Laboratory and Imaging Data: Lab Review:     Latest Ref  Rng & Units 08/06/2022    4:24 AM 08/05/2022   10:50 AM 02/12/2022    8:08 AM  CBC EXTENDED  WBC 4.0 - 10.5 K/uL 11.4  12.5  6.0   RBC 4.22 - 5.81 MIL/uL 4.89  5.92  5.05   Hemoglobin 13.0 - 17.0 g/dL 14.5  17.0  15.0   HCT 39.0 - 52.0 % 42.7  49.3  43.9   Platelets 150 - 400 K/uL 127  162  191.0   NEUT# 1.7 - 7.7 K/uL  12.5  4.0   Lymph# 0.7 - 4.0 K/uL  0.5  1.4        Latest Ref Rng & Units 08/05/2022    6:04 AM 02/12/2022    8:08 AM 03/06/2021    8:56 AM  BMP  Glucose 70 - 99 mg/dL 161  123  181   BUN 8 - 23 mg/dL 15  20  20    Creatinine 0.61 - 1.24 mg/dL 1.01  0.97  1.23   Sodium 135 - 145 mmol/L 140  141  137   Potassium 3.5 - 5.1 mmol/L 4.3  5.2  4.6   Chloride 98 - 111 mmol/L 111  108  103   CO2 22 - 32 mmol/L 20  26  26    Calcium 8.9 - 10.3 mg/dL 9.4  9.6  9.4        Latest Ref Rng & Units 08/05/2022    6:04 AM 02/12/2022    8:08 AM 03/06/2021    8:56 AM  Hepatic Function  Total Protein 6.5 - 8.1 g/dL 8.0  7.2  7.0   Albumin 3.5 - 5.0 g/dL 4.7  5.0  4.1   AST 15 - 41 U/L 17  14  15    ALT 0 - 44 U/L 15  16  22    Alk Phosphatase 38 - 126 U/L 126  85  93   Total Bilirubin 0.3 - 1.2 mg/dL 1.5  1.1  1.1   Bilirubin, Direct 0.0 - 0.3 mg/dL  0.2  0.2     Lab Results  Component Value Date   CHOL 114 02/12/2022   Lab Results  Component Value Date   HDL 44.20 02/12/2022   Lab Results  Component Value Date   LDLCALC 59 02/12/2022   Lab Results  Component Value Date   TRIG 58.0 02/12/2022   Lab Results  Component Value Date    CHOLHDL 3 02/12/2022   No results for input(s): "PSA" in the last 72 hours.  Lab Results  Component Value Date   HGBA1C 6.2 02/12/2022   HGBA1C 6.7 (A) 06/11/2021   HGBA1C 8.0 (H) 03/06/2021   Lab Results  Component Value Date   MICROALBUR 6.2 (H) 06/05/2020   LDLCALC 59 02/12/2022   CREATININE 1.01 08/05/2022     Assessment and Plan:     ICD-10-CM   1. Multifocal pneumonia  J18.9 CBC with Differential/Platelet    2. Centrilobular emphysema ([place])  J43.2     3. Coronary artery disease of native artery of native heart with stable angina pectoris ([place])  I25.118     4. DM (diabetes mellitus) type II, controlled, with peripheral vascular disorder (HCC)  C00.34 Basic metabolic panel    5. Essential hypertension  I10     6. Hyperlipidemia LDL goal <70  E78.5     7. Dementia in other diseases classified elsewhere, unspecified severity, without behavioral disturbance, psychotic disturbance, mood disturbance, and anxiety (HCC)  F02.80 donepezil (ARICEPT) 5 MG tablet  8. Mild cognitive impairment  G31.84     9. Abnormal CT of the chest  R93.89      Total encounter time: 45 minutes. This includes total time spent on the day of encounter.    Globally, he is not doing all that well.  He continues to have some difficulty breathing and shortness of breath.  He is on Advair 250 mg inhaled twice daily.  Aside from this, he is not on any other medications inhaled other than some as needed albuterol.  I am going to start him on some Spiriva.  I think that he has to continue oxygen for now.  Given recent pneumonia and ongoing shortness of breath with hypoxia, I am going to give the patient 10 days of prednisone, as well.  Thankfully, he already does have a appointment set up with pulmonology in the next 2 weeks.  I really appreciate their help in this case.  Blood pressure and cholesterol are stable.  Type 2 diabetes, stable.  He is on metformin 1500 mg only. Anticipate increased blood  sugar on prednisone.  I had a long conversation with him as well as with his son about memory.  They do think that he is declining somewhat, and he admits this himself.  I think that he is starting to get some early dementia.  Given the risk and benefits, we are going to start him on some Aricept at 5 mg, and we can titrate this up and next time he comes in the office.  I reviewed the GI abnormality seen on his CT of the chest.  Question of fecalized material, also question of possible distal structural change or potential stricture.  Does need GI follow-up, and he does have a GI appointment in early December.  Cardiac patient, he is compliant  Medication Management during today's office visit: Meds ordered this encounter  Medications   tiotropium (SPIRIVA HANDIHALER) 18 MCG inhalation capsule    Sig: Place 1 capsule (18 mcg total) into inhaler and inhale daily.    Dispense:  30 capsule    Refill:  12   predniSONE (DELTASONE) 20 MG tablet    Sig: 2 tabs po daily for 5 days, then 1 tab po daily for 5 days    Dispense:  15 tablet    Refill:  0   donepezil (ARICEPT) 5 MG tablet    Sig: Take 1 tablet (5 mg total) by mouth at bedtime.    Dispense:  90 tablet    Refill:  1   There are no discontinued medications.  Orders placed today for conditions managed today: Orders Placed This Encounter  Procedures   Basic metabolic panel   CBC with Differential/Platelet    Disposition: No follow-ups on file.  Dr[employer] One speech-to-text software was used for transcription in this dictation.  Possible transcriptional errors can occur using Editor, commissioning.   Signed,  Maud Deed. Abrey Bradway, MD   Outpatient Encounter Medications as of 08/17/2022  Medication Sig   amLODipine (NORVASC) 10 MG tablet TAKE 1 TABLET BY MOUTH DAILY   aspirin 81 MG chewable tablet Chew 81 mg by mouth daily.   atorvastatin (LIPITOR) 40 MG tablet TAKE ONE TABLET BY MOUTH EVERY DAY   citalopram (CELEXA) 10 MG  tablet TAKE ONE TABLET BY MOUTH EVERY DAY   dextromethorphan-guaiFENesin (MUCINEX DM) 30-600 MG 12hr tablet Take 1 tablet by mouth 2 (two) times daily as needed for cough.   donepezil (ARICEPT) 5 MG tablet Take 1 tablet (  5 mg total) by mouth at bedtime.   dorzolamide-timolol (COSOPT) 22.3-6.8 MG/ML ophthalmic solution 1 drop 2 (two) times daily.   fluticasone-salmeterol (ADVAIR) 250-50 MCG/ACT AEPB INHALE 1 PUFF TWICE DAILY   latanoprost (XALATAN) 0.005 % ophthalmic solution Place 1 drop into the left eye at bedtime.   metFORMIN (GLUCOPHAGE-XR) 500 MG 24 hr tablet TAKE 3 TABLETS BY MOUTH DAILY   nitroGLYCERIN (NITROSTAT) 0.4 MG SL tablet Place 1 tablet (0.4 mg total) under the tongue every 5 (five) minutes as needed for chest pain.   pantoprazole (PROTONIX) 40 MG tablet Take 1 tablet (40 mg total) by mouth daily.   predniSONE (DELTASONE) 20 MG tablet 2 tabs po daily for 5 days, then 1 tab po daily for 5 days   tiotropium (SPIRIVA HANDIHALER) 18 MCG inhalation capsule Place 1 capsule (18 mcg total) into inhaler and inhale daily.   albuterol (VENTOLIN HFA) 108 (90 Base) MCG/ACT inhaler Inhale 2 puffs into the lungs every 6 (six) hours as needed for wheezing or shortness of breath.   No facility-administered encounter medications on file as of 08/17/2022.

## 2022-08-18 LAB — CBC WITH DIFFERENTIAL/PLATELET
Basophils Absolute: 0.1 10*3/uL (ref 0.0–0.1)
Basophils Relative: 0.7 % (ref 0.0–3.0)
Eosinophils Absolute: 0.2 10*3/uL (ref 0.0–0.7)
Eosinophils Relative: 2.6 % (ref 0.0–5.0)
HCT: 40.2 % (ref 39.0–52.0)
Hemoglobin: 13.9 g/dL (ref 13.0–17.0)
Lymphocytes Relative: 13.6 % (ref 12.0–46.0)
Lymphs Abs: 1.3 10*3/uL (ref 0.7–4.0)
MCHC: 34.7 g/dL (ref 30.0–36.0)
MCV: 86.8 fl (ref 78.0–100.0)
Monocytes Absolute: 0.5 10*3/uL (ref 0.1–1.0)
Monocytes Relative: 5.4 % (ref 3.0–12.0)
Neutro Abs: 7.6 10*3/uL (ref 1.4–7.7)
Neutrophils Relative %: 77.7 % — ABNORMAL HIGH (ref 43.0–77.0)
Platelets: 254 10*3/uL (ref 150.0–400.0)
RBC: 4.63 Mil/uL (ref 4.22–5.81)
RDW: 13.1 % (ref 11.5–15.5)
WBC: 9.8 10*3/uL (ref 4.0–10.5)

## 2022-08-18 LAB — BASIC METABOLIC PANEL WITH GFR
BUN: 18 mg/dL (ref 6–23)
CO2: 21 meq/L (ref 19–32)
Calcium: 9 mg/dL (ref 8.4–10.5)
Chloride: 108 meq/L (ref 96–112)
Creatinine, Ser: 1.13 mg/dL (ref 0.40–1.50)
GFR: 63.7 mL/min
Glucose, Bld: 123 mg/dL — ABNORMAL HIGH (ref 70–99)
Potassium: 4.4 meq/L (ref 3.5–5.1)
Sodium: 138 meq/L (ref 135–145)

## 2022-08-19 ENCOUNTER — Ambulatory Visit: Payer: PPO | Admitting: Dermatology

## 2022-09-02 ENCOUNTER — Encounter: Payer: Self-pay | Admitting: Pulmonary Disease

## 2022-09-02 ENCOUNTER — Ambulatory Visit: Payer: PPO | Admitting: Pulmonary Disease

## 2022-09-02 ENCOUNTER — Ambulatory Visit (INDEPENDENT_AMBULATORY_CARE_PROVIDER_SITE_OTHER): Payer: PPO

## 2022-09-02 VITALS — BP 130/64 | HR 64 | Ht 68.0 in | Wt 208.2 lb

## 2022-09-02 DIAGNOSIS — J432 Centrilobular emphysema: Secondary | ICD-10-CM

## 2022-09-02 DIAGNOSIS — J9611 Chronic respiratory failure with hypoxia: Secondary | ICD-10-CM | POA: Diagnosis not present

## 2022-09-02 DIAGNOSIS — R1319 Other dysphagia: Secondary | ICD-10-CM

## 2022-09-02 DIAGNOSIS — J690 Pneumonitis due to inhalation of food and vomit: Secondary | ICD-10-CM | POA: Diagnosis not present

## 2022-09-02 DIAGNOSIS — J189 Pneumonia, unspecified organism: Secondary | ICD-10-CM | POA: Diagnosis not present

## 2022-09-02 DIAGNOSIS — Z23 Encounter for immunization: Secondary | ICD-10-CM | POA: Diagnosis not present

## 2022-09-02 MED ORDER — TRELEGY ELLIPTA 100-62.5-25 MCG/ACT IN AEPB: 1.0000 | INHALATION_SPRAY | Freq: Every day | RESPIRATORY_TRACT | 0 refills | Status: DC

## 2022-09-02 NOTE — Patient Instructions (Signed)
Aspiration pneumonia: Given the esophageal dysphagia you have experienced I am going to get an esophagram to assess further We will send these results to the gastroenterologist there is planning to see you  Chronic respiratory failure with hypoxemia: Keep using oxygen as you are doing for now, though I think by the next visit you probably want needed further  COPD: PFT next visit Once you have completed your current supply stop taking Advair and Spiriva and then taking Trelegy 1 puff daily Keep using albuterol as needed for chest tightness wheezing or shortness of breath Flu shot today  Follow-up in 4 to 6 weeks or sooner if needed

## 2022-09-02 NOTE — Progress Notes (Signed)
Subjective:    Patient ID: [name], male    DOB: [birth year], 76 y.o.   MRN: [id number]  Synopsis: First referred in 2015 for dyspnea, found to have mild airflow obstruction but moderate to severe upper lobe predominant centrilobular emphysema. He smoked for 20 years, quit in 1992 after smoking 2 ppd.  His son worked at the pulmonary rehab facility at [facility] for several years.   HPI Chief Complaint  Patient presents with   Hospitalization Follow-up    HFU for PNA. States he has been doing well since being home.     [name] was hospitalized for pneumonia in September. Since coming home he is feeling better He remains on oxygen at home He has been checking his oxygen at home For a few weeks he still had some wheezing and Dr. Lorelei Pont presribed prednisone which helped. He has had problems with pot roast, other stringy foods getting hung up. He has trouble with his dentures so he can't chew appropriately.  He feels good, getting around a bit. He has a raspy voice and is hoarse.   He hasn't had flares of COPD since this visit.   Record review: Hospitalized September 2023 for community-acquired pneumonia.  Procalcitonin negative, respiratory viral panel negative, strep pneumo negative.  Treated with Levaquin given allergy history.  There was some concern for aspiration based on a CT scan result.  Treated with 2 L of oxygen.  Speech therapy saw the patient and noted findings worrisome for reflux however there was no oropharyngeal phase aspiration noted on evaluation.  Past Medical History:  Diagnosis Date   Aortic transection 2005   Traumatic after a fall from a second floor. s/p repair at [facility]   Basal cell carcinoma    CAD (coronary artery disease) 1992   a. 1992 Acute anterior MI, thrombolytic therapy-->cath reportedly w/o significant CAD; b. 08/2016 MV: EF 57%, hypertensive respons, no ischemia/infarct; c. 04/2017 CTA chest w/ coronary Ca2+.   Depression    Diastolic dysfunction     a. 04/2017 Echo: EF 60-65%, Gr1 DD, nl RV fxn.   Erectile dysfunction    Fall 2005   fell off house: torn aorta, clavicle fracture, rib fracture, vertebral fractures, lung contusion, coma x 2 weeks   Fracture of femoral neck, left ([place]) 08/09/2014   ORIF, Dr. Marry Guan   Fracture of radial neck, left, closed 08/09/2014   Gallstone pancreatitis    Gastric ulcer    History of ATN 08/09/2014   [facility], 2 days of dialysis (ARF)   Hyperlipidemia    Statin with joint pain    Hypertension    Obesity    Peripheral vascular disease ([place])    Atherosclerotic:R renal artery stenosis   Type II diabetes mellitus ([place])    Upper GI bleed    a. 04/2017 admit w/ melena and HGB down to 7 req prbc's; b. 04/2017 EGD: small HH, non-bleeding gastric & duod ulcers, non-bleeding erosive gastropathy-->PPI Rx.      Review of Systems  Constitutional:  Negative for chills, fatigue and fever.  HENT:  Negative for postnasal drip, rhinorrhea and sinus pain.   Respiratory:  Positive for shortness of breath. Negative for chest tightness and wheezing.   Cardiovascular:  Negative for chest pain, palpitations and leg swelling.       Objective:   Physical Exam Vitals:   09/02/22 1316  BP: 130/64  Pulse: 64  SpO2: 97%  Weight: 208 lb 3.2 oz (94.4 kg)  Height: '5\' 8"'$  (1.727 m)  2L [place]  Gen: chronically ill appearing, no acute distress HENT: NCAT, OP clear, neck supple without masses Eyes: PERRL, EOMi Lymph: no cervical lymphadenopathy PULM: CTA B CV: RRR, no mgr, no JVD GI: BS+, soft, nontender, no hsm Derm: no rash or skin breakdown MSK: normal bulk and tone Neuro: A&Ox4, CN II-XII intact, strength 5/5 in all 4 extremities Psyche: normal mood and affect    Cardiology records from June 2018 reviewed were he was seen for coronary disease hypertension and hyperlipidemia.  CBC    Component Value Date/Time   WBC 9.8 08/17/2022 1511   RBC 4.63 08/17/2022 1511   HGB 13.9 08/17/2022 1511   HGB 10.6 (L)  08/23/2014 0619   HCT 40.2 08/17/2022 1511   HCT 31.5 (L) 08/23/2014 0619   PLT 254.0 08/17/2022 1511   PLT 328 08/23/2014 0619   MCV 86.8 08/17/2022 1511   MCV 92 08/23/2014 0619   MCH 29.7 08/06/2022 0424   MCHC 34.7 08/17/2022 1511   RDW 13.1 08/17/2022 1511   RDW 14.3 08/23/2014 0619   LYMPHSABS 1.3 08/17/2022 1511   LYMPHSABS 1.0 08/23/2014 0619   MONOABS 0.5 08/17/2022 1511   MONOABS 0.5 08/23/2014 0619   EOSABS 0.2 08/17/2022 1511   EOSABS 0.3 08/23/2014 0619   BASOSABS 0.1 08/17/2022 1511   BASOSABS 0.0 08/23/2014 2952   Imaging: 2017 nuclear stress test unremarkable 01/2017 HRCT> moderate to severe centrilobular emphysema, no ILD, three vessel CAD noted, mild calcification aortic valve Images independently reviewed 03/08/2017 04/2017 CT angio chest > no PE, upper lobe predominant emphysema September 2023 CT chest images independently reviewed showing moderate to severe centrilobular emphysema and an upper lobe distribution, patulous esophagus with food present, tree-in-bud abnormalities predominantly in the bases right greater than left  Echocardiogram  October 2017 normal LVEF and normal estimated PA pressure  PFT: 09/2016 Ratio 64%, FEV1 2.96 L 106% predicted, FVC 4.61 L 113% predicted, total lung capacity 7.55 125% predicted, residual volume 124% predicted, DLCO 24.3 mL/m/mm of mercury 79% predicted  Bronchoscopy: 2018 Normal   Polysomnogram: 07/2017 AHI 24, O2 saturation average low 84%      Assessment & Plan:  Centrilobular emphysema (HCC)  Chronic respiratory failure with hypoxia (HCC)  Esophageal dysphagia  Aspiration pneumonia of right lower lobe due to regurgitated food ([place])  Discussion: Vanden had aspiration pneumonia followed by a COPD exacerbation.  Thankfully he is now doing much better with the addition of Tiotropium and a course of prednisone from his primary care physician.  It sounds as if he aspirates from esophageal dysphagia.  In  general he has started to improve.  At some point we will need to assess his COPD again but now is not the right time considering his recent pneumonia.  Plan: Aspiration pneumonia: Given the esophageal dysphagia you have experienced I am going to get an esophagram to assess further We will send these results to the gastroenterologist there is planning to see you  Chronic respiratory failure with hypoxemia: Keep using oxygen as you are doing for now, though I think by the next visit you probably want needed further  COPD: PFT next visit Once you have completed your current supply stop taking Advair and Spiriva and then taking Trelegy 1 puff daily Keep using albuterol as needed for chest tightness wheezing or shortness of breath Flu shot today  Follow-up in 4 to 6 weeks or sooner if needed   Immunization History  Administered Date(s) Administered   Fluad Quad(high Dose 65+) 09/14/2019, 11/27/2020  Influenza Split 10/04/2012   Influenza Whole 08/16/2008, 08/01/2014   Influenza, High Dose Seasonal PF 09/16/2017   Influenza,inj,Quad PF,6+ Mos 10/11/2013, 10/18/2015, 08/24/2016, 09/29/2018   PFIZER(Purple Top)SARS-COV-2 Vaccination 01/08/2020, 02/01/2020   Pneumococcal Conjugate-13 11/18/2015   Pneumococcal Polysaccharide-23 06/13/2012   Td 07/01/2015        Current Outpatient Medications:    amLODipine (NORVASC) 10 MG tablet, TAKE 1 TABLET BY MOUTH DAILY, Disp: 90 tablet, Rfl: 1   aspirin 81 MG chewable tablet, Chew 81 mg by mouth daily., Disp: , Rfl:    atorvastatin (LIPITOR) 40 MG tablet, TAKE ONE TABLET BY MOUTH EVERY DAY, Disp: 90 tablet, Rfl: 3   citalopram (CELEXA) 10 MG tablet, TAKE ONE TABLET BY MOUTH EVERY DAY, Disp: 90 tablet, Rfl: 1   dextromethorphan-guaiFENesin (MUCINEX DM) 30-600 MG 12hr tablet, Take 1 tablet by mouth 2 (two) times daily as needed for cough., Disp: 30 tablet, Rfl: 0   donepezil (ARICEPT) 5 MG tablet, Take 1 tablet (5 mg total) by mouth at bedtime.,  Disp: 90 tablet, Rfl: 1   dorzolamide-timolol (COSOPT) 22.3-6.8 MG/ML ophthalmic solution, 1 drop 2 (two) times daily., Disp: , Rfl:    fluticasone-salmeterol (ADVAIR) 250-50 MCG/ACT AEPB, INHALE 1 PUFF TWICE DAILY, Disp: 60 each, Rfl: 5   latanoprost (XALATAN) 0.005 % ophthalmic solution, Place 1 drop into the left eye at bedtime., Disp: , Rfl:    metFORMIN (GLUCOPHAGE-XR) 500 MG 24 hr tablet, TAKE 3 TABLETS BY MOUTH DAILY, Disp: 270 tablet, Rfl: 3   nitroGLYCERIN (NITROSTAT) 0.4 MG SL tablet, Place 1 tablet (0.4 mg total) under the tongue every 5 (five) minutes as needed for chest pain., Disp: 25 tablet, Rfl: 6   pantoprazole (PROTONIX) 40 MG tablet, Take 1 tablet (40 mg total) by mouth daily., Disp: 30 tablet, Rfl: 2   tiotropium (SPIRIVA HANDIHALER) 18 MCG inhalation capsule, Place 1 capsule (18 mcg total) into inhaler and inhale daily., Disp: 30 capsule, Rfl: 12   albuterol (VENTOLIN HFA) 108 (90 Base) MCG/ACT inhaler, Inhale 2 puffs into the lungs every 6 (six) hours as needed for wheezing or shortness of breath., Disp: 1 each, Rfl: 3

## 2022-09-08 ENCOUNTER — Ambulatory Visit
Admission: RE | Admit: 2022-09-08 | Discharge: 2022-09-08 | Disposition: A | Discharge status: Home / Self Care | Payer: PPO | Source: Ambulatory Visit | Attending: Pulmonary Disease | Admitting: Pulmonary Disease

## 2022-09-08 DIAGNOSIS — R1310 Dysphagia, unspecified: Secondary | ICD-10-CM | POA: Diagnosis not present

## 2022-09-08 DIAGNOSIS — J690 Pneumonitis due to inhalation of food and vomit: Secondary | ICD-10-CM | POA: Insufficient documentation

## 2022-09-08 DIAGNOSIS — K224 Dyskinesia of esophagus: Secondary | ICD-10-CM | POA: Diagnosis not present

## 2022-09-08 DIAGNOSIS — K219 Gastro-esophageal reflux disease without esophagitis: Secondary | ICD-10-CM | POA: Diagnosis not present

## 2022-09-08 DIAGNOSIS — R1319 Other dysphagia: Secondary | ICD-10-CM

## 2022-09-12 DIAGNOSIS — J189 Pneumonia, unspecified organism: Secondary | ICD-10-CM | POA: Diagnosis not present

## 2022-09-12 DIAGNOSIS — J441 Chronic obstructive pulmonary disease with (acute) exacerbation: Secondary | ICD-10-CM | POA: Diagnosis not present

## 2022-09-12 DIAGNOSIS — I5032 Chronic diastolic (congestive) heart failure: Secondary | ICD-10-CM | POA: Diagnosis not present

## 2022-09-12 DIAGNOSIS — J690 Pneumonitis due to inhalation of food and vomit: Secondary | ICD-10-CM | POA: Diagnosis not present

## 2022-09-29 ENCOUNTER — Other Ambulatory Visit: Payer: Self-pay | Admitting: Family Medicine

## 2022-10-06 NOTE — Progress Notes (Unsigned)
Date:  10/07/2022   ID:  [name], DOB 06-09-1946, MRN [id number]  Patient Location:  [place] [id number]   Provider location:   [facility] [place] office  PCP:  [name], MD  Cardiologist:  Patsy Baltimore  Chief Complaint  Patient presents with   New Patient (Initial Visit)    Re-establish care for CAD. "Doing well." Medications reviewed by the patient verbally.     History of Present Illness:    [name] is a 76 y.o. male  past medical history of 76 year old male with  status post anterior myocardial infarction in 1992, which was treated with TPA.  Catheterization  did not show any significant coronary artery disease. [place] 2005 off house,  torn aorta, clavicle fracture, rib fracture, vertebral fractures, lung contusion, coma x 2 weeks hypertension,  hyperlipidemia,  Diabetes, obesity,  peptic ulcer disease.  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 him today  Discussed recent hospitalization [place] 2023: PNA, concern for aspiration Has follow-up with GI He denies significant GERD symptoms  Echo 2018: Normal ejection fraction  Lab work reviewed A1C 6.2 in march 2023 Cholesterol at goal on Lipitor 40 daily  Using CPAP Denies chest pain concerning for angina  EKG personally reviewed by myself on todays visit Normal sinus rhythm rate 64 bpm no significant ST-T wave changes  Lab Results  Component Value Date   CHOL 114 02/12/2022   HDL 44.20 02/12/2022   [place] 59 02/12/2022   TRIG 58.0 02/12/2022    Prior records reviewed for today's visit  hospitalization  June 2018, melena and hemoptysis in the setting of bronchitis.    hemoglobin dropped to 7.  transfusion.   EGD by Dr. Allen Norris which showed gastric and duodenal nonbleeding ulcers and also erosive gastropathy.   PPI therapy.    CTA of the chest  significant contrast reaction  Admitted Troponin trend was flat at 0.20  0.16  0.16  normal LV function by echo   negative stress test  October 2017  for dyspnea on exertion.  Echocardiogram showed normal LV function with grade 1 diastolic dysfunction.   Stress testing in 08/2016 was normal with the exception of hypertensive response to exercise.    Past Medical History:  Diagnosis Date   Aortic transection 2005   Traumatic after a fall from a second floor. s/p repair at [facility]   Basal cell carcinoma    CAD (coronary artery disease) 1992   a. 1992 Acute anterior MI, thrombolytic therapy-->cath reportedly w/o significant CAD; b. 08/2016 MV: EF 57%, hypertensive respons, no ischemia/infarct; c. 04/2017 CTA chest w/ coronary Ca2+.   Depression    Diastolic dysfunction    a. 04/2017 Echo: EF 60-65%, Gr1 DD, nl RV fxn.   Erectile dysfunction    Fall 2005   fell off house: torn aorta, clavicle fracture, rib fracture, vertebral fractures, lung contusion, coma x 2 weeks   Fracture of femoral neck, left ([place]) 08/09/2014   ORIF, Dr. Marry Guan   Fracture of radial neck, left, closed 08/09/2014   Gallstone pancreatitis    Gastric ulcer    History of ATN 08/09/2014   [facility], 2 days of dialysis (ARF)  Hyperlipidemia    Statin with joint pain    Hypertension    Obesity    Peripheral vascular disease ([place])    Atherosclerotic:R renal artery stenosis   Type II diabetes mellitus ([place])    Upper GI bleed    a. 04/2017 admit w/ melena and HGB down to 7 req prbc's; b. 04/2017 EGD: small HH, non-bleeding gastric & duod ulcers, non-bleeding erosive gastropathy-->PPI Rx.   Past Surgical History:  Procedure Laterality Date   CARDIAC CATHETERIZATION  08/1991   50 % mid-Lad stenosis with clot, 25-505 second marginal   CARDIOVASCULAR  SURGERY     with ruptured Aorta, Dr. Camila Li, [place]     ESOPHAGOGASTRODUODENOSCOPY (EGD) WITH PROPOFOL N/A 05/03/2017   Procedure: ESOPHAGOGASTRODUODENOSCOPY (EGD) WITH PROPOFOL;  Surgeon: Lucilla Lame, MD;  [facility] ENDOSCOPY;  Service: Endoscopy;  Laterality: N/A;   KNEE SURGERY     Right    ORIF HIP FRACTURE Left 08/12/2014   Dr. Marry Guan   THORACOTOMY     thoracic aorta repair    TRACHEOSTOMY     s/p reversal   VIDEO BRONCHOSCOPY Bilateral 05/27/2017   Procedure: VIDEO BRONCHOSCOPY WITHOUT FLUORO;  Surgeon: Juanito Doom, MD;  Location: [facility] ENDOSCOPY;  Service: Cardiopulmonary;  Laterality: Bilateral;     Current Meds  Medication Sig   albuterol (VENTOLIN HFA) 108 (90 Base) MCG/ACT inhaler Inhale 2 puffs into the lungs every 6 (six) hours as needed for wheezing or shortness of breath.   amLODipine (NORVASC) 10 MG tablet TAKE 1 TABLET BY MOUTH DAILY   aspirin 81 MG chewable tablet Chew 81 mg by mouth daily.   atorvastatin (LIPITOR) 40 MG tablet TAKE ONE TABLET BY MOUTH EVERY DAY   citalopram (CELEXA) 10 MG tablet TAKE ONE TABLET BY MOUTH EVERY DAY   dextromethorphan-guaiFENesin (MUCINEX DM) 30-600 MG 12hr tablet Take 1 tablet by mouth 2 (two) times daily as needed for cough.   donepezil (ARICEPT) 5 MG tablet Take 1 tablet (5 mg total) by mouth at bedtime.   dorzolamide-timolol (COSOPT) 22.3-6.8 MG/ML ophthalmic solution 1 drop 2 (two) times daily.   Fluticasone-Umeclidin-Vilant (TRELEGY ELLIPTA) 100-62.5-25 MCG/ACT AEPB Inhale 1 puff into the lungs daily.   latanoprost (XALATAN) 0.005 % ophthalmic solution Place 1 drop into the left eye at bedtime.   metFORMIN (GLUCOPHAGE-XR) 500 MG 24 hr tablet TAKE 3 TABLETS BY MOUTH DAILY   nitroGLYCERIN (NITROSTAT) 0.4 MG SL tablet Place 1 tablet (0.4 mg total) under the tongue every 5 (five) minutes as needed for chest pain.   pantoprazole (PROTONIX) 40 MG tablet Take 1 tablet (40 mg total) by mouth daily.      Allergies:   Contrast media [iodinated contrast media], Penicillins, Xarelto [rivaroxaban], Clindamycin/lincomycin, Dye fdc red [red dye], and Lovenox [enoxaparin sodium]   Social History   Tobacco Use   Smoking status: Former    Packs/day: 1.50    Years: 20.00    Total pack years: 30.00    Types: Cigarettes    Quit date: 09/13/1991    Years since quitting: 31.0   Smokeless tobacco: Never   Tobacco comments:    quit post MI  Vaping Use   Vaping Use: Never used  Substance Use Topics   Alcohol use: Yes    Comment: 1-2 cansof beer per month   Drug use: No     Family Hx: The patient's family history includes Dementia (age of onset: 72) in his mother; Diabetes in his brother; Diabetes (age of  onset: 81) in his brother; Heart attack in his brother; Heart attack (age of onset: 50) in his father. There is no history of Colon cancer, Stomach cancer, Esophageal cancer, or Rectal cancer.  ROS:   Please see the history of present illness.    Review of Systems  Constitutional: Negative.   Respiratory:  Positive for shortness of breath.   Cardiovascular: Negative.   Gastrointestinal: Negative.   Musculoskeletal: Negative.   Neurological: Negative.   Psychiatric/Behavioral: Negative.    All other systems reviewed and are negative.    Labs/Other Tests and Data Reviewed:    Recent Labs: 08/05/2022: ALT 15; B Natriuretic Peptide 78.2 08/17/2022: BUN 18; Creatinine, Ser 1.13; Hemoglobin 13.9; Platelets 254.0; Potassium 4.4; Sodium 138   Recent Lipid Panel Lab Results  Component Value Date/Time   CHOL 114 02/12/2022 08:08 AM   CHOL 120 08/10/2014 09:28 PM   TRIG 58.0 02/12/2022 08:08 AM   TRIG 164 08/10/2014 09:28 PM   HDL 44.20 02/12/2022 08:08 AM   HDL 29 (L) 08/10/2014 09:28 PM   CHOLHDL 3 02/12/2022 08:08 AM   LDLCALC 59 02/12/2022 08:08 AM   LDLCALC 58 08/10/2014 09:28 PM   LDLDIRECT 173.5 05/23/2012 12:15 PM    Wt Readings from Last 3 Encounters:  10/07/22 206 lb 2 oz  (93.5 kg)  09/02/22 208 lb 3.2 oz (94.4 kg)  08/17/22 207 lb 2 oz (94 kg)     Exam:    Vital Signs: Vital signs may also be detailed in the HPI BP 124/64 (BP Location: Left Arm, Patient Position: Sitting, Cuff Size: Normal)   Pulse 64   Ht '5\' 8"'$  (1.727 m)   Wt 206 lb 2 oz (93.5 kg)   SpO2 96%   BMI 31.34 kg/m   Constitutional:  oriented to person, place, and time. No distress.  HENT:  Head: Grossly normal Eyes:  no discharge. No scleral icterus.  Neck: No JVD, no carotid bruits  Cardiovascular: Regular rate and rhythm, no murmurs appreciated Pulmonary/Chest: Clear to auscultation bilaterally, no wheezes or rails Abdominal: Soft.  no distension.  no tenderness.  Musculoskeletal: Normal range of motion Neurological:  normal muscle tone. Coordination normal. No atrophy Skin: Skin warm and dry Psychiatric: normal affect, pleasant   ASSESSMENT & PLAN:    Coronary artery disease of native artery of native heart with stable angina pectoris Centrilobular emphysema (HCC) DM (diabetes mellitus) type II, controlled, with peripheral vascular disorder (HCC) Aortic transection, sequela Hyperlipidemia LDL goal <70 Dyspnea, unspecified type Essential hypertension   Coronary artery disease of native artery of native heart with stable angina pectoris ([place]) Currently with no symptoms of angina. No further workup at this time. Continue current medication regimen.  Centrilobular emphysema ([place]) Recent hospitalization for pneumonia September 2023, recovered Followed by pulmonary, PFTs ordered   Hyperlipidemia LDL goal <70 Cholesterol is at goal on the current lipid regimen. No changes to the medications were made.   DM (diabetes mellitus) type II, controlled, with peripheral vascular disorder (HCC) A1c improving, low 6 range, on metformin 3 pills a day Sedentary at baseline   Aortic transection, sequela  fell off a horse 2005   stable post surgery   Shortness of breath Secondary  to COPD, deconditioning, obesity Recommend he start walking program    Total encounter time more than 50 minutes  Greater than 50% was spent in counseling and coordination of care with the patient    Signed, Ida Rogue, MD  10/07/2022 9:44 AM    [facility] [address], [place], [place] [address]

## 2022-10-07 ENCOUNTER — Encounter: Payer: Self-pay | Admitting: Cardiovascular Disease

## 2022-10-07 ENCOUNTER — Ambulatory Visit: Payer: PPO | Attending: Cardiovascular Disease | Admitting: Cardiovascular Disease

## 2022-10-07 VITALS — BP 124/64 | HR 64 | Ht 68.0 in | Wt 206.1 lb

## 2022-10-07 DIAGNOSIS — E785 Hyperlipidemia, unspecified: Secondary | ICD-10-CM

## 2022-10-07 DIAGNOSIS — S2509XS Other specified injury of thoracic aorta, sequela: Secondary | ICD-10-CM

## 2022-10-07 DIAGNOSIS — I10 Essential (primary) hypertension: Secondary | ICD-10-CM

## 2022-10-07 DIAGNOSIS — I5032 Chronic diastolic (congestive) heart failure: Secondary | ICD-10-CM

## 2022-10-07 DIAGNOSIS — E1151 Type 2 diabetes mellitus with diabetic peripheral angiopathy without gangrene: Secondary | ICD-10-CM

## 2022-10-07 DIAGNOSIS — I25118 Atherosclerotic heart disease of native coronary artery with other forms of angina pectoris: Secondary | ICD-10-CM | POA: Diagnosis not present

## 2022-10-07 DIAGNOSIS — G4733 Obstructive sleep apnea (adult) (pediatric): Secondary | ICD-10-CM

## 2022-10-07 DIAGNOSIS — I739 Peripheral vascular disease, unspecified: Secondary | ICD-10-CM | POA: Diagnosis not present

## 2022-10-07 DIAGNOSIS — F0280 Dementia in other diseases classified elsewhere without behavioral disturbance: Secondary | ICD-10-CM

## 2022-10-07 NOTE — Patient Instructions (Addendum)
Medication Instructions:  No changes  If you need a refill on your cardiac medications before your next appointment, please call your pharmacy.   Lab work: No new labs needed  Testing/Procedures: No new testing needed  Follow-Up: At [facility], you and your health needs are our priority.  As part of our continuing mission to provide you with exceptional heart care, we have created designated Provider Care Teams.  These Care Teams include your primary Cardiologist (physician) and Advanced Practice Providers (APPs -  Physician Assistants and Nurse Practitioners) who all work together to provide you with the care you need, when you need it.  You will need a follow up appointment in 12 months  Providers on your designated Care Team:   Christopher Berge, NP Ryan Dunn, PA-C Cadence Furth, PA-C  COVID-19 Vaccine Information can be found at: [email or web address] For questions related to vaccine distribution or appointments, please email [email or web address] or call [phone].   

## 2022-10-13 DIAGNOSIS — J441 Chronic obstructive pulmonary disease with (acute) exacerbation: Secondary | ICD-10-CM | POA: Diagnosis not present

## 2022-10-13 DIAGNOSIS — I5032 Chronic diastolic (congestive) heart failure: Secondary | ICD-10-CM | POA: Diagnosis not present

## 2022-10-13 DIAGNOSIS — J189 Pneumonia, unspecified organism: Secondary | ICD-10-CM | POA: Diagnosis not present

## 2022-10-13 DIAGNOSIS — J690 Pneumonitis due to inhalation of food and vomit: Secondary | ICD-10-CM | POA: Diagnosis not present

## 2022-10-20 ENCOUNTER — Encounter: Payer: Self-pay | Admitting: Pulmonary Disease

## 2022-10-20 ENCOUNTER — Ambulatory Visit (INDEPENDENT_AMBULATORY_CARE_PROVIDER_SITE_OTHER): Payer: PPO | Admitting: Pulmonary Disease

## 2022-10-20 VITALS — BP 136/64 | HR 66 | Temp 97.9°F | Ht 68.0 in | Wt 201.2 lb

## 2022-10-20 DIAGNOSIS — R1319 Other dysphagia: Secondary | ICD-10-CM | POA: Diagnosis not present

## 2022-10-20 DIAGNOSIS — J690 Pneumonitis due to inhalation of food and vomit: Secondary | ICD-10-CM

## 2022-10-20 DIAGNOSIS — J9611 Chronic respiratory failure with hypoxia: Secondary | ICD-10-CM | POA: Diagnosis not present

## 2022-10-20 DIAGNOSIS — J432 Centrilobular emphysema: Secondary | ICD-10-CM | POA: Diagnosis not present

## 2022-10-20 LAB — PULMONARY FUNCTION TEST
DL/VA % pred: 67 %
DL/VA: 2.69 ml/min/mmHg/L
DLCO cor % pred: 66 %
DLCO cor: 15.72 ml/min/mmHg
DLCO unc % pred: 66 %
DLCO unc: 15.72 ml/min/mmHg
FEF 25-75 Post: 1.82 L/s
FEF 25-75 Pre: 1.49 L/s
FEF2575-%Change-Post: 22 %
FEF2575-%Pred-Post: 89 %
FEF2575-%Pred-Pre: 73 %
FEV1-%Change-Post: 5 %
FEV1-%Pred-Post: 102 %
FEV1-%Pred-Pre: 97 %
FEV1-Post: 2.88 L
FEV1-Pre: 2.74 L
FEV1FVC-%Change-Post: 2 %
FEV1FVC-%Pred-Pre: 92 %
FEV6-%Change-Post: 4 %
FEV6-%Pred-Post: 113 %
FEV6-%Pred-Pre: 107 %
FEV6-Post: 4.14 L
FEV6-Pre: 3.94 L
FEV6FVC-%Change-Post: 2 %
FEV6FVC-%Pred-Post: 105 %
FEV6FVC-%Pred-Pre: 103 %
FVC-%Change-Post: 2 %
FVC-%Pred-Post: 107 %
FVC-%Pred-Pre: 104 %
FVC-Post: 4.2 L
FVC-Pre: 4.08 L
Post FEV1/FVC ratio: 69 %
Post FEV6/FVC ratio: 98 %
Pre FEV1/FVC ratio: 67 %
Pre FEV6/FVC Ratio: 96 %
RV % pred: 96 %
RV: 2.36 L
TLC % pred: 97 %
TLC: 6.5 L

## 2022-10-20 MED ORDER — TRELEGY ELLIPTA 100-62.5-25 MCG/ACT IN AEPB: 1.0000 | INHALATION_SPRAY | Freq: Every day | RESPIRATORY_TRACT | 0 refills | Status: DC

## 2022-10-20 NOTE — Progress Notes (Addendum)
Subjective:    Patient ID: [name], male    DOB: Jun 27, 1946, 76 y.o.   MRN: [id number]  Synopsis: First referred in 2015 for dyspnea, found to have mild airflow obstruction but moderate to severe upper lobe predominant centrilobular emphysema. He smoked for 20 years, quit in 1992 after smoking 2 ppd.  His son worked at the pulmonary rehab facility at [facility] for several years. Presented back to the pulmonary clinic in 2023 after having an episode of pneumonia which required short-term oxygen use. Felt to be aspiration pneumonia due to esophageal reflux.   HPI Chief Complaint  Patient presents with   Follow-up    Follow up after PFT. Nothing new since LOV     76 year old male with COPD who recently had pneumonia and required oxygen afterwards.  Here for follow-up.  He says that his breathing is doing fine, he stopped using oxygen.  He says that the Trelegy is making him breathe better and he likes that its only 1 puff daily.  No recent problems with reflux but the barium esophagram showed severe reflux to the cervical esophagus.  Past Medical History:  Diagnosis Date   Aortic transection 2005   Traumatic after a fall from a second floor. s/p repair at [facility]   Basal cell carcinoma    CAD (coronary artery disease) 1992   a. 1992 Acute anterior MI, thrombolytic therapy-->cath reportedly w/o significant CAD; b. 08/2016 MV: EF 57%, hypertensive respons, no ischemia/infarct; c. 04/2017 CTA chest w/ coronary Ca2+.   Depression    Diastolic dysfunction    a. 04/2017 Echo: EF 60-65%, Gr1 DD, nl RV fxn.   Erectile dysfunction    Fall 2005   fell off house: torn aorta, clavicle fracture, rib fracture, vertebral fractures, lung contusion, coma x 2 weeks   Fracture of femoral neck, left ([place]) 08/09/2014   ORIF, Dr. Marry Guan   Fracture of radial neck, left, closed 08/09/2014   Gallstone pancreatitis    Gastric ulcer    History of ATN 08/09/2014   [facility], 2 days of dialysis (ARF)   Hyperlipidemia     Statin with joint pain    Hypertension    Obesity    Peripheral vascular disease ([place])    Atherosclerotic:R renal artery stenosis   Type II diabetes mellitus ([place])    Upper GI bleed    a. 04/2017 admit w/ melena and HGB down to 7 req prbc's; b. 04/2017 EGD: small HH, non-bleeding gastric & duod ulcers, non-bleeding erosive gastropathy-->PPI Rx.      Review of Systems  Constitutional:  Negative for chills, fatigue and fever.  HENT:  Negative for postnasal drip, rhinorrhea and sinus pain.   Respiratory:  Positive for shortness of breath. Negative for chest tightness and wheezing.   Cardiovascular:  Negative for chest pain, palpitations and leg swelling.       Objective:   Physical Exam Vitals:   10/20/22 1606  BP: 136/64  Pulse: 66  Temp: 97.9 F (36.6 C)  TempSrc: Oral  SpO2: 93%  Weight: 201 lb 3.2 oz (91.3 kg)  Height: '5\' 8"'$  (1.727 m)    2L [place]  Gen: well appearing HENT: OP clear, neck supple PULM: CTA B, normal effort  CV: RRR, no mgr GI: BS+, soft, nontender Derm: no cyanosis or rash Psyche: normal mood and affect    Cardiology records from June 2018 reviewed were he was seen for coronary disease hypertension and hyperlipidemia.  CBC    Component Value Date/Time  WBC 9.8 08/17/2022 1511   RBC 4.63 08/17/2022 1511   HGB 13.9 08/17/2022 1511   HGB 10.6 (L) 08/23/2014 0619   HCT 40.2 08/17/2022 1511   HCT 31.5 (L) 08/23/2014 0619   PLT 254.0 08/17/2022 1511   PLT 328 08/23/2014 0619   MCV 86.8 08/17/2022 1511   MCV 92 08/23/2014 0619   MCH 29.7 08/06/2022 0424   MCHC 34.7 08/17/2022 1511   RDW 13.1 08/17/2022 1511   RDW 14.3 08/23/2014 0619   LYMPHSABS 1.3 08/17/2022 1511   LYMPHSABS 1.0 08/23/2014 0619   MONOABS 0.5 08/17/2022 1511   MONOABS 0.5 08/23/2014 0619   EOSABS 0.2 08/17/2022 1511   EOSABS 0.3 08/23/2014 0619   BASOSABS 0.1 08/17/2022 1511   BASOSABS 0.0 08/23/2014 3734   Imaging: 2017 nuclear stress test unremarkable 01/2017 HRCT>  moderate to severe centrilobular emphysema, no ILD, three vessel CAD noted, mild calcification aortic valve Images independently reviewed 03/08/2017 04/2017 CT angio chest > no PE, upper lobe predominant emphysema September 2023 CT chest images independently reviewed showing moderate to severe centrilobular emphysema and an upper lobe distribution, patulous esophagus with food present, tree-in-bud abnormalities predominantly in the bases right greater than left 2023 barium swallow showed dysmotility and reflux to the cervical esophagus  Echocardiogram  October 2017 normal LVEF and normal estimated PA pressure  PFT: 09/2016 Ratio 64%, FEV1 2.96 L 106% predicted, FVC 4.61 L 113% predicted, total lung capacity 7.55 125% predicted, residual volume 124% predicted, DLCO 24.3 mL/m/mm of mercury 79% predicted 09/2022 PFT ratio 69%, FEV1 2.9 L 102% predicted, total lung capacity 6.50 L 97% predicted, DLCO 15.72 66% predicted  Bronchoscopy: 2018 Normal   Polysomnogram: 07/2017 AHI 24, O2 saturation average low 84%  Ambulatory oximetry: November 2023 walked 500 feet on room air in clinic and O2 saturation remained above 92%     Assessment & Plan:  Centrilobular emphysema (HCC)  Chronic respiratory failure with hypoxia ([place])  Esophageal dysphagia  Aspiration pneumonia of right lower lobe due to regurgitated food [facility])  Discussion: This has been a stable interval for [place].  He has recovered well from pneumonia.  Trelegy is doing well for him.  Aside from a slight decrease in the DLCO compared to the 2017 study all other parameters had not changed significantly.  Plan: COPD without exacerbation, Gold grade A: Continue Trelegy 1 puff daily no matter how you feel Continue albuterol as needed for chest tightness wheezing or shortness of breath Stay physically active, exercise regularly Practice good hand hygiene  Chronic respiratory failure with hypoxemia after having pneumonia earlier in the  year: resolved Your oxygen level while walking today in clinic was normal  Gastroesophageal reflux as seen on barium esophagram: Keep follow-up visit with Dr. Verl Blalock on December 6  We will see you back in 6 months or sooner if needed   Immunization History  Administered Date(s) Administered   Fluad Quad(high Dose 65+) 09/14/2019, 11/27/2020, 09/02/2022   Influenza Split 10/04/2012   Influenza Whole 08/16/2008, 08/01/2014   Influenza, High Dose Seasonal PF 09/16/2017   Influenza,inj,Quad PF,6+ Mos 10/11/2013, 10/18/2015, 08/24/2016, 09/29/2018   PFIZER(Purple Top)SARS-COV-2 Vaccination 01/08/2020, 02/01/2020   Pneumococcal Conjugate-13 11/18/2015   Pneumococcal Polysaccharide-23 06/13/2012   Td 07/01/2015        Current Outpatient Medications:    albuterol (VENTOLIN HFA) 108 (90 Base) MCG/ACT inhaler, Inhale 2 puffs into the lungs every 6 (six) hours as needed for wheezing or shortness of breath., Disp: 1 each, Rfl: 3   amLODipine (  NORVASC) 10 MG tablet, TAKE 1 TABLET BY MOUTH DAILY, Disp: 90 tablet, Rfl: 1   aspirin 81 MG chewable tablet, Chew 81 mg by mouth daily., Disp: , Rfl:    atorvastatin (LIPITOR) 40 MG tablet, TAKE ONE TABLET BY MOUTH EVERY DAY, Disp: 90 tablet, Rfl: 3   citalopram (CELEXA) 10 MG tablet, TAKE ONE TABLET BY MOUTH EVERY DAY, Disp: 90 tablet, Rfl: 1   dextromethorphan-guaiFENesin (MUCINEX DM) 30-600 MG 12hr tablet, Take 1 tablet by mouth 2 (two) times daily as needed for cough., Disp: 30 tablet, Rfl: 0   donepezil (ARICEPT) 5 MG tablet, Take 1 tablet (5 mg total) by mouth at bedtime., Disp: 90 tablet, Rfl: 1   dorzolamide-timolol (COSOPT) 22.3-6.8 MG/ML ophthalmic solution, 1 drop 2 (two) times daily., Disp: , Rfl:    Fluticasone-Umeclidin-Vilant (TRELEGY ELLIPTA) 100-62.5-25 MCG/ACT AEPB, Inhale 1 puff into the lungs daily., Disp: 1 each, Rfl: 0   latanoprost (XALATAN) 0.005 % ophthalmic solution, Place 1 drop into the left eye at bedtime., Disp: , Rfl:     metFORMIN (GLUCOPHAGE-XR) 500 MG 24 hr tablet, TAKE 3 TABLETS BY MOUTH DAILY, Disp: 270 tablet, Rfl: 3   nitroGLYCERIN (NITROSTAT) 0.4 MG SL tablet, Place 1 tablet (0.4 mg total) under the tongue every 5 (five) minutes as needed for chest pain., Disp: 25 tablet, Rfl: 6   pantoprazole (PROTONIX) 40 MG tablet, Take 1 tablet (40 mg total) by mouth daily., Disp: 30 tablet, Rfl: 2

## 2022-10-20 NOTE — Addendum Note (Signed)
Addended by: Irine Seal B on: 10/20/2022 04:37 PM   Modules accepted: Orders

## 2022-10-20 NOTE — Patient Instructions (Signed)
COPD without exacerbation, Gold grade A: Continue Trelegy 1 puff daily no matter how you feel Continue albuterol as needed for chest tightness wheezing or shortness of breath Stay physically active, exercise regularly Practice good hand hygiene  Chronic respiratory failure with hypoxemia after having pneumonia earlier in the year: We will check your oxygen level while walking today in clinic  Gastroesophageal reflux as seen on barium esophagram: Keep follow-up visit with Dr. Verl Blalock on December 6  We will see you back in 6 months or sooner if needed

## 2022-10-20 NOTE — Progress Notes (Signed)
PFT done today. 

## 2022-10-23 ENCOUNTER — Other Ambulatory Visit: Payer: Self-pay | Admitting: Family Medicine

## 2022-10-27 ENCOUNTER — Telehealth: Payer: Self-pay | Admitting: Family Medicine

## 2022-10-27 NOTE — Telephone Encounter (Signed)
Please write order to D/C Oxygen to fax to [facility].

## 2022-10-27 NOTE — Telephone Encounter (Signed)
Pt wife called in stated pt no longer needs oxygen . Will need PCP to notified [facility] to have them to pick up the oxygen tank to avoid other charge .  # [phone] Please Advise [phone]

## 2022-10-28 NOTE — Telephone Encounter (Signed)
Order to discontinue oxygen faxed to [facility] at [phone].  Left message for Mr. Maino that d/c orders have been faxed as requested.

## 2022-10-28 NOTE — Telephone Encounter (Signed)
Done - it looks like they wanted a call back.

## 2022-11-04 ENCOUNTER — Ambulatory Visit (INDEPENDENT_AMBULATORY_CARE_PROVIDER_SITE_OTHER): Payer: PPO | Admitting: Gastroenterology

## 2022-11-04 ENCOUNTER — Telehealth: Payer: Self-pay

## 2022-11-04 ENCOUNTER — Telehealth: Payer: Self-pay | Admitting: Cardiovascular Disease

## 2022-11-04 ENCOUNTER — Encounter: Payer: Self-pay | Admitting: Gastroenterology

## 2022-11-04 VITALS — BP 138/74 | HR 66 | Temp 97.7°F | Ht 68.0 in | Wt 206.0 lb

## 2022-11-04 DIAGNOSIS — R1319 Other dysphagia: Secondary | ICD-10-CM | POA: Diagnosis not present

## 2022-11-04 NOTE — Telephone Encounter (Signed)
Procedure clearance faxed to Dr Rockey Situ

## 2022-11-04 NOTE — Telephone Encounter (Signed)
Primary Cardiologist: None   Chart reviewed as part of pre-operative protocol coverage. Given past medical history and time since last visit, based on ACC/AHA guidelines, and per Dr. Rockey Situ, primary cardiologist, [name] is at acceptable risk for the planned procedure without further cardiovascular testing.    I will route this recommendation to the requesting party via Epic fax function and remove from pre-op pool.   Please call with questions.   Lenna Sciara, NP 11/04/2022, 1:12 PM

## 2022-11-04 NOTE — Addendum Note (Signed)
Addended by: Lurlean Nanny on: 11/04/2022 09:37 AM   Modules accepted: Orders

## 2022-11-04 NOTE — Progress Notes (Signed)
Primary Care Physician: [name], MD  Primary Gastroenterologist:  Dr. Lucilla Lame  Chief Complaint  Patient presents with   Follow-up    Diagnostic study    HPI: [name] is a 76 y.o. male here who has a history of reflux disease and was recently in the hospital for a multifocal pneumonia with aspiration.  The patient had a barium swallow that showed:  IMPRESSION: No aspiration observed. Prominent cricopharyngeus. Mild dysmotility. Severe reflux to the level of the cervical esophagus. Small sliding type hiatal hernia. Barium tablet passed normally.  The patient also underwent a CT scan on admission for the shortness of breath and was found to have a CT scan showing:  IMPRESSION: 1. Patchy areas of nodularity with surrounding ground-glass opacity in the RIGHT lower lobe and RIGHT middle lobe in particular are superimposed on background changes of pulmonary emphysema. Findings are most suggestive of multifocal pneumonia, perhaps related to aspiration. Discrete nodularity is present in addition to tree-in-bud opacities. Would include atypical infection including nontuberculous in tuberculous mycobacterial infection in the differential. Would also suggest blood cultures to exclude the possibility of an atypical presentation of septic emboli and include follow-up as outlined previously to ensure resolution of the above process. 2. Subcarinal nodal enlargement new from previous imaging, perhaps reactive but would suggest attention on follow-up. 3. Ultimately pulmonary consultation may be helpful for further evaluation given persistence and or recurrence when compared to imaging from March. 4. Stool like material in the esophagus. Swallow evaluation and or dedicated esophageal evaluation may be helpful. More focal area in the distal esophagus does not allow for exclusion of distal esophageal neoplasm or stricture. 5. Three-vessel coronary artery disease. 6. Small  hiatal hernia.  The patient reports that he is feeling fine at the present time without any shortness of breath or swallowing issues.  Past Medical History:  Diagnosis Date   Aortic transection 2005   Traumatic after a fall from a second floor. s/p repair at [facility]   Basal cell carcinoma    CAD (coronary artery disease) 1992   a. 1992 Acute anterior MI, thrombolytic therapy-->cath reportedly w/o significant CAD; b. 08/2016 MV: EF 57%, hypertensive respons, no ischemia/infarct; c. 04/2017 CTA chest w/ coronary Ca2+.   Depression    Diastolic dysfunction    a. 04/2017 Echo: EF 60-65%, Gr1 DD, nl RV fxn.   Erectile dysfunction    Fall 2005   fell off house: torn aorta, clavicle fracture, rib fracture, vertebral fractures, lung contusion, coma x 2 weeks   Fracture of femoral neck, left ([place]) 08/09/2014   ORIF, Dr. Marry Guan   Fracture of radial neck, left, closed 08/09/2014   Gallstone pancreatitis    Gastric ulcer    History of ATN 08/09/2014   [facility], 2 days of dialysis (ARF)   Hyperlipidemia    Statin with joint pain    Hypertension    Obesity    Peripheral vascular disease ([place])    Atherosclerotic:R renal artery stenosis   Type II diabetes mellitus ([place])    Upper GI bleed    a. 04/2017 admit w/ melena and HGB down to 7 req prbc's; b. 04/2017 EGD: small HH, non-bleeding gastric & duod ulcers, non-bleeding erosive gastropathy-->PPI Rx.    Current Outpatient Medications  Medication Sig Dispense Refill   amLODipine (NORVASC) 10 MG tablet TAKE 1 TABLET BY MOUTH DAILY 90 tablet 1   aspirin 81 MG chewable tablet Chew 81 mg by mouth daily.     atorvastatin (  LIPITOR) 40 MG tablet TAKE ONE TABLET BY MOUTH EVERY DAY 90 tablet 3   citalopram (CELEXA) 10 MG tablet TAKE ONE TABLET BY MOUTH EVERY DAY 90 tablet 1   dextromethorphan-guaiFENesin (MUCINEX DM) 30-600 MG 12hr tablet Take 1 tablet by mouth 2 (two) times daily as needed for cough. 30 tablet 0   donepezil (ARICEPT) 5 MG tablet Take 1 tablet (5 mg  total) by mouth at bedtime. 90 tablet 1   dorzolamide-timolol (COSOPT) 22.3-6.8 MG/ML ophthalmic solution 1 drop 2 (two) times daily.     Fluticasone-Umeclidin-Vilant (TRELEGY ELLIPTA) 100-62.5-25 MCG/ACT AEPB Inhale 1 puff into the lungs daily. 28 each 0   latanoprost (XALATAN) 0.005 % ophthalmic solution Place 1 drop into the left eye at bedtime.     metFORMIN (GLUCOPHAGE-XR) 500 MG 24 hr tablet TAKE 3 TABLETS BY MOUTH DAILY 270 tablet 3   nitroGLYCERIN (NITROSTAT) 0.4 MG SL tablet Place 1 tablet (0.4 mg total) under the tongue every 5 (five) minutes as needed for chest pain. 25 tablet 6   pantoprazole (PROTONIX) 40 MG tablet Take 1 tablet (40 mg total) by mouth daily. 30 tablet 2   albuterol (VENTOLIN HFA) 108 (90 Base) MCG/ACT inhaler Inhale 2 puffs into the lungs every 6 (six) hours as needed for wheezing or shortness of breath. 1 each 3   No current facility-administered medications for this visit.    Allergies as of 11/04/2022 - Review Complete 11/04/2022  Allergen Reaction Noted   Contrast media [iodinated contrast media] Hives 05/19/2017   Penicillins Swelling and Other (See Comments) 08/16/2008   Xarelto [rivaroxaban] Other (See Comments) 05/02/2017   Clindamycin/lincomycin Rash 07/05/2015   Dye fdc red [red dye] Rash 05/24/2012   Lovenox [enoxaparin sodium] Rash 09/12/2014    ROS:  General: Negative for anorexia, weight loss, fever, chills, fatigue, weakness. ENT: Negative for hoarseness, difficulty swallowing , nasal congestion. CV: Negative for chest pain, angina, palpitations, dyspnea on exertion, peripheral edema.  Respiratory: Negative for dyspnea at rest, dyspnea on exertion, cough, sputum, wheezing.  GI: See history of present illness. GU:  Negative for dysuria, hematuria, urinary incontinence, urinary frequency, nocturnal urination.  Endo: Negative for unusual weight change.    Physical Examination:   BP 138/74 (BP Location: Left Arm, Patient Position: Sitting,  Cuff Size: Large)   Pulse 66   Temp 97.7 F (36.5 C) (Oral)   Ht '5\' 8"'$  (1.727 m)   Wt 206 lb (93.4 kg)   BMI 31.32 kg/m   General: Well-nourished, well-developed in no acute distress.  Eyes: No icterus. Conjunctivae pink. Neuro: Alert and oriented x 3.  Grossly intact. Skin: Warm and dry, no jaundice.   Psych: Alert and cooperative, normal mood and affect.  Labs:    Imaging Studies: No results found.  Assessment and Plan:   WESTEN DININO is a 76 y.o. y/o male who comes in today with a history of what appears to have been pneumonia with possible aspiration and retained food in the esophagus.  The CT scan also suggested a possible stricture at the distal esophagus with decreased motility.  The patient has been explained these findings and will be set up for an upper endoscopy with possible dilation.  The patient and his wife have been explained the plan and agrees with it.     Lucilla Lame, MD. Marval Regal    Note: This dictation was prepared with Dragon dictation along with smaller phrase technology. Any transcriptional errors that result from this process are unintentional.

## 2022-11-04 NOTE — Telephone Encounter (Signed)
   Pre-operative Risk Assessment    Patient Name: [name]  DOB: Dec 18, 1945 MRN: [id number]      Request for Surgical Clearance    Procedure:   EDG  Date of Surgery:  Clearance 11/26/22                                  Surgeon:  NOT INDICATED Surgeon's Group or Practice Name:  [facility] Phone number:  [phone] Fax number:  [phone]  Type of Clearance Requested:   - Medical    Type of Anesthesia:  General    Additional requests/questions:    Signed, Eli Phillips   11/04/2022, 11:09 AM

## 2022-11-04 NOTE — Telephone Encounter (Signed)
   Patient Name: [name]  DOB: 02-16-1946 MRN: [id number]  Primary Cardiologist: None  Chart reviewed as part of pre-operative protocol coverage. Given past medical history and time since last visit, based on ACC/AHA guidelines, and per Dr. Rockey Situ, primary cardiologist, [name] is at acceptable risk for the planned procedure without further cardiovascular testing.   I will route this recommendation to the requesting party via Epic fax function and remove from pre-op pool.  Please call with questions.  Lenna Sciara, NP 11/04/2022, 1:12 PM

## 2022-11-16 ENCOUNTER — Other Ambulatory Visit: Payer: Self-pay | Admitting: Family Medicine

## 2022-11-16 DIAGNOSIS — F0280 Dementia in other diseases classified elsewhere without behavioral disturbance: Secondary | ICD-10-CM

## 2022-11-17 NOTE — Progress Notes (Signed)
Colton Meno T. Shahil Speegle, MD, CAQ Sports Medicine [facility] at [facility] [address] [place], [address]  Phone: [phone]  FAX: [phone]  [name] - 76 y.o. male  MRN [id number]  Date of Birth: Feb 28, 1946  Date: 11/18/2022  PCP: Hannah Beat, MD  Referral: Hannah Beat, MD  Chief Complaint  Patient presents with   Cough    With green phlegm-Started about a week ago No Covid Test Concerned because patient is schedule for procedure next Thurday   Subjective:   [name] is a 76 y.o. very pleasant male patient with Body mass index is 31.04 kg/m. who presents with the following:  The patient presents with congestion.  History is significant for severe multifocal pneumonia requiring hospitalization in September of this year.  He also has some significant COPD.  Has a lot of mucous.  Does not feel short of breath at all.  Productive cough.  Does not feel like he is wheezing.  He is accompanied by his wife who also provides additional history.  No fever, chills, or body aches.  Started on some Mucinex.  He is here accompanied by his wife, who also provides additional history.  No dysphagia.  No odynophagia. He is scheduled for a esophageal dilation next week.   Review of Systems is noted in the HPI, as appropriate  Objective:   BP 120/62   Pulse 60   Temp 98 F (36.7 C) (Oral)   Ht 5\' 8"  (1.727 m)   Wt 204 lb 2 oz (92.6 kg)   SpO2 96%   BMI 31.04 kg/m    Gen: WDWN, NAD. Globally Non-toxic HEENT: Throat clear, w/o exudate, R TM clear, L TM - good landmarks, No fluid present. rhinnorhea.  MMM Frontal sinuses: NT Max sinuses: NT NECK: Anterior cervical  LAD is absent CV: RRR, No M/G/R, cap refill <2 sec PULM: Breathing comfortably in no respiratory distress. no wheezing, crackles, rhonchi   Laboratory and Imaging Data: Results for orders placed or performed in visit on 11/18/22  POC COVID-19  Result Value Ref  Range   SARS Coronavirus 2 Ag Negative Negative     Assessment and Plan:     ICD-10-CM   1. Acute bronchitis due to other specified organisms  J20.8     2. Nasal congestion  R09.81     3. Cough in adult  R05.9 POC COVID-19     With his severe history of COPD and recent hospitalization with multifocal pneumonia, feel that it is most prudent to place him on some doxycycline right now.  Medication Management during today's office visit: Meds ordered this encounter  Medications   doxycycline (VIBRA-TABS) 100 MG tablet    Sig: Take 1 tablet (100 mg total) by mouth 2 (two) times daily.    Dispense:  20 tablet    Refill:  0   Medications Discontinued During This Encounter  Medication Reason   pantoprazole (PROTONIX) 40 MG tablet Completed Course    Orders placed today for conditions managed today: Orders Placed This Encounter  Procedures   POC COVID-19    Disposition: No follow-ups on file.  Dragon [employer] One speech-to-text software was used for transcription in this dictation.  Possible transcriptional errors can occur using Animal nutritionist.   Signed,  Elpidio Galea. Tonya Wantz, MD   Outpatient Encounter Medications as of 11/18/2022  Medication Sig   amLODipine (NORVASC) 10 MG tablet TAKE 1 TABLET BY MOUTH DAILY   aspirin 81 MG  chewable tablet Chew 81 mg by mouth daily.   atorvastatin (LIPITOR) 40 MG tablet TAKE ONE TABLET BY MOUTH EVERY DAY   citalopram (CELEXA) 10 MG tablet TAKE ONE TABLET BY MOUTH EVERY DAY   dextromethorphan-guaiFENesin (MUCINEX DM) 30-600 MG 12hr tablet Take 1 tablet by mouth 2 (two) times daily as needed for cough.   donepezil (ARICEPT) 5 MG tablet TAKE 1 TABLET BY MOUTH AT BEDTIME   dorzolamide-timolol (COSOPT) 22.3-6.8 MG/ML ophthalmic solution 1 drop 2 (two) times daily.   doxycycline (VIBRA-TABS) 100 MG tablet Take 1 tablet (100 mg total) by mouth 2 (two) times daily.   Fluticasone-Umeclidin-Vilant (TRELEGY ELLIPTA) 100-62.5-25 MCG/ACT AEPB Inhale  1 puff into the lungs daily.   latanoprost (XALATAN) 0.005 % ophthalmic solution Place 1 drop into the left eye at bedtime.   metFORMIN (GLUCOPHAGE-XR) 500 MG 24 hr tablet TAKE 3 TABLETS BY MOUTH DAILY   nitroGLYCERIN (NITROSTAT) 0.4 MG SL tablet Place 1 tablet (0.4 mg total) under the tongue every 5 (five) minutes as needed for chest pain.   albuterol (VENTOLIN HFA) 108 (90 Base) MCG/ACT inhaler Inhale 2 puffs into the lungs every 6 (six) hours as needed for wheezing or shortness of breath.   [DISCONTINUED] pantoprazole (PROTONIX) 40 MG tablet Take 1 tablet (40 mg total) by mouth daily.   No facility-administered encounter medications on file as of 11/18/2022.

## 2022-11-18 ENCOUNTER — Encounter: Payer: Self-pay | Admitting: Family Medicine

## 2022-11-18 ENCOUNTER — Ambulatory Visit (INDEPENDENT_AMBULATORY_CARE_PROVIDER_SITE_OTHER): Payer: PPO | Admitting: Family Medicine

## 2022-11-18 VITALS — BP 120/62 | HR 60 | Temp 98.0°F | Ht 68.0 in | Wt 204.1 lb

## 2022-11-18 DIAGNOSIS — J208 Acute bronchitis due to other specified organisms: Secondary | ICD-10-CM

## 2022-11-18 DIAGNOSIS — R0981 Nasal congestion: Secondary | ICD-10-CM | POA: Diagnosis not present

## 2022-11-18 DIAGNOSIS — R059 Cough, unspecified: Secondary | ICD-10-CM | POA: Diagnosis not present

## 2022-11-18 LAB — POC COVID19 BINAXNOW: SARS Coronavirus 2 Ag: NEGATIVE

## 2022-11-18 MED ORDER — DOXYCYCLINE HYCLATE 100 MG PO TABS: 100.0000 mg | ORAL_TABLET | Freq: Two times a day (BID) | ORAL | 0 refills | Status: DC

## 2022-11-26 ENCOUNTER — Other Ambulatory Visit: Payer: Self-pay

## 2022-11-26 ENCOUNTER — Encounter: Payer: Self-pay | Admitting: Gastroenterology

## 2022-11-26 ENCOUNTER — Ambulatory Visit
Admission: RE | Admit: 2022-11-26 | Discharge: 2022-11-26 | Disposition: A | Discharge status: Home / Self Care | Payer: PPO | Attending: Gastroenterology | Admitting: Gastroenterology

## 2022-11-26 ENCOUNTER — Encounter
Admission: RE | Disposition: A | Discharge status: Home / Self Care | Payer: Self-pay | Source: Home / Self Care | Attending: Gastroenterology

## 2022-11-26 ENCOUNTER — Ambulatory Visit: Payer: PPO | Admitting: Certified Registered"

## 2022-11-26 DIAGNOSIS — Z87891 Personal history of nicotine dependence: Secondary | ICD-10-CM | POA: Diagnosis not present

## 2022-11-26 DIAGNOSIS — E669 Obesity, unspecified: Secondary | ICD-10-CM | POA: Diagnosis not present

## 2022-11-26 DIAGNOSIS — Z6830 Body mass index (BMI) 30.0-30.9, adult: Secondary | ICD-10-CM | POA: Insufficient documentation

## 2022-11-26 DIAGNOSIS — E785 Hyperlipidemia, unspecified: Secondary | ICD-10-CM | POA: Insufficient documentation

## 2022-11-26 DIAGNOSIS — G4730 Sleep apnea, unspecified: Secondary | ICD-10-CM | POA: Insufficient documentation

## 2022-11-26 DIAGNOSIS — R1319 Other dysphagia: Secondary | ICD-10-CM

## 2022-11-26 DIAGNOSIS — E1151 Type 2 diabetes mellitus with diabetic peripheral angiopathy without gangrene: Secondary | ICD-10-CM | POA: Diagnosis not present

## 2022-11-26 DIAGNOSIS — I2510 Atherosclerotic heart disease of native coronary artery without angina pectoris: Secondary | ICD-10-CM | POA: Insufficient documentation

## 2022-11-26 DIAGNOSIS — Z85828 Personal history of other malignant neoplasm of skin: Secondary | ICD-10-CM | POA: Insufficient documentation

## 2022-11-26 DIAGNOSIS — J449 Chronic obstructive pulmonary disease, unspecified: Secondary | ICD-10-CM | POA: Insufficient documentation

## 2022-11-26 DIAGNOSIS — F32A Depression, unspecified: Secondary | ICD-10-CM | POA: Insufficient documentation

## 2022-11-26 DIAGNOSIS — K449 Diaphragmatic hernia without obstruction or gangrene: Secondary | ICD-10-CM | POA: Diagnosis not present

## 2022-11-26 DIAGNOSIS — K222 Esophageal obstruction: Secondary | ICD-10-CM | POA: Diagnosis not present

## 2022-11-26 DIAGNOSIS — Z7984 Long term (current) use of oral hypoglycemic drugs: Secondary | ICD-10-CM | POA: Insufficient documentation

## 2022-11-26 DIAGNOSIS — R1310 Dysphagia, unspecified: Secondary | ICD-10-CM | POA: Diagnosis present

## 2022-11-26 DIAGNOSIS — I110 Hypertensive heart disease with heart failure: Secondary | ICD-10-CM | POA: Diagnosis not present

## 2022-11-26 DIAGNOSIS — I252 Old myocardial infarction: Secondary | ICD-10-CM | POA: Insufficient documentation

## 2022-11-26 DIAGNOSIS — I509 Heart failure, unspecified: Secondary | ICD-10-CM | POA: Insufficient documentation

## 2022-11-26 DIAGNOSIS — Z79899 Other long term (current) drug therapy: Secondary | ICD-10-CM | POA: Diagnosis not present

## 2022-11-26 DIAGNOSIS — I5032 Chronic diastolic (congestive) heart failure: Secondary | ICD-10-CM | POA: Diagnosis not present

## 2022-11-26 HISTORY — PX: ESOPHAGOGASTRODUODENOSCOPY (EGD) WITH PROPOFOL: SHX5813

## 2022-11-26 LAB — GLUCOSE, CAPILLARY: Glucose-Capillary: 140 mg/dL — ABNORMAL HIGH (ref 70–99)

## 2022-11-26 SURGERY — ESOPHAGOGASTRODUODENOSCOPY (EGD) WITH PROPOFOL
Anesthesia: General

## 2022-11-26 MED ORDER — LIDOCAINE HCL (CARDIAC) PF 100 MG/5ML IV SOSY
PREFILLED_SYRINGE | INTRAVENOUS | Status: DC | PRN
  Administered 2022-11-26: 50 mg via INTRAVENOUS

## 2022-11-26 MED ORDER — PROPOFOL 10 MG/ML IV BOLUS
INTRAVENOUS | Status: DC | PRN
  Administered 2022-11-26: 50 mg via INTRAVENOUS

## 2022-11-26 MED ORDER — SODIUM CHLORIDE 0.9 % IV SOLN: INTRAVENOUS | Status: DC

## 2022-11-26 NOTE — Op Note (Signed)
[facility] Patient Name: [name] Procedure Date: 11/26/2022 8:53 AM MRN: [id number] Account #: [id number] Date of Birth: December 20, 1945 Admit Type: Outpatient Age: 76 Room: [facility] ENDO ROOM 4 Gender: Male Note Status: Finalized Instrument Name: Upper Endoscope [id number] Procedure:             Upper GI endoscopy Indications:           Dysphagia Providers:             Lucilla Lame MD, MD Referring MD:          Maud Deed. Copland MD, MD (Referring MD) Medicines:             Propofol per Anesthesia Complications:         No immediate complications. Procedure:             Pre-Anesthesia Assessment:                        - Prior to the procedure, a History and Physical was                         performed, and patient medications and allergies were                         reviewed. The patient's tolerance of previous                         anesthesia was also reviewed. The risks and benefits                         of the procedure and the sedation options and risks                         were discussed with the patient. All questions were                         answered, and informed consent was obtained. Prior                         Anticoagulants: The patient has taken no anticoagulant                         or antiplatelet agents. ASA Grade Assessment: II - A                         patient with mild systemic disease. After reviewing                         the risks and benefits, the patient was deemed in                         satisfactory condition to undergo the procedure.                        After obtaining informed consent, the endoscope was                         passed under direct vision. Throughout the procedure,  the patient's blood pressure, pulse, and oxygen                         saturations were monitored continuously. The Endoscope                         was introduced through the mouth, and advanced  to the                         second part of duodenum. The upper GI endoscopy was                         accomplished without difficulty. The patient tolerated                         the procedure well. Findings:      A small hiatal hernia was present.      The stomach was normal.      The examined duodenum was normal.      A TTS dilator was passed through the scope. Dilation with a 15-16.5-18       mm balloon dilator was performed to 18 mm in the entire esophagus. Impression:            - Small hiatal hernia.                        - Normal stomach.                        - Normal examined duodenum.                        - Dilation performed in the entire esophagus.                        - No specimens collected. Recommendation:        - Discharge patient to home.                        - Resume previous diet.                        - Continue present medications. Procedure Code(s):     --- Professional ---                        [phone], Esophagogastroduodenoscopy, flexible,                         transoral; with transendoscopic balloon dilation of                         esophagus (less than 30 mm diameter) Diagnosis Code(s):     --- Professional ---                        R13.10, Dysphagia, unspecified CPT copyright 2022 American Medical Association. All rights reserved. The codes documented in this report are preliminary and upon coder review may  be revised to meet current compliance requirements. Lucilla Lame MD, MD 11/26/2022 9:10:54 AM This report has been signed electronically. Number of Addenda: 0 Note Initiated On:  11/26/2022 8:53 AM Estimated Blood Loss:  Estimated blood loss: none.      [facility]

## 2022-11-26 NOTE — Anesthesia Procedure Notes (Signed)
Procedure Name: MAC Date/Time: 11/26/2022 9:00 AM  Performed by: Jerrye Noble, CRNAPre-anesthesia Checklist: Patient identified, Emergency Drugs available, Suction available and Patient being monitored Patient Re-evaluated:Patient Re-evaluated prior to induction Oxygen Delivery Method: Nasal cannula

## 2022-11-26 NOTE — Anesthesia Preprocedure Evaluation (Signed)
Anesthesia Evaluation  Patient identified by MRN, date of birth, ID band Patient awake    Reviewed: Allergy & Precautions, NPO status , Patient's Chart, lab work & pertinent test results  History of Anesthesia Complications Negative for: history of anesthetic complications  Airway Mallampati: III  TM Distance: >3 FB Neck ROM: full    Dental  (+) Lower Dentures, Upper Dentures   Pulmonary sleep apnea , COPD, former smoker   Pulmonary exam normal        Cardiovascular hypertension, (-) angina + CAD, + Peripheral Vascular Disease and +CHF  Normal cardiovascular exam     Neuro/Psych  PSYCHIATRIC DISORDERS  Depression   Dementia negative neurological ROS     GI/Hepatic negative GI ROS, Neg liver ROS,,,  Endo/Other  negative endocrine ROSdiabetes    Renal/GU negative Renal ROS  negative genitourinary   Musculoskeletal   Abdominal   Peds  Hematology negative hematology ROS (+)   Anesthesia Other Findings Past Medical History: 2005: Aortic transection     Comment:  Traumatic after a fall from a second floor. s/p repair               [facility] No date: Basal cell carcinoma 1992: CAD (coronary artery disease)     Comment:  a. 1992 Acute anterior MI, thrombolytic therapy-->cath               reportedly w/o significant CAD; b. 08/2016 MV: EF 57%,               hypertensive respons, no ischemia/infarct; c. 04/2017 CTA               chest w/ coronary Ca2+. No date: Depression No date: Diastolic dysfunction     Comment:  a. 04/2017 Echo: EF 60-65%, Gr1 DD, nl RV fxn. No date: Erectile dysfunction 2005: Fall     Comment:  fell off house: torn aorta, clavicle fracture, rib               fracture, vertebral fractures, lung contusion, coma x 2               weeks 08/09/2014: Fracture of femoral neck, left ([facility])     Comment:  ORIF, Dr. Marry Guan 08/09/2014: Fracture of radial neck, left, closed No date: Gallstone pancreatitis No  date: Gastric ulcer 08/09/2014: History of ATN     Comment:  [facility], 2 days of dialysis (ARF) No date: Hyperlipidemia     Comment:  Statin with joint pain  No date: Hypertension No date: Obesity No date: Peripheral vascular disease ([place])     Comment:  Atherosclerotic:R renal artery stenosis No date: Type II diabetes mellitus ([place]) No date: Upper GI bleed     Comment:  a. 04/2017 admit w/ melena and HGB down to 7 req prbc's;               b. 04/2017 EGD: small HH, non-bleeding gastric & duod               ulcers, non-bleeding erosive gastropathy-->PPI Rx.  Past Surgical History: 08/1991: CARDIAC CATHETERIZATION     Comment:  50 % mid-Lad stenosis with clot, 25-505 second marginal No date: CARDIOVASCULAR SURGERY     Comment:  with ruptured Aorta, Dr. Camila Li, [facility]  No date: CHOLECYSTECTOMY 05/03/2017: ESOPHAGOGASTRODUODENOSCOPY (EGD) WITH PROPOFOL; N/A     Comment:  Procedure: ESOPHAGOGASTRODUODENOSCOPY (EGD) WITH               PROPOFOL;  Surgeon:  Lucilla Lame, MD;  Location: [facility]               ENDOSCOPY;  Service: Endoscopy;  Laterality: N/A; No date: KNEE SURGERY     Comment:  Right  08/12/2014: ORIF HIP FRACTURE; Left     Comment:  Dr. Marry Guan No date: THORACOTOMY     Comment:  thoracic aorta repair  No date: TRACHEOSTOMY     Comment:  s/p reversal 05/27/2017: VIDEO BRONCHOSCOPY; Bilateral     Comment:  Procedure: VIDEO BRONCHOSCOPY WITHOUT FLUORO;  Surgeon:               Juanito Doom, MD;  Location: [facility] ENDOSCOPY;                Service: Cardiopulmonary;  Laterality: Bilateral;  BMI    Body Mass Index: 30.56 kg/m      Reproductive/Obstetrics negative OB ROS                             Anesthesia Physical Anesthesia Plan  ASA: 3  Anesthesia Plan: General   Post-op Pain Management: Minimal or no pain anticipated   Induction: Intravenous  PONV Risk Score and Plan: Propofol infusion and TIVA  Airway Management Planned: Natural Airway and  Nasal Cannula  Additional Equipment:   Intra-op Plan:   Post-operative Plan:   Informed Consent: I have reviewed the patients History and Physical, chart, labs and discussed the procedure including the risks, benefits and alternatives for the proposed anesthesia with the patient or authorized representative who has indicated his/her understanding and acceptance.     Dental Advisory Given  Plan Discussed with: Anesthesiologist, CRNA and Surgeon  Anesthesia Plan Comments: (Patient consented for risks of anesthesia including but not limited to:  - adverse reactions to medications - risk of airway placement if required - damage to eyes, teeth, lips or other oral mucosa - nerve damage due to positioning  - sore throat or hoarseness - Damage to heart, brain, nerves, lungs, other parts of body or loss of life  Patient voiced understanding.)       Anesthesia Quick Evaluation

## 2022-11-26 NOTE — Anesthesia Postprocedure Evaluation (Signed)
Anesthesia Post Note  Patient: [name]  Procedure(s) Performed: ESOPHAGOGASTRODUODENOSCOPY (EGD) WITH PROPOFOL  Patient location during evaluation: Endoscopy Anesthesia Type: General Level of consciousness: awake and alert Pain management: pain level controlled Vital Signs Assessment: post-procedure vital signs reviewed and stable Respiratory status: spontaneous breathing, nonlabored ventilation, respiratory function stable and patient connected to nasal cannula oxygen Cardiovascular status: blood pressure returned to baseline and stable Postop Assessment: no apparent nausea or vomiting Anesthetic complications: no  No notable events documented.   Last Vitals:  Vitals:   11/26/22 0911 11/26/22 0931  BP: 110/71 139/76  Pulse:  (!) 55  Resp: 13   Temp: (!) 35.7 C   SpO2: 95% 99%    Last Pain:  Vitals:   11/26/22 0931  TempSrc:   PainSc: 0-No pain                 Ilene Qua

## 2022-11-26 NOTE — H&P (Signed)
Lucilla Lame, MD [address], [place] [address] Phone:[phone] Fax : [phone]  Primary Care Physician:  [name], MD Primary Gastroenterologist:  Dr. Allen Norris  Pre-Procedure History & Physical: HPI:  [name] is a 76 y.o. male is here for an endoscopy.   Past Medical History:  Diagnosis Date   Aortic transection 2005   Traumatic after a fall from a second floor. s/p repair at [facility]   Basal cell carcinoma    CAD (coronary artery disease) 1992   a. 1992 Acute anterior MI, thrombolytic therapy-->cath reportedly w/o significant CAD; b. 08/2016 MV: EF 57%, hypertensive respons, no ischemia/infarct; c. 04/2017 CTA chest w/ coronary Ca2+.   Depression    Diastolic dysfunction    a. 04/2017 Echo: EF 60-65%, Gr1 DD, nl RV fxn.   Erectile dysfunction    Fall 2005   fell off house: torn aorta, clavicle fracture, rib fracture, vertebral fractures, lung contusion, coma x 2 weeks   Fracture of femoral neck, left ([place]) 08/09/2014   ORIF, Dr. Marry Guan   Fracture of radial neck, left, closed 08/09/2014   Gallstone pancreatitis    Gastric ulcer    History of ATN 08/09/2014   [facility], 2 days of dialysis ([facility])   Hyperlipidemia    Statin with joint pain    Hypertension    Obesity    Peripheral vascular disease ([place])    Atherosclerotic:R renal artery stenosis   Type II diabetes mellitus ([place])    Upper GI bleed    a. 04/2017 admit w/ melena and HGB down to 7 req prbc's; b. 04/2017 EGD: small HH, non-bleeding gastric & duod ulcers, non-bleeding erosive gastropathy-->PPI Rx.    Past Surgical History:  Procedure Laterality Date   CARDIAC CATHETERIZATION  08/1991   50 % mid-Lad stenosis with clot, 25-505 second marginal   CARDIOVASCULAR SURGERY     with ruptured Aorta, Dr. Camila Li, [place]     ESOPHAGOGASTRODUODENOSCOPY (EGD) WITH PROPOFOL N/A 05/03/2017   Procedure: ESOPHAGOGASTRODUODENOSCOPY (EGD) WITH PROPOFOL;  Surgeon: Lucilla Lame, MD;   Location: [facility] ENDOSCOPY;  Service: Endoscopy;  Laterality: N/A;   KNEE SURGERY     Right    ORIF HIP FRACTURE Left 08/12/2014   Dr. Marry Guan   THORACOTOMY     thoracic aorta repair    TRACHEOSTOMY     s/p reversal   VIDEO BRONCHOSCOPY Bilateral 05/27/2017   Procedure: VIDEO BRONCHOSCOPY WITHOUT FLUORO;  Surgeon: Juanito Doom, MD;  Location: [facility] ENDOSCOPY;  Service: Cardiopulmonary;  Laterality: Bilateral;    Prior to Admission medications   Medication Sig Start Date End Date Taking? Authorizing Provider  amLODipine (NORVASC) 10 MG tablet TAKE 1 TABLET BY MOUTH DAILY 09/29/22  Yes Copland, Frederico Hamman, MD  aspirin 81 MG chewable tablet Chew 81 mg by mouth daily.   Yes [provider]  atorvastatin (LIPITOR) 40 MG tablet TAKE ONE TABLET BY MOUTH EVERY DAY 04/20/22  Yes Bedsole, Amy E, MD  citalopram (CELEXA) 10 MG tablet TAKE ONE TABLET BY MOUTH EVERY DAY 09/29/22  Yes Copland, Frederico Hamman, MD  dextromethorphan-guaiFENesin (MUCINEX DM) 30-600 MG 12hr tablet Take 1 tablet by mouth 2 (two) times daily as needed for cough. 08/07/22  Yes Lorella Nimrod, MD  donepezil (ARICEPT) 5 MG tablet TAKE 1 TABLET BY MOUTH AT BEDTIME 11/16/22  Yes Copland, Frederico Hamman, MD  dorzolamide-timolol (COSOPT) 22.3-6.8 MG/ML ophthalmic solution 1 drop 2 (two) times daily. 07/09/21  Yes [provider]  doxycycline (VIBRA-TABS) 100  MG tablet Take 1 tablet (100 mg total) by mouth 2 (two) times daily. 11/18/22  Yes Copland, Frederico Hamman, MD  Fluticasone-Umeclidin-Vilant (TRELEGY ELLIPTA) 100-62.5-25 MCG/ACT AEPB Inhale 1 puff into the lungs daily. 10/20/22  Yes Juanito Doom, MD  latanoprost (XALATAN) 0.005 % ophthalmic solution Place 1 drop into the left eye at bedtime. 07/28/22  Yes [provider]  metFORMIN (GLUCOPHAGE-XR) 500 MG 24 hr tablet TAKE 3 TABLETS BY MOUTH DAILY 04/20/22  Yes Bedsole, Amy E, MD  nitroGLYCERIN (NITROSTAT) 0.4 MG SL tablet Place 1 tablet (0.4 mg total) under the tongue every 5  (five) minutes as needed for chest pain. 05/19/17  Yes Theora Gianotti, NP  albuterol (VENTOLIN HFA) 108 (90 Base) MCG/ACT inhaler Inhale 2 puffs into the lungs every 6 (six) hours as needed for wheezing or shortness of breath. 03/06/21 10/20/22  [name], MD    Allergies as of 11/04/2022 - Review Complete 11/04/2022  Allergen Reaction Noted   Contrast media [iodinated contrast media] Hives 05/19/2017   Penicillins Swelling and Other (See Comments) 08/16/2008   Xarelto [rivaroxaban] Other (See Comments) 05/02/2017   Clindamycin/lincomycin Rash 07/05/2015   Dye fdc red [red dye] Rash 05/24/2012   Lovenox [enoxaparin sodium] Rash 09/12/2014    Family History  Problem Relation Age of Onset   Dementia Mother 75   Heart attack Father 73   Diabetes Brother 79   Heart attack Brother    Diabetes Brother    Colon cancer Neg Hx    Stomach cancer Neg Hx    Esophageal cancer Neg Hx    Rectal cancer Neg Hx     Social History   Socioeconomic History   Marital status: Married    Spouse name: Not on file   Number of children: Not on file   Years of education: Not on file   Highest education level: Not on file  Occupational History   Occupation: Build in Production manager: self employed  Tobacco Use   Smoking status: Former    Packs/day: 1.50    Years: 20.00    Total pack years: 30.00    Types: Cigarettes    Quit date: 09/13/1991    Years since quitting: 31.2   Smokeless tobacco: Never   Tobacco comments:    quit post MI  Vaping Use   Vaping Use: Never used  Substance and Sexual Activity   Alcohol use: Yes    Comment: 1-2 cansof beer per month   Drug use: No   Sexual activity: Not on file  Other Topics Concern   Not on file  Social History Narrative   No regular exercise    Social Determinants of Health   Financial Resource Strain: Low Risk  (01/14/2022)   Overall Financial Resource Strain (CARDIA)    Difficulty of Paying Living Expenses: Not hard at  all  Food Insecurity: No Food Insecurity (08/05/2022)   Hunger Vital Sign    Worried About Running Out of Food in the Last Year: Never true    Ran Out of Food in the Last Year: Never true  Transportation Needs: No Transportation Needs (08/05/2022)   PRAPARE - Hydrologist (Medical): No    Lack of Transportation (Non-Medical): No  Physical Activity: Insufficiently Active (01/14/2022)   Exercise Vital Sign    Days of Exercise per Week: 3 days    Minutes of Exercise per Session: 30 min  Stress: No Stress Concern Present (01/14/2022)   [place]  [employer] of [employer]ational Health - Occupational Stress Questionnaire    Feeling of Stress : Not at all  Social Connections: Moderately Integrated (01/14/2022)   Social Connection and Isolation Panel [NHANES]    Frequency of Communication with Friends and Family: More than three times a week    Frequency of Social Gatherings with Friends and Family: More than three times a week    Attends Religious Services: More than 4 times per year    Active Member of [employer] or Organizations: No    Attends Archivist Meetings: Never    Marital Status: Married  Human resources officer Violence: Not At Risk (08/05/2022)   Humiliation, Afraid, Rape, and Kick questionnaire    Fear of Current or Ex-Partner: No    Emotionally Abused: No    Physically Abused: No    Sexually Abused: No    Review of Systems: See HPI, otherwise negative ROS  Physical Exam: BP (!) 151/83   Pulse (!) 54   Temp (!) 96.9 F (36.1 C) (Temporal)   Resp 18   Ht '5\' 8"'$  (1.727 m)   Wt 91.2 kg   SpO2 94%   BMI 30.56 kg/m  General:   Alert,  pleasant and cooperative in NAD Head:  Normocephalic and atraumatic. Neck:  Supple; no masses or thyromegaly. Lungs:  Clear throughout to auscultation.    Heart:  Regular rate and rhythm. Abdomen:  Soft, nontender and nondistended. Normal bowel sounds, without guarding, and without rebound.   Neurologic:  Alert and  oriented  x4;  grossly normal neurologically.  Impression/Plan: [name] is here for an endoscopy to be performed for dysphagia  Risks, benefits, limitations, and alternatives regarding  endoscopy have been reviewed with the patient.  Questions have been answered.  All parties agreeable.   Lucilla Lame, MD  11/26/2022, 8:48 AM

## 2022-11-26 NOTE — Transfer of Care (Signed)
Immediate Anesthesia Transfer of Care Note  Patient: [name]  Procedure(s) Performed: ESOPHAGOGASTRODUODENOSCOPY (EGD) WITH PROPOFOL  Patient Location: PACU and Endoscopy Unit  Anesthesia Type:General  Level of Consciousness: drowsy and patient cooperative  Airway & Oxygen Therapy: Patient Spontanous Breathing  Post-op Assessment: Report given to RN and Post -op Vital signs reviewed and stable  Post vital signs: Reviewed and stable  Last Vitals:  Vitals Value Taken Time  BP 110/71 11/26/22 0911  Temp    Pulse 57 11/26/22 0911  Resp 13 11/26/22 0911  SpO2 95 % 11/26/22 0911  Vitals shown include unvalidated device data.  Last Pain:  Vitals:   11/26/22 0833  TempSrc: Temporal  PainSc: 0-No pain         Complications: No notable events documented.

## 2022-11-27 ENCOUNTER — Encounter: Payer: Self-pay | Admitting: Gastroenterology

## 2022-12-08 ENCOUNTER — Other Ambulatory Visit: Payer: Self-pay | Admitting: Pulmonary Disease

## 2023-01-15 ENCOUNTER — Ambulatory Visit (INDEPENDENT_AMBULATORY_CARE_PROVIDER_SITE_OTHER): Payer: PPO

## 2023-01-15 VITALS — Ht 68.0 in | Wt 200.0 lb

## 2023-01-15 DIAGNOSIS — Z0000 Encounter for general adult medical examination without abnormal findings: Secondary | ICD-10-CM | POA: Diagnosis not present

## 2023-01-15 NOTE — Patient Instructions (Signed)
[name] , Thank you for taking time to come for your Medicare Wellness Visit. I appreciate your ongoing commitment to your health goals. Please review the following plan we discussed and let me know if I can assist you in the future.   These are the goals we discussed:  Goals      DIET - INCREASE WATER INTAKE     Starting 02/08/2019, I will continue to drink at least 1 gallon of water daily.      Patient Stated     06/04/2020, I will maintain and continue medications as prescribed.      Patient Stated     Would like to maintain current routine      Patient Stated     01/15/2023, wants to continue losing weight        This is a list of the screening recommended for you and due dates:  Health Maintenance  Topic Date Due   Zoster (Shingles) Vaccine (1 of 2) Never done   Complete foot exam   07/21/2014   COVID-19 Vaccine (3 - Pfizer risk series) 02/29/2020   Yearly kidney health urinalysis for diabetes  06/05/2021   Colon Cancer Screening  07/20/2022   Hemoglobin A1C  08/15/2022   Eye exam for diabetics  05/20/2023   Yearly kidney function blood test for diabetes  08/18/2023   Medicare Annual Wellness Visit  01/16/2024   DTaP/Tdap/Td vaccine (2 - Tdap) 06/30/2025   Pneumonia Vaccine  Completed   Flu Shot  Completed   Hepatitis C Screening: USPSTF Recommendation to screen - Ages 18-79 yo.  Completed   HPV Vaccine  Aged Out    Advanced directives: Please bring a copy of your POA (Power of Pierz) and/or Living Will to your next appointment.   Conditions/risks identified: none  Next appointment: Follow up in one year for your annual wellness visit.   Preventive Care 48 Years and Older, Male  Preventive care refers to lifestyle choices and visits with your health care provider that can promote health and wellness. What does preventive care include? A yearly physical exam. This is also called an annual well check. Dental exams once or twice a year. Routine eye exams. Ask your  health care provider how often you should have your eyes checked. Personal lifestyle choices, including: Daily care of your teeth and gums. Regular physical activity. Eating a healthy diet. Avoiding tobacco and drug use. Limiting alcohol use. Practicing safe sex. Taking low doses of aspirin every day. Taking vitamin and mineral supplements as recommended by your health care provider. What happens during an annual well check? The services and screenings done by your health care provider during your annual well check will depend on your age, overall health, lifestyle risk factors, and family history of disease. Counseling  Your health care provider may ask you questions about your: Alcohol use. Tobacco use. Drug use. Emotional well-being. Home and relationship well-being. Sexual activity. Eating habits. History of falls. Memory and ability to understand (cognition). Work and work Statistician. Screening  You may have the following tests or measurements: Height, weight, and BMI. Blood pressure. Lipid and cholesterol levels. These may be checked every 5 years, or more frequently if you are over 6 years old. Skin check. Lung cancer screening. You may have this screening every year starting at age 85 if you have a 30-pack-year history of smoking and currently smoke or have quit within the past 15 years. Fecal occult blood test (FOBT) of the stool. You may  have this test every year starting at age 56. Flexible sigmoidoscopy or colonoscopy. You may have a sigmoidoscopy every 5 years or a colonoscopy every 10 years starting at age 90. Prostate cancer screening. Recommendations will vary depending on your family history and other risks. Hepatitis C blood test. Hepatitis B blood test. Sexually transmitted disease (STD) testing. Diabetes screening. This is done by checking your blood sugar (glucose) after you have not eaten for a while (fasting). You may have this done every 1-3  years. Abdominal aortic aneurysm (AAA) screening. You may need this if you are a current or former smoker. Osteoporosis. You may be screened starting at age 69 if you are at high risk. Talk with your health care provider about your test results, treatment options, and if necessary, the need for more tests. Vaccines  Your health care provider may recommend certain vaccines, such as: Influenza vaccine. This is recommended every year. Tetanus, diphtheria, and acellular pertussis (Tdap, Td) vaccine. You may need a Td booster every 10 years. Zoster vaccine. You may need this after age 8. Pneumococcal 13-valent conjugate (PCV13) vaccine. One dose is recommended after age 15. Pneumococcal polysaccharide (PPSV23) vaccine. One dose is recommended after age 23. Talk to your health care provider about which screenings and vaccines you need and how often you need them. This information is not intended to replace advice given to you by your health care provider. Make sure you discuss any questions you have with your health care provider. Document Released: 12/13/2015 Document Revised: 08/05/2016 Document Reviewed: 09/17/2015 Elsevier Interactive Patient Education  2017 [place] Prevention in the Home Falls can cause injuries. They can happen to people of all ages. There are many things you can do to make your home safe and to help prevent falls. What can I do on the outside of my home? Regularly fix the edges of walkways and driveways and fix any cracks. Remove anything that might make you trip as you walk through a door, such as a raised step or threshold. Trim any bushes or trees on the path to your home. Use bright outdoor lighting. Clear any walking paths of anything that might make someone trip, such as rocks or tools. Regularly check to see if handrails are loose or broken. Make sure that both sides of any steps have handrails. Any raised decks and porches should have guardrails on the  edges. Have any leaves, snow, or ice cleared regularly. Use sand or salt on walking paths during winter. Clean up any spills in your garage right away. This includes oil or grease spills. What can I do in the bathroom? Use night lights. Install grab bars by the toilet and in the tub and shower. Do not use towel bars as grab bars. Use non-skid mats or decals in the tub or shower. If you need to sit down in the shower, use a plastic, non-slip stool. Keep the floor dry. Clean up any water that spills on the floor as soon as it happens. Remove soap buildup in the tub or shower regularly. Attach bath mats securely with double-sided non-slip rug tape. Do not have throw rugs and other things on the floor that can make you trip. What can I do in the bedroom? Use night lights. Make sure that you have a light by your bed that is easy to reach. Do not use any sheets or blankets that are too big for your bed. They should not hang down onto the floor. Have a firm  chair that has side arms. You can use this for support while you get dressed. Do not have throw rugs and other things on the floor that can make you trip. What can I do in the kitchen? Clean up any spills right away. Avoid walking on wet floors. Keep items that you use a lot in easy-to-reach places. If you need to reach something above you, use a strong step stool that has a grab bar. Keep electrical cords out of the [place]. Do not use floor polish or wax that makes floors slippery. If you must use wax, use non-skid floor wax. Do not have throw rugs and other things on the floor that can make you trip. What can I do with my stairs? Do not leave any items on the stairs. Make sure that there are handrails on both sides of the stairs and use them. Fix handrails that are broken or loose. Make sure that handrails are as long as the stairways. Check any carpeting to make sure that it is firmly attached to the stairs. Fix any carpet that is loose or  worn. Avoid having throw rugs at the top or bottom of the stairs. If you do have throw rugs, attach them to the floor with carpet tape. Make sure that you have a light switch at the top of the stairs and the bottom of the stairs. If you do not have them, ask someone to add them for you. What else can I do to help prevent falls? Wear shoes that: Do not have high heels. Have rubber bottoms. Are comfortable and fit you well. Are closed at the toe. Do not wear sandals. If you use a stepladder: Make sure that it is fully opened. Do not climb a closed stepladder. Make sure that both sides of the stepladder are locked into place. Ask someone to hold it for you, if possible. Clearly mark and make sure that you can see: Any grab bars or handrails. First and last steps. Where the edge of each step is. Use tools that help you move around (mobility aids) if they are needed. These include: Canes. Walkers. Scooters. Crutches. Turn on the lights when you go into a dark area. Replace any light bulbs as soon as they burn out. Set up your furniture so you have a clear path. Avoid moving your furniture around. If any of your floors are uneven, fix them. If there are any pets around you, be aware of where they are. Review your medicines with your doctor. Some medicines can make you feel dizzy. This can increase your chance of falling. Ask your doctor what other things that you can do to help prevent falls. This information is not intended to replace advice given to you by your health care provider. Make sure you discuss any questions you have with your health care provider. Document Released: 09/12/2009 Document Revised: 04/23/2016 Document Reviewed: 12/21/2014 Elsevier Interactive Patient Education  2017 [employer].

## 2023-01-15 NOTE — Progress Notes (Signed)
I connected with  [name] on 01/15/23 by a audio enabled telemedicine application and verified that I am speaking with the correct person using two identifiers.  Patient Location: Home  Provider Location: Office/Clinic  I discussed the limitations of evaluation and management by telemedicine. The patient expressed understanding and agreed to proceed.  Subjective:   [name] is a 77 y.o. male who presents for Medicare Annual/Subsequent preventive examination.  Review of Systems     Cardiac Risk Factors include: advanced age (>29mn, >>84women);diabetes mellitus;dyslipidemia;hypertension;male gender;obesity (BMI >30kg/m2)     Objective:    Today's Vitals   01/15/23 0841  Weight: 200 lb (90.7 kg)  Height: 5' 8"$  (1.727 m)   Body mass index is 30.41 kg/m.     01/15/2023    8:44 AM 11/26/2022    8:29 AM 08/05/2022    5:58 AM 01/14/2022    9:49 AM 02/22/2021    1:07 PM 02/22/2021    1:05 PM 02/22/2021    9:08 AM  Advanced Directives  Does Patient Have a Medical Advance Directive? Yes Yes Yes Yes  Yes Yes  Type of AParamedicof AWakemanLiving will H[place]amping GroundLiving will Living will;Healthcare Power of [place] will  Living will;Healthcare Power of [place]. [place] Does patient want to make changes to medical advance directive?   No - Patient declined Yes (MAU/Ambulatory/Procedural Areas - Information given)  No - Patient declined   Copy of HEdinain Chart? No - copy requested No - copy requested No - copy requested  No - copy requested      Current Medications (verified) Outpatient Encounter Medications as of 01/15/2023  Medication Sig   amLODipine (NORVASC) 10 MG tablet TAKE 1 TABLET BY MOUTH DAILY   aspirin 81 MG chewable tablet Chew 81 mg by mouth daily.   atorvastatin (LIPITOR) 40 MG tablet TAKE ONE TABLET BY MOUTH EVERY DAY   citalopram (CELEXA) 10  MG tablet TAKE ONE TABLET BY MOUTH EVERY DAY   dextromethorphan-guaiFENesin (MUCINEX DM) 30-600 MG 12hr tablet Take 1 tablet by mouth 2 (two) times daily as needed for cough.   donepezil (ARICEPT) 5 MG tablet TAKE 1 TABLET BY MOUTH AT BEDTIME   dorzolamide-timolol (COSOPT) 22.3-6.8 MG/ML ophthalmic solution 1 drop 2 (two) times daily.   Fluticasone-Umeclidin-Vilant (TRELEGY ELLIPTA) 100-62.5-25 MCG/ACT AEPB INHALE 1 PUFF INTO THE LUNGS DAILY AS DIRECTED   latanoprost (XALATAN) 0.005 % ophthalmic solution Place 1 drop into the left eye at bedtime.   metFORMIN (GLUCOPHAGE-XR) 500 MG 24 hr tablet TAKE 3 TABLETS BY MOUTH DAILY   nitroGLYCERIN (NITROSTAT) 0.4 MG SL tablet Place 1 tablet (0.4 mg total) under the tongue every 5 (five) minutes as needed for chest pain.   albuterol (VENTOLIN HFA) 108 (90 Base) MCG/ACT inhaler Inhale 2 puffs into the lungs every 6 (six) hours as needed for wheezing or shortness of breath.   doxycycline (VIBRA-TABS) 100 MG tablet Take 1 tablet (100 mg total) by mouth 2 (two) times daily. (Patient not taking: Reported on 01/15/2023)   No facility-administered encounter medications on file as of 01/15/2023.    Allergies (verified) Contrast media [iodinated contrast media], Penicillins, Xarelto [rivaroxaban], Clindamycin/lincomycin, Dye fdc red [red dye], and Lovenox [enoxaparin sodium]   History: Past Medical History:  Diagnosis Date   Aortic transection 2005   Traumatic after a fall from a second floor[facility]  Basal cell carcinoma  CAD (coronary artery disease) 1992   a. 1992 Acute anterior MI, thrombolytic therapy-->cath reportedly w/o significant CAD; b. 08/2016 MV: EF 57%, hypertensive respons, no ischemia/infarct; c. 04/2017 CTA chest w/ coronary Ca2+.   Depression    Diastolic dysfunction    a. 04/2017 Echo: EF 60-65%, Gr1 DD, nl RV fxn.   Erectile dysfunction    Fall 2005   fell off house: torn aorta, clavicle fracture, rib fracture, vertebral  fractures, lung contusion, coma x 2 weeks   Fracture of femoral neck, left ([place]) 08/09/2014   ORIF, Dr. Marry Guan   Fracture of radial neck, left, closed 08/09/2014   Gallstone pancreatitis    Gastric ulcer    History of ATN 08/09/2014   [facility], 2 days of dialysis (ARF)   Hyperlipidemia    Statin with joint pain    Hypertension    Obesity    Peripheral vascular disease ([place])    Atherosclerotic:R renal artery stenosis   Type II diabetes mellitus ([place])    Upper GI bleed    a. 04/2017 admit w/ melena and HGB down to 7 req prbc's; b. 04/2017 EGD: small HH, non-bleeding gastric & duod ulcers, non-bleeding erosive gastropathy-->PPI Rx.   Past Surgical History:  Procedure Laterality Date   CARDIAC CATHETERIZATION  08/1991   50 % mid-Lad stenosis with clot, 25-505 second marginal   CARDIOVASCULAR SURGERY     with ruptured Aorta, Dr. Camila Li, [place]     ESOPHAGOGASTRODUODENOSCOPY (EGD) WITH PROPOFOL N/A 05/03/2017   Procedure: ESOPHAGOGASTRODUODENOSCOPY (EGD) WITH PROPOFOL;  Surgeon: Lucilla Lame, MD;  Location: [facility] ENDOSCOPY;  Service: Endoscopy;  Laterality: N/A;   ESOPHAGOGASTRODUODENOSCOPY (EGD) WITH PROPOFOL N/A 11/26/2022   Procedure: ESOPHAGOGASTRODUODENOSCOPY (EGD) WITH PROPOFOL;  Surgeon: Lucilla Lame, MD;  Location[facility] ENDOSCOPY;  Service: Endoscopy;  Laterality: N/A;   KNEE SURGERY     Right    ORIF HIP FRACTURE Left 08/12/2014   Dr. Marry Guan   THORACOTOMY     thoracic aorta repair    TRACHEOSTOMY     s/p reversal   VIDEO BRONCHOSCOPY Bilateral 05/27/2017   Procedure: VIDEO BRONCHOSCOPY WITHOUT FLUORO;  Surgeon: Juanito Doom, MD;  Location: [facility] ENDOSCOPY;  Service: Cardiopulmonary;  Laterality: Bilateral;   Family History  Problem Relation Age of Onset   Dementia Mother 57   Heart attack Father 58   Diabetes Brother 30   Heart attack Brother    Diabetes Brother    Colon cancer Neg Hx    Stomach cancer Neg Hx    Esophageal cancer Neg Hx    Rectal cancer  Neg Hx    Social History   Socioeconomic History   Marital status: Married    Spouse name: Not on file   Number of children: Not on file   Years of education: Not on file   Highest education level: Not on file  Occupational History   Occupation: Build in Production manager: self employed  Tobacco Use   Smoking status: Former    Packs/day: 1.50    Years: 20.00    Total pack years: 30.00    Types: Cigarettes    Quit date: 09/13/1991    Years since quitting: 31.3   Smokeless tobacco: Never   Tobacco comments:    quit post MI  Vaping Use   Vaping Use: Never used  Substance and Sexual Activity   Alcohol use: Yes    Comment: 1-2 cansof beer per month   Drug use: No  Sexual activity: Not on file  Other Topics Concern   Not on file  Social History Narrative   No regular exercise    Social Determinants of Health   Financial Resource Strain: Low Risk  (01/15/2023)   Overall Financial Resource Strain (CARDIA)    Difficulty of Paying Living Expenses: Not hard at all  Food Insecurity: No Food Insecurity (01/15/2023)   Hunger Vital Sign    Worried About Running Out of Food in the Last Year: Never true    Ran Out of Food in the Last Year: Never true  Transportation Needs: No Transportation Needs (01/15/2023)   PRAPARE - Hydrologist (Medical): No    Lack of Transportation (Non-Medical): No  Physical Activity: Insufficiently Active (01/15/2023)   Exercise Vital Sign    Days of Exercise per Week: 1 day    Minutes of Exercise per Session: 10 min  Stress: No Stress Concern Present (01/15/2023)   [place]    Feeling of Stress : Not at all  Social Connections: Moderately Integrated (01/14/2022)   Social Connection and Isolation Panel [NHANES]    Frequency of Communication with Friends and Family: More than three times a week    Frequency of Social Gatherings with Friends and Family:  More than three times a week    Attends Religious Services: More than 4 times per year    Active Member of [employer] or Organizations: No    Attends Music therapist: Never    Marital Status: Married    Tobacco Counseling Counseling given: Not Answered Tobacco comments: quit post MI   Clinical Intake:  Pre-visit preparation completed: Yes  Pain : No/denies pain     Nutritional Status: BMI > 30  Obese Nutritional Risks: None Diabetes: No  How often do you need to have someone help you when you read instructions, pamphlets, or other written materials from your doctor or pharmacy?: 3 - Sometimes  Diabetic? Yes Nutrition Risk Assessment:  Has the patient had any N/V/D within the last 2 months?  No  Does the patient have any non-healing wounds?  No  Has the patient had any unintentional weight loss or weight gain?  No   Diabetes:  Is the patient diabetic?  Yes  If diabetic, was a CBG obtained today?  No  Did the patient bring in their glucometer from home?  No  How often do you monitor your CBG's? Once weekly.   Financial Strains and Diabetes Management:  Are you having any financial strains with the device, your supplies or your medication? No .  Does the patient want to be seen by Chronic Care Management for management of their diabetes?  No  Would the patient like to be referred to a Nutritionist or for Diabetic Management?  No   Diabetic Exams:  Diabetic Eye Exam: Completed 05/19/2022 Diabetic Foot Exam: Overdue, Pt has been advised about the importance in completing this exam. Pt is scheduled for diabetic foot exam on next appointment.   Interpreter Needed?: No  Information entered by :: NAllen LPN   Activities of Daily Living    01/15/2023    8:45 AM 01/11/2023   12:23 PM  In your present state of health, do you have any difficulty performing the following activities:  Hearing? 0 0  Vision? 0 0  Difficulty concentrating or making decisions? 1 1   Walking or climbing stairs? 0 0  Dressing or bathing? 0  0  Doing errands, shopping? 0 0  Preparing Food and eating ? N N  Using the Toilet? N N  In the past six months, have you accidently leaked urine? N N  Do you have problems with loss of bowel control? N N  Managing your Medications? N N  Managing your Finances? N N  Housekeeping or managing your Housekeeping? N N    Patient Care Team: [name], MD as PCP - General (Family Medicine) Sydnee Levans, MD as Consulting Physician (Dermatology)  Indicate any recent Medical Services you may have received from other than [facility] providers in the past year (date may be approximate).     Assessment:   This is a routine wellness examination for [place].  Hearing/Vision screen Vision Screening - Comments:: Regular eye exams, [facility]  Dietary issues and exercise activities discussed: Current Exercise Habits: Home exercise routine, Type of exercise: walking, Time (Minutes): 10, Frequency (Times/Week): 1, Weekly Exercise (Minutes/Week): 10   Goals Addressed             This Visit's Progress    Patient Stated       01/15/2023, wants to continue losing weight       Depression Screen    01/15/2023    8:45 AM 01/14/2022    9:52 AM 06/04/2020   10:36 AM 02/08/2019   10:53 AM 02/01/2018   10:35 AM 01/27/2017    8:10 AM 11/18/2015   11:34 AM  PHQ 2/9 Scores  PHQ - 2 Score 0 0 0 0 0 0 0  PHQ- 9 Score   0 0 0      Fall Risk    01/15/2023    8:45 AM 01/11/2023   12:23 PM 01/14/2022    9:50 AM 06/04/2020   10:35 AM 02/08/2019   10:53 AM  [place] in the past year? 0 0 0 0 0  Number falls in past yr: 0 0 0 0   Injury with Fall? 0 0 0 0   Risk for fall due to : Medication side effect  No Fall Risks Medication side effect   Follow up Falls prevention discussed;Education provided;Falls evaluation completed  Falls prevention discussed Falls evaluation completed;Falls prevention discussed     FALL RISK  PREVENTION PERTAINING TO THE HOME:  Any stairs in or around the home? Yes  If so, are there any without handrails? No  Home free of loose throw rugs in walkways, pet beds, electrical cords, etc? Yes  Adequate lighting in your home to reduce risk of falls? Yes   ASSISTIVE DEVICES UTILIZED TO PREVENT FALLS:  Life alert? No  Use of a cane, walker or w/c? No  Grab bars in the bathroom? Yes  Shower chair or bench in shower? No  Elevated toilet seat or a handicapped toilet? Yes   TIMED UP AND GO:  Was the test performed? No .      Cognitive Function:    06/04/2020   10:37 AM 02/08/2019   10:54 AM 02/01/2018   11:04 AM  MMSE - Mini Mental State Exam  Orientation to time 5 5 5  $ Orientation to Place 5 5 5  $ Registration 3 3 3  $ Attention/ Calculation 5 0 0  Recall 3 3 3  $ Language- name 2 objects  0 0  Language- repeat 1 1 1  $ Language- follow 3 step command  3 2  Language- follow 3 step command-comments   unable to follow 1 step of 3 step  command  Language- read & follow direction  0 0  Write a sentence  0 0  Copy design  0 0  Total score  20 19        01/15/2023    8:46 AM  6CIT Screen  What Year? 0 points  What month? 0 points  What time? 0 points  Count back from 20 0 points  Months in reverse 4 points  Repeat phrase 4 points  Total Score 8 points    Immunizations Immunization History  Administered Date(s) Administered   Fluad Quad(high Dose 65+) 09/14/2019, 11/27/2020, 09/02/2022   Influenza Split 10/04/2012   Influenza Whole 08/16/2008, 08/01/2014   Influenza, High Dose Seasonal PF 09/16/2017   Influenza,inj,Quad PF,6+ Mos 10/11/2013, 10/18/2015, 08/24/2016, 09/29/2018   PFIZER(Purple Top)SARS-COV-2 Vaccination 01/08/2020, 02/01/2020   Pneumococcal Conjugate-13 11/18/2015   Pneumococcal Polysaccharide-23 06/13/2012   Td 07/01/2015    TDAP status: Up to date  Flu Vaccine status: Up to date  Pneumococcal vaccine status: Up to date  Covid-19 vaccine  status: Completed vaccines  Qualifies for Shingles Vaccine? Yes   Zostavax completed No   Shingrix Completed?: No.    Education has been provided regarding the importance of this vaccine. Patient has been advised to call insurance company to determine out of pocket expense if they have not yet received this vaccine. Advised may also receive vaccine at local pharmacy or Health Dept. Verbalized acceptance and understanding.  Screening Tests Health Maintenance  Topic Date Due   Zoster Vaccines- Shingrix (1 of 2) Never done   FOOT EXAM  07/21/2014   COVID-19 Vaccine (3 - Pfizer risk series) 02/29/2020   Diabetic kidney evaluation - Urine ACR  06/05/2021   COLONOSCOPY (Pts 45-49yr Insurance coverage will need to be confirmed)  07/20/2022   HEMOGLOBIN A1C  08/15/2022   Medicare Annual Wellness (AWV)  01/14/2023   OPHTHALMOLOGY EXAM  05/20/2023   Diabetic kidney evaluation - eGFR measurement  08/18/2023   DTaP/Tdap/Td (2 - Tdap) 06/30/2025   Pneumonia Vaccine 77 Years old  Completed   INFLUENZA VACCINE  Completed   Hepatitis C Screening  Completed   HPV VACCINES  Aged Out    Health Maintenance  Health Maintenance Due  Topic Date Due   Zoster Vaccines- Shingrix (1 of 2) Never done   FOOT EXAM  07/21/2014   COVID-19 Vaccine (3 - Pfizer risk series) 02/29/2020   Diabetic kidney evaluation - Urine ACR  06/05/2021   COLONOSCOPY (Pts 45-445yrInsurance coverage will need to be confirmed)  07/20/2022   HEMOGLOBIN A1C  08/15/2022   Medicare Annual Wellness (AWV)  01/14/2023    Colorectal cancer screening: No longer required.   Lung Cancer Screening: (Low Dose CT Chest recommended if Age 76-80ears, 30 pack-year currently smoking OR have quit w/in 15years.) does not qualify.   Lung Cancer Screening Referral: no  Additional Screening:  Hepatitis C Screening: does qualify; Completed 01/27/2017  Vision Screening: Recommended annual ophthalmology exams for early detection of glaucoma  and other disorders of the eye. Is the patient up to date with their annual eye exam?  Yes  Who is the provider or what is the name of the office in which the patient attends annual eye exams? [facility] with [facility] provider, would they like to be referred to a provider to establish care? No .   Dental Screening: Recommended annual dental exams for proper oral hygiene  Community Resource Referral / Chronic Care Management: CRR  required this visit?  No   CCM required this visit?  No      Plan:     I have personally reviewed and noted the following in the patient's chart:   Medical and social history Use of alcohol, tobacco or illicit drugs  Current medications and supplements including opioid prescriptions. Patient is not currently taking opioid prescriptions. Functional ability and status Nutritional status Physical activity Advanced directives List of other physicians Hospitalizations, surgeries, and ER visits in previous 12 months Vitals Screenings to include cognitive, depression, and falls Referrals and appointments  In addition, I have reviewed and discussed with patient certain preventive protocols, quality metrics, and best practice recommendations. A written personalized care plan for preventive services as well as general preventive health recommendations were provided to patient.     Kellie Simmering, LPN   [id number]   Nurse Notes: none  Due to this being a virtual visit, the after visit summary with patients personalized plan was offered to patient via mail or my-chart. Patient would like to access on my-chart

## 2023-04-02 ENCOUNTER — Other Ambulatory Visit: Payer: Self-pay | Admitting: Family Medicine

## 2023-04-02 NOTE — Telephone Encounter (Signed)
Patient has been scheduled

## 2023-04-02 NOTE — Telephone Encounter (Signed)
Please call patient and schedule CPE and labs prior.Send back for refill after appointment is scheduled.

## 2023-04-13 ENCOUNTER — Other Ambulatory Visit (INDEPENDENT_AMBULATORY_CARE_PROVIDER_SITE_OTHER): Payer: PPO

## 2023-04-13 ENCOUNTER — Other Ambulatory Visit: Payer: Self-pay | Admitting: Family Medicine

## 2023-04-13 DIAGNOSIS — Z125 Encounter for screening for malignant neoplasm of prostate: Secondary | ICD-10-CM | POA: Diagnosis not present

## 2023-04-13 DIAGNOSIS — Z79899 Other long term (current) drug therapy: Secondary | ICD-10-CM

## 2023-04-13 DIAGNOSIS — E785 Hyperlipidemia, unspecified: Secondary | ICD-10-CM

## 2023-04-13 DIAGNOSIS — E1151 Type 2 diabetes mellitus with diabetic peripheral angiopathy without gangrene: Secondary | ICD-10-CM

## 2023-04-13 LAB — LIPID PANEL
Cholesterol: 109 mg/dL (ref 0–200)
HDL: 40 mg/dL
LDL Cholesterol: 56 mg/dL (ref 0–99)
NonHDL: 68.97
Total CHOL/HDL Ratio: 3
Triglycerides: 66 mg/dL (ref 0.0–149.0)
VLDL: 13.2 mg/dL (ref 0.0–40.0)

## 2023-04-13 LAB — CBC WITH DIFFERENTIAL/PLATELET
Basophils Absolute: 0.1 10*3/uL (ref 0.0–0.1)
Basophils Relative: 1 % (ref 0.0–3.0)
Eosinophils Absolute: 0.2 10*3/uL (ref 0.0–0.7)
Eosinophils Relative: 2.8 % (ref 0.0–5.0)
HCT: 43 % (ref 39.0–52.0)
Hemoglobin: 14.8 g/dL (ref 13.0–17.0)
Lymphocytes Relative: 23.2 % (ref 12.0–46.0)
Lymphs Abs: 1.3 10*3/uL (ref 0.7–4.0)
MCHC: 34.4 g/dL (ref 30.0–36.0)
MCV: 88.5 fl (ref 78.0–100.0)
Monocytes Absolute: 0.4 10*3/uL (ref 0.1–1.0)
Monocytes Relative: 6.6 % (ref 3.0–12.0)
Neutro Abs: 3.7 10*3/uL (ref 1.4–7.7)
Neutrophils Relative %: 66.4 % (ref 43.0–77.0)
Platelets: 170 10*3/uL (ref 150.0–400.0)
RBC: 4.86 Mil/uL (ref 4.22–5.81)
RDW: 13.7 % (ref 11.5–15.5)
WBC: 5.6 10*3/uL (ref 4.0–10.5)

## 2023-04-13 LAB — BASIC METABOLIC PANEL WITH GFR
BUN: 14 mg/dL (ref 6–23)
CO2: 25 meq/L (ref 19–32)
Calcium: 9.6 mg/dL (ref 8.4–10.5)
Chloride: 109 meq/L (ref 96–112)
Creatinine, Ser: 0.92 mg/dL (ref 0.40–1.50)
GFR: 81.16 mL/min
Glucose, Bld: 110 mg/dL — ABNORMAL HIGH (ref 70–99)
Potassium: 4.9 meq/L (ref 3.5–5.1)
Sodium: 142 meq/L (ref 135–145)

## 2023-04-13 LAB — HEPATIC FUNCTION PANEL
ALT: 19 U/L (ref 0–53)
AST: 17 U/L (ref 0–37)
Albumin: 4.7 g/dL (ref 3.5–5.2)
Alkaline Phosphatase: 77 U/L (ref 39–117)
Bilirubin, Direct: 0.3 mg/dL (ref 0.0–0.3)
Total Bilirubin: 1.2 mg/dL (ref 0.2–1.2)
Total Protein: 6.8 g/dL (ref 6.0–8.3)

## 2023-04-13 LAB — HEMOGLOBIN A1C: Hgb A1c MFr Bld: 6 % (ref 4.6–6.5)

## 2023-04-13 LAB — PSA, MEDICARE: PSA: 2.18 ng/mL (ref 0.10–4.00)

## 2023-04-17 ENCOUNTER — Other Ambulatory Visit: Payer: Self-pay | Admitting: Family Medicine

## 2023-04-18 NOTE — Progress Notes (Signed)
Colton Manas T. [name], MD, CAQ Sports Medicine [facility] at [facility] [address] [place], [address]  Phone: [phone]  FAX: [phone]  [name] - 77 y.o. male  MRN [id number]  Date of Birth: 1946-09-03  Date: 04/19/2023  PCP: Hannah Beat, MD  Referral: Hannah Beat, MD  No chief complaint on file.  Patient Care Team: Hannah Beat, MD as PCP - General (Family Medicine) Bufford Buttner, MD as Consulting Physician (Dermatology) Subjective:   [name] is a 77 y.o. pleasant patient who presents with the following:  Preventative Health Maintenance Visit:  Health Maintenance Summary Reviewed and updated, unless pt declines services.  Tobacco History Reviewed. Alcohol: No concerns, no excessive use Exercise Habits: Some activity, rec at least 30 mins 5 times a week STD concerns: no risk or activity to increase risk Drug Use: None  Shingrix Foot exam Covid   Health Maintenance  Topic Date Due   Zoster Vaccines- Shingrix (1 of 2) Never done   FOOT EXAM  07/21/2014   COVID-19 Vaccine (3 - Pfizer risk series) 02/29/2020   Diabetic kidney evaluation - Urine ACR  06/05/2021   COLONOSCOPY (Pts 45-85yrs Insurance coverage will need to be confirmed)  07/20/2022   OPHTHALMOLOGY EXAM  05/20/2023   INFLUENZA VACCINE  07/01/2023   HEMOGLOBIN A1C  10/14/2023   Medicare Annual Wellness (AWV)  01/16/2024   Diabetic kidney evaluation - eGFR measurement  04/12/2024   DTaP/Tdap/Td (2 - Tdap) 06/30/2025   Pneumonia Vaccine 41+ Years old  Completed   Hepatitis C Screening  Completed   HPV VACCINES  Aged Out   Immunization History  Administered Date(s) Administered   Fluad Quad(high Dose 65+) 09/14/2019, 11/27/2020, 09/02/2022   Influenza Split 10/04/2012   Influenza Whole 08/16/2008, 08/01/2014   Influenza, High Dose Seasonal PF 09/16/2017   Influenza,inj,Quad PF,6+ Mos 10/11/2013, 10/18/2015, 08/24/2016, 09/29/2018    PFIZER(Purple Top)SARS-COV-2 Vaccination 01/08/2020, 02/01/2020   Pneumococcal Conjugate-13 11/18/2015   Pneumococcal Polysaccharide-23 06/13/2012   Td 07/01/2015   Patient Active Problem List   Diagnosis Date Noted   Chronic diastolic CHF (congestive heart failure) (HCC) 02/22/2021    Priority: High   Coronary artery disease of native artery of native heart with stable angina pectoris (HCC) 08/18/2017    Priority: High   Centrilobular emphysema (HCC) 09/12/2014    Priority: High   History of MI (myocardial infarction)     Priority: High   DM (diabetes mellitus) type II, controlled, with peripheral vascular disorder (HCC) 02/13/2010    Priority: High   Dementia 08/17/2022    Priority: Medium    OSA (obstructive sleep apnea) 09/15/2017    Priority: Medium    Hyperlipidemia LDL goal <70 08/16/2008    Priority: Medium    Essential hypertension 08/16/2008    Priority: Medium    Esophageal dysphagia 11/26/2022   Aortic transection    Peripheral vascular disease (HCC)    Duodenal ulcer with hemorrhage 04/30/2011   Major depressive disorder, recurrent, in remission (HCC) 08/16/2008    Past Medical History:  Diagnosis Date   Aortic transection 2005   Traumatic after a fall from a second floor. s/p repair at [facility]   Basal cell carcinoma    CAD (coronary artery disease) 1992   a. 1992 Acute anterior MI, thrombolytic therapy-->cath reportedly w/o significant CAD; b. 08/2016 MV: EF 57%, hypertensive respons, no ischemia/infarct; c. 04/2017 CTA chest w/ coronary Ca2+.   Depression    Diastolic dysfunction  a. 04/2017 Echo: EF 60-65%, Gr1 DD, nl RV fxn.   Erectile dysfunction    Fall 2005   fell off house: torn aorta, clavicle fracture, rib fracture, vertebral fractures, lung contusion, coma x 2 weeks   Fracture of femoral neck, left (HCC) 08/09/2014   ORIF, Dr. Ernest Pine   Fracture of radial neck, left, closed 08/09/2014   Gallstone pancreatitis    Gastric ulcer    History of ATN  08/09/2014   [facility], 2 days of dialysis (ARF)   Hyperlipidemia    Statin with joint pain    Hypertension    Obesity    Peripheral vascular disease (HCC)    Atherosclerotic:R renal artery stenosis   Type II diabetes mellitus (HCC)    Upper GI bleed    a. 04/2017 admit w/ melena and HGB down to 7 req prbc's; b. 04/2017 EGD: small HH, non-bleeding gastric & duod ulcers, non-bleeding erosive gastropathy-->PPI Rx.    Past Surgical History:  Procedure Laterality Date   CARDIAC CATHETERIZATION  08/1991   50 % mid-Lad stenosis with clot, 25-505 second marginal   CARDIOVASCULAR SURGERY     with ruptured Aorta, Dr. Meyer Russel, [facility]    CHOLECYSTECTOMY     ESOPHAGOGASTRODUODENOSCOPY (EGD) WITH PROPOFOL N/A 05/03/2017   Procedure: ESOPHAGOGASTRODUODENOSCOPY (EGD) WITH PROPOFOL;  Surgeon: Midge Minium, MD;  Location: [facility] ENDOSCOPY;  Service: Endoscopy;  Laterality: N/A;   ESOPHAGOGASTRODUODENOSCOPY (EGD) WITH PROPOFOL N/A 11/26/2022   Procedure: ESOPHAGOGASTRODUODENOSCOPY (EGD) WITH PROPOFOL;  Surgeon: Midge Minium, MD;  Location: [facility] ENDOSCOPY;  Service: Endoscopy;  Laterality: N/A;   KNEE SURGERY     Right    ORIF HIP FRACTURE Left 08/12/2014   Dr. Ernest Pine   THORACOTOMY     thoracic aorta repair    TRACHEOSTOMY     s/p reversal   VIDEO BRONCHOSCOPY Bilateral 05/27/2017   Procedure: VIDEO BRONCHOSCOPY WITHOUT FLUORO;  Surgeon: Lupita Leash, MD;  Location: [facility] ENDOSCOPY;  Service: Cardiopulmonary;  Laterality: Bilateral;    Family History  Problem Relation Age of Onset   Dementia Mother 73   Heart attack Father 23   Diabetes Brother 70   Heart attack Brother    Diabetes Brother    Colon cancer Neg Hx    Stomach cancer Neg Hx    Esophageal cancer Neg Hx    Rectal cancer Neg Hx     Social History   Social History Narrative   No regular exercise     Past Medical History, Surgical History, Social History, Family History, Problem List, Medications, and Allergies have been reviewed  and updated if relevant.  Review of Systems: Pertinent positives are listed above.  Otherwise, a full 14 point review of systems has been done in full and it is negative except where it is noted positive.  Objective:   There were no vitals taken for this visit. Ideal Body Weight:    Ideal Body Weight:   No results found.    01/15/2023    8:45 AM 01/14/2022    9:52 AM 06/04/2020   10:36 AM 02/08/2019   10:53 AM 02/01/2018   10:35 AM  Depression screen PHQ 2/9  Decreased Interest 0 0 0 0 0  Down, Depressed, Hopeless 0 0 0 0 0  PHQ - 2 Score 0 0 0 0 0  Altered sleeping   0 0 0  Tired, decreased energy   0 0 0  Change in appetite   0 0 0  Feeling bad or failure about yourself  0 0 0  Trouble concentrating   0 0 0  Moving slowly or fidgety/restless   0 0 0  Suicidal thoughts   0 0 0  PHQ-9 Score   0 0 0  Difficult doing work/chores   Not difficult at all Not difficult at all Not difficult at all     GEN: well developed, well nourished, no acute distress Eyes: conjunctiva and lids normal, PERRLA, EOMI ENT: TM clear, nares clear, oral exam WNL Neck: supple, no lymphadenopathy, no thyromegaly, no JVD Pulm: clear to auscultation and percussion, respiratory effort normal CV: regular rate and rhythm, S1-S2, no murmur, rub or gallop, no bruits, peripheral pulses normal and symmetric, no cyanosis, clubbing, edema or varicosities GI: soft, non-tender; no hepatosplenomegaly, masses; active bowel sounds all quadrants GU: deferred Lymph: no cervical, axillary or inguinal adenopathy MSK: gait normal, muscle tone and strength WNL, no joint swelling, effusions, discoloration, crepitus  SKIN: clear, good turgor, color WNL, no rashes, lesions, or ulcerations Neuro: normal mental status, normal strength, sensation, and motion Psych: alert; oriented to person, place and time, normally interactive and not anxious or depressed in appearance.  All labs reviewed with patient. Results for orders  placed or performed in visit on 04/13/23  PSA, Medicare  Result Value Ref Range   PSA 2.18 0.10 - 4.00 ng/ml  Lipid panel  Result Value Ref Range   Cholesterol 109 0 - 200 mg/dL   Triglycerides 81.1 0.0 - 149.0 mg/dL   HDL 91.47 >82.95 mg/dL   VLDL 62.1 0.0 - 30.8 mg/dL   LDL Cholesterol 56 0 - 99 mg/dL   Total CHOL/HDL Ratio 3    NonHDL 68.97   Hemoglobin A1c  Result Value Ref Range   Hgb A1c MFr Bld 6.0 4.6 - 6.5 %  Hepatic function panel  Result Value Ref Range   Total Bilirubin 1.2 0.2 - 1.2 mg/dL   Bilirubin, Direct 0.3 0.0 - 0.3 mg/dL   Alkaline Phosphatase 77 39 - 117 U/L   AST 17 0 - 37 U/L   ALT 19 0 - 53 U/L   Total Protein 6.8 6.0 - 8.3 g/dL   Albumin 4.7 3.5 - 5.2 g/dL  CBC with Differential/Platelet  Result Value Ref Range   WBC 5.6 4.0 - 10.5 K/uL   RBC 4.86 4.22 - 5.81 Mil/uL   Hemoglobin 14.8 13.0 - 17.0 g/dL   HCT 65.7 84.6 - 96.2 %   MCV 88.5 78.0 - 100.0 fl   MCHC 34.4 30.0 - 36.0 g/dL   RDW 95.2 84.1 - 32.4 %   Platelets 170.0 150.0 - 400.0 K/uL   Neutrophils Relative % 66.4 43.0 - 77.0 %   Lymphocytes Relative 23.2 12.0 - 46.0 %   Monocytes Relative 6.6 3.0 - 12.0 %   Eosinophils Relative 2.8 0.0 - 5.0 %   Basophils Relative 1.0 0.0 - 3.0 %   Neutro Abs 3.7 1.4 - 7.7 K/uL   Lymphs Abs 1.3 0.7 - 4.0 K/uL   Monocytes Absolute 0.4 0.1 - 1.0 K/uL   Eosinophils Absolute 0.2 0.0 - 0.7 K/uL   Basophils Absolute 0.1 0.0 - 0.1 K/uL  Basic metabolic panel  Result Value Ref Range   Sodium 142 135 - 145 mEq/L   Potassium 4.9 3.5 - 5.1 mEq/L   Chloride 109 96 - 112 mEq/L   CO2 25 19 - 32 mEq/L   Glucose, Bld 110 (H) 70 - 99 mg/dL   BUN 14 6 - 23 mg/dL  Creatinine, Ser 0.92 0.40 - 1.50 mg/dL   GFR 81.19 >14.78 mL/min   Calcium 9.6 8.4 - 10.5 mg/dL    Assessment and Plan:     ICD-10-CM   1. Healthcare maintenance  Z00.00       Health Maintenance Exam: The patient's preventative maintenance and recommended screening tests for an annual wellness  exam were reviewed in full today. Brought up to date unless services declined.  Counselled on the importance of diet, exercise, and its role in overall health and mortality. The patient's FH and SH was reviewed, including their home life, tobacco status, and drug and alcohol status.  Follow-up in 1 year for physical exam or additional follow-up below.  Disposition: No follow-ups on file.  No orders of the defined types were placed in this encounter.  Medications Discontinued During This Encounter  Medication Reason   doxycycline (VIBRA-TABS) 100 MG tablet    No orders of the defined types were placed in this encounter.   Signed,  Elpidio Galea. Greenly Rarick, MD   Allergies as of 04/19/2023       Reactions   Contrast Media [iodinated Contrast Media] Hives   Penicillins Swelling, Other (See Comments)   08/05/22 Per MD: pt had severe rash and facial swelling with PCN   Xarelto [rivaroxaban] Other (See Comments)   GI Bleed   Clindamycin/lincomycin Rash   Dye Fdc Red [red Dye] Rash   Lovenox [enoxaparin Sodium] Rash        Medication List        Accurate as of Apr 18, 2023 12:23 PM. If you have any questions, ask your nurse or doctor.          STOP taking these medications    doxycycline 100 MG tablet Commonly known as: VIBRA-TABS       TAKE these medications    albuterol 108 (90 Base) MCG/ACT inhaler Commonly known as: VENTOLIN HFA Inhale 2 puffs into the lungs every 6 (six) hours as needed for wheezing or shortness of breath.   amLODipine 10 MG tablet Commonly known as: NORVASC TAKE 1 TABLET BY MOUTH DAILY   aspirin 81 MG chewable tablet Chew 81 mg by mouth daily.   atorvastatin 40 MG tablet Commonly known as: LIPITOR TAKE ONE TABLET BY MOUTH EVERY DAY   citalopram 10 MG tablet Commonly known as: CELEXA TAKE ONE TABLET BY MOUTH EVERY DAY   dextromethorphan-guaiFENesin 30-600 MG 12hr tablet Commonly known as: MUCINEX DM Take 1 tablet by mouth 2 (two)  times daily as needed for cough.   donepezil 5 MG tablet Commonly known as: ARICEPT TAKE 1 TABLET BY MOUTH AT BEDTIME   dorzolamide-timolol 2-0.5 % ophthalmic solution Commonly known as: COSOPT 1 drop 2 (two) times daily.   latanoprost 0.005 % ophthalmic solution Commonly known as: XALATAN Place 1 drop into the left eye at bedtime.   metFORMIN 500 MG 24 hr tablet Commonly known as: GLUCOPHAGE-XR TAKE 3 TABLETS BY MOUTH DAILY   nitroGLYCERIN 0.4 MG SL tablet Commonly known as: NITROSTAT Place 1 tablet (0.4 mg total) under the tongue every 5 (five) minutes as needed for chest pain.   Trelegy Ellipta 100-62.5-25 MCG/ACT Aepb Generic drug: Fluticasone-Umeclidin-Vilant INHALE 1 PUFF INTO THE LUNGS DAILY AS DIRECTED

## 2023-04-19 ENCOUNTER — Ambulatory Visit (INDEPENDENT_AMBULATORY_CARE_PROVIDER_SITE_OTHER): Payer: PPO | Admitting: Family Medicine

## 2023-04-19 ENCOUNTER — Encounter: Payer: Self-pay | Admitting: Family Medicine

## 2023-04-19 VITALS — BP 130/60 | HR 66 | Temp 97.7°F | Ht 67.75 in | Wt 196.0 lb

## 2023-04-19 DIAGNOSIS — Z1211 Encounter for screening for malignant neoplasm of colon: Secondary | ICD-10-CM

## 2023-04-19 DIAGNOSIS — Z0000 Encounter for general adult medical examination without abnormal findings: Secondary | ICD-10-CM

## 2023-04-19 DIAGNOSIS — E1151 Type 2 diabetes mellitus with diabetic peripheral angiopathy without gangrene: Secondary | ICD-10-CM | POA: Diagnosis not present

## 2023-04-19 DIAGNOSIS — Z7984 Long term (current) use of oral hypoglycemic drugs: Secondary | ICD-10-CM

## 2023-04-19 LAB — MICROALBUMIN / CREATININE URINE RATIO
Creatinine,U: 130.1 mg/dL
Microalb Creat Ratio: 14.5 mg/g (ref 0.0–30.0)
Microalb, Ur: 18.9 mg/dL — ABNORMAL HIGH (ref 0.0–1.9)

## 2023-04-19 NOTE — Patient Instructions (Signed)
For colon cancer screening, you will receive a kit in the mail for Cologuard, which screens the stool for DNA markers for colon cancer.   Consider SHINGRIX / shingles vaccine  Consider Covid booster shot   Consider RSV vaccine

## 2023-05-17 ENCOUNTER — Other Ambulatory Visit: Payer: Self-pay | Admitting: Family Medicine

## 2023-06-23 ENCOUNTER — Other Ambulatory Visit: Payer: Self-pay | Admitting: Family Medicine

## 2023-06-23 DIAGNOSIS — F0280 Dementia in other diseases classified elsewhere without behavioral disturbance: Secondary | ICD-10-CM

## 2023-07-02 ENCOUNTER — Other Ambulatory Visit: Payer: Self-pay | Admitting: Family Medicine

## 2023-08-30 DIAGNOSIS — H401123 Primary open-angle glaucoma, left eye, severe stage: Secondary | ICD-10-CM | POA: Diagnosis not present

## 2023-08-30 DIAGNOSIS — H401112 Primary open-angle glaucoma, right eye, moderate stage: Secondary | ICD-10-CM | POA: Diagnosis not present

## 2023-08-30 LAB — HM DIABETES EYE EXAM

## 2023-09-22 ENCOUNTER — Encounter: Payer: Self-pay | Admitting: Ophthalmology

## 2023-09-22 DIAGNOSIS — H2511 Age-related nuclear cataract, right eye: Secondary | ICD-10-CM | POA: Diagnosis not present

## 2023-09-27 NOTE — Discharge Instructions (Signed)

## 2023-09-29 ENCOUNTER — Ambulatory Visit: Payer: PPO | Admitting: Anesthesiology

## 2023-09-29 ENCOUNTER — Encounter
Admission: RE | Disposition: A | Discharge status: Home / Self Care | Payer: Self-pay | Source: Home / Self Care | Attending: Ophthalmology

## 2023-09-29 ENCOUNTER — Other Ambulatory Visit: Payer: Self-pay

## 2023-09-29 ENCOUNTER — Encounter: Payer: Self-pay | Admitting: Ophthalmology

## 2023-09-29 ENCOUNTER — Ambulatory Visit
Admission: RE | Admit: 2023-09-29 | Discharge: 2023-09-29 | Disposition: A | Discharge status: Home / Self Care | Payer: PPO | Attending: Ophthalmology | Admitting: Ophthalmology

## 2023-09-29 DIAGNOSIS — Z7984 Long term (current) use of oral hypoglycemic drugs: Secondary | ICD-10-CM | POA: Insufficient documentation

## 2023-09-29 DIAGNOSIS — I110 Hypertensive heart disease with heart failure: Secondary | ICD-10-CM | POA: Diagnosis not present

## 2023-09-29 DIAGNOSIS — H401123 Primary open-angle glaucoma, left eye, severe stage: Secondary | ICD-10-CM | POA: Insufficient documentation

## 2023-09-29 DIAGNOSIS — H269 Unspecified cataract: Secondary | ICD-10-CM | POA: Diagnosis not present

## 2023-09-29 DIAGNOSIS — H2512 Age-related nuclear cataract, left eye: Secondary | ICD-10-CM | POA: Insufficient documentation

## 2023-09-29 DIAGNOSIS — E1136 Type 2 diabetes mellitus with diabetic cataract: Secondary | ICD-10-CM | POA: Diagnosis present

## 2023-09-29 DIAGNOSIS — I5032 Chronic diastolic (congestive) heart failure: Secondary | ICD-10-CM | POA: Diagnosis not present

## 2023-09-29 DIAGNOSIS — E785 Hyperlipidemia, unspecified: Secondary | ICD-10-CM | POA: Insufficient documentation

## 2023-09-29 DIAGNOSIS — Z8249 Family history of ischemic heart disease and other diseases of the circulatory system: Secondary | ICD-10-CM | POA: Diagnosis not present

## 2023-09-29 DIAGNOSIS — Z87891 Personal history of nicotine dependence: Secondary | ICD-10-CM | POA: Diagnosis not present

## 2023-09-29 DIAGNOSIS — I509 Heart failure, unspecified: Secondary | ICD-10-CM | POA: Diagnosis not present

## 2023-09-29 DIAGNOSIS — Z833 Family history of diabetes mellitus: Secondary | ICD-10-CM | POA: Diagnosis not present

## 2023-09-29 DIAGNOSIS — Z85828 Personal history of other malignant neoplasm of skin: Secondary | ICD-10-CM | POA: Diagnosis not present

## 2023-09-29 DIAGNOSIS — I2510 Atherosclerotic heart disease of native coronary artery without angina pectoris: Secondary | ICD-10-CM | POA: Insufficient documentation

## 2023-09-29 DIAGNOSIS — I25119 Atherosclerotic heart disease of native coronary artery with unspecified angina pectoris: Secondary | ICD-10-CM | POA: Diagnosis not present

## 2023-09-29 DIAGNOSIS — J449 Chronic obstructive pulmonary disease, unspecified: Secondary | ICD-10-CM | POA: Insufficient documentation

## 2023-09-29 HISTORY — DX: Other ill-defined heart diseases: I51.89

## 2023-09-29 HISTORY — DX: Chronic obstructive pulmonary disease, unspecified: J44.9

## 2023-09-29 HISTORY — PX: CATARACT EXTRACTION W/PHACO: SHX586

## 2023-09-29 HISTORY — DX: Presence of dental prosthetic device (complete) (partial): Z97.2

## 2023-09-29 LAB — GLUCOSE, CAPILLARY: Glucose-Capillary: 119 mg/dL — ABNORMAL HIGH (ref 70–99)

## 2023-09-29 SURGERY — CATARACT EXTRACTION PHACO AND INTRAOCULAR LENS PLACEMENT (IOC)
Anesthesia: Monitor Anesthesia Care | Laterality: Left

## 2023-09-29 MED ORDER — MIDAZOLAM HCL 2 MG/2ML IJ SOLN
INTRAMUSCULAR | Status: DC | PRN
  Administered 2023-09-29: 1 mg via INTRAVENOUS

## 2023-09-29 MED ORDER — SIGHTPATH DOSE#1 NA HYALUR & NA CHOND-NA HYALUR IO KIT
PACK | INTRAOCULAR | Status: DC | PRN
  Administered 2023-09-29: 1 via OPHTHALMIC

## 2023-09-29 MED ORDER — MIDAZOLAM HCL 2 MG/2ML IJ SOLN
INTRAMUSCULAR | Status: AC
  Filled 2023-09-29: qty 2

## 2023-09-29 MED ORDER — FENTANYL CITRATE (PF) 100 MCG/2ML IJ SOLN
INTRAMUSCULAR | Status: DC | PRN
  Administered 2023-09-29 (×2): 12.5 ug via INTRAVENOUS

## 2023-09-29 MED ORDER — FENTANYL CITRATE (PF) 100 MCG/2ML IJ SOLN
INTRAMUSCULAR | Status: AC
  Filled 2023-09-29: qty 2

## 2023-09-29 MED ORDER — BRIMONIDINE TARTRATE-TIMOLOL 0.2-0.5 % OP SOLN: OPHTHALMIC | Status: DC | PRN

## 2023-09-29 MED ORDER — TETRACAINE HCL 0.5 % OP SOLN
OPHTHALMIC | Status: AC
  Filled 2023-09-29: qty 4

## 2023-09-29 MED ORDER — SIGHTPATH DOSE#1 BSS IO SOLN
INTRAOCULAR | Status: DC | PRN
  Administered 2023-09-29: 84 mL via OPHTHALMIC

## 2023-09-29 MED ORDER — MOXIFLOXACIN HCL 0.5 % OP SOLN
OPHTHALMIC | Status: DC | PRN
  Administered 2023-09-29: .2 mL via OPHTHALMIC

## 2023-09-29 MED ORDER — SODIUM CHLORIDE 0.9% FLUSH
INTRAVENOUS | Status: DC | PRN
  Administered 2023-09-29: 10 mL via INTRAVENOUS

## 2023-09-29 MED ORDER — ARMC OPHTHALMIC DILATING DROPS
1.0000 | OPHTHALMIC | Status: DC | PRN
  Administered 2023-09-29 (×3): 1 via OPHTHALMIC

## 2023-09-29 MED ORDER — SIGHTPATH DOSE#1 NA CHONDROIT SULF-NA HYALURON 20-15 MG/0.5ML IO SOSY
INTRAOCULAR | Status: DC | PRN
  Administered 2023-09-29: .5 mL via INTRAOCULAR

## 2023-09-29 MED ORDER — ARMC OPHTHALMIC DILATING DROPS
OPHTHALMIC | Status: AC
  Filled 2023-09-29: qty 0.5

## 2023-09-29 MED ORDER — TETRACAINE HCL 0.5 % OP SOLN
1.0000 [drp] | OPHTHALMIC | Status: DC | PRN
  Administered 2023-09-29 (×3): 1 [drp] via OPHTHALMIC

## 2023-09-29 MED ORDER — SIGHTPATH DOSE#1 BSS IO SOLN
INTRAOCULAR | Status: DC | PRN
  Administered 2023-09-29: 2 mL

## 2023-09-29 SURGICAL SUPPLY — 11 items
BLADE DUAL KAHOOK SINGLE USE (BLADE) ×1
CATARACT SUITE SIGHTPATH (MISCELLANEOUS) ×1
CLIP IPRISM (KITS) ×1
GLOVE SRG 8 PF TXTR STRL LF DI (GLOVE) ×1
GLOVE SURG ENC TEXT LTX SZ7.5 (GLOVE) ×1
GLOVE SURG UNDER POLY LF SZ8 (GLOVE) ×1
LENS IOL TECNIS EYHANCE 16.5 (Intraocular Lens) ×1 IMPLANT
NDL FILTER BLUNT 18X1 1/2 (NEEDLE) ×1 IMPLANT
NEEDLE FILTER BLUNT 18X1 1/2 (NEEDLE) ×1
RING MALYGIN 7.0 (MISCELLANEOUS) ×1
SYR 3ML LL SCALE MARK (SYRINGE) ×1

## 2023-09-29 NOTE — Anesthesia Preprocedure Evaluation (Addendum)
Anesthesia Evaluation  Patient identified by MRN, date of birth, ID band Patient awake    Reviewed: Allergy & Precautions, H&P , NPO status , Patient's Chart, lab work & pertinent test results  Airway Mallampati: III       Dental  (+) Edentulous Upper, Edentulous Lower   Pulmonary sleep apnea , COPD, former smoker          Cardiovascular hypertension, + angina  + CAD, + Peripheral Vascular Disease and +CHF    2005: Aortic transection     Comment:  Traumatic after a fall from a second floor. s/p repair               at [facility] No date: Basal cell carcinoma 1992: CAD (coronary artery disease)     Comment:  a. 1992 Acute anterior MI, thrombolytic therapy-->cath               reportedly w/o significant CAD; b. 08/2016 MV: EF 57%,               hypertensive respons, no ischemia/infarct; c. 04/2017 CTA               chest w/ coronary Ca2+.    04/2017 Echo: EF 60-65%, Gr1 DD, nl RV fxn.  2005: Fall     Comment:  fell off house: torn aorta, clavicle fracture, rib               fracture, vertebral fractures, lung contusion, coma x 2               weeks 08/09/2014: Fracture of femoral neck, left (HCC)     Comment:  ORIF, Dr. Ernest Pine 08/09/2014: Fracture of radial neck, left, closed 08/09/2014: History of ATN     Comment:  [facility], 2 days of dialysis (ARF) No date: Hyperlipidemia     Comment:  Statin with joint pain  No date: Hypertension No date: Obesity No date: Peripheral vascular disease (HCC)     Comment:  Atherosclerotic:R renal artery stenosis      Comment:  a. 04/2017 admit w/ melena and HGB down to 7 req prbc's;               b. 04/2017 EGD: small HH, non-bleeding gastric & duod               ulcers, non-bleeding erosive gastropathy-->PPI Rx.   Past Surgical History: 08/1991: CARDIAC CATHETERIZATION     Comment:  50 % mid-Lad stenosis with clot, 25-505 second marginal No date: CARDIOVASCULAR SURGERY     Comment:  with ruptured  Aorta, Dr. Meyer Russel, [facility]     Neuro/Psych  PSYCHIATRIC DISORDERS  Depression   Dementia negative neurological ROS  negative psych ROS   GI/Hepatic Neg liver ROS, PUD,,,Hx GI bleed Hx gallstone pancreatitis Hx gastric ulcer   Endo/Other  diabetes    Renal/GU negative Renal ROS09-19-2015 ATN, on dialysis x 2 days in ICU  negative genitourinary   Musculoskeletal negative musculoskeletal ROS (+)    Abdominal   Peds negative pediatric ROS (+)  Hematology negative hematology ROS (+)   Anesthesia Other Findings Depression  Hyperlipidemia Hypertension  Peripheral vascular disease (HCC) Fall  Erectile dysfunction Aortic transection  Gallstone pancreatitis Basal cell carcinoma  Fracture of femoral neck, left (HCC) Fracture of radial neck, left, closed History of ATN Gastric ulcer  CAD (coronary artery disease) Diastolic dysfunction  Type II diabetes mellitus (HCC) Obesity  Upper GI bleed Wears dentures  COPD (chronic obstructive pulmonary disease) (  HCC)    Reproductive/Obstetrics negative OB ROS                             Anesthesia Physical Anesthesia Plan  ASA: 4  Anesthesia Plan: MAC   Post-op Pain Management:    Induction: Intravenous  PONV Risk Score and Plan:   Airway Management Planned: Natural Airway and Nasal Cannula  Additional Equipment:   Intra-op Plan:   Post-operative Plan:   Informed Consent: I have reviewed the patients History and Physical, chart, labs and discussed the procedure including the risks, benefits and alternatives for the proposed anesthesia with the patient or authorized representative who has indicated his/her understanding and acceptance.     Dental Advisory Given  Plan Discussed with: Anesthesiologist, CRNA and Surgeon  Anesthesia Plan Comments: (Patient consented for risks of anesthesia including but not limited to:  - adverse reactions to medications - damage to eyes, teeth, lips or  other oral mucosa - nerve damage due to positioning  - sore throat or hoarseness - Damage to heart, brain, nerves, lungs, other parts of body or loss of life  Patient voiced understanding and assent.)       Anesthesia Quick Evaluation

## 2023-09-29 NOTE — Transfer of Care (Signed)
Immediate Anesthesia Transfer of Care Note  Patient: [name]  Procedure(s) Performed: CATARACT EXTRACTION PHACO AND INTRAOCULAR LENS PLACEMENT (IOC) LEFT DIABETIC KAHOOK DUAL BLADE GONIOTOMY 13.75 01:00.5 (Left)  Patient Location: PACU  Anesthesia Type:MAC  Level of Consciousness: awake and alert   Airway & Oxygen Therapy: Patient Spontanous Breathing  Post-op Assessment: Report given to RN and Post -op Vital signs reviewed and stable  Post vital signs: Reviewed and stable  Last Vitals: Normal temp  No pain per pt  Vitals Value Taken Time  BP 121/66 09/29/23 1058  Temp    Pulse 57 09/29/23 1059  Resp 12 09/29/23 1059  SpO2 92 % 09/29/23 1059  Vitals shown include unfiled device data.  Last Pain:  Vitals:   09/29/23 0917  TempSrc: Temporal  PainSc: 0-No pain         Complications: No notable events documented.

## 2023-09-29 NOTE — Anesthesia Postprocedure Evaluation (Signed)
Anesthesia Post Note  Patient: [name]  Procedure(s) Performed: CATARACT EXTRACTION PHACO AND INTRAOCULAR LENS PLACEMENT (IOC) LEFT DIABETIC [place] DUAL BLADE GONIOTOMY 13.75 01:00.5 (Left)  Patient location during evaluation: PACU Anesthesia Type: MAC Level of consciousness: awake and alert Pain management: pain level controlled Vital Signs Assessment: post-procedure vital signs reviewed and stable Respiratory status: spontaneous breathing, nonlabored ventilation, respiratory function stable and patient connected to nasal cannula oxygen Cardiovascular status: stable and blood pressure returned to baseline Postop Assessment: no apparent nausea or vomiting Anesthetic complications: no   No notable events documented.   Last Vitals:  Vitals:   09/29/23 1100 09/29/23 1103  BP: 121/66 118/64  Pulse: (!) 57 (!) 57  Resp: 12 12  Temp:  (!) 36.2 C  SpO2: 92% 92%    Last Pain:  Vitals:   09/29/23 1103  TempSrc:   PainSc: 0-No pain                 Orma Cheetham C Ramil Edgington

## 2023-09-29 NOTE — Op Note (Signed)
PREOPERATIVE DIAGNOSIS:  Nuclear sclerotic cataract left eye. 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 Item Serial No. Manufacturer Lot No. LRB No. Used Action  LENS IOL TECNIS EYHANCE 16.5 - [id number] Intraocular Lens LENS IOL TECNIS EYHANCE 16.5 [id number] [employer]  Left 1 Implanted    SURGEON:  Deirdre Evener, MD   ANESTHESIA:  Topical with tetracaine drops augmented with 1% preservative-free intracameral lidocaine.    COMPLICATIONS:  None.   DESCRIPTION OF PROCEDURE:  The patient was identified in the holding room and transported to the operating room and placed in the supine position under the operating microscope.  The left eye was identified as the operative eye and it was prepped and draped in the usual sterile ophthalmic fashion.   A 1 millimeter clear-corneal paracentesis was made at the 5:30 position.  0.5 ml of preservative-free 1% lidocaine was injected into the anterior chamber.  The anterior chamber was filled with Viscoat viscoelastic.  A 2.4 millimeter keratome was used to make a near-clear corneal incision at the 2:30 position. The microscope was adjusted and a gonioprism was used to visulaize the trabecular meshwork.  The [facility] Dual Blade was advanced across the anterior chamber under viscoelastic.  The blade was used to mark the trabecular meshwork at the 7:30 position.  The blade was placed two clock hours clockwise into the meshwork.  Proper postioning  was confirmed.  The blade ws passed counterclockwise through the meshwork to excise approximately two to three clock-hours of trabecular meshwork. A Malyugin ring was placed to enlarge the pupil to 7mm.   A curvilinear capsulorrhexis was made with a cystotome and capsulorrhexis forceps.  Balanced salt solution was used to hydrodissect and hydrodelineate the nucleus.   Phacoemulsification was then used in stop and chop fashion to remove the lens nucleus and epinucleus.  The remaining cortex was then removed using the irrigation and aspiration handpiece. Provisc was then placed into the capsular bag to distend it for lens placement.  A lens was then injected into the capsular bag. The Malyugin w=ring was removed. The remaining viscoelastic was aspirated.   Wounds were hydrated with balanced salt solution.  The anterior chamber was inflated to a physiologic pressure with balanced salt solution.  No wound leaks were noted. Vigamox 0.2 ml of a 1mg  per ml solution was injected into the anterior chamber for a dose of 0.2 mg of intracameral antibiotic at the completion of the case.  The patient was taken to the recovery room in stable condition without complications of anesthesia or surgery.

## 2023-09-29 NOTE — H&P (Signed)
[facility]   Primary Care Physician:  Hannah Beat, MD Ophthalmologist: Dr. Lockie Mola  Pre-Procedure History & Physical: HPI:  [name] is a 77 y.o. male here for ophthalmic surgery.   Past Medical History:  Diagnosis Date   Aortic transection 2005   Traumatic after a fall from a second floor. s/p repair at [facility]   Basal cell carcinoma    CAD (coronary artery disease) 1992   a. 1992 Acute anterior MI, thrombolytic therapy-->cath reportedly w/o significant CAD; b. 08/2016 MV: EF 57%, hypertensive respons, no ischemia/infarct; c. 04/2017 CTA chest w/ coronary Ca2+.   COPD (chronic obstructive pulmonary disease) (HCC)    Depression    Diastolic dysfunction    a. 04/2017 Echo: EF 60-65%, Gr1 DD, nl RV fxn.   Erectile dysfunction    Fall 2005   fell off house: torn aorta, clavicle fracture, rib fracture, vertebral fractures, lung contusion, coma x 2 weeks   Fracture of femoral neck, left (HCC) 08/09/2014   ORIF, Dr. Ernest Pine   Fracture of radial neck, left, closed 08/09/2014   Gallstone pancreatitis    Gastric ulcer    History of ATN 08/09/2014   [facility], 2 days of dialysis (ARF)   Hyperlipidemia    Statin with joint pain    Hypertension    Obesity    Peripheral vascular disease (HCC)    Atherosclerotic:R renal artery stenosis   Type II diabetes mellitus (HCC)    Upper GI bleed    a. 04/2017 admit w/ melena and HGB down to 7 req prbc's; b. 04/2017 EGD: small HH, non-bleeding gastric & duod ulcers, non-bleeding erosive gastropathy-->PPI Rx.   Wears dentures    full upper and lower    Past Surgical History:  Procedure Laterality Date   CARDIAC CATHETERIZATION  08/1991   50 % mid-Lad stenosis with clot, 25-505 second marginal   CARDIOVASCULAR SURGERY     with ruptured Aorta, Dr. Meyer Russel, [facility]    CHOLECYSTECTOMY     ESOPHAGOGASTRODUODENOSCOPY (EGD) WITH PROPOFOL N/A 05/03/2017   Procedure: ESOPHAGOGASTRODUODENOSCOPY (EGD) WITH PROPOFOL;  Surgeon: Midge Minium, MD;  Location: [facility] ENDOSCOPY;  Service: Endoscopy;  Laterality: N/A;   ESOPHAGOGASTRODUODENOSCOPY (EGD) WITH PROPOFOL N/A 11/26/2022   Procedure: ESOPHAGOGASTRODUODENOSCOPY (EGD) WITH PROPOFOL;  Surgeon: Midge Minium, MD;  Location: [facility] ENDOSCOPY;  Service: Endoscopy;  Laterality: N/A;   KNEE SURGERY     Right    ORIF HIP FRACTURE Left 08/12/2014   Dr. Ernest Pine   THORACOTOMY     thoracic aorta repair    TRACHEOSTOMY     s/p reversal   VIDEO BRONCHOSCOPY Bilateral 05/27/2017   Procedure: VIDEO BRONCHOSCOPY WITHOUT FLUORO;  Surgeon: Lupita Leash, MD;  Location: [facility] ENDOSCOPY;  Service: Cardiopulmonary;  Laterality: Bilateral;    Prior to Admission medications   Medication Sig Start Date End Date Taking? Authorizing Provider  albuterol (VENTOLIN HFA) 108 (90 Base) MCG/ACT inhaler Inhale 2 puffs into the lungs every 6 (six) hours as needed for wheezing or shortness of breath. 03/06/21  Yes Copland, Karleen Hampshire, MD  amLODipine (NORVASC) 10 MG tablet TAKE 1 TABLET BY MOUTH DAILY 07/02/23  Yes Copland, Karleen Hampshire, MD  aspirin 81 MG chewable tablet Chew 81 mg by mouth daily.   Yes [provider]  atorvastatin (LIPITOR) 40 MG tablet TAKE ONE TABLET BY MOUTH EVERY DAY 04/19/23  Yes Copland, Karleen Hampshire, MD  citalopram (CELEXA) 10 MG tablet TAKE ONE TABLET BY MOUTH EVERY DAY 06/23/23  Yes Copland, Karleen Hampshire, MD  donepezil (  ARICEPT) 5 MG tablet TAKE 1 TABLET BY MOUTH AT BEDTIME 06/23/23  Yes Copland, Spencer, MD  dorzolamide-timolol (COSOPT) 22.3-6.8 MG/ML ophthalmic solution 1 drop 2 (two) times daily. 07/09/21  Yes [provider]  Fluticasone-Umeclidin-Vilant (TRELEGY ELLIPTA) 100-62.5-25 MCG/ACT AEPB INHALE 1 PUFF INTO THE LUNGS DAILY AS DIRECTED 12/08/22  Yes McQuaid, Brooke Pace, MD  Glycerin, PF, (OPTASE DRY EYE INTENSE) 0.2 % SOLN Apply to eye 4 (four) times daily as needed.   Yes [provider]  latanoprost (XALATAN) 0.005 % ophthalmic solution Place 1 drop into the left eye at  bedtime. 07/28/22  Yes [provider]  metFORMIN (GLUCOPHAGE-XR) 500 MG 24 hr tablet TAKE 3 TABLETS BY MOUTH DAILY Patient taking differently: Takes 1 tab 3 times daily (with meals) 05/17/23  Yes Copland, Karleen Hampshire, MD  nitroGLYCERIN (NITROSTAT) 0.4 MG SL tablet Place 1 tablet (0.4 mg total) under the tongue every 5 (five) minutes as needed for chest pain. 05/19/17  Yes Creig Hines, NP    Allergies as of 09/10/2023 - Review Complete 04/19/2023  Allergen Reaction Noted   Contrast media [iodinated contrast media] Hives 05/19/2017   Penicillins Swelling and Other (See Comments) 08/16/2008   Xarelto [rivaroxaban] Other (See Comments) 05/02/2017   Clindamycin/lincomycin Rash 07/05/2015   Dye fdc red [red dye #40 (allura red)] Rash 05/24/2012   Lovenox [enoxaparin sodium] Rash 09/12/2014    Family History  Problem Relation Age of Onset   Dementia Mother 74   Heart attack Father 1   Diabetes Brother 5   Heart attack Brother    Diabetes Brother    Colon cancer Neg Hx    Stomach cancer Neg Hx    Esophageal cancer Neg Hx    Rectal cancer Neg Hx     Social History   Socioeconomic History   Marital status: Married    Spouse name: Not on file   Number of children: Not on file   Years of education: Not on file   Highest education level: Not on file  Occupational History   Occupation: Build in Dealer: self employed  Tobacco Use   Smoking status: Former    Current packs/day: 0.00    Average packs/day: 1.5 packs/day for 20.0 years (30.0 ttl pk-yrs)    Types: Cigarettes    Start date: 09/13/1971    Quit date: 09/13/1991    Years since quitting: 32.0   Smokeless tobacco: Never   Tobacco comments:    quit post MI  Vaping Use   Vaping status: Never Used  Substance and Sexual Activity   Alcohol use: Not Currently    Comment: 1-2 cansof beer per month   Drug use: No   Sexual activity: Not on file  Other Topics Concern   Not on file  Social  History Narrative   No regular exercise    Social Determinants of Health   Financial Resource Strain: Low Risk  (01/15/2023)   Overall Financial Resource Strain (CARDIA)    Difficulty of Paying Living Expenses: Not hard at all  Food Insecurity: No Food Insecurity (01/15/2023)   Hunger Vital Sign    Worried About Running Out of Food in the Last Year: Never true    Ran Out of Food in the Last Year: Never true  Transportation Needs: No Transportation Needs (01/15/2023)   PRAPARE - Administrator, Civil Service (Medical): No    Lack of Transportation (Non-Medical): No  Physical Activity: Insufficiently Active (01/15/2023)   Exercise  Vital Sign    Days of Exercise per Week: 1 day    Minutes of Exercise per Session: 10 min  Stress: No Stress Concern Present (01/15/2023)   [employer] of Occupational Health - Occupational Stress Questionnaire    Feeling of Stress : Not at all  Social Connections: Moderately Integrated (01/14/2022)   Social Connection and Isolation Panel [NHANES]    Frequency of Communication with Friends and Family: More than three times a week    Frequency of Social Gatherings with Friends and Family: More than three times a week    Attends Religious Services: More than 4 times per year    Active Member of [employer] or Organizations: No    Attends Banker Meetings: Never    Marital Status: Married  Catering manager Violence: Not At Risk (08/05/2022)   Humiliation, Afraid, Rape, and Kick questionnaire    Fear of Current or Ex-Partner: No    Emotionally Abused: No    Physically Abused: No    Sexually Abused: No    Review of Systems: See HPI, otherwise negative ROS  Physical Exam: BP (!) 146/72   Pulse 64   Temp (!) 97.3 F (36.3 C) (Temporal)   Resp 18   Ht 5\' 9"  (1.753 m)   Wt 88.5 kg   SpO2 93%   BMI 28.80 kg/m  General:   Alert,  pleasant and cooperative in NAD Head:  Normocephalic and atraumatic. Lungs:  Clear to auscultation.     Heart:  Regular rate and rhythm. \  Impression/Plan: [name] is here for ophthalmic surgery.  Risks, benefits, limitations, and alternatives regarding ophthalmic surgery have been reviewed with the patient.  Questions have been answered.  All parties agreeable.   Lockie Mola, MD  09/29/2023, 9:18 AM

## 2023-09-30 ENCOUNTER — Encounter: Payer: Self-pay | Admitting: Ophthalmology

## 2023-10-06 NOTE — Anesthesia Preprocedure Evaluation (Addendum)
Anesthesia Evaluation  Patient identified by MRN, date of birth, ID band Patient awake    Reviewed: Allergy & Precautions, H&P , NPO status , Patient's Chart, lab work & pertinent test results  Airway Mallampati: III  TM Distance: >3 FB Neck ROM: Full    Dental no notable dental hx. (+) Edentulous Lower, Edentulous Upper   Pulmonary neg pulmonary ROS, sleep apnea , COPD, former smoker   Pulmonary exam normal breath sounds clear to auscultation       Cardiovascular hypertension, + angina  + CAD, + Peripheral Vascular Disease and +CHF  negative cardio ROS Normal cardiovascular exam Rhythm:Regular Rate:Normal  2005: Aortic transection     Comment:  Traumatic after a fall from a second floor. s/p repair               at [facility] No date: Basal cell carcinoma 1992: CAD (coronary artery disease)     Comment:  a. 1992 Acute anterior MI, thrombolytic therapy-->cath               reportedly w/o significant CAD; b. 08/2016 MV: EF 57%,               hypertensive respons, no ischemia/infarct; c. 04/2017 CTA               chest w/ coronary Ca2+.     04/2017 Echo: EF 60-65%, Gr1 DD, nl RV fxn.   2005: Fall     Comment:  fell off house: torn aorta, clavicle fracture, rib               fracture, vertebral fractures, lung contusion, coma x 2               weeks 08/09/2014: Fracture of femoral neck, left (HCC)     Comment:  ORIF, Dr. Ernest Pine 08/09/2014: Fracture of radial neck, left, closed 08/09/2014: History of ATN     Comment:  [facility], 2 days of dialysis (ARF) No date: Hyperlipidemia     Comment:  Statin with joint pain  No date: Hypertension No date: Obesity No date: Peripheral vascular disease (HCC)     Comment:  Atherosclerotic:R renal artery stenosis       Comment:  a. 04/2017 admit w/ melena and HGB down to 7 req prbc's;               b. 04/2017 EGD: small HH, non-bleeding gastric & duod               ulcers, non-bleeding erosive  gastropathy-->PPI Rx.   Past Surgical History: 08/1991: CARDIAC CATHETERIZATION     Comment:  50 % mid-Lad stenosis with clot, 25-505 second marginal No date: CARDIOVASCULAR SURGERY     Comment:  with ruptured Aorta, Dr. Meyer Russel, [facility]         Neuro/Psych  PSYCHIATRIC DISORDERS  Depression   Dementia negative neurological ROS  negative psych ROS   GI/Hepatic negative GI ROS, Neg liver ROS, PUD,,,  Endo/Other  negative endocrine ROSdiabetes    Renal/GU negative Renal ROS  negative genitourinary   Musculoskeletal negative musculoskeletal ROS (+)    Abdominal   Peds negative pediatric ROS (+)  Hematology negative hematology ROS (+)   Anesthesia Other Findings Previous cataract surgery 09-29-23  Depression  Hyperlipidemia Hypertension Peripheral vascular disease  Fall  Erectile dysfunction Aortic transection  Gallstone pancreatitis Basal cell carcinoma  Fracture of femoral neck, left (HCC) Fracture of radial neck, left, closed History of ATN Gastric ulcer  CAD (  coronary artery disease) Diastolic dysfunction  Type II diabetes mellitus  Obesity  Upper GI bleed Wears dentures  COPD (chronic obstructive pulmonary disease)  Grade I diastolic dysfunction     Reproductive/Obstetrics negative OB ROS                             Anesthesia Physical Anesthesia Plan  ASA: 4  Anesthesia Plan: MAC   Post-op Pain Management:    Induction: Intravenous  PONV Risk Score and Plan:   Airway Management Planned: Natural Airway and Nasal Cannula  Additional Equipment:   Intra-op Plan:   Post-operative Plan:   Informed Consent: I have reviewed the patients History and Physical, chart, labs and discussed the procedure including the risks, benefits and alternatives for the proposed anesthesia with the patient or authorized representative who has indicated his/her understanding and acceptance.     Dental Advisory Given  Plan Discussed  with: Anesthesiologist, CRNA and Surgeon  Anesthesia Plan Comments: (Patient consented for risks of anesthesia including but not limited to:  - adverse reactions to medications - damage to eyes, teeth, lips or other oral mucosa - nerve damage due to positioning  - sore throat or hoarseness - Damage to heart, brain, nerves, lungs, other parts of body or loss of life  Patient voiced understanding and assent.)        Anesthesia Quick Evaluation

## 2023-10-11 NOTE — Discharge Instructions (Signed)

## 2023-10-13 ENCOUNTER — Encounter
Admission: RE | Disposition: A | Discharge status: Home / Self Care | Payer: Self-pay | Source: Home / Self Care | Attending: Ophthalmology

## 2023-10-13 ENCOUNTER — Other Ambulatory Visit: Payer: Self-pay

## 2023-10-13 ENCOUNTER — Ambulatory Visit
Admission: RE | Admit: 2023-10-13 | Discharge: 2023-10-13 | Disposition: A | Discharge status: Home / Self Care | Payer: PPO | Attending: Ophthalmology | Admitting: Ophthalmology

## 2023-10-13 ENCOUNTER — Ambulatory Visit: Payer: PPO | Admitting: Anesthesiology

## 2023-10-13 DIAGNOSIS — E1136 Type 2 diabetes mellitus with diabetic cataract: Secondary | ICD-10-CM | POA: Insufficient documentation

## 2023-10-13 DIAGNOSIS — H401112 Primary open-angle glaucoma, right eye, moderate stage: Secondary | ICD-10-CM | POA: Diagnosis not present

## 2023-10-13 DIAGNOSIS — H2511 Age-related nuclear cataract, right eye: Secondary | ICD-10-CM | POA: Insufficient documentation

## 2023-10-13 DIAGNOSIS — I25110 Atherosclerotic heart disease of native coronary artery with unstable angina pectoris: Secondary | ICD-10-CM | POA: Diagnosis not present

## 2023-10-13 DIAGNOSIS — I5032 Chronic diastolic (congestive) heart failure: Secondary | ICD-10-CM | POA: Diagnosis not present

## 2023-10-13 DIAGNOSIS — I110 Hypertensive heart disease with heart failure: Secondary | ICD-10-CM | POA: Diagnosis not present

## 2023-10-13 DIAGNOSIS — Z87891 Personal history of nicotine dependence: Secondary | ICD-10-CM | POA: Diagnosis not present

## 2023-10-13 DIAGNOSIS — H269 Unspecified cataract: Secondary | ICD-10-CM | POA: Diagnosis not present

## 2023-10-13 HISTORY — PX: CATARACT EXTRACTION W/PHACO: SHX586

## 2023-10-13 LAB — GLUCOSE, CAPILLARY: Glucose-Capillary: 103 mg/dL — ABNORMAL HIGH (ref 70–99)

## 2023-10-13 SURGERY — CATARACT EXTRACTION PHACO AND INTRAOCULAR LENS PLACEMENT (IOC)
Anesthesia: Monitor Anesthesia Care | Laterality: Right

## 2023-10-13 MED ORDER — SIGHTPATH DOSE#1 NA CHONDROIT SULF-NA HYALURON 20-15 MG/0.5ML IO SOSY
INTRAOCULAR | Status: DC | PRN
  Administered 2023-10-13: .5 mL via INTRAOCULAR

## 2023-10-13 MED ORDER — ARMC OPHTHALMIC DILATING DROPS
OPHTHALMIC | Status: AC
  Filled 2023-10-13: qty 0.5

## 2023-10-13 MED ORDER — SIGHTPATH DOSE#1 NA HYALUR & NA CHOND-NA HYALUR IO KIT
PACK | INTRAOCULAR | Status: DC | PRN
  Administered 2023-10-13: 1 via OPHTHALMIC

## 2023-10-13 MED ORDER — SIGHTPATH DOSE#1 BSS IO SOLN
INTRAOCULAR | Status: DC | PRN
  Administered 2023-10-13: 15 mL via INTRAOCULAR

## 2023-10-13 MED ORDER — TETRACAINE HCL 0.5 % OP SOLN
1.0000 [drp] | OPHTHALMIC | Status: DC | PRN
  Administered 2023-10-13 (×3): 1 [drp] via OPHTHALMIC

## 2023-10-13 MED ORDER — SODIUM CHLORIDE 0.9% FLUSH
INTRAVENOUS | Status: DC | PRN
  Administered 2023-10-13: 10 mL via INTRAVENOUS

## 2023-10-13 MED ORDER — MIDAZOLAM HCL 2 MG/2ML IJ SOLN
INTRAMUSCULAR | Status: DC | PRN
  Administered 2023-10-13: 1 mg via INTRAVENOUS

## 2023-10-13 MED ORDER — TETRACAINE HCL 0.5 % OP SOLN
OPHTHALMIC | Status: AC
  Filled 2023-10-13: qty 4

## 2023-10-13 MED ORDER — MOXIFLOXACIN HCL 0.5 % OP SOLN
OPHTHALMIC | Status: DC | PRN
  Administered 2023-10-13: .2 mL via OPHTHALMIC

## 2023-10-13 MED ORDER — MIDAZOLAM HCL 2 MG/2ML IJ SOLN
INTRAMUSCULAR | Status: AC
  Filled 2023-10-13: qty 2

## 2023-10-13 MED ORDER — SIGHTPATH DOSE#1 BSS IO SOLN
INTRAOCULAR | Status: DC | PRN
  Administered 2023-10-13: 2 mL

## 2023-10-13 MED ORDER — FENTANYL CITRATE (PF) 100 MCG/2ML IJ SOLN
INTRAMUSCULAR | Status: AC
  Filled 2023-10-13: qty 2

## 2023-10-13 MED ORDER — ARMC OPHTHALMIC DILATING DROPS
1.0000 | OPHTHALMIC | Status: DC | PRN
  Administered 2023-10-13 (×3): 1 via OPHTHALMIC

## 2023-10-13 MED ORDER — FENTANYL CITRATE (PF) 100 MCG/2ML IJ SOLN
INTRAMUSCULAR | Status: DC | PRN
  Administered 2023-10-13: 25 ug via INTRAVENOUS

## 2023-10-13 MED ORDER — SIGHTPATH DOSE#1 BSS IO SOLN
INTRAOCULAR | Status: DC | PRN
  Administered 2023-10-13: 71 mL via OPHTHALMIC

## 2023-10-13 SURGICAL SUPPLY — 10 items
BLADE DUAL KAHOOK SINGLE USE (BLADE) ×1
CATARACT SUITE SIGHTPATH (MISCELLANEOUS) ×1
GLOVE SRG 8 PF TXTR STRL LF DI (GLOVE) ×1
GLOVE SURG ENC TEXT LTX SZ7.5 (GLOVE) ×1
GLOVE SURG UNDER POLY LF SZ8 (GLOVE) ×1
ICLIP (OPHTHALMIC RELATED) ×1
LENS IOL TECNIS EYHANCE 15.5 (Intraocular Lens) ×1 IMPLANT
NDL FILTER BLUNT 18X1 1/2 (NEEDLE) ×1 IMPLANT
NEEDLE FILTER BLUNT 18X1 1/2 (NEEDLE) ×1
SYR 3ML LL SCALE MARK (SYRINGE) ×1

## 2023-10-13 NOTE — Op Note (Signed)
PREOPERATIVE DIAGNOSIS:  Nuclear sclerotic cataract  right eye. 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 Item Serial No. Manufacturer Lot No. LRB No. Used Action  LENS IOL TECNIS EYHANCE 15.5 - [id number] Intraocular Lens LENS IOL TECNIS EYHANCE 15.5 [id number] [employer]  Right 1 Implanted    SURGEON:  Deirdre Evener, MD   ANESTHESIA:  Topical with tetracaine drops augmented with 1% preservative-free intracameral lidocaine.    COMPLICATIONS:  None.   DESCRIPTION OF PROCEDURE:  The patient was identified in the holding room and transported to the operating room and placed in the supine position under the operating microscope.  The right eye was identified as the operative eye and it was prepped and draped in the usual sterile ophthalmic fashion.   A 1 millimeter clear-corneal paracentesis was made at the 12:00 position.  0.5 ml of preservative-free 1% lidocaine was injected into the anterior chamber.  The anterior chamber was filled with Viscoat viscoelastic.  A 2.4 millimeter keratome was used to make a near-clear corneal incision at the 9:00 position. The microscope was adjusted and a gonioprism was used to visulaize the trabecular meshwork.  The [facility] Dual Blade was advanced across the anterior chamber under viscoelastic.  The blade was used to mark the trabecular meshwork at the 1:30 position.  The blade was placed two clock hours clockwise into the meshwork.  Proper postioning was confirmed.  The  blade ws passed counterclockwise through the meshwork to excise approximately two to three clock-hours of trabecular meshwork.   A curvilinear capsulorrhexis was made with a cystotome and capsulorrhexis forceps.  Balanced salt solution was used to hydrodissect and hydrodelineate the nucleus.   Phacoemulsification was then used in stop and chop fashion to remove the lens nucleus and epinucleus.  The remaining cortex was then removed using the irrigation and aspiration handpiece. Provisc was then placed into the capsular bag to distend it for lens placement.  A lens was then injected into the capsular bag.  The remaining viscoelastic was aspirated.   Wounds were hydrated with balanced salt solution.  The anterior chamber was inflated to a physiologic pressure with balanced salt solution.  No wound leaks were noted. Vigamox 0.2 ml of a 1mg  per ml solution was injected into the anterior chamber for a dose of 0.2 mg of intracameral antibiotic at the completion of the case.  The patient was taken to the recovery room in stable condition without complications of anesthesia or surgery.

## 2023-10-13 NOTE — Transfer of Care (Signed)
Immediate Anesthesia Transfer of Care Note  Patient: [name]  Procedure(s) Performed: CATARACT EXTRACTION PHACO AND INTRAOCULAR LENS PLACEMENT (IOC) RIGHT DIABETIC [place] DUAL BLADE GONIOTOMY 13.19 00:53.8 (Right)  Patient Location: PACU  Anesthesia Type: MAC  Level of Consciousness: awake, alert  and patient cooperative  Airway and Oxygen Therapy: Patient Spontanous Breathing and Patient connected to supplemental oxygen  Post-op Assessment: Post-op Vital signs reviewed, Patient's Cardiovascular Status Stable, Respiratory Function Stable, Patent Airway and No signs of Nausea or vomiting  Post-op Vital Signs: Reviewed and stable  Complications: No notable events documented.

## 2023-10-13 NOTE — Anesthesia Postprocedure Evaluation (Signed)
Anesthesia Post Note  Patient: [name]  Procedure(s) Performed: CATARACT EXTRACTION PHACO AND INTRAOCULAR LENS PLACEMENT (IOC) RIGHT DIABETIC [place] DUAL BLADE GONIOTOMY 13.19 00:53.8 (Right)  Patient location during evaluation: PACU Anesthesia Type: MAC Level of consciousness: awake and alert Pain management: pain level controlled Vital Signs Assessment: post-procedure vital signs reviewed and stable Respiratory status: spontaneous breathing, nonlabored ventilation, respiratory function stable and patient connected to nasal cannula oxygen Cardiovascular status: stable and blood pressure returned to baseline Postop Assessment: no apparent nausea or vomiting Anesthetic complications: no   No notable events documented.   Last Vitals:  Vitals:   10/13/23 0800 10/13/23 0802  BP: 107/64 106/63  Pulse: (!) 54 (!) 51  Resp: 14 12  Temp:  36.5 C  SpO2: 91% 95%    Last Pain:  Vitals:   10/13/23 0802  TempSrc:   PainSc: 0-No pain                 Araya Roel C Ottilia Pippenger

## 2023-10-13 NOTE — H&P (Signed)
[facility]   Primary Care Physician:  Hannah Beat, MD Ophthalmologist: Dr. Lockie Mola  Pre-Procedure History & Physical: HPI:  [name] is a 77 y.o. male here for ophthalmic surgery.   Past Medical History:  Diagnosis Date   Aortic transection 2005   Traumatic after a fall from a second floor. s/p repair at [facility]   Basal cell carcinoma    CAD (coronary artery disease) 1992   a. 1992 Acute anterior MI, thrombolytic therapy-->cath reportedly w/o significant CAD; b. 08/2016 MV: EF 57%, hypertensive respons, no ischemia/infarct; c. 04/2017 CTA chest w/ coronary Ca2+.   COPD (chronic obstructive pulmonary disease) (HCC)    Depression    Diastolic dysfunction    a. 04/2017 Echo: EF 60-65%, Gr1 DD, nl RV fxn.   Erectile dysfunction    Fall 2005   fell off house: torn aorta, clavicle fracture, rib fracture, vertebral fractures, lung contusion, coma x 2 weeks   Fracture of femoral neck, left (HCC) 08/09/2014   ORIF, Dr. Ernest Pine   Fracture of radial neck, left, closed 08/09/2014   Gallstone pancreatitis    Gastric ulcer    Grade I diastolic dysfunction    History of ATN 08/09/2014   [facility], 2 days of dialysis (ARF)   Hyperlipidemia    Statin with joint pain    Hypertension    Obesity    Peripheral vascular disease (HCC)    Atherosclerotic:R renal artery stenosis   Type II diabetes mellitus (HCC)    Upper GI bleed    a. 04/2017 admit w/ melena and HGB down to 7 req prbc's; b. 04/2017 EGD: small HH, non-bleeding gastric & duod ulcers, non-bleeding erosive gastropathy-->PPI Rx.   Wears dentures    full upper and lower    Past Surgical History:  Procedure Laterality Date   CARDIAC CATHETERIZATION  08/1991   50 % mid-Lad stenosis with clot, 25-505 second marginal   CARDIOVASCULAR SURGERY     with ruptured Aorta, Dr. Meyer Russel, [facility]    CATARACT EXTRACTION W/PHACO Left 09/29/2023   Procedure: CATARACT EXTRACTION PHACO AND INTRAOCULAR LENS PLACEMENT (IOC) LEFT  DIABETIC KAHOOK DUAL BLADE GONIOTOMY 13.75 01:00.5;  Surgeon: Lockie Mola, MD;  Location: [facility];  Service: Ophthalmology;  Laterality: Left;   CHOLECYSTECTOMY     ESOPHAGOGASTRODUODENOSCOPY (EGD) WITH PROPOFOL N/A 05/03/2017   Procedure: ESOPHAGOGASTRODUODENOSCOPY (EGD) WITH PROPOFOL;  Surgeon: Midge Minium, MD;  Location: [facility] ENDOSCOPY;  Service: Endoscopy;  Laterality: N/A;   ESOPHAGOGASTRODUODENOSCOPY (EGD) WITH PROPOFOL N/A 11/26/2022   Procedure: ESOPHAGOGASTRODUODENOSCOPY (EGD) WITH PROPOFOL;  Surgeon: Midge Minium, MD;  Location: [facility] ENDOSCOPY;  Service: Endoscopy;  Laterality: N/A;   KNEE SURGERY     Right    ORIF HIP FRACTURE Left 08/12/2014   Dr. Ernest Pine   THORACOTOMY     thoracic aorta repair    TRACHEOSTOMY     s/p reversal   VIDEO BRONCHOSCOPY Bilateral 05/27/2017   Procedure: VIDEO BRONCHOSCOPY WITHOUT FLUORO;  Surgeon: Lupita Leash, MD;  Location: [facility] ENDOSCOPY;  Service: Cardiopulmonary;  Laterality: Bilateral;    Prior to Admission medications   Medication Sig Start Date End Date Taking? Authorizing Provider  albuterol (VENTOLIN HFA) 108 (90 Base) MCG/ACT inhaler Inhale 2 puffs into the lungs every 6 (six) hours as needed for wheezing or shortness of breath. 03/06/21  Yes Copland, Karleen Hampshire, MD  amLODipine (NORVASC) 10 MG tablet TAKE 1 TABLET BY MOUTH DAILY 07/02/23  Yes Copland, Karleen Hampshire, MD  aspirin 81 MG chewable tablet Chew 81 mg  by mouth daily.   Yes [provider]  atorvastatin (LIPITOR) 40 MG tablet TAKE ONE TABLET BY MOUTH EVERY DAY 04/19/23  Yes Copland, Karleen Hampshire, MD  citalopram (CELEXA) 10 MG tablet TAKE ONE TABLET BY MOUTH EVERY DAY 06/23/23  Yes Copland, Karleen Hampshire, MD  donepezil (ARICEPT) 5 MG tablet TAKE 1 TABLET BY MOUTH AT BEDTIME 06/23/23  Yes Copland, Spencer, MD  dorzolamide-timolol (COSOPT) 22.3-6.8 MG/ML ophthalmic solution 1 drop 2 (two) times daily. 07/09/21  Yes [provider]  Fluticasone-Umeclidin-Vilant (TRELEGY  ELLIPTA) 100-62.5-25 MCG/ACT AEPB INHALE 1 PUFF INTO THE LUNGS DAILY AS DIRECTED 12/08/22  Yes McQuaid, Brooke Pace, MD  Glycerin, PF, (OPTASE DRY EYE INTENSE) 0.2 % SOLN Apply to eye 4 (four) times daily as needed.   Yes [provider]  latanoprost (XALATAN) 0.005 % ophthalmic solution Place 1 drop into the left eye at bedtime. 07/28/22  Yes [provider]  metFORMIN (GLUCOPHAGE-XR) 500 MG 24 hr tablet TAKE 3 TABLETS BY MOUTH DAILY Patient taking differently: Takes 1 tab 3 times daily (with meals) 05/17/23  Yes Copland, Karleen Hampshire, MD  nitroGLYCERIN (NITROSTAT) 0.4 MG SL tablet Place 1 tablet (0.4 mg total) under the tongue every 5 (five) minutes as needed for chest pain. 05/19/17   Creig Hines, NP    Allergies as of 09/10/2023 - Review Complete 04/19/2023  Allergen Reaction Noted   Contrast media [iodinated contrast media] Hives 05/19/2017   Penicillins Swelling and Other (See Comments) 08/16/2008   Xarelto [rivaroxaban] Other (See Comments) 05/02/2017   Clindamycin/lincomycin Rash 07/05/2015   Dye fdc red [red dye #40 (allura red)] Rash 05/24/2012   Lovenox [enoxaparin sodium] Rash 09/12/2014    Family History  Problem Relation Age of Onset   Dementia Mother 45   Heart attack Father 88   Diabetes Brother 47   Heart attack Brother    Diabetes Brother    Colon cancer Neg Hx    Stomach cancer Neg Hx    Esophageal cancer Neg Hx    Rectal cancer Neg Hx     Social History   Socioeconomic History   Marital status: Married    Spouse name: Not on file   Number of children: Not on file   Years of education: Not on file   Highest education level: Not on file  Occupational History   Occupation: Build in Dealer: self employed  Tobacco Use   Smoking status: Former    Current packs/day: 0.00    Average packs/day: 1.5 packs/day for 20.0 years (30.0 ttl pk-yrs)    Types: Cigarettes    Start date: 09/13/1971    Quit date: 09/13/1991    Years  since quitting: 32.1   Smokeless tobacco: Never   Tobacco comments:    quit post MI  Vaping Use   Vaping status: Never Used  Substance and Sexual Activity   Alcohol use: Not Currently    Comment: 1-2 cansof beer per month   Drug use: No   Sexual activity: Not on file  Other Topics Concern   Not on file  Social History Narrative   No regular exercise    Social Determinants of Health   Financial Resource Strain: Low Risk  (01/15/2023)   Overall Financial Resource Strain (CARDIA)    Difficulty of Paying Living Expenses: Not hard at all  Food Insecurity: No Food Insecurity (01/15/2023)   Hunger Vital Sign    Worried About Running Out of Food in the Last Year: Never true  Ran Out of Food in the Last Year: Never true  Transportation Needs: No Transportation Needs (01/15/2023)   PRAPARE - Administrator, Civil Service (Medical): No    Lack of Transportation (Non-Medical): No  Physical Activity: Insufficiently Active (01/15/2023)   Exercise Vital Sign    Days of Exercise per Week: 1 day    Minutes of Exercise per Session: 10 min  Stress: No Stress Concern Present (01/15/2023)   [employer] of Occupational Health - Occupational Stress Questionnaire    Feeling of Stress : Not at all  Social Connections: Moderately Integrated (01/14/2022)   Social Connection and Isolation Panel [NHANES]    Frequency of Communication with Friends and Family: More than three times a week    Frequency of Social Gatherings with Friends and Family: More than three times a week    Attends Religious Services: More than 4 times per year    Active Member of [employer] or Organizations: No    Attends Banker Meetings: Never    Marital Status: Married  Catering manager Violence: Not At Risk (08/05/2022)   Humiliation, Afraid, Rape, and Kick questionnaire    Fear of Current or Ex-Partner: No    Emotionally Abused: No    Physically Abused: No    Sexually Abused: No    Review of  Systems: See HPI, otherwise negative ROS  Physical Exam: BP (!) 141/71   Pulse (!) 58   Temp 97.7 F (36.5 C) (Temporal)   Resp 12   Ht 5\' 9"  (1.753 m)   Wt 88.9 kg   SpO2 93%   BMI 28.94 kg/m  General:   Alert,  pleasant and cooperative in NAD Head:  Normocephalic and atraumatic. Lungs:  Clear to auscultation.    Heart:  Regular rate and rhythm.   Impression/Plan: [name] is here for ophthalmic surgery.  Risks, benefits, limitations, and alternatives regarding ophthalmic surgery have been reviewed with the patient.  Questions have been answered.  All parties agreeable.   Lockie Mola, MD  10/13/2023, 7:30 AM

## 2023-10-14 ENCOUNTER — Encounter: Payer: Self-pay | Admitting: Ophthalmology

## 2023-11-09 DIAGNOSIS — Z961 Presence of intraocular lens: Secondary | ICD-10-CM | POA: Diagnosis not present

## 2023-12-20 ENCOUNTER — Telehealth: Payer: Self-pay | Admitting: Pulmonary Disease

## 2023-12-20 MED ORDER — TRELEGY ELLIPTA 100-62.5-25 MCG/ACT IN AEPB: 1.0000 | INHALATION_SPRAY | Freq: Every day | RESPIRATORY_TRACT | 0 refills | Status: DC

## 2023-12-20 NOTE — Telephone Encounter (Signed)
I have sent in one refill of the Trelegy to last until he sees Dr. Larinda Buttery on 1/28. I have notified his wife (DPR).  Nothing further needed.

## 2023-12-20 NOTE — Telephone Encounter (Signed)
[facility] in [place]  PT needs Trelegy. States 3 req were sent by Pharm. PT will be out tomorrow. Wife calling (DPR) Her # is [phone]

## 2023-12-28 ENCOUNTER — Ambulatory Visit (INDEPENDENT_AMBULATORY_CARE_PROVIDER_SITE_OTHER): Payer: PPO | Admitting: Pulmonary Disease

## 2023-12-28 ENCOUNTER — Encounter: Payer: Self-pay | Admitting: Pulmonary Disease

## 2023-12-28 VITALS — BP 128/70 | HR 62 | Temp 96.9°F | Ht 69.0 in | Wt 201.6 lb

## 2023-12-28 DIAGNOSIS — K219 Gastro-esophageal reflux disease without esophagitis: Secondary | ICD-10-CM | POA: Diagnosis not present

## 2023-12-28 DIAGNOSIS — J4489 Other specified chronic obstructive pulmonary disease: Secondary | ICD-10-CM

## 2023-12-28 MED ORDER — TRELEGY ELLIPTA 100-62.5-25 MCG/ACT IN AEPB: 1.0000 | INHALATION_SPRAY | Freq: Once | RESPIRATORY_TRACT | 6 refills | Status: AC

## 2023-12-28 MED ORDER — ALBUTEROL SULFATE HFA 108 (90 BASE) MCG/ACT IN AERS: 2.0000 | INHALATION_SPRAY | Freq: Four times a day (QID) | RESPIRATORY_TRACT | 2 refills | Status: AC | PRN

## 2023-12-28 NOTE — Progress Notes (Signed)
Synopsis: Referred in by Hannah Beat, MD   Subjective:   PATIENT ID: Bennetta Laos Dines GENDER: male DOB: May 29, 1946, MRN: [id number]  Chief Complaint  Patient presents with   Follow-up    DOE. No wheezing. Occasional cough.    HPI Mr. Barse is a 78 year old male patient with a past medical history of mild COPD, esophageal stricture status post balloon dilation in 2023 presenting today to the pulmonary clinic for follow-up on his COPD.  He last saw Dr. Kendrick Fries in 2023 and at the time he was doing well on Trelegy.  PFTs in 2023 consistent with COPD stage I group A.  With possible response to bronchodilators.  He presents today to establish care he is doing well no major complaints.  No breathing issues.  Has not required to use his rescue inhaler at all for the past 4 weeks.  Has not been hospitalized for any respiratory issues for the past year.   CT chest 2023 with significant upper lobe predominant emphysema.  Subcarinal nodal enlargement noted.  Social history -quit smoking 25 years ago smoked 1 pack/day for 30 years.  ROS All symptoms were reviewed and are neagtive except for the above. Objective:   Vitals:   12/28/23 0841  BP: 128/70  Pulse: 62  Temp: (!) 96.9 F (36.1 C)  SpO2: 96%  Weight: 201 lb 9.6 oz (91.4 kg)  Height: 5\' 9"  (1.753 m)   96% on RA BMI Readings from Last 3 Encounters:  12/28/23 29.77 kg/m  10/13/23 28.94 kg/m  09/29/23 28.80 kg/m   Wt Readings from Last 3 Encounters:  12/28/23 201 lb 9.6 oz (91.4 kg)  10/13/23 196 lb (88.9 kg)  09/29/23 195 lb (88.5 kg)    Physical Exam GEN: NAD, Healthy Appearing HEENT: Supple Neck, Reactive Pupils, EOMI  CVS: Normal S1, Normal S2, RRR, No murmurs or ES appreciated  Lungs: Clear bilateral air entry.  Abdomen: Soft, non tender, non distended, + BS  Extremities: Warm and well perfused, No edema  Skin: No suspicious lesions appreciated  Psych: Normal Affect  Ancillary Information   CBC     Component Value Date/Time   WBC 5.6 04/13/2023 0835   RBC 4.86 04/13/2023 0835   HGB 14.8 04/13/2023 0835   HGB 10.6 (L) 08/23/2014 0619   HCT 43.0 04/13/2023 0835   HCT 31.5 (L) 08/23/2014 0619   PLT 170.0 04/13/2023 0835   PLT 328 08/23/2014 0619   MCV 88.5 04/13/2023 0835   MCV 92 08/23/2014 0619   MCH 29.7 08/06/2022 0424   MCHC 34.4 04/13/2023 0835   RDW 13.7 04/13/2023 0835   RDW 14.3 08/23/2014 0619   LYMPHSABS 1.3 04/13/2023 0835   LYMPHSABS 1.0 08/23/2014 0619   MONOABS 0.4 04/13/2023 0835   MONOABS 0.5 08/23/2014 0619   EOSABS 0.2 04/13/2023 0835   EOSABS 0.3 08/23/2014 0619   BASOSABS 0.1 04/13/2023 0835   BASOSABS 0.0 08/23/2014 0619    Labs and imaging were reviewed.    Latest Ref Rng & Units 10/20/2022    2:50 PM  PFT Results  FVC-Pre L 4.08   FVC-Predicted Pre % 104   FVC-Post L 4.20   FVC-Predicted Post % 107   Pre FEV1/FVC % % 67   Post FEV1/FCV % % 69   FEV1-Pre L 2.74   FEV1-Predicted Pre % 97   FEV1-Post L 2.88   DLCO uncorrected ml/min/mmHg 15.72   DLCO UNC% % 66   DLCO corrected ml/min/mmHg 15.72   DLCO COR %Predicted %  66   DLVA Predicted % 67   TLC L 6.50   TLC % Predicted % 97   RV % Predicted % 96      Assessment & Plan:  Mr. Christofferson is a 78 year old male patient with a past medical history of mild COPD, esophageal stricture status post balloon dilation in 2023 presenting today to the pulmonary clinic for follow-up on his COPD.  # COPD stage I group A CAT 8  []  Continue with fluticasone-umeclidinium-vilanterol [Trelegy] 1 puff once a day. []  Continue with albuterol as needed. []  Discussed pulmonary rehab and he would like to think about it and discuss it with his son prior to commitment. []  Stay physically active and exercise regularly.  # GERD # Esophageal stricture status post balloon dilation in 2023 by Dr. Servando Snare   Return in about 6 months (around 06/26/2024).  I spent 60 minutes caring for this patient today, including  preparing to see the patient, obtaining a medical history , reviewing a separately obtained history, performing a medically appropriate examination and/or evaluation, counseling and educating the patient/family/caregiver, ordering medications, tests, or procedures, documenting clinical information in the electronic health record, and independently interpreting results (not separately reported/billed) and communicating results to the patient/family/caregiver  Janann Colonel, MD [facility] 12/28/2023 9:09 AM

## 2024-01-19 ENCOUNTER — Ambulatory Visit (INDEPENDENT_AMBULATORY_CARE_PROVIDER_SITE_OTHER): Payer: PPO

## 2024-01-19 VITALS — Ht 70.0 in | Wt 195.0 lb

## 2024-01-19 DIAGNOSIS — Z0000 Encounter for general adult medical examination without abnormal findings: Secondary | ICD-10-CM

## 2024-01-19 NOTE — Patient Instructions (Signed)
[name] , Thank you for taking time to come for your Medicare Wellness Visit. I appreciate your ongoing commitment to your health goals. Please review the following plan we discussed and let me know if I can assist you in the future.   Referrals/Orders/Follow-Ups/Clinician Recommendations: none  This is a list of the screening recommended for you and due dates:  Health Maintenance  Topic Date Due   Zoster (Shingles) Vaccine (1 of 2) Never done   COVID-19 Vaccine (3 - Pfizer risk series) 02/29/2020   Flu Shot  07/01/2023   Hemoglobin A1C  10/14/2023   Yearly kidney function blood test for diabetes  04/12/2024   Yearly kidney health urinalysis for diabetes  04/18/2024   Complete foot exam   04/18/2024   Eye exam for diabetics  08/29/2024   Medicare Annual Wellness Visit  01/18/2025   DTaP/Tdap/Td vaccine (2 - Tdap) 06/30/2025   Pneumonia Vaccine  Completed   Hepatitis C Screening  Completed   HPV Vaccine  Aged Out   Colon Cancer Screening  Discontinued    Advanced directives: (Copy Requested) Please bring a copy of your health care power of attorney and living will to the office to be added to your chart at your convenience.  Next Medicare Annual Wellness Visit scheduled for next year: Yes 01/19/2025 @ 10:50am televisit

## 2024-01-19 NOTE — Progress Notes (Signed)
Subjective:   [name] is a 78 y.o. male who presents for Medicare Annual/Subsequent preventive examination.  Visit Complete: Virtual I connected with  [name] on 01/19/24 by a audio enabled telemedicine application and verified that I am speaking with the correct person using two identifiers.  Patient Location: Home  Provider Location: Home Office  I discussed the limitations of evaluation and management by telemedicine. The patient expressed understanding and agreed to proceed.  Vital Signs: Because this visit was a virtual/telehealth visit, some criteria may be missing or patient reported. Any vitals not documented were not able to be obtained and vitals that have been documented are patient reported.  Patient Medicare AWV questionnaire was completed by the patient on 01/18/2024; I have confirmed that all information answered by patient is correct and no changes since this date.  Cardiac Risk Factors include: advanced age (>81men, >33 women);dyslipidemia;hypertension;sedentary lifestyle;male gender     Objective:    Today's Vitals   01/19/24 1052  Weight: 195 lb (88.5 kg)  Height: 5\' 10"  (1.778 m)   Body mass index is 27.98 kg/m.     01/19/2024   10:59 AM 10/13/2023    6:38 AM 09/29/2023    9:15 AM 01/15/2023    8:44 AM 11/26/2022    8:29 AM 08/05/2022    5:58 AM 01/14/2022    9:49 AM  Advanced Directives  Does Patient Have a Medical Advance Directive? Yes Yes Yes Yes Yes Yes Yes  Type of Estate agent of [place];Living will Healthcare Power of [place];Living will Healthcare Power of [place];Living will Healthcare Power of [place];Living will Healthcare Power of [place];Living will Living will;Healthcare Power of [employer] Power of [place];Living will  Does patient want to make changes to medical advance directive?  No - Patient declined No - Patient declined   No - Patient declined Yes (MAU/Ambulatory/Procedural Areas -  Information given)  Copy of Healthcare Power of Attorney in Chart? No - copy requested No - copy requested No - copy requested No - copy requested No - copy requested No - copy requested     Current Medications (verified) Outpatient Encounter Medications as of 01/19/2024  Medication Sig   albuterol (VENTOLIN HFA) 108 (90 Base) MCG/ACT inhaler Inhale 2 puffs into the lungs every 6 (six) hours as needed for wheezing or shortness of breath.   albuterol (VENTOLIN HFA) 108 (90 Base) MCG/ACT inhaler Inhale 2 puffs into the lungs every 6 (six) hours as needed for wheezing or shortness of breath.   amLODipine (NORVASC) 10 MG tablet TAKE 1 TABLET BY MOUTH DAILY   aspirin 81 MG chewable tablet Chew 81 mg by mouth daily.   atorvastatin (LIPITOR) 40 MG tablet TAKE ONE TABLET BY MOUTH EVERY DAY   citalopram (CELEXA) 10 MG tablet TAKE ONE TABLET BY MOUTH EVERY DAY   donepezil (ARICEPT) 5 MG tablet TAKE 1 TABLET BY MOUTH AT BEDTIME   dorzolamide-timolol (COSOPT) 22.3-6.8 MG/ML ophthalmic solution 1 drop 2 (two) times daily.   Fluticasone-Umeclidin-Vilant (TRELEGY ELLIPTA) 100-62.5-25 MCG/ACT AEPB Inhale 1 puff into the lungs daily.   latanoprost (XALATAN) 0.005 % ophthalmic solution Place 1 drop into the left eye at bedtime.   metFORMIN (GLUCOPHAGE-XR) 500 MG 24 hr tablet TAKE 3 TABLETS BY MOUTH DAILY (Patient taking differently: Takes 1 tab 3 times daily (with meals))   nitroGLYCERIN (NITROSTAT) 0.4 MG SL tablet Place 1 tablet (0.4 mg total) under the tongue every 5 (five) minutes as needed for chest pain.  Glycerin, PF, (OPTASE DRY EYE INTENSE) 0.2 % SOLN Apply to eye 4 (four) times daily as needed. (Patient not taking: Reported on 12/28/2023)   No facility-administered encounter medications on file as of 01/19/2024.    Allergies (verified) Contrast media [iodinated contrast media], Penicillins, Xarelto [rivaroxaban], Clindamycin/lincomycin, Dye fdc red [red dye #40 (allura red)], and Lovenox [enoxaparin  sodium]   History: Past Medical History:  Diagnosis Date   Aortic transection 2005   Traumatic after a fall from a second floor. s/p repair at [facility]   Basal cell carcinoma    CAD (coronary artery disease) 1992   a. 1992 Acute anterior MI, thrombolytic therapy-->cath reportedly w/o significant CAD; b. 08/2016 MV: EF 57%, hypertensive respons, no ischemia/infarct; c. 04/2017 CTA chest w/ coronary Ca2+.   COPD (chronic obstructive pulmonary disease) (HCC)    Depression    Diastolic dysfunction    a. 04/2017 Echo: EF 60-65%, Gr1 DD, nl RV fxn.   Erectile dysfunction    Fall 2005   fell off house: torn aorta, clavicle fracture, rib fracture, vertebral fractures, lung contusion, coma x 2 weeks   Fracture of femoral neck, left (HCC) 08/09/2014   ORIF, Dr. Ernest Pine   Fracture of radial neck, left, closed 08/09/2014   Gallstone pancreatitis    Gastric ulcer    Grade I diastolic dysfunction    History of ATN 08/09/2014   [facility], 2 days of dialysis (ARF)   Hyperlipidemia    Statin with joint pain    Hypertension    Obesity    Peripheral vascular disease (HCC)    Atherosclerotic:R renal artery stenosis   Type II diabetes mellitus (HCC)    Upper GI bleed    a. 04/2017 admit w/ melena and HGB down to 7 req prbc's; b. 04/2017 EGD: small HH, non-bleeding gastric & duod ulcers, non-bleeding erosive gastropathy-->PPI Rx.   Wears dentures    full upper and lower   Past Surgical History:  Procedure Laterality Date   CARDIAC CATHETERIZATION  08/1991   50 % mid-Lad stenosis with clot, 25-505 second marginal   CARDIOVASCULAR SURGERY     with ruptured Aorta, Dr. Meyer Russel, [facility]    CATARACT EXTRACTION W/PHACO Left 09/29/2023   Procedure: CATARACT EXTRACTION PHACO AND INTRAOCULAR LENS PLACEMENT (IOC) LEFT DIABETIC KAHOOK DUAL BLADE GONIOTOMY 13.75 01:00.5;  Surgeon: Lockie Mola, MD;  Location: [facility];  Service: Ophthalmology;  Laterality: Left;   CATARACT EXTRACTION W/PHACO Right  10/13/2023   Procedure: CATARACT EXTRACTION PHACO AND INTRAOCULAR LENS PLACEMENT (IOC) RIGHT DIABETIC KAHOOK DUAL BLADE GONIOTOMY 13.19 00:53.8;  Surgeon: Lockie Mola, MD;  Location: [facility];  Service: Ophthalmology;  Laterality: Right;   CHOLECYSTECTOMY     ESOPHAGOGASTRODUODENOSCOPY (EGD) WITH PROPOFOL N/A 05/03/2017   Procedure: ESOPHAGOGASTRODUODENOSCOPY (EGD) WITH PROPOFOL;  Surgeon: Midge Minium, MD;  Location: [facility];  Service: Endoscopy;  Laterality: N/A;   ESOPHAGOGASTRODUODENOSCOPY (EGD) WITH PROPOFOL N/A 11/26/2022   Procedure: ESOPHAGOGASTRODUODENOSCOPY (EGD) WITH PROPOFOL;  Surgeon: Midge Minium, MD;  Location: [facility];  Service: Endoscopy;  Laterality: N/A;   KNEE SURGERY     Right    ORIF HIP FRACTURE Left 08/12/2014   Dr. Ernest Pine   THORACOTOMY     thoracic aorta repair    TRACHEOSTOMY     s/p reversal   VIDEO BRONCHOSCOPY Bilateral 05/27/2017   Procedure: VIDEO BRONCHOSCOPY WITHOUT FLUORO;  Surgeon: Lupita Leash, MD;  Location: [facility];  Service: Cardiopulmonary;  Laterality: Bilateral;   Family History  Problem Relation Age of  Onset   Dementia Mother 30   Heart attack Father 42   Diabetes Brother 45   Heart attack Brother    Diabetes Brother    Colon cancer Neg Hx    Stomach cancer Neg Hx    Esophageal cancer Neg Hx    Rectal cancer Neg Hx    Social History   Socioeconomic History   Marital status: Married    Spouse name: Not on file   Number of children: Not on file   Years of education: Not on file   Highest education level: Not on file  Occupational History   Occupation: Build in Dealer: self employed  Tobacco Use   Smoking status: Former    Current packs/day: 0.00    Average packs/day: 1.5 packs/day for 20.0 years (30.0 ttl pk-yrs)    Types: Cigarettes    Start date: 09/13/1971    Quit date: 09/13/1991    Years since quitting: 32.3   Smokeless tobacco: Never   Tobacco comments:    quit  post MI  Vaping Use   Vaping status: Never Used  Substance and Sexual Activity   Alcohol use: Not Currently    Comment: 1-2 cansof beer per month   Drug use: No   Sexual activity: Not on file  Other Topics Concern   Not on file  Social History Narrative   No regular exercise    Social Drivers of Health   Financial Resource Strain: Low Risk  (01/19/2024)   Overall Financial Resource Strain (CARDIA)    Difficulty of Paying Living Expenses: Not hard at all  Food Insecurity: No Food Insecurity (01/19/2024)   Hunger Vital Sign    Worried About Running Out of Food in the Last Year: Never true    Ran Out of Food in the Last Year: Never true  Transportation Needs: No Transportation Needs (01/19/2024)   PRAPARE - Administrator, Civil Service (Medical): No    Lack of Transportation (Non-Medical): No  Physical Activity: Inactive (01/19/2024)   Exercise Vital Sign    Days of Exercise per Week: 0 days    Minutes of Exercise per Session: 0 min  Stress: No Stress Concern Present (01/19/2024)   [employer] of Occupational Health - Occupational Stress Questionnaire    Feeling of Stress : Not at all  Social Connections: Moderately Integrated (01/19/2024)   Social Connection and Isolation Panel [NHANES]    Frequency of Communication with Friends and Family: Once a week    Frequency of Social Gatherings with Friends and Family: Never    Attends Religious Services: More than 4 times per year    Active Member of [employer] or Organizations: Yes    Attends Banker Meetings: 1 to 4 times per year    Marital Status: Married   Tobacco Counseling Counseling given: Not Answered Tobacco comments: quit post MI   Clinical Intake:  Pre-visit preparation completed: Yes  Pain : No/denies pain    BMI - recorded: 27.98 Nutritional Status: BMI 25 -29 Overweight Nutritional Risks: None Diabetes: No  How often do you need to have someone help you when you read instructions,  pamphlets, or other written materials from your doctor or pharmacy?: 1 - Never  Interpreter Needed?: No  Comments: lives with wife Information entered by :: B.Saw Mendenhall,LPN   Activities of Daily Living    01/18/2024    6:23 PM 10/13/2023    6:41 AM  In your present state of health,  do you have any difficulty performing the following activities:  Hearing? 0 0  Vision? 0 0  Difficulty concentrating or making decisions? 1 0  Walking or climbing stairs? 0   Dressing or bathing? 0   Doing errands, shopping? 0   Preparing Food and eating ? N   Using the Toilet? N   In the past six months, have you accidently leaked urine? N   Do you have problems with loss of bowel control? N   Managing your Medications? N   Managing your Finances? N   Housekeeping or managing your Housekeeping? N     Patient Care Team: Hannah Beat, MD as PCP - General (Family Medicine) Bufford Buttner, MD as Consulting Physician (Dermatology)  Indicate any recent Medical Services you may have received from other than [facility] providers in the past year (date may be approximate).     Assessment:   This is a routine wellness examination for [place].  Hearing/Vision screen Hearing Screening - Comments:: Pt says his hearing is good Vision Screening - Comments:: Pt says his vision is good;readers only Dr Inez Pilgrim   Goals Addressed             This Visit's Progress    COMPLETED: DIET - INCREASE WATER INTAKE       Starting 02/08/2019, I will continue to drink at least 1 gallon of water daily.      COMPLETED: Patient Stated   On track    06/04/2020, I will maintain and continue medications as prescribed.      Patient Stated   On track    01/19/24-Would like to maintain current routine      COMPLETED: Patient Stated       01/15/2023, wants to continue losing weight       Depression Screen    01/19/2024   10:56 AM 01/15/2023    8:45 AM 01/14/2022    9:52 AM 06/04/2020   10:36 AM 02/08/2019   10:53 AM  02/01/2018   10:35 AM 01/27/2017    8:10 AM  PHQ 2/9 Scores  PHQ - 2 Score 0 0 0 0 0 0 0  PHQ- 9 Score    0 0 0     Fall Risk    01/18/2024    6:23 PM 01/15/2023    8:45 AM 01/11/2023   12:23 PM 01/14/2022    9:50 AM 06/04/2020   10:35 AM  Fall Risk   Falls in the past year? 0 0 0 0 0  Number falls in past yr:  0 0 0 0  Injury with Fall?  0 0 0 0  Risk for fall due to : No Fall Risks Medication side effect  No Fall Risks Medication side effect  Follow up Education provided;Falls prevention discussed Falls prevention discussed;Education provided;Falls evaluation completed  Falls prevention discussed Falls evaluation completed;Falls prevention discussed    MEDICARE RISK AT HOME: Medicare Risk at Home Any stairs in or around the home?: (Patient-Rptd) Yes If so, are there any without handrails?: (Patient-Rptd) No Home free of loose throw rugs in walkways, pet beds, electrical cords, etc?: (Patient-Rptd) Yes Adequate lighting in your home to reduce risk of falls?: (Patient-Rptd) Yes Life alert?: (Patient-Rptd) No Use of a cane, walker or w/c?: (Patient-Rptd) No Grab bars in the bathroom?: (Patient-Rptd) No Shower chair or bench in shower?: (Patient-Rptd) No Elevated toilet seat or a handicapped toilet?: (Patient-Rptd) No  TIMED UP AND GO:  Was the test performed?  No    Cognitive  Function:    06/04/2020   10:37 AM 02/08/2019   10:54 AM 02/01/2018   11:04 AM  MMSE - Mini Mental State Exam  Orientation to time 5 5 5   Orientation to Place 5 5 5   Registration 3 3 3   Attention/ Calculation 5 0 0  Recall 3 3 3   Language- name 2 objects  0 0  Language- repeat 1 1 1   Language- follow 3 step command  3 2  Language- follow 3 step command-comments   unable to follow 1 step of 3 step command  Language- read & follow direction  0 0  Write a sentence  0 0  Copy design  0 0  Total score  20 19        01/19/2024   11:02 AM 01/15/2023    8:46 AM  6CIT Screen  What Year? 0 points 0 points   What month? 0 points 0 points  What time? 0 points 0 points  Count back from 20 0 points 0 points  Months in reverse 0 points 4 points  Repeat phrase 2 points 4 points  Total Score 2 points 8 points    Immunizations Immunization History  Administered Date(s) Administered   Fluad Quad(high Dose 65+) 09/14/2019, 11/27/2020, 09/02/2022   Influenza Split 10/04/2012   Influenza Whole 08/16/2008, 08/01/2014   Influenza, High Dose Seasonal PF 09/16/2017   Influenza,inj,Quad PF,6+ Mos 10/11/2013, 10/18/2015, 08/24/2016, 09/29/2018   PFIZER(Purple Top)SARS-COV-2 Vaccination 01/08/2020, 02/01/2020   Pneumococcal Conjugate-13 11/18/2015   Pneumococcal Polysaccharide-23 06/13/2012   Td 07/01/2015    TDAP status: Up to date  Flu Vaccine status: Due, Education has been provided regarding the importance of this vaccine. Advised may receive this vaccine at local pharmacy or Health Dept. Aware to provide a copy of the vaccination record if obtained from local pharmacy or Health Dept. Verbalized acceptance and understanding.  Pneumococcal vaccine status: Up to date  Covid-19 vaccine status: Completed vaccines  Qualifies for Shingles Vaccine? Yes   Zostavax completed No   Shingrix Completed?: No.    Education has been provided regarding the importance of this vaccine. Patient has been advised to call insurance company to determine out of pocket expense if they have not yet received this vaccine. Advised may also receive vaccine at local pharmacy or Health Dept. Verbalized acceptance and understanding.  Screening Tests Health Maintenance  Topic Date Due   Zoster Vaccines- Shingrix (1 of 2) Never done   COVID-19 Vaccine (3 - Pfizer risk series) 02/29/2020   HEMOGLOBIN A1C  10/14/2023   INFLUENZA VACCINE  02/28/2024 (Originally 07/01/2023)   Diabetic kidney evaluation - eGFR measurement  04/12/2024   Diabetic kidney evaluation - Urine ACR  04/18/2024   FOOT EXAM  04/18/2024   OPHTHALMOLOGY EXAM   08/29/2024   Medicare Annual Wellness (AWV)  01/18/2025   DTaP/Tdap/Td (2 - Tdap) 06/30/2025   Pneumonia Vaccine 89+ Years old  Completed   Hepatitis C Screening  Completed   HPV VACCINES  Aged Out   Colonoscopy  Discontinued    Health Maintenance  Health Maintenance Due  Topic Date Due   Zoster Vaccines- Shingrix (1 of 2) Never done   COVID-19 Vaccine (3 - Pfizer risk series) 02/29/2020   HEMOGLOBIN A1C  10/14/2023    Colorectal cancer screening: No longer required.   Lung Cancer Screening: (Low Dose CT Chest recommended if Age 89-80 years, 20 pack-year currently smoking OR have quit w/in 15years.) does not qualify.   Lung Cancer Screening Referral: no  Additional Screening:  Hepatitis C Screening: does not qualify; Completed 01/27/2017  Vision Screening: Recommended annual ophthalmology exams for early detection of glaucoma and other disorders of the eye. Is the patient up to date with their annual eye exam?  Yes  Who is the provider or what is the name of the office in which the patient attends annual eye exams? Dr Inez Pilgrim If pt is not established with a provider, would they like to be referred to a provider to establish care? No .   Dental Screening: Recommended annual dental exams for proper oral hygiene  Diabetic Foot Exam: n/a  Community Resource Referral / Chronic Care Management: CRR required this visit?  No   CCM required this visit?  No Pt declined appt w/PCP.[name] Kitchensays will call back     Plan:     I have personally reviewed and noted the following in the patient's chart:   Medical and social history Use of alcohol, tobacco or illicit drugs  Current medications and supplements including opioid prescriptions. Patient is not currently taking opioid prescriptions. Functional ability and status Nutritional status Physical activity Advanced directives List of other physicians Hospitalizations, surgeries, and ER visits in previous 12  months Vitals Screenings to include cognitive, depression, and falls Referrals and appointments  In addition, I have reviewed and discussed with patient certain preventive protocols, quality metrics, and best practice recommendations. A written personalized care plan for preventive services as well as general preventive health recommendations were provided to patient.    Sue Lush, LPN   07/28/5620   After Visit Summary: (MyChart) Due to this being a telephonic visit, the after visit summary with patients personalized plan was offered to patient via MyChart   Nurse Notes: The patient states he is doing well and has no concerns or questions at this time.

## 2024-02-03 DIAGNOSIS — H401123 Primary open-angle glaucoma, left eye, severe stage: Secondary | ICD-10-CM | POA: Diagnosis not present

## 2024-02-03 DIAGNOSIS — Z961 Presence of intraocular lens: Secondary | ICD-10-CM | POA: Diagnosis not present

## 2024-02-03 DIAGNOSIS — H401112 Primary open-angle glaucoma, right eye, moderate stage: Secondary | ICD-10-CM | POA: Diagnosis not present

## 2024-02-11 DIAGNOSIS — H401112 Primary open-angle glaucoma, right eye, moderate stage: Secondary | ICD-10-CM | POA: Diagnosis not present

## 2024-03-03 ENCOUNTER — Encounter: Payer: Self-pay | Admitting: Pulmonary Disease

## 2024-03-03 ENCOUNTER — Ambulatory Visit
Admission: RE | Admit: 2024-03-03 | Discharge: 2024-03-03 | Disposition: A | Discharge status: Home / Self Care | Source: Ambulatory Visit | Attending: Family Medicine | Admitting: Family Medicine

## 2024-03-03 DIAGNOSIS — R918 Other nonspecific abnormal finding of lung field: Secondary | ICD-10-CM | POA: Diagnosis not present

## 2024-03-03 DIAGNOSIS — J439 Emphysema, unspecified: Secondary | ICD-10-CM | POA: Diagnosis present

## 2024-03-03 DIAGNOSIS — J432 Centrilobular emphysema: Secondary | ICD-10-CM | POA: Diagnosis not present

## 2024-03-03 DIAGNOSIS — J4489 Other specified chronic obstructive pulmonary disease: Secondary | ICD-10-CM | POA: Insufficient documentation

## 2024-03-21 DIAGNOSIS — Z961 Presence of intraocular lens: Secondary | ICD-10-CM | POA: Diagnosis not present

## 2024-03-21 DIAGNOSIS — H401112 Primary open-angle glaucoma, right eye, moderate stage: Secondary | ICD-10-CM | POA: Diagnosis not present

## 2024-03-21 DIAGNOSIS — H401123 Primary open-angle glaucoma, left eye, severe stage: Secondary | ICD-10-CM | POA: Diagnosis not present

## 2024-04-06 ENCOUNTER — Other Ambulatory Visit: Payer: Self-pay | Admitting: Family Medicine

## 2024-04-06 DIAGNOSIS — F0280 Dementia in other diseases classified elsewhere without behavioral disturbance: Secondary | ICD-10-CM

## 2024-04-06 NOTE — Telephone Encounter (Signed)
 Spoke to pt's wife, scheduled cpe for 05/16/24

## 2024-04-06 NOTE — Telephone Encounter (Signed)
 Please schedule CPE with fasting labs prior with Dr. Geralyn Knee after 04/18/24

## 2024-04-12 ENCOUNTER — Telehealth: Payer: Self-pay | Admitting: Family Medicine

## 2024-04-12 NOTE — Telephone Encounter (Signed)
 Error

## 2024-04-25 ENCOUNTER — Telehealth: Payer: Self-pay | Admitting: *Deleted

## 2024-04-25 DIAGNOSIS — Z79899 Other long term (current) drug therapy: Secondary | ICD-10-CM

## 2024-04-25 DIAGNOSIS — Z125 Encounter for screening for malignant neoplasm of prostate: Secondary | ICD-10-CM

## 2024-04-25 DIAGNOSIS — E785 Hyperlipidemia, unspecified: Secondary | ICD-10-CM

## 2024-04-25 DIAGNOSIS — E1151 Type 2 diabetes mellitus with diabetic peripheral angiopathy without gangrene: Secondary | ICD-10-CM

## 2024-04-25 NOTE — Telephone Encounter (Signed)
-----   Message from Gerry Krone sent at 04/25/2024 10:40 AM EDT ----- Regarding: Lab orders for Mon, 6.9.25 Patient is scheduled for CPX labs, please order future labs, Thanks , Anselmo Kings

## 2024-05-01 DIAGNOSIS — H401112 Primary open-angle glaucoma, right eye, moderate stage: Secondary | ICD-10-CM | POA: Diagnosis not present

## 2024-05-01 DIAGNOSIS — H401123 Primary open-angle glaucoma, left eye, severe stage: Secondary | ICD-10-CM | POA: Diagnosis not present

## 2024-05-01 DIAGNOSIS — Z961 Presence of intraocular lens: Secondary | ICD-10-CM | POA: Diagnosis not present

## 2024-05-08 ENCOUNTER — Other Ambulatory Visit (INDEPENDENT_AMBULATORY_CARE_PROVIDER_SITE_OTHER)

## 2024-05-08 DIAGNOSIS — E1151 Type 2 diabetes mellitus with diabetic peripheral angiopathy without gangrene: Secondary | ICD-10-CM | POA: Diagnosis not present

## 2024-05-08 DIAGNOSIS — Z79899 Other long term (current) drug therapy: Secondary | ICD-10-CM

## 2024-05-08 DIAGNOSIS — E785 Hyperlipidemia, unspecified: Secondary | ICD-10-CM

## 2024-05-08 DIAGNOSIS — Z125 Encounter for screening for malignant neoplasm of prostate: Secondary | ICD-10-CM

## 2024-05-08 LAB — BASIC METABOLIC PANEL WITH GFR
BUN: 17 mg/dL (ref 6–23)
CO2: 24 meq/L (ref 19–32)
Calcium: 9.6 mg/dL (ref 8.4–10.5)
Chloride: 108 meq/L (ref 96–112)
Creatinine, Ser: 1.01 mg/dL (ref 0.40–1.50)
GFR: 72.01 mL/min
Glucose, Bld: 119 mg/dL — ABNORMAL HIGH (ref 70–99)
Potassium: 4.6 meq/L (ref 3.5–5.1)
Sodium: 140 meq/L (ref 135–145)

## 2024-05-08 LAB — CBC WITH DIFFERENTIAL/PLATELET
Basophils Absolute: 0.1 10*3/uL (ref 0.0–0.1)
Basophils Relative: 1.1 % (ref 0.0–3.0)
Eosinophils Absolute: 0.2 10*3/uL (ref 0.0–0.7)
Eosinophils Relative: 2.6 % (ref 0.0–5.0)
HCT: 42.5 % (ref 39.0–52.0)
Hemoglobin: 14.5 g/dL (ref 13.0–17.0)
Lymphocytes Relative: 21.2 % (ref 12.0–46.0)
Lymphs Abs: 1.4 10*3/uL (ref 0.7–4.0)
MCHC: 34.3 g/dL (ref 30.0–36.0)
MCV: 87.2 fl (ref 78.0–100.0)
Monocytes Absolute: 0.5 10*3/uL (ref 0.1–1.0)
Monocytes Relative: 7.5 % (ref 3.0–12.0)
Neutro Abs: 4.6 10*3/uL (ref 1.4–7.7)
Neutrophils Relative %: 67.6 % (ref 43.0–77.0)
Platelets: 187 10*3/uL (ref 150.0–400.0)
RBC: 4.87 Mil/uL (ref 4.22–5.81)
RDW: 13.4 % (ref 11.5–15.5)
WBC: 6.8 10*3/uL (ref 4.0–10.5)

## 2024-05-08 LAB — MICROALBUMIN / CREATININE URINE RATIO
Creatinine,U: 96.1 mg/dL
Microalb Creat Ratio: 96.2 mg/g — ABNORMAL HIGH (ref 0.0–30.0)
Microalb, Ur: 9.2 mg/dL — ABNORMAL HIGH (ref 0.0–1.9)

## 2024-05-08 LAB — LIPID PANEL
Cholesterol: 117 mg/dL (ref 0–200)
HDL: 40.1 mg/dL
LDL Cholesterol: 57 mg/dL (ref 0–99)
NonHDL: 76.78
Total CHOL/HDL Ratio: 3
Triglycerides: 100 mg/dL (ref 0.0–149.0)
VLDL: 20 mg/dL (ref 0.0–40.0)

## 2024-05-08 LAB — HEPATIC FUNCTION PANEL
ALT: 16 U/L (ref 0–53)
AST: 14 U/L (ref 0–37)
Albumin: 4.8 g/dL (ref 3.5–5.2)
Alkaline Phosphatase: 82 U/L (ref 39–117)
Bilirubin, Direct: 0.3 mg/dL (ref 0.0–0.3)
Total Bilirubin: 1.5 mg/dL — ABNORMAL HIGH (ref 0.2–1.2)
Total Protein: 6.8 g/dL (ref 6.0–8.3)

## 2024-05-08 LAB — HEMOGLOBIN A1C: Hgb A1c MFr Bld: 6.2 % (ref 4.6–6.5)

## 2024-05-11 LAB — PSA, MEDICARE: PSA: 0.98 ng/mL (ref 0.10–4.00)

## 2024-05-15 ENCOUNTER — Encounter: Admitting: Family Medicine

## 2024-05-23 NOTE — Progress Notes (Signed)
 Abem Shaddix T. Daniell Paradise, MD, CAQ Sports Medicine [facility] at [facility] [address] [facility] [place], [address]  Phone: [phone]  FAX: [phone]  Rexton Greulich Puzio - 78 y.o. male  MRN [id number]  Date of Birth: Nov 11, 1946  Date: 05/25/2024  PCP: Watt Mirza, MD  Referral: Watt Mirza, MD  Chief Complaint  Patient presents with   Annual Exam    Part 2   Patient Care Team: Watt Mirza, MD as PCP - General (Family Medicine) Court Pulling, MD as Consulting Physician (Dermatology) Subjective:   Legion Discher Kasson is a 78 y.o. pleasant patient who presents with the following:  Preventative Health Maintenance Visit:  Health Maintenance Summary Reviewed and updated, unless pt declines services.  Tobacco History Reviewed. Alcohol: No concerns, no excessive use Exercise Habits: Some activity, rec at least 30 mins 5 times a week -very little STD concerns: no risk or activity to increase risk Drug Use: None  Shingrix COVID booster Foot exam Flu booster  He is a pleasant gentleman with complicated medical history.  History of MI, coronary disease, chronic diastolic heart failure. Sees Dr. Gollan Currently on amlodipine , aspirin  81 mg, Lipitor  History of chronic COPD and emphysema. He does see pulmonology Currently on Trelegy, which he thinks has helped significantly  History of mild dementia, on Aricept  He has not gotten lost, [place] he and his wife both acknowledge that he is having issues with his short-term memory, and thinks that this is somewhat worsened  He also has a history of diabetes and is currently on metformin  1500 mg a day.  Multiple skin lesions History of multiple skin cancers, prior Mohs surgery.  No recent visit to dermatology.  Health Maintenance  Topic Date Due   Zoster Vaccines- Shingrix (1 of 2) Never done   COVID-19 Vaccine (3 - Pfizer risk series) 02/29/2020   INFLUENZA VACCINE  06/30/2024   OPHTHALMOLOGY  EXAM  08/29/2024   HEMOGLOBIN A1C  11/07/2024   Medicare Annual Wellness (AWV)  01/18/2025   Diabetic kidney evaluation - eGFR measurement  05/08/2025   Diabetic kidney evaluation - Urine ACR  05/08/2025   FOOT EXAM  05/25/2025   DTaP/Tdap/Td (2 - Tdap) 06/30/2025   Pneumococcal Vaccine: 50+ Years  Completed   Hepatitis C Screening  Completed   Hepatitis B Vaccines  Aged Out   HPV VACCINES  Aged Out   Meningococcal B Vaccine  Aged Out   Colonoscopy  Discontinued   Immunization History  Administered Date(s) Administered   Fluad Quad(high Dose 65+) 09/14/2019, 11/27/2020, 09/02/2022   Influenza Split 10/04/2012   Influenza Whole 08/16/2008, 08/01/2014   Influenza, High Dose Seasonal PF 09/16/2017   Influenza,inj,Quad PF,6+ Mos 10/11/2013, 10/18/2015, 08/24/2016, 09/29/2018   PFIZER(Purple Top)SARS-COV-2 Vaccination 01/08/2020, 02/01/2020   Pneumococcal Conjugate-13 11/18/2015   Pneumococcal Polysaccharide-23 06/13/2012   Td 07/01/2015   Patient Active Problem List   Diagnosis Date Noted   Chronic diastolic CHF (congestive heart failure) (HCC) 02/22/2021    Priority: High   Coronary artery disease of native artery of native heart with stable angina pectoris (HCC) 08/18/2017    Priority: High   Centrilobular emphysema (HCC) 09/12/2014    Priority: High   History of MI (myocardial infarction)     Priority: High   DM (diabetes mellitus) type II, controlled, with peripheral vascular disorder (HCC) 02/13/2010    Priority: High   Dementia 08/17/2022    Priority: Medium    OSA (obstructive sleep apnea) 09/15/2017  Priority: Medium    Hyperlipidemia LDL goal <70 08/16/2008    Priority: Medium    Essential hypertension 08/16/2008    Priority: Medium    Esophageal dysphagia 11/26/2022   Aortic transection    Peripheral vascular disease (HCC)    Duodenal ulcer with hemorrhage 04/30/2011   Major depressive disorder, recurrent, in remission (HCC) 08/16/2008    Past Medical  History:  Diagnosis Date   Aortic transection 2005   Traumatic after a fall from a second floor. s/p repair at [facility]   Basal cell carcinoma    CAD (coronary artery disease) 1992   a. 1992 Acute anterior MI, thrombolytic therapy-->cath reportedly w/o significant CAD; b. 08/2016 MV: EF 57%, hypertensive respons, no ischemia/infarct; c. 04/2017 CTA chest w/ coronary Ca2+.   COPD (chronic obstructive pulmonary disease) (HCC)    Depression    Diastolic dysfunction    a. 04/2017 Echo: EF 60-65%, Gr1 DD, nl RV fxn.   Erectile dysfunction    Fall 2005   fell off house: torn aorta, clavicle fracture, rib fracture, vertebral fractures, lung contusion, coma x 2 weeks   Fracture of femoral neck, left (HCC) 08/09/2014   ORIF, Dr. Mardee   Fracture of radial neck, left, closed 08/09/2014   Gallstone pancreatitis    Gastric ulcer    Grade I diastolic dysfunction    History of ATN 08/09/2014   [facility], 2 days of dialysis (ARF)   Hyperlipidemia    Statin with joint pain    Hypertension    Obesity    Peripheral vascular disease (HCC)    Atherosclerotic:R renal artery stenosis   Type II diabetes mellitus (HCC)    Upper GI bleed    a. 04/2017 admit w/ melena and HGB down to 7 req prbc's; b. 04/2017 EGD: small HH, non-bleeding gastric & duod ulcers, non-bleeding erosive gastropathy-->PPI Rx.   Wears dentures    full upper and lower    Past Surgical History:  Procedure Laterality Date   CARDIAC CATHETERIZATION  08/1991   50 % mid-Lad stenosis with clot, 25-505 second marginal   CARDIOVASCULAR SURGERY     with ruptured Aorta, Dr. Camellia Larsen, [facility]    CATARACT EXTRACTION W/PHACO Left 09/29/2023   Procedure: CATARACT EXTRACTION PHACO AND INTRAOCULAR LENS PLACEMENT (IOC) LEFT DIABETIC KAHOOK DUAL BLADE GONIOTOMY 13.75 01:00.5;  Surgeon: Mittie Gaskin, MD;  Location: [facility];  Service: Ophthalmology;  Laterality: Left;   CATARACT EXTRACTION W/PHACO Right 10/13/2023   Procedure: CATARACT  EXTRACTION PHACO AND INTRAOCULAR LENS PLACEMENT (IOC) RIGHT DIABETIC KAHOOK DUAL BLADE GONIOTOMY 13.19 00:53.8;  Surgeon: Mittie Gaskin, MD;  Location: [facility];  Service: Ophthalmology;  Laterality: Right;   CHOLECYSTECTOMY     ESOPHAGOGASTRODUODENOSCOPY (EGD) WITH PROPOFOL  N/A 05/03/2017   Procedure: ESOPHAGOGASTRODUODENOSCOPY (EGD) WITH PROPOFOL ;  Surgeon: Jinny Carmine, MD;  Location: [facility];  Service: Endoscopy;  Laterality: N/A;   ESOPHAGOGASTRODUODENOSCOPY (EGD) WITH PROPOFOL  N/A 11/26/2022   Procedure: ESOPHAGOGASTRODUODENOSCOPY (EGD) WITH PROPOFOL ;  Surgeon: Jinny Carmine, MD;  Location: [facility];  Service: Endoscopy;  Laterality: N/A;   KNEE SURGERY     Right    ORIF HIP FRACTURE Left 08/12/2014   Dr. Mardee   THORACOTOMY     thoracic aorta repair    TRACHEOSTOMY     s/p reversal   VIDEO BRONCHOSCOPY Bilateral 05/27/2017   Procedure: VIDEO BRONCHOSCOPY WITHOUT FLUORO;  Surgeon: Alaine Vicenta NOVAK, MD;  Location: [facility];  Service: Cardiopulmonary;  Laterality: Bilateral;    Family History  Problem Relation  Age of Onset   Dementia Mother 59   Heart attack Father 1   Diabetes Brother 105   Heart attack Brother    Diabetes Brother    Colon cancer Neg Hx    Stomach cancer Neg Hx    Esophageal cancer Neg Hx    Rectal cancer Neg Hx     Social History   Social History Narrative   No regular exercise     Past Medical History, Surgical History, Social History, Family History, Problem List, Medications, and Allergies have been reviewed and updated if relevant.  Review of Systems: Pertinent positives are listed above.  Otherwise, a full 14 point review of systems has been done in full and it is negative except where it is noted positive.  Objective:   BP 116/60   Pulse (!) 58   Temp 98.2 F (36.8 C) (Temporal)   Ht 5' 7.5 (1.715 m)   Wt 200 lb 6 oz (90.9 kg)   SpO2 94%   BMI 30.92 kg/m  Ideal Body Weight: Weight in (lb) to have  BMI = 25: 161.7  Ideal Body Weight: Weight in (lb) to have BMI = 25: 161.7 No results found.    05/25/2024    8:40 AM 01/19/2024   10:56 AM 01/15/2023    8:45 AM 01/14/2022    9:52 AM 06/04/2020   10:36 AM  Depression screen PHQ 2/9  Decreased Interest 0 0 0 0 0  Down, Depressed, Hopeless 0 0 0 0 0  PHQ - 2 Score 0 0 0 0 0  Altered sleeping     0  Tired, decreased energy     0  Change in appetite     0  Feeling bad or failure about yourself      0  Trouble concentrating     0  Moving slowly or fidgety/restless     0  Suicidal thoughts     0  PHQ-9 Score     0  Difficult doing work/chores     Not difficult at all     GEN: well developed, well nourished, no acute distress Eyes: conjunctiva and lids normal, PERRLA, EOMI ENT: TM clear, nares clear, oral exam WNL Neck: supple, no lymphadenopathy, no thyromegaly, no JVD Pulm: clear to auscultation and percussion, respiratory effort normal CV: regular rate and rhythm, S1-S2, no murmur, rub or gallop, no bruits, peripheral pulses normal and symmetric, no cyanosis, clubbing, edema or varicosities GI: soft, non-tender; no hepatosplenomegaly, masses; active bowel sounds all quadrants GU: deferred Lymph: no cervical, axillary or inguinal adenopathy MSK: gait normal, muscle tone and strength WNL, no joint swelling, effusions, discoloration, crepitus  SKIN: clear, good turgor, color WNL, no rashes, lesions, or ulcerations  Diffuse sun damage, multiple skin lesions, crusted, pearly lesions  Neuro: normal mental status, normal strength, sensation, and motion Psych: alert; oriented to person, place and time, normally interactive and not anxious or depressed in appearance.  All labs reviewed with patient. Results for orders placed or performed in visit on 05/08/24  PSA, Medicare   Collection Time: 05/08/24  8:12 AM  Result Value Ref Range   PSA 0.98 0.10 - 4.00 ng/ml  Lipid panel   Collection Time: 05/08/24  8:12 AM  Result Value Ref  Range   Cholesterol 117 0 - 200 mg/dL   Triglycerides 899.9 0.0 - 149.0 mg/dL   HDL 59.89 >60.99 mg/dL   VLDL 79.9 0.0 - 59.9 mg/dL   LDL Cholesterol 57 0 - 99 mg/dL  Total CHOL/HDL Ratio 3    NonHDL 76.78   Hemoglobin A1c   Collection Time: 05/08/24  8:12 AM  Result Value Ref Range   Hgb A1c MFr Bld 6.2 4.6 - 6.5 %  Hepatic function panel   Collection Time: 05/08/24  8:12 AM  Result Value Ref Range   Total Bilirubin 1.5 (H) 0.2 - 1.2 mg/dL   Bilirubin, Direct 0.3 0.0 - 0.3 mg/dL   Alkaline Phosphatase 82 39 - 117 U/L   AST 14 0 - 37 U/L   ALT 16 0 - 53 U/L   Total Protein 6.8 6.0 - 8.3 g/dL   Albumin 4.8 3.5 - 5.2 g/dL  CBC with Differential/Platelet   Collection Time: 05/08/24  8:12 AM  Result Value Ref Range   WBC 6.8 4.0 - 10.5 K/uL   RBC 4.87 4.22 - 5.81 Mil/uL   Hemoglobin 14.5 13.0 - 17.0 g/dL   HCT 57.4 60.9 - 47.9 %   MCV 87.2 78.0 - 100.0 fl   MCHC 34.3 30.0 - 36.0 g/dL   RDW 86.5 88.4 - 84.4 %   Platelets 187.0 150.0 - 400.0 K/uL   Neutrophils Relative % 67.6 43.0 - 77.0 %   Lymphocytes Relative 21.2 12.0 - 46.0 %   Monocytes Relative 7.5 3.0 - 12.0 %   Eosinophils Relative 2.6 0.0 - 5.0 %   Basophils Relative 1.1 0.0 - 3.0 %   Neutro Abs 4.6 1.4 - 7.7 K/uL   Lymphs Abs 1.4 0.7 - 4.0 K/uL   Monocytes Absolute 0.5 0.1 - 1.0 K/uL   Eosinophils Absolute 0.2 0.0 - 0.7 K/uL   Basophils Absolute 0.1 0.0 - 0.1 K/uL  Basic metabolic panel   Collection Time: 05/08/24  8:12 AM  Result Value Ref Range   Sodium 140 135 - 145 mEq/L   Potassium 4.6 3.5 - 5.1 mEq/L   Chloride 108 96 - 112 mEq/L   CO2 24 19 - 32 mEq/L   Glucose, Bld 119 (H) 70 - 99 mg/dL   BUN 17 6 - 23 mg/dL   Creatinine, Ser 8.98 0.40 - 1.50 mg/dL   GFR 27.98 >39.99 mL/min   Calcium  9.6 8.4 - 10.5 mg/dL  Microalbumin / creatinine urine ratio   Collection Time: 05/08/24  8:12 AM  Result Value Ref Range   Microalb, Ur 9.2 (H) 0.0 - 1.9 mg/dL   Creatinine,U 03.8 mg/dL   Microalb Creat Ratio 96.2  (H) 0.0 - 30.0 mg/g    Assessment and Plan:     ICD-10-CM   1. Healthcare maintenance  Z00.00     2. DM (diabetes mellitus) type II, controlled, with peripheral vascular disorder (HCC)  E11.51     3. Coronary artery disease of native artery of native heart with stable angina pectoris (HCC)  I25.118     4. Chronic diastolic CHF (congestive heart failure) (HCC)  I50.32     5. Centrilobular emphysema (HCC)  J43.2     6. Actinic keratoses  L57.0 Ambulatory referral to Dermatology    7. History of skin cancer  Z85.828 Ambulatory referral to Dermatology    8. Dementia in other diseases classified elsewhere, unspecified severity, without behavioral disturbance, psychotic disturbance, mood disturbance, and anxiety (HCC)  F02.80 donepezil  (ARICEPT ) 10 MG tablet     Diabetes stable He does have increased microalbumin to creatinine ratio  He will think about Shingrix vaccination  Mild dementia.  I am going to go ahead and increase Aricept .  Extensive sun damage.  He  has multiple areas that are at least AK's, possible skin cancer.  I am going to refer him to dermatology.  Health Maintenance Exam: The patient's preventative maintenance and recommended screening tests for an annual wellness exam were reviewed in full today. Brought up to date unless services declined.  Counselled on the importance of diet, exercise, and its role in overall health and mortality. The patient's FH and SH was reviewed, including their home life, tobacco status, and drug and alcohol status.  Follow-up in 1 year for physical exam or additional follow-up below.  Disposition: No follow-ups on file.  Meds ordered this encounter  Medications   lisinopril  (ZESTRIL ) 20 MG tablet    Sig: Take 1 tablet (20 mg total) by mouth daily.    Dispense:  90 tablet    Refill:  3   metFORMIN  (GLUCOPHAGE -XR) 500 MG 24 hr tablet    Sig: Take 3 tablets (1,500 mg total) by mouth daily with breakfast.    Dispense:  270 tablet     Refill:  1   donepezil  (ARICEPT ) 10 MG tablet    Sig: Take 1 tablet (10 mg total) by mouth at bedtime.    Dispense:  90 tablet    Refill:  1   Medications Discontinued During This Encounter  Medication Reason   Glycerin, PF, (OPTASE DRY EYE INTENSE) 0.2 % SOLN Completed Course   albuterol  (VENTOLIN  HFA) 108 (90 Base) MCG/ACT inhaler Duplicate   Glycerin, PF, (OPTASE DRY EYE INTENSE) 0.2 % SOLN Completed Course   latanoprost  (XALATAN ) 0.005 % ophthalmic solution Completed Course   metFORMIN  (GLUCOPHAGE -XR) 500 MG 24 hr tablet Dose change   latanoprost  (XALATAN ) 0.005 % ophthalmic solution Completed Course   metFORMIN  (GLUCOPHAGE -XR) 500 MG 24 hr tablet Dose change   amLODipine  (NORVASC ) 10 MG tablet    metFORMIN  (GLUCOPHAGE -XR) 500 MG 24 hr tablet Reorder   donepezil  (ARICEPT ) 5 MG tablet Reorder   Orders Placed This Encounter  Procedures   Ambulatory referral to Dermatology    Signed,  Jacques T. Travon Crochet, MD   Allergies as of 05/25/2024       Reactions   Contrast Media [iodinated Contrast Media] Hives   Penicillins Swelling, Other (See Comments)   08/05/22 Per MD: pt had severe rash and facial swelling with PCN   Xarelto [rivaroxaban] Other (See Comments)   GI Bleed   Clindamycin /lincomycin Rash   Dye Fdc Red [red Dye #40 (allura Red)] Rash   Lovenox  [enoxaparin  Sodium] Rash   Skin turned black at injection site        Medication List        Accurate as of May 25, 2024 11:59 PM. If you have any questions, ask your nurse or doctor.          STOP taking these medications    amLODipine  10 MG tablet Commonly known as: NORVASC  Stopped by: Jacques Dashanti Burr   latanoprost  0.005 % ophthalmic solution Commonly known as: XALATAN  Stopped by: Jacques Nasim Garofano   Optase Dry Eye Intense 0.2 % Soln Generic drug: Glycerin (PF) Stopped by: Jacques Erendida Wrenn       TAKE these medications    albuterol  108 (90 Base) MCG/ACT inhaler Commonly known as: VENTOLIN   HFA Inhale 2 puffs into the lungs every 6 (six) hours as needed for wheezing or shortness of breath.   aspirin  81 MG chewable tablet Chew 81 mg by mouth daily.   atorvastatin  40 MG tablet Commonly known as: LIPITOR TAKE ONE TABLET BY MOUTH EVERY DAY  brimonidine  0.2 % ophthalmic solution Commonly known as: ALPHAGAN  Place 1 drop into both eyes 2 (two) times daily.   citalopram  10 MG tablet Commonly known as: CELEXA  TAKE ONE TABLET BY MOUTH EVERY DAY   donepezil  10 MG tablet Commonly known as: ARICEPT  Take 1 tablet (10 mg total) by mouth at bedtime. What changed:  medication strength how much to take Changed by: Jacques Trea Carnegie   dorzolamide -timolol  2-0.5 % ophthalmic solution Commonly known as: COSOPT  1 drop 2 (two) times daily.   lisinopril  20 MG tablet Commonly known as: ZESTRIL  Take 1 tablet (20 mg total) by mouth daily. Started by: Jacques Jailen Lung   metFORMIN  500 MG 24 hr tablet Commonly known as: GLUCOPHAGE -XR Take 3 tablets (1,500 mg total) by mouth daily with breakfast. What changed:  how much to take when to take this Another medication with the same name was removed. Continue taking this medication, and follow the directions you see here. Changed by: Jacques Jenkins Risdon   nitroGLYCERIN  0.4 MG SL tablet Commonly known as: NITROSTAT  Place 1 tablet (0.4 mg total) under the tongue every 5 (five) minutes as needed for chest pain.   Trelegy Ellipta  100-62.5-25 MCG/ACT Aepb Generic drug: Fluticasone -Umeclidin-Vilant Inhale 1 puff into the lungs daily.

## 2024-05-25 ENCOUNTER — Encounter: Payer: Self-pay | Admitting: Family Medicine

## 2024-05-25 ENCOUNTER — Ambulatory Visit (INDEPENDENT_AMBULATORY_CARE_PROVIDER_SITE_OTHER): Admitting: Family Medicine

## 2024-05-25 VITALS — BP 116/60 | HR 58 | Temp 98.2°F | Ht 67.5 in | Wt 200.4 lb

## 2024-05-25 DIAGNOSIS — E1129 Type 2 diabetes mellitus with other diabetic kidney complication: Secondary | ICD-10-CM

## 2024-05-25 DIAGNOSIS — Z85828 Personal history of other malignant neoplasm of skin: Secondary | ICD-10-CM | POA: Diagnosis not present

## 2024-05-25 DIAGNOSIS — E1151 Type 2 diabetes mellitus with diabetic peripheral angiopathy without gangrene: Secondary | ICD-10-CM

## 2024-05-25 DIAGNOSIS — I5032 Chronic diastolic (congestive) heart failure: Secondary | ICD-10-CM

## 2024-05-25 DIAGNOSIS — Z0000 Encounter for general adult medical examination without abnormal findings: Secondary | ICD-10-CM

## 2024-05-25 DIAGNOSIS — F0280 Dementia in other diseases classified elsewhere without behavioral disturbance: Secondary | ICD-10-CM

## 2024-05-25 DIAGNOSIS — I25118 Atherosclerotic heart disease of native coronary artery with other forms of angina pectoris: Secondary | ICD-10-CM | POA: Diagnosis not present

## 2024-05-25 DIAGNOSIS — J432 Centrilobular emphysema: Secondary | ICD-10-CM | POA: Diagnosis not present

## 2024-05-25 DIAGNOSIS — L570 Actinic keratosis: Secondary | ICD-10-CM | POA: Diagnosis not present

## 2024-05-25 DIAGNOSIS — R809 Proteinuria, unspecified: Secondary | ICD-10-CM | POA: Diagnosis not present

## 2024-05-25 MED ORDER — METFORMIN HCL ER 500 MG PO TB24: 1500.0000 mg | ORAL_TABLET | Freq: Every day | ORAL | 1 refills | Status: DC

## 2024-05-25 MED ORDER — DONEPEZIL HCL 10 MG PO TABS: 10.0000 mg | ORAL_TABLET | Freq: Every day | ORAL | 1 refills | Status: DC

## 2024-05-25 MED ORDER — LISINOPRIL 20 MG PO TABS: 20.0000 mg | ORAL_TABLET | Freq: Every day | ORAL | 3 refills | Status: AC

## 2024-05-25 NOTE — Patient Instructions (Addendum)
 Consider Shingrix / shingles vaccine  Stop amlodipine  tablet  Restart Lisinopril  20 mg tablet once a day  Increase the Aricept  tablet to 10 mg each day

## 2024-05-26 ENCOUNTER — Encounter: Payer: Self-pay | Admitting: Family Medicine

## 2024-05-26 DIAGNOSIS — R809 Proteinuria, unspecified: Secondary | ICD-10-CM | POA: Insufficient documentation

## 2024-05-26 DIAGNOSIS — E1129 Type 2 diabetes mellitus with other diabetic kidney complication: Secondary | ICD-10-CM | POA: Insufficient documentation

## 2024-06-13 ENCOUNTER — Ambulatory Visit: Admitting: Pulmonary Disease

## 2024-06-13 DIAGNOSIS — Z961 Presence of intraocular lens: Secondary | ICD-10-CM | POA: Diagnosis not present

## 2024-06-13 DIAGNOSIS — H401123 Primary open-angle glaucoma, left eye, severe stage: Secondary | ICD-10-CM | POA: Diagnosis not present

## 2024-06-13 DIAGNOSIS — H401112 Primary open-angle glaucoma, right eye, moderate stage: Secondary | ICD-10-CM | POA: Diagnosis not present

## 2024-06-15 ENCOUNTER — Other Ambulatory Visit: Payer: Self-pay | Admitting: Family Medicine

## 2024-07-18 ENCOUNTER — Other Ambulatory Visit: Payer: Self-pay | Admitting: Pulmonary Disease

## 2024-08-08 ENCOUNTER — Ambulatory Visit: Admitting: Pulmonary Disease

## 2024-08-21 ENCOUNTER — Other Ambulatory Visit: Payer: Self-pay | Admitting: Family Medicine

## 2024-08-21 DIAGNOSIS — F0280 Dementia in other diseases classified elsewhere without behavioral disturbance: Secondary | ICD-10-CM

## 2024-09-05 DIAGNOSIS — H401123 Primary open-angle glaucoma, left eye, severe stage: Secondary | ICD-10-CM | POA: Diagnosis not present

## 2024-09-05 DIAGNOSIS — H401112 Primary open-angle glaucoma, right eye, moderate stage: Secondary | ICD-10-CM | POA: Diagnosis not present

## 2024-09-12 ENCOUNTER — Ambulatory Visit: Admitting: Cardiovascular Disease

## 2024-09-12 DIAGNOSIS — H401112 Primary open-angle glaucoma, right eye, moderate stage: Secondary | ICD-10-CM | POA: Diagnosis not present

## 2024-09-12 DIAGNOSIS — E119 Type 2 diabetes mellitus without complications: Secondary | ICD-10-CM | POA: Diagnosis not present

## 2024-09-12 DIAGNOSIS — Z961 Presence of intraocular lens: Secondary | ICD-10-CM | POA: Diagnosis not present

## 2024-09-12 DIAGNOSIS — H401123 Primary open-angle glaucoma, left eye, severe stage: Secondary | ICD-10-CM | POA: Diagnosis not present

## 2024-09-13 LAB — OPHTHALMOLOGY REPORT-SCANNED

## 2024-09-14 ENCOUNTER — Encounter: Payer: Self-pay | Admitting: Family Medicine

## 2024-09-18 ENCOUNTER — Other Ambulatory Visit: Payer: Self-pay | Admitting: Pulmonary Disease

## 2024-10-02 NOTE — Progress Notes (Unsigned)
 Date:  10/06/2024   ID:  [name], DOB 01-20-1946, MRN [id number]  Patient Location:  [address] [place] [address]   Provider location:   [facility], [place] office  PCP:  Watt Mirza, MD  Cardiologist:  Perla MOCCASIN [facility]  Chief Complaint  Patient presents with   12 month follow up     Denies chest pain or shortness of breath.     History of Present Illness:    [name] is a 78 y.o. male  past medical history of 77 year old male with  CAD anterior MI in 1992, treated with TPA.  Catheterization  did not show any significant coronary artery disease. [place] 2005 off house,  torn aorta, clavicle fracture, rib fracture, vertebral fx, lung contusion, coma x 2 wks hypertension,  hyperlipidemia,  Diabetes, obesity,  PUD Smoker, quit 1992 Chronic shortness of breath Who presents for routine follow-up of his coronary artery disease, shortness of breath   LOV 11/23 In follow-up today he reports doing well Walks the dog, no regular exercise program  Recent lab work reviewed Protein in urine, was changed from amlodipine  to lisinopril  20 daily Wife reports he has started having Dizzy spells x3, typically when getting up, one near syncope, he had to sit him back down as he seemed frozen Seemed to start after lisinopril  started He does not drink much water, symptoms may be better after she has started pushing more fluids Likes to drink coffee alone  Retired several years ago 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  No recent hospitalizations Hospital sept 2023: PNA, concern for aspiration  Echo 2018: Normal ejection fraction  Lab work reviewed A1C 6.2  Total cholesterol 117 LDL 57 on Lipitor 40 daily  Not using CPAP, was told not to use it by pulmonary No snoring, lost weight On Trelegy, breathing better  EKG personally reviewed by myself on todays visit EKG Interpretation Date/Time:  Friday October 06 2024 08:16:22 EST Ventricular Rate:  58 PR Interval:  270 QRS Duration:  88 QT Interval:  404 QTC Calculation: 396 R Axis:   2  Text Interpretation: Sinus bradycardia with 1st degree A-V block Inferior infarct , age undetermined When compared with ECG of 05-Aug-2022 06:01, Vent. rate has decreased BY  28 BPM Confirmed by Perla Lye [phone]) on 10/06/2024 8:25:44 AM    Lab Results  Component Value Date   CHOL 117 05/08/2024   HDL 40.10 05/08/2024   LDLCALC 57 05/08/2024   TRIG 100.0 05/08/2024    Prior records reviewed for today's visit  hospitalization  June 2018, melena and hemoptysis in the setting of bronchitis.   hemoglobin dropped to 7.  transfusion.   EGD by Dr. Jinny which showed gastric and duodenal nonbleeding ulcers and also erosive gastropathy.   PPI therapy.    CTA of the chest  significant contrast reaction  Admitted Troponin trend was flat at 0.20  0.16  0.16  normal LV function by echo   negative stress test  October 2017  for dyspnea on exertion.  Echocardiogram showed normal LV function with grade 1 diastolic dysfunction.   Stress testing in 08/2016 was normal with the exception of hypertensive response to exercise.    Past Medical History:  Diagnosis Date   Aortic transection 2005   Traumatic after a fall from a second floor. s/p repair at [facility]   Basal cell carcinoma    CAD (coronary artery disease) 1992   a.  1992 Acute anterior MI, thrombolytic therapy-->cath reportedly w/o significant CAD; b. 08/2016 MV: EF 57%, hypertensive respons, no ischemia/infarct; c. 04/2017 CTA chest w/ coronary Ca2+.   COPD (chronic obstructive pulmonary disease) (HCC)    Depression    Diastolic dysfunction    a. 04/2017 Echo: EF 60-65%, Gr1 DD, nl RV fxn.   Erectile dysfunction    Fall 2005   fell off house: torn aorta, clavicle fracture, rib fracture, vertebral fractures, lung contusion, coma x 2 weeks   Fracture of femoral neck, left (HCC) 08/09/2014   ORIF, Dr.  Mardee   Fracture of radial neck, left, closed 08/09/2014   Gallstone pancreatitis    Gastric ulcer    History of ATN 08/09/2014   [facility], 2 days of dialysis (ARF)   Hyperlipidemia    Statin with joint pain    Hypertension    Obesity    Peripheral vascular disease    Atherosclerotic:R renal artery stenosis   Type II diabetes mellitus (HCC)    Upper GI bleed    a. 04/2017 admit w/ melena and HGB down to 7 req prbc's; b. 04/2017 EGD: small HH, non-bleeding gastric & duod ulcers, non-bleeding erosive gastropathy-->PPI Rx.   Past Surgical History:  Procedure Laterality Date   CARDIAC CATHETERIZATION  08/1991   50 % mid-Lad stenosis with clot, 25-505 second marginal   CARDIOVASCULAR SURGERY     with ruptured Aorta, Dr. Camellia Larsen, [facility]    CATARACT EXTRACTION W/PHACO Left 09/29/2023   Procedure: CATARACT EXTRACTION PHACO AND INTRAOCULAR LENS PLACEMENT (IOC) LEFT DIABETIC KAHOOK DUAL BLADE GONIOTOMY 13.75 01:00.5;  Surgeon: Mittie Gaskin, MD;  Location: [facility];  Service: Ophthalmology;  Laterality: Left;   CATARACT EXTRACTION W/PHACO Right 10/13/2023   Procedure: CATARACT EXTRACTION PHACO AND INTRAOCULAR LENS PLACEMENT (IOC) RIGHT DIABETIC KAHOOK DUAL BLADE GONIOTOMY 13.19 00:53.8;  Surgeon: Mittie Gaskin, MD;  Location: [facility];  Service: Ophthalmology;  Laterality: Right;   CHOLECYSTECTOMY     ESOPHAGOGASTRODUODENOSCOPY (EGD) WITH PROPOFOL  N/A 05/03/2017   Procedure: ESOPHAGOGASTRODUODENOSCOPY (EGD) WITH PROPOFOL ;  Surgeon: Jinny Carmine, MD;  Location: [facility] END[facility];  Service: Endoscopy;  Laterality: N/A;   ESOPHAGOGASTRODUODENOSCOPY (EGD) WITH PROPOFOL  N/A 11/26/2022   Procedure: ESOPHAGOGASTRODUODENOSCOPY (EGD) WITH PROPOFOL ;  Surgeon: Jinny Carmine, MD;  Location: [facility] END[facility];  Service: Endoscopy;  Laterality: N/A;   KNEE SURGERY     Right    ORIF HIP FRACTURE Left 08/12/2014   Dr. Mardee   THORACOTOMY     thoracic aorta repair    TRACHEOSTOMY      s/p reversal   VIDEO BRONCHOSCOPY Bilateral 05/27/2017   Procedure: VIDEO BRONCHOSCOPY WITHOUT FLUORO;  Surgeon: Alaine Vicenta NOVAK, MD;  Location: [facility];  Service: Cardiopulmonary;  Laterality: Bilateral;     Current Meds  Medication Sig   albuterol  (VENTOLIN  HFA) 108 (90 Base) MCG/ACT inhaler Inhale 2 puffs into the lungs every 6 (six) hours as needed for wheezing or shortness of breath.   aspirin  81 MG chewable tablet Chew 81 mg by mouth daily.   atorvastatin  (LIPITOR) 40 MG tablet TAKE ONE TABLET BY MOUTH ONCE DAILY   brimonidine  (ALPHAGAN ) 0.2 % ophthalmic solution Place 1 drop into both eyes 2 (two) times daily.   citalopram  (CELEXA ) 10 MG tablet TAKE ONE TABLET BY MOUTH EVERY DAY   donepezil  (ARICEPT ) 10 MG tablet TAKE ONE TABLET BY MOUTH AT BEDTIME   dorzolamide -timolol  (COSOPT ) 22.3-6.8 MG/ML ophthalmic solution 1 drop 2 (two) times daily.   Fluticasone -Umeclidin-Vilant (TRELEGY ELLIPTA ) 100-62.5-25 MCG/ACT AEPB INHALE  1 PUFF INTO THE LUNGS ONCE DAILY   lisinopril  (ZESTRIL ) 20 MG tablet Take 1 tablet (20 mg total) by mouth daily.   metFORMIN  (GLUCOPHAGE -XR) 500 MG 24 hr tablet TAKE THREE TABLETS BY MOUTH ONCE DAILY WITH BREAKFAST   nitroGLYCERIN  (NITROSTAT ) 0.4 MG SL tablet Place 1 tablet (0.4 mg total) under the tongue every 5 (five) minutes as needed for chest pain.     Allergies:   Contrast media [iodinated contrast media], Penicillins, Xarelto [rivaroxaban], Clindamycin /lincomycin, Dye fdc red [red dye #40 (allura red)], and Lovenox  [enoxaparin  sodium]   Social History   Tobacco Use   Smoking status: Former    Current packs/day: 0.00    Average packs/day: 1.5 packs/day for 20.0 years (30.0 ttl pk-yrs)    Types: Cigarettes    Start date: 09/13/1971    Quit date: 09/13/1991    Years since quitting: 33.0   Smokeless tobacco: Never   Tobacco comments:    quit post MI  Vaping Use   Vaping status: Never Used  Substance Use Topics   Alcohol use: Not Currently     Comment: 1-2 cansof beer per month   Drug use: No     Family Hx: The patient's family history includes Dementia (age of onset: 48) in his mother; Diabetes in his brother; Diabetes (age of onset: 101) in his brother; Heart attack in his brother; Heart attack (age of onset: 69) in his father. There is no history of Colon cancer, Stomach cancer, Esophageal cancer, or Rectal cancer.  ROS:   Please see the history of present illness.    Review of Systems  Constitutional: Negative.   HENT: Negative.    Respiratory: Negative.    Cardiovascular: Negative.   Gastrointestinal: Negative.   Musculoskeletal: Negative.   Neurological: Negative.   Psychiatric/Behavioral: Negative.    All other systems reviewed and are negative.    Labs/Other Tests and Data Reviewed:    Recent Labs: 05/08/2024: ALT 16; BUN 17; Creatinine, Ser 1.01; Hemoglobin 14.5; Platelets 187.0; Potassium 4.6; Sodium 140   Recent Lipid Panel Lab Results  Component Value Date/Time   CHOL 117 05/08/2024 08:12 AM   CHOL 120 08/10/2014 09:28 PM   TRIG 100.0 05/08/2024 08:12 AM   TRIG 164 08/10/2014 09:28 PM   HDL 40.10 05/08/2024 08:12 AM   HDL 29 (L) 08/10/2014 09:28 PM   CHOLHDL 3 05/08/2024 08:12 AM   LDLCALC 57 05/08/2024 08:12 AM   LDLCALC 58 08/10/2014 09:28 PM   LDLDIRECT 173.5 05/23/2012 12:15 PM    Wt Readings from Last 3 Encounters:  10/06/24 197 lb 8 oz (89.6 kg)  05/25/24 200 lb 6 oz (90.9 kg)  01/19/24 195 lb (88.5 kg)     Exam:    Vital Signs: Vital signs may also be detailed in the HPI Ht 5' 8 (1.727 m)   Wt 197 lb 8 oz (89.6 kg)   BMI 30.03 kg/m   Constitutional:  oriented to person, place, and time. No distress.  HENT:  Head: Grossly normal Eyes:  no discharge. No scleral icterus.  Neck: No JVD, no carotid bruits  Cardiovascular: Regular rate and rhythm, no murmurs appreciated Pulmonary/Chest: Clear to auscultation bilaterally, no wheezes or rails Abdominal: Soft.  no distension.  no  tenderness.  Musculoskeletal: Normal range of motion Neurological:  normal muscle tone. Coordination normal. No atrophy Skin: Skin warm and dry Psychiatric: normal affect, pleasant   ASSESSMENT & PLAN:    Coronary artery disease of native artery of native heart with  stable angina pectoris Centrilobular emphysema (HCC) DM (diabetes mellitus) type II, controlled, with peripheral vascular disorder (HCC) Aortic transection, sequela Hyperlipidemia LDL goal <70 Dyspnea, unspecified type Essential hypertension  Coronary artery disease of native artery of native heart with stable angina pectoris (HCC) Currently with no symptoms of angina. No further workup at this time. Continue current medication regimen.  Centrilobular emphysema (HCC) pneumonia September 2023, recovered Off CPAP, no recent events, reports breathing is stable   Hyperlipidemia LDL goal <70 Cholesterol is at goal on the current lipid regimen. No changes to the medications were made.   DM (diabetes mellitus) type II, controlled, with peripheral vascular disorder (HCC) A1c well-controlled 6.2, wife controls his diet  Near syncope/lightheadedness Suspected orthostasis spells, recommend she check his pressures sitting and standing at home especially when he has episodes He could try lisinopril  10 in the morning and 10 at night rather than 20 in the morning Encouraged him to increase his fluid intake especially in the morning   Aortic transection, sequela  fell off a roof 2005   stable post surgery   Shortness of breath Secondary to COPD, deconditioning, obesity Walks the dog, no regular exercise Less likely ischemia     Signed, Evalene Lunger, MD  10/06/2024 8:15 AM    [facility] [address], [place], [place] [address]

## 2024-10-03 ENCOUNTER — Other Ambulatory Visit: Payer: Self-pay | Admitting: Family Medicine

## 2024-10-06 ENCOUNTER — Encounter: Payer: Self-pay | Admitting: Cardiovascular Disease

## 2024-10-06 ENCOUNTER — Ambulatory Visit: Attending: Cardiovascular Disease | Admitting: Cardiovascular Disease

## 2024-10-06 VITALS — BP 120/60 | HR 58 | Ht 68.0 in | Wt 197.5 lb

## 2024-10-06 DIAGNOSIS — G4733 Obstructive sleep apnea (adult) (pediatric): Secondary | ICD-10-CM | POA: Diagnosis not present

## 2024-10-06 DIAGNOSIS — E785 Hyperlipidemia, unspecified: Secondary | ICD-10-CM

## 2024-10-06 DIAGNOSIS — I739 Peripheral vascular disease, unspecified: Secondary | ICD-10-CM

## 2024-10-06 DIAGNOSIS — I25118 Atherosclerotic heart disease of native coronary artery with other forms of angina pectoris: Secondary | ICD-10-CM | POA: Diagnosis not present

## 2024-10-06 DIAGNOSIS — I5032 Chronic diastolic (congestive) heart failure: Secondary | ICD-10-CM | POA: Diagnosis not present

## 2024-10-06 DIAGNOSIS — S2509XS Other specified injury of thoracic aorta, sequela: Secondary | ICD-10-CM | POA: Diagnosis not present

## 2024-10-06 DIAGNOSIS — I10 Essential (primary) hypertension: Secondary | ICD-10-CM | POA: Diagnosis not present

## 2024-10-06 DIAGNOSIS — E1151 Type 2 diabetes mellitus with diabetic peripheral angiopathy without gangrene: Secondary | ICD-10-CM

## 2024-10-06 NOTE — Patient Instructions (Signed)

## 2025-01-19 ENCOUNTER — Ambulatory Visit: Payer: PPO
